# Patient Record
Sex: Female | Born: 1935 | Race: White | Hispanic: No | Smoking: Never smoker
Health system: Southern US, Community
[De-identification: ages and names within clinical notes are randomized; demographics above are authoritative.]

## PROBLEM LIST (undated history)

## (undated) DIAGNOSIS — J45909 Unspecified asthma, uncomplicated: Secondary | ICD-10-CM

## (undated) DIAGNOSIS — N8110 Cystocele, unspecified: Secondary | ICD-10-CM

## (undated) DIAGNOSIS — E119 Type 2 diabetes mellitus without complications: Secondary | ICD-10-CM

## (undated) DIAGNOSIS — H9190 Unspecified hearing loss, unspecified ear: Secondary | ICD-10-CM

## (undated) DIAGNOSIS — F32A Depression, unspecified: Secondary | ICD-10-CM

## (undated) DIAGNOSIS — E559 Vitamin D deficiency, unspecified: Secondary | ICD-10-CM

## (undated) DIAGNOSIS — F329 Major depressive disorder, single episode, unspecified: Secondary | ICD-10-CM

## (undated) DIAGNOSIS — I2510 Atherosclerotic heart disease of native coronary artery without angina pectoris: Secondary | ICD-10-CM

## (undated) DIAGNOSIS — K589 Irritable bowel syndrome without diarrhea: Secondary | ICD-10-CM

## (undated) DIAGNOSIS — T7840XA Allergy, unspecified, initial encounter: Secondary | ICD-10-CM

## (undated) DIAGNOSIS — I872 Venous insufficiency (chronic) (peripheral): Secondary | ICD-10-CM

## (undated) DIAGNOSIS — M1990 Unspecified osteoarthritis, unspecified site: Secondary | ICD-10-CM

## (undated) DIAGNOSIS — F411 Generalized anxiety disorder: Secondary | ICD-10-CM

## (undated) DIAGNOSIS — J309 Allergic rhinitis, unspecified: Secondary | ICD-10-CM

## (undated) DIAGNOSIS — I10 Essential (primary) hypertension: Secondary | ICD-10-CM

## (undated) DIAGNOSIS — E785 Hyperlipidemia, unspecified: Secondary | ICD-10-CM

## (undated) DIAGNOSIS — J42 Unspecified chronic bronchitis: Secondary | ICD-10-CM

## (undated) HISTORY — DX: Unspecified hearing loss, unspecified ear: H91.90

## (undated) HISTORY — DX: Unspecified chronic bronchitis: J42

## (undated) HISTORY — DX: Allergic rhinitis, unspecified: J30.9

## (undated) HISTORY — DX: Irritable bowel syndrome, unspecified: K58.9

## (undated) HISTORY — DX: Allergy, unspecified, initial encounter: T78.40XA

## (undated) HISTORY — DX: Type 2 diabetes mellitus without complications: E11.9

## (undated) HISTORY — DX: Generalized anxiety disorder: F41.1

## (undated) HISTORY — DX: Atherosclerotic heart disease of native coronary artery without angina pectoris: I25.10

## (undated) HISTORY — DX: Hyperlipidemia, unspecified: E78.5

## (undated) HISTORY — DX: Major depressive disorder, single episode, unspecified: F32.9

## (undated) HISTORY — DX: Essential (primary) hypertension: I10

## (undated) HISTORY — DX: Unspecified asthma, uncomplicated: J45.909

## (undated) HISTORY — DX: Vitamin D deficiency, unspecified: E55.9

## (undated) HISTORY — DX: Depression, unspecified: F32.A

## (undated) HISTORY — DX: Venous insufficiency (chronic) (peripheral): I87.2

## (undated) HISTORY — DX: Unspecified osteoarthritis, unspecified site: M19.90

---

## 1959-01-24 HISTORY — PX: DILATION AND CURETTAGE OF UTERUS: SHX78

## 1990-05-25 HISTORY — PX: OTHER SURGICAL HISTORY: SHX169

## 1995-05-26 HISTORY — PX: CHOLECYSTECTOMY: SHX55

## 2000-06-23 ENCOUNTER — Encounter: Payer: Self-pay | Admitting: Internal Medicine

## 2000-07-13 ENCOUNTER — Encounter: Payer: Self-pay | Admitting: Internal Medicine

## 2003-08-16 ENCOUNTER — Encounter: Admission: RE | Admit: 2003-08-16 | Discharge: 2003-11-14 | Payer: Self-pay | Admitting: Pulmonary Disease

## 2003-12-06 ENCOUNTER — Encounter: Admission: RE | Admit: 2003-12-06 | Discharge: 2004-03-05 | Payer: Self-pay | Admitting: Pulmonary Disease

## 2004-06-27 ENCOUNTER — Ambulatory Visit: Payer: Self-pay | Admitting: Pulmonary Disease

## 2004-08-11 ENCOUNTER — Ambulatory Visit: Payer: Self-pay | Admitting: Pulmonary Disease

## 2005-02-13 ENCOUNTER — Ambulatory Visit: Payer: Self-pay | Admitting: Pulmonary Disease

## 2005-08-21 ENCOUNTER — Ambulatory Visit: Payer: Self-pay | Admitting: Pulmonary Disease

## 2006-02-17 ENCOUNTER — Ambulatory Visit: Payer: Self-pay | Admitting: Pulmonary Disease

## 2006-08-18 ENCOUNTER — Ambulatory Visit: Payer: Self-pay | Admitting: Pulmonary Disease

## 2006-08-18 LAB — CONVERTED CEMR LAB
AST: 20 units/L (ref 0–37)
Alkaline Phosphatase: 62 units/L (ref 39–117)
Basophils Relative: 0.8 % (ref 0.0–1.0)
Eosinophils Absolute: 0.5 10*3/uL (ref 0.0–0.6)
Eosinophils Relative: 7.5 % — ABNORMAL HIGH (ref 0.0–5.0)
GFR calc Af Amer: 107 mL/min
Glucose, Bld: 131 mg/dL — ABNORMAL HIGH (ref 70–99)
Hemoglobin: 13.2 g/dL (ref 12.0–15.0)
Hgb A1c MFr Bld: 6.3 % — ABNORMAL HIGH (ref 4.6–6.0)
LDL Cholesterol: 88 mg/dL (ref 0–99)
Monocytes Absolute: 0.6 10*3/uL (ref 0.2–0.7)
Neutro Abs: 3.3 10*3/uL (ref 1.4–7.7)
Potassium: 4.2 meq/L (ref 3.5–5.1)
RDW: 12.8 % (ref 11.5–14.6)
Total Bilirubin: 0.6 mg/dL (ref 0.3–1.2)
Total CHOL/HDL Ratio: 3.7
Triglycerides: 178 mg/dL — ABNORMAL HIGH (ref 0–149)
VLDL: 36 mg/dL (ref 0–40)

## 2007-02-17 ENCOUNTER — Ambulatory Visit: Payer: Self-pay | Admitting: Pulmonary Disease

## 2007-02-17 LAB — CONVERTED CEMR LAB
Calcium: 9.3 mg/dL (ref 8.4–10.5)
Chloride: 104 meq/L (ref 96–112)
GFR calc Af Amer: 91 mL/min
GFR calc non Af Amer: 75 mL/min
HDL: 43.7 mg/dL (ref >39.0)
Hgb A1c MFr Bld: 6.3 % — ABNORMAL HIGH (ref 4.6–6.0)
LDL Cholesterol: 79 mg/dL (ref 0–99)
Sodium: 142 meq/L (ref 135–145)
VLDL: 20 mg/dL (ref 0–40)

## 2007-05-26 HISTORY — PX: OTHER SURGICAL HISTORY: SHX169

## 2007-06-03 ENCOUNTER — Telehealth: Payer: Self-pay | Admitting: Pulmonary Disease

## 2007-06-09 ENCOUNTER — Telehealth (INDEPENDENT_AMBULATORY_CARE_PROVIDER_SITE_OTHER): Payer: Self-pay | Admitting: *Deleted

## 2007-07-05 DIAGNOSIS — K589 Irritable bowel syndrome without diarrhea: Secondary | ICD-10-CM

## 2007-07-05 DIAGNOSIS — I872 Venous insufficiency (chronic) (peripheral): Secondary | ICD-10-CM | POA: Insufficient documentation

## 2007-07-05 DIAGNOSIS — E785 Hyperlipidemia, unspecified: Secondary | ICD-10-CM

## 2007-07-05 DIAGNOSIS — H9190 Unspecified hearing loss, unspecified ear: Secondary | ICD-10-CM | POA: Insufficient documentation

## 2007-07-05 DIAGNOSIS — I10 Essential (primary) hypertension: Secondary | ICD-10-CM | POA: Insufficient documentation

## 2007-07-05 DIAGNOSIS — I2510 Atherosclerotic heart disease of native coronary artery without angina pectoris: Secondary | ICD-10-CM

## 2007-07-05 DIAGNOSIS — F411 Generalized anxiety disorder: Secondary | ICD-10-CM | POA: Insufficient documentation

## 2007-08-30 ENCOUNTER — Ambulatory Visit: Payer: Self-pay | Admitting: Pulmonary Disease

## 2007-08-30 DIAGNOSIS — J42 Unspecified chronic bronchitis: Secondary | ICD-10-CM | POA: Insufficient documentation

## 2007-08-30 DIAGNOSIS — J309 Allergic rhinitis, unspecified: Secondary | ICD-10-CM | POA: Insufficient documentation

## 2007-09-02 DIAGNOSIS — E118 Type 2 diabetes mellitus with unspecified complications: Secondary | ICD-10-CM | POA: Insufficient documentation

## 2007-09-02 LAB — CONVERTED CEMR LAB
ALT: 18 units/L (ref 0–35)
Albumin: 3.9 g/dL (ref 3.5–5.2)
BUN: 10 mg/dL (ref 6–23)
Bilirubin, Direct: 0.1 mg/dL (ref 0.0–0.3)
Chloride: 104 meq/L (ref 96–112)
Creatinine, Ser: 0.9 mg/dL (ref 0.4–1.2)
Eosinophils Absolute: 0.4 10*3/uL (ref 0.0–0.7)
GFR calc non Af Amer: 66 mL/min
HDL: 42.3 mg/dL (ref >39.0)
Hemoglobin: 13.7 g/dL (ref 12.0–15.0)
Hgb A1c MFr Bld: 6.5 % — ABNORMAL HIGH (ref 4.6–6.0)
MCHC: 32.9 g/dL (ref 30.0–36.0)
Monocytes Relative: 10 % (ref 3.0–12.0)
Platelets: 249 10*3/uL (ref 150–400)
Potassium: 3.8 meq/L (ref 3.5–5.1)
RBC: 4.5 M/uL (ref 3.87–5.11)
RDW: 12.8 % (ref 11.5–14.6)
Sodium: 141 meq/L (ref 135–145)
Total Bilirubin: 0.5 mg/dL (ref 0.3–1.2)
Total Protein: 7.2 g/dL (ref 6.0–8.3)
Triglycerides: 95 mg/dL (ref 0–149)

## 2008-02-27 ENCOUNTER — Ambulatory Visit: Payer: Self-pay | Admitting: Pulmonary Disease

## 2008-02-27 DIAGNOSIS — M1990 Unspecified osteoarthritis, unspecified site: Secondary | ICD-10-CM | POA: Insufficient documentation

## 2008-02-27 LAB — CONVERTED CEMR LAB
AST: 19 units/L (ref 0–37)
Albumin: 4.1 g/dL (ref 3.5–5.2)
Bilirubin, Direct: 0.2 mg/dL (ref 0.0–0.3)
CO2: 31 meq/L (ref 19–32)
Cholesterol: 166 mg/dL (ref 0–200)
Glucose, Bld: 131 mg/dL — ABNORMAL HIGH (ref 70–99)
Hgb A1c MFr Bld: 6.6 % — ABNORMAL HIGH (ref 4.6–6.0)
LDL Cholesterol: 94 mg/dL (ref 0–99)
Potassium: 4.2 meq/L (ref 3.5–5.1)
Sodium: 142 meq/L (ref 135–145)
Total Bilirubin: 0.5 mg/dL (ref 0.3–1.2)
Total CHOL/HDL Ratio: 3.6
Triglycerides: 134 mg/dL (ref 0–149)

## 2008-08-27 ENCOUNTER — Ambulatory Visit: Payer: Self-pay | Admitting: Pulmonary Disease

## 2008-09-02 LAB — CONVERTED CEMR LAB
AST: 20 units/L (ref 0–37)
Albumin: 4.1 g/dL (ref 3.5–5.2)
Alkaline Phosphatase: 67 units/L (ref 39–117)
BUN: 17 mg/dL (ref 6–23)
Basophils Absolute: 0.1 10*3/uL (ref 0.0–0.1)
Basophils Relative: 0.8 % (ref 0.0–3.0)
Bilirubin, Direct: 0.1 mg/dL (ref 0.0–0.3)
CO2: 29 meq/L (ref 19–32)
Chloride: 103 meq/L (ref 96–112)
Cholesterol: 203 mg/dL — ABNORMAL HIGH (ref 0–200)
Creatinine, Ser: 0.9 mg/dL (ref 0.4–1.2)
Eosinophils Absolute: 0.3 10*3/uL (ref 0.0–0.7)
HCT: 39.1 % (ref 36.0–46.0)
Hemoglobin: 13.8 g/dL (ref 12.0–15.0)
MCV: 91.9 fL (ref 78.0–100.0)
Monocytes Relative: 8.1 % (ref 3.0–12.0)
Neutro Abs: 4.2 10*3/uL (ref 1.4–7.7)
Neutrophils Relative %: 61.8 % (ref 43.0–77.0)
Platelets: 241 10*3/uL (ref 150.0–400.0)
Potassium: 3.6 meq/L (ref 3.5–5.1)
RDW: 13.4 % (ref 11.5–14.6)
VLDL: 28.2 mg/dL (ref 0.0–40.0)
WBC: 6.9 10*3/uL (ref 4.5–10.5)

## 2009-02-25 ENCOUNTER — Ambulatory Visit: Payer: Self-pay | Admitting: Pulmonary Disease

## 2009-02-25 DIAGNOSIS — E559 Vitamin D deficiency, unspecified: Secondary | ICD-10-CM | POA: Insufficient documentation

## 2009-02-25 LAB — CONVERTED CEMR LAB
ALT: 11 units/L (ref 0–35)
AST: 16 units/L (ref 0–37)
Albumin: 3.8 g/dL (ref 3.5–5.2)
CO2: 30 meq/L (ref 19–32)
Calcium: 9.3 mg/dL (ref 8.4–10.5)
Chloride: 109 meq/L (ref 96–112)
Creatinine, Ser: 0.8 mg/dL (ref 0.4–1.2)
Glucose, Bld: 120 mg/dL — ABNORMAL HIGH (ref 70–99)
Hgb A1c MFr Bld: 6.5 % (ref 4.6–6.5)
LDL Cholesterol: 107 mg/dL — ABNORMAL HIGH (ref 0–99)
Potassium: 4.1 meq/L (ref 3.5–5.1)
Sodium: 142 meq/L (ref 135–145)
Total Protein: 7 g/dL (ref 6.0–8.3)
VLDL: 27.8 mg/dL (ref 0.0–40.0)

## 2009-08-26 ENCOUNTER — Ambulatory Visit: Payer: Self-pay | Admitting: Pulmonary Disease

## 2009-08-26 ENCOUNTER — Telehealth: Payer: Self-pay | Admitting: Internal Medicine

## 2009-08-26 IMAGING — CR DG CHEST 2V
4 series · 4 of 4 positions shown · non-contrast
Comparison: Report from lumbar chest radiograph dated 01/30/1997

CLINICAL DATA: Bronchitis.  Hypertension.

CHEST - 2 VIEW

[view not recorded (1 of 4)]
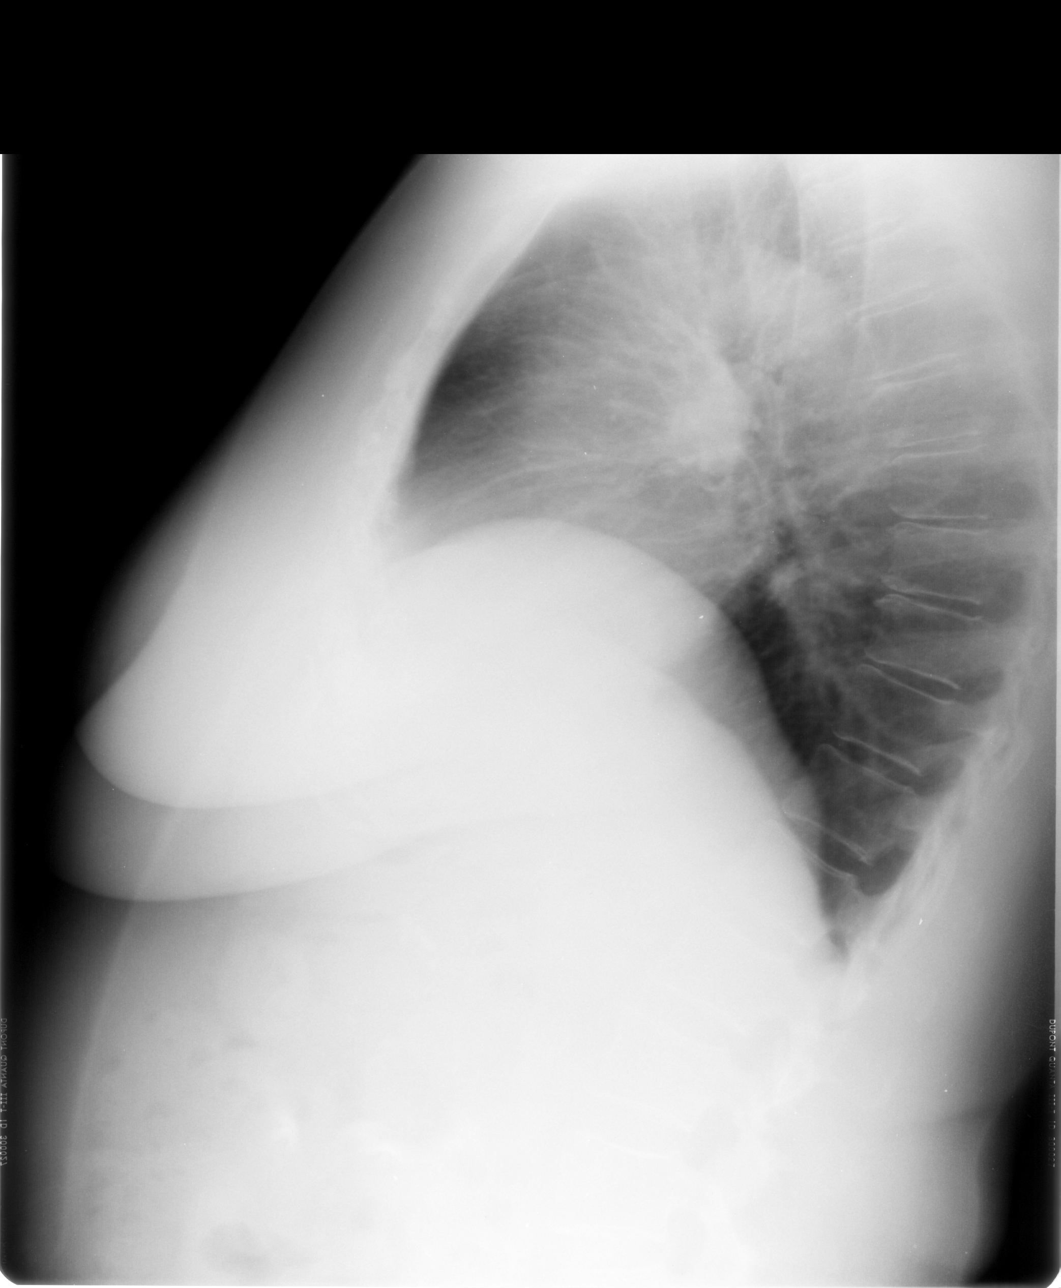

[view not recorded (2 of 4)]
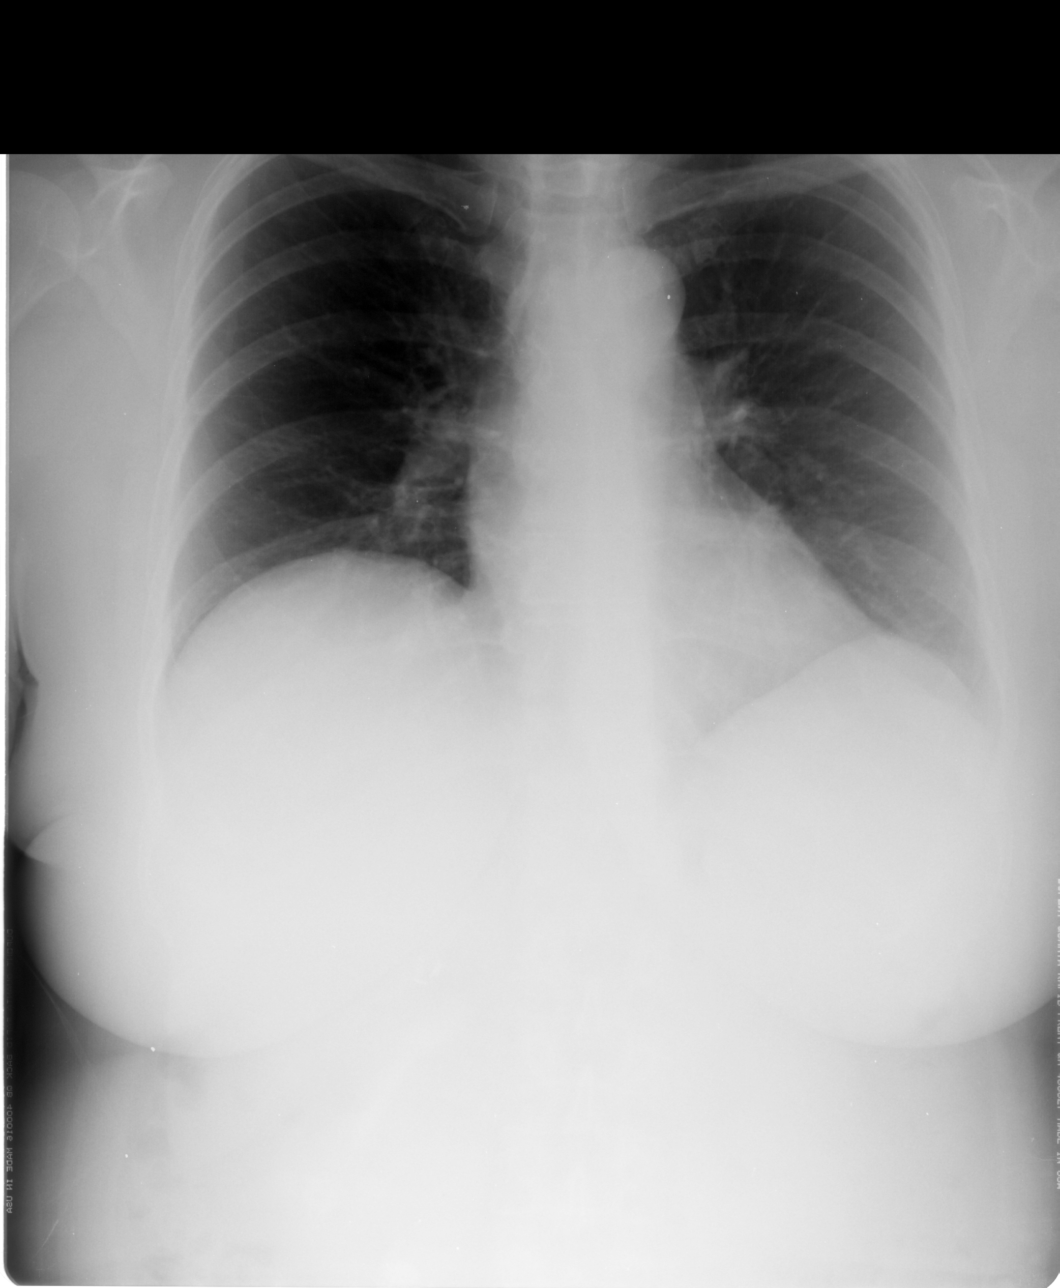

[view not recorded (3 of 4)]
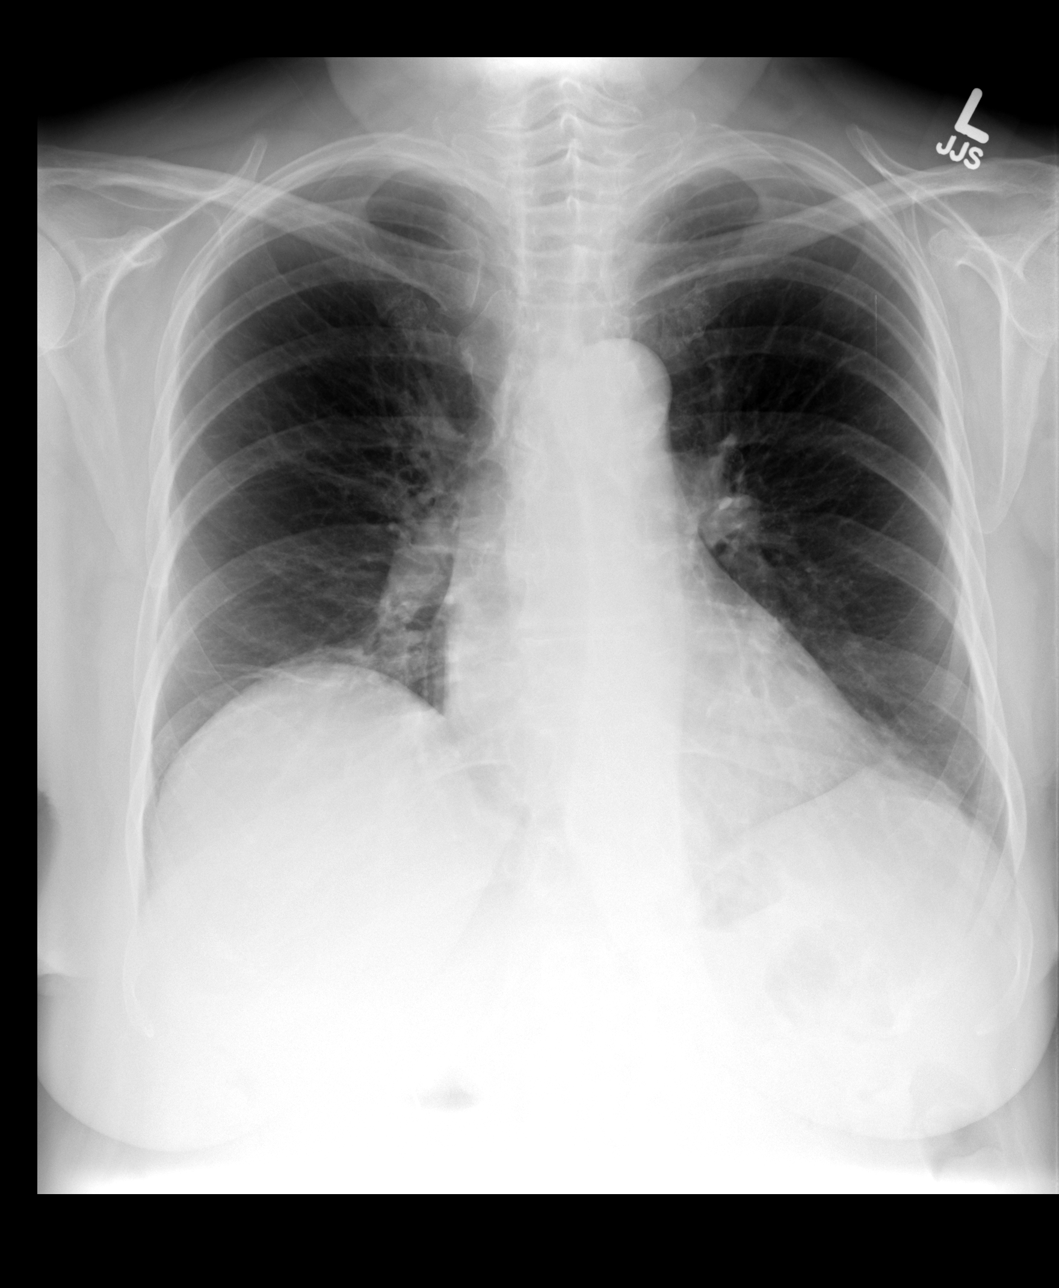

[view not recorded (4 of 4)]
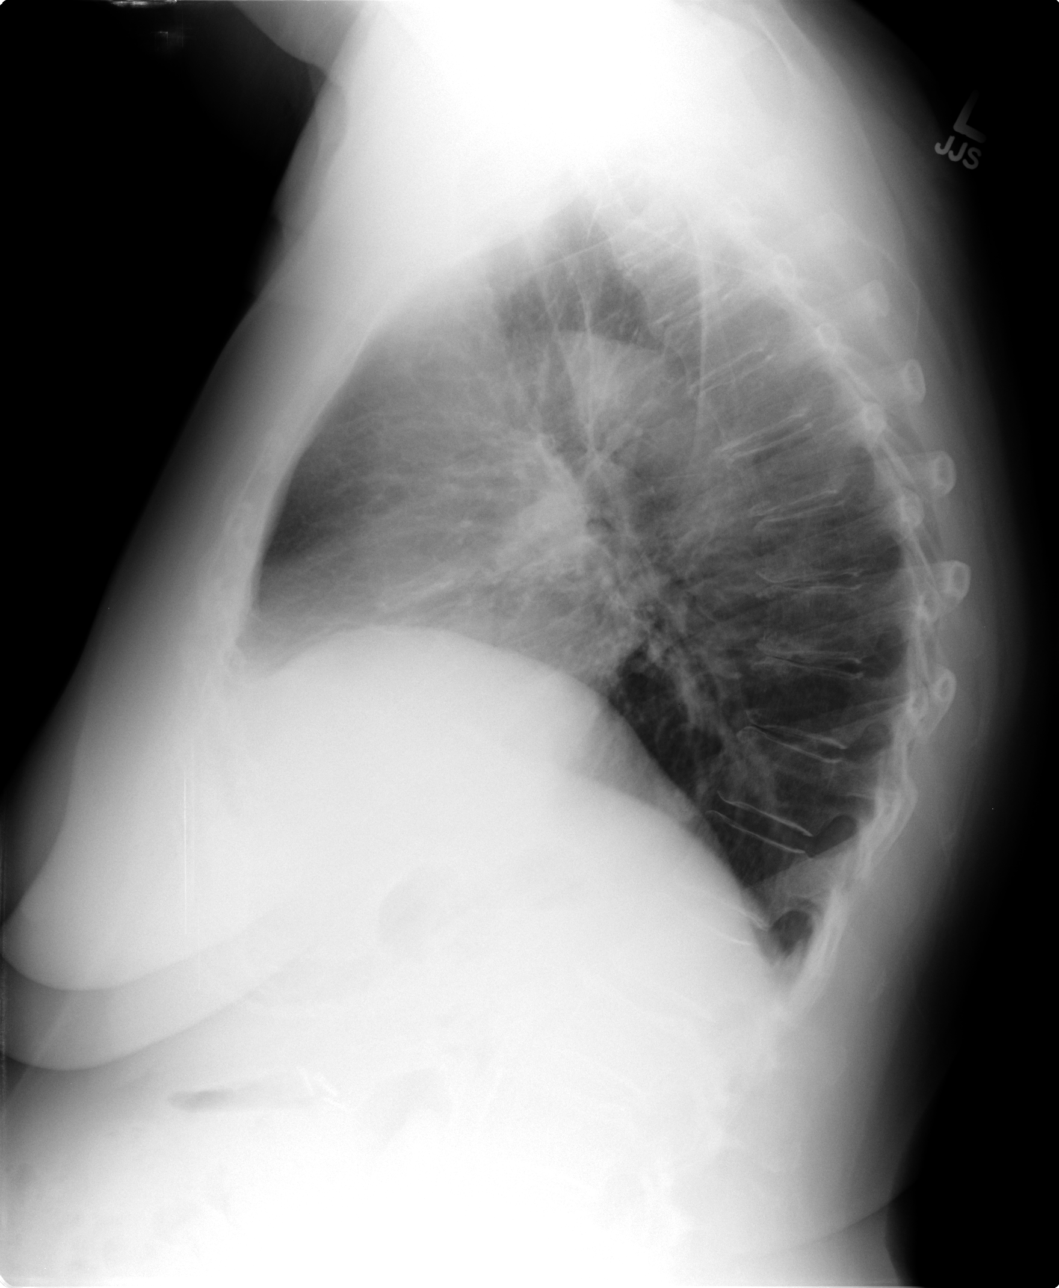

[4 of 4 positions shown; findings below may reference images not displayed]

FINDINGS: Linear opacity in the right middle lobe is compatible
with subsegmental atelectasis or scarring.  Similar left lower lobe
findings noted.

Low lung volumes are present, causing crowding of the pulmonary
vasculature.  Airway thickening may reflect bronchitis or reactive
airways disease.  No airspace opacity is identified to suggest
bacterial pneumonia pattern.
IMPRESSION: 1. Airway thickening may reflect bronchitis or reactive airways
disease.  No airspace opacity is identified to suggest bacterial
pneumonia pattern.
2.  Right middle lobe and left lower lobe subsegmental atelectasis
or scarring.

## 2009-09-01 LAB — CONVERTED CEMR LAB
Alkaline Phosphatase: 64 units/L (ref 39–117)
Basophils Absolute: 0.1 10*3/uL (ref 0.0–0.1)
Basophils Relative: 0.7 % (ref 0.0–3.0)
Bilirubin, Direct: 0 mg/dL (ref 0.0–0.3)
Calcium: 9.6 mg/dL (ref 8.4–10.5)
Creatinine, Ser: 0.8 mg/dL (ref 0.4–1.2)
Direct LDL: 129.5 mg/dL
Eosinophils Relative: 5.2 % — ABNORMAL HIGH (ref 0.0–5.0)
Hemoglobin: 13.7 g/dL (ref 12.0–15.0)
Lymphocytes Relative: 31.1 % (ref 12.0–46.0)
MCHC: 34.2 g/dL (ref 30.0–36.0)
MCV: 92.6 fL (ref 78.0–100.0)
Monocytes Relative: 8.9 % (ref 3.0–12.0)
Platelets: 286 10*3/uL (ref 150.0–400.0)
RDW: 14.1 % (ref 11.5–14.6)
Total Bilirubin: 0.4 mg/dL (ref 0.3–1.2)
Triglycerides: 225 mg/dL — ABNORMAL HIGH (ref 0.0–149.0)
VLDL: 45 mg/dL — ABNORMAL HIGH (ref 0.0–40.0)
Vit D, 25-Hydroxy: 38 ng/mL (ref 30–89)
WBC: 8.6 10*3/uL (ref 4.5–10.5)

## 2009-09-10 DIAGNOSIS — K5730 Diverticulosis of large intestine without perforation or abscess without bleeding: Secondary | ICD-10-CM | POA: Insufficient documentation

## 2009-09-16 ENCOUNTER — Ambulatory Visit: Payer: Self-pay | Admitting: Internal Medicine

## 2009-09-17 ENCOUNTER — Telehealth: Payer: Self-pay | Admitting: Internal Medicine

## 2010-02-24 ENCOUNTER — Ambulatory Visit: Payer: Self-pay | Admitting: Pulmonary Disease

## 2010-03-02 LAB — CONVERTED CEMR LAB
AST: 14 units/L (ref 0–37)
Albumin: 4.4 g/dL (ref 3.5–5.2)
Chloride: 99 meq/L (ref 96–112)
Cholesterol: 185 mg/dL (ref 0–200)
Glucose, Bld: 115 mg/dL — ABNORMAL HIGH (ref 70–99)
Total Bilirubin: 0.4 mg/dL (ref 0.3–1.2)
Triglycerides: 232 mg/dL — ABNORMAL HIGH (ref 0.0–149.0)
VLDL: 46.4 mg/dL — ABNORMAL HIGH (ref 0.0–40.0)

## 2010-04-26 ENCOUNTER — Emergency Department (HOSPITAL_COMMUNITY)
Admission: EM | Admit: 2010-04-26 | Discharge: 2010-04-26 | Payer: Self-pay | Source: Home / Self Care | Admitting: Emergency Medicine
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
## 2010-06-24 NOTE — Assessment & Plan Note (Signed)
Summary: 6 months/apc   CC:  6 month ROV & review of mult medical problems...  History of Present Illness: [name] here for a follow up visit...she has mult med problems as noted below...    ~  Apr10:  she has had a good 6 months and notes that she still works 4d/wk at [employer]... she would like refills of her meds for 2010...   ~  February 25, 2009:  she continues clinically stable- her 74 y/o [place] dog passed away recently; she is getting new hearing aides paid by Medicare she says... no new complaints or concerns, OK 2010 Flu shot today...   ~  August 26, 2009:  she is planning cataract surg 5/11 by [facility]... her new hearing aides are being adjusted... BP controlled on meds;  denies CP/ palpit/ SOB/ etc;  Chol fair on top dose of Simva; & BS OK on Metformin> she needs better diet & get weight down (see below)...    Current Problems:   DECREASED HEARING (ICD-389.9) - she has digital hearing aides bilat...  ALLERGIC RHINITIS (ICD-477.9) - on ZYRTEK daily & FLONASE Qhs...  Hx of BRONCHITIS, RECURRENT (ICD-491.9) - she had URI & went to [facility] recently w/ "virus" diagnosed...  HYPERTENSION (ICD-401.9) - on PROCARDIA XL 60mg /d & LASIX 40mg /d... BP=134/78 today, and feeling well... BP's at home are similar range... denies HA, fatigue, visual changes, CP, palipit, dizziness, syncope, dyspnea, edema, etc...  ~  CXR 4/11 showed mild basilar atelectasis, NAD...  CORONARY ARTERY DISEASE (ICD-414.00) - on ASA 81mg /d... no CP, palpit, or change in SOB.  ~  cath 1989 by DrStuckey showed non-obstructive dis w/ 40-50% LAD lesion, otherw norm...  ~  EKG shows NSR, NSSTTWA...  VENOUS INSUFFICIENCY (ICD-459.81) - she knows to elim sodium, elevate legs, wear support hose, & take the LASIX 40mg /d...  HYPERLIPIDEMIA (ICD-272.4) - on SIMVASTATIN 80mg /d now...  ~  FLP 9/08 on Vytorin 10-80 showed TChol 143, TG 100, HDL 44, LDL 79  ~  FLP 4/09 on Vytorin still showed TChol 153, TG 95, HDL 42, LDL 92...  changed to Simva80 for $$.  ~  FLP 10/09 still on the Vytorin showed TChol 166, TG 134, HDL 46, LDL 94  ~  FLP 4/10 on Simva80 showed TChol 203, TG 141, HDL 47, LDL 131... rec> same + diet!  ~  FLP 10/10 on Simva80 showed TChol 176, TG 139, HDL 41, LDL 107  ~  FLP 4/11 showed TChol 213, TG 225, HDL 50, LDL 130... consider change med, add zetia, for now= BETTER DIET!  DIABETES MELLITUS (ICD-250.00) - on METFORMIN 500mg /d and diet, but weight is 186# today...Marland KitchenMarland KitchenMarland Kitchen  ~  labs 9/08 showed BS=121, HgA1c=6.3  ~  labs 4/09 showed BS= 111, HgA1c= 6.5.Marland KitchenMarland Kitchen continue same Rx.  ~  labs 9/09 (wt=192#) showed BS= 131, HgA1c= 6.6.Marland Kitchen. same.  ~  Labs 4/10 (wt=190#) showed BS= 122, A1c= 6.5  ~  labs 10/10 (wt=194#) showed BS= 120, A1c= 6.5  ~  labs 4/11 (wt=186#) showed BS= 124, A1c= 6.6  IRRITABLE BOWEL SYNDROME (ICD-564.1) - she uses BENTYL 20mg  as needed... she tells me she wears depends for diarrhea prob & due for f/u w/ DrDBrodie...  ~  last colonoscopy 2/02 by DrBrodie showed divertics only... f/u planned 12yrs.  DEGENERATIVE JOINT DISEASE (ICD-715.90) - on MOBIC 7.5mg  Prn...  VITAMIN D DEFICIENCY (ICD-268.9) - on Vit D 50000 u daily...  ~  labs 4/10 showed Vit D level = 6... therefore started on Vit  D 50000 u weekly.  ~  labs 4/11 showed Vit D level = 38... OK to change to 2000 u daily.  ANXIETY (ICD-300.00) - on ALPRAZOLAM 0.5mg  Prn... she's under alot of stress w/ her husb and a 34 y/o nephew dx w/ Marfan's, s/p valve surg, needs heart transplant...  GYN = DrMcPhail w/ BMD 1/06 @ [facility] showing norm w/ TScores -0.3 to -0.8.Marland KitchenMarland Kitchen   Allergies: 1)  Penicillin G Potassium (Penicillin G Potassium)  Comments:  Nurse/Medical Assistant: The patient's medications and allergies were reviewed with the patient and were updated in the Medication and Allergy Lists.  Past History:  Past Medical History:  DECREASED HEARING (ICD-389.9) ALLERGIC RHINITIS (ICD-477.9) Hx of BRONCHITIS, RECURRENT  (ICD-491.9) HYPERTENSION (ICD-401.9) CORONARY ARTERY DISEASE (ICD-414.00) VENOUS INSUFFICIENCY (ICD-459.81) HYPERLIPIDEMIA (ICD-272.4) DIABETES MELLITUS (ICD-250.00) IRRITABLE BOWEL SYNDROME (ICD-564.1) DEGENERATIVE JOINT DISEASE (ICD-715.90) VITAMIN D DEFICIENCY (ICD-268.9) ANXIETY (ICD-300.00)  Past Surgical History: S/P cholecystectomy by DrPrice in 1996  Family History: Reviewed history from 02/27/2008 and no changes required. mother deceased age 66 from emphysema father deceased age 60 from heart attack 1 sibling alive age 78  hx of breast cancer grandmother hx of pancreatic cancer aunt has hx of lung cancer and skin cancer  Social History: Reviewed history from 02/27/2008 and no changes required. non smoker exercises every day caffeine use:  2 times per week no etoh married 3 children  Review of Systems       The patient complains of fatigue, decreased hearing, dyspnea on exertion, diarrhea, change in bowel habits, gas/bloating, joint pain, stiffness, and anxiety.  The patient denies fever, chills, sweats, anorexia, weakness, malaise, weight loss, sleep disorder, blurring, diplopia, eye irritation, eye discharge, vision loss, eye pain, photophobia, earache, ear discharge, tinnitus, nasal congestion, nosebleeds, sore throat, hoarseness, chest pain, palpitations, syncope, orthopnea, PND, peripheral edema, cough, dyspnea at rest, excessive sputum, hemoptysis, wheezing, pleurisy, nausea, vomiting, constipation, abdominal pain, melena, hematochezia, jaundice, indigestion/heartburn, dysphagia, odynophagia, dysuria, hematuria, urinary frequency, urinary hesitancy, nocturia, incontinence, back pain, joint swelling, muscle cramps, muscle weakness, arthritis, sciatica, restless legs, leg pain at night, leg pain with exertion, rash, itching, dryness, suspicious lesions, paralysis, paresthesias, seizures, tremors, vertigo, transient blindness, frequent falls, frequent headaches, difficulty  walking, depression, memory loss, confusion, cold intolerance, heat intolerance, polydipsia, polyphagia, polyuria, unusual weight change, abnormal bruising, bleeding, enlarged lymph nodes, urticaria, allergic rash, hay fever, and recurrent infections.    Vital Signs:  Patient profile:   76 year old female Height:      65 inches Weight:      185.38 pounds BMI:     30.96 O2 Sat:      97 % on Room air Temp:     98.4 degrees F oral Pulse rate:   67 / minute BP sitting:   134 / 78  (left arm) Cuff size:   regular  Vitals Entered By: Randell Loop CMA (August 26, 2009 10:27 AM)  O2 Sat at Rest %:  97 O2 Flow:  Room air CC: 6 month ROV & review of mult medical problems.. Is Patient Diabetic? No Pain Assessment Patient in pain? no      Comments NO CHANGES IN MEDS TODAY   Physical Exam  Additional Exam:  WD, Overweight, [name] in NAD... GENERAL:  Alert & oriented; pleasant & cooperative... HEENT:  [place]/AT, EOM-wnl, PERRLA, EACs-clear, TMs-wnl, NOSE-clear, THROAT-clear & wnl. NECK:  Supple w/ fairROM; no JVD; normal carotid impulses w/o bruits; no thyromegaly or nodules palpated; no lymphadenopathy. CHEST:  Clear to P &  A; without wheezes/ rales/ or rhonchi. HEART:  Regular Rhythm; without murmurs/ rubs/ or gallops. ABDOMEN:  Soft & nontender; normal bowel sounds; no organomegaly or masses detected. EXT: without deformities, mild arthritic changes; no varicose veins/ +venous insuffic/ 1+edema. NEURO:  CN's intact;  no focal neuro deficits... DERM:  chronic dry skin dermatitis on legs...     MISC. Report  Procedure date:  08/26/2009  Findings:      DATA REVIEWED:  CXR, EKG, LABS- CBC, CMet, TSH, FLP, A1c... SN   Impression & Recommendations:  Problem # 1:  Hx of BRONCHITIS, RECURRENT (ICD-491.9) Resp stable & we discussed stategies to avoid URIs in the future... Orders: T-2 View CXR (71020TC)  Problem # 2:  HYPERTENSION (ICD-401.9) Controlled-  same meds. Her updated  medication list for this problem includes:    Procardia Xl 60 Mg Tb24 (Nifedipine) .Marland Kitchen... Take 1 tablet by mouth once a day    Furosemide 40 Mg Tabs (Furosemide) .Marland Kitchen... Take 1 tablet by mouth once a day  Orders: 12 Lead EKG (12 Lead EKG) T-Vitamin D (25-Hydroxy) (16109-60454) T-2 View CXR (71020TC) TLB-Lipid Panel (80061-LIPID) TLB-BMP (Basic Metabolic Panel-BMET) (80048-METABOL) TLB-Hepatic/Liver Function Pnl (80076-HEPATIC) TLB-CBC Platelet - w/Differential (85025-CBCD) TLB-TSH (Thyroid Stimulating Hormone) (84443-TSH) TLB-A1C / Hgb A1C (Glycohemoglobin) (83036-A1C)  Problem # 3:  CORONARY ARTERY DISEASE (ICD-414.00) No angina-  she needs to incr activity... Her updated medication list for this problem includes:    Adult Aspirin Low Strength 81 Mg Tbdp (Aspirin) .Marland Kitchen... Take 1 tablet by mouth once a day    Procardia Xl 60 Mg Tb24 (Nifedipine) .Marland Kitchen... Take 1 tablet by mouth once a day    Furosemide 40 Mg Tabs (Furosemide) .Marland Kitchen... Take 1 tablet by mouth once a day  Orders: 12 Lead EKG (12 Lead EKG)  Problem # 4:  VENOUS INSUFFICIENCY (ICD-459.81) Continue Rx & Lasix...  Problem # 5:  HYPERLIPIDEMIA (ICD-272.4) FLP isn't as good- on Simva80 w/ options to change to another statin, add Zetia, but she doesn't want to change- therefore needs much better  diet exercise etc... Her updated medication list for this problem includes:    Simvastatin 80 Mg Tabs (Simvastatin) .Marland Kitchen... Take one tablet by mouth at bedtime  Problem # 6:  DIABETES MELLITUS (ICD-250.00) Same med, better diet, get weight down... Her updated medication list for this problem includes:    Adult Aspirin Low Strength 81 Mg Tbdp (Aspirin) .Marland Kitchen... Take 1 tablet by mouth once a day    Glucophage 500 Mg Tabs (Metformin hcl) .Marland Kitchen... Take 1 tablet by mouth once a day  Problem # 7:  IRRITABLE BOWEL SYNDROME (ICD-564.1) GI is stable but she wants f/u drDBrodie & due for colon soon... Orders: Gastroenterology Referral (GI)  Problem # 8:   DEGENERATIVE JOINT DISEASE (ICD-715.90) She has stayed active, still works at [employer], Catering manager... Her updated medication list for this problem includes:    Adult Aspirin Low Strength 81 Mg Tbdp (Aspirin) .Marland Kitchen... Take 1 tablet by mouth once a day    Mobic 7.5 Mg Tabs (Meloxicam) .Marland Kitchen... Take 1 tablet by mouth once a day as needed for arthritis pain...  Problem # 9:  OTHER MEDICAL PROBLEMS AS NOTED>>>  Complete Medication List: 1)  Zyrtec Allergy 10 Mg Tabs (Cetirizine hcl) .... Take 1 tab daily in am... 2)  Flonase 50 Mcg/act Susp (Fluticasone propionate) .... 2 sprays in each nostril at bedtime.Marland KitchenMarland Kitchen 3)  Adult Aspirin Low Strength 81 Mg Tbdp (Aspirin) .... Take 1 tablet by mouth once a day 4)  Procardia Xl 60 Mg Tb24 (Nifedipine) .... Take 1 tablet by mouth once a day 5)  Furosemide 40 Mg Tabs (Furosemide) .... Take 1 tablet by mouth once a day 6)  Simvastatin 80 Mg Tabs (Simvastatin) .... Take one tablet by mouth at bedtime 7)  Glucophage 500 Mg Tabs (Metformin hcl) .... Take 1 tablet by mouth once a day 8)  Bentyl 20 Mg Tabs (Dicyclomine hcl) .... Take one tablet by mouth every 6 hours as needed for abd cramping 9)  Mobic 7.5 Mg Tabs (Meloxicam) .... Take 1 tablet by mouth once a day as needed for arthritis pain... 10)  Alprazolam 0.5 Mg Tabs (Alprazolam) .... Take 1/2 to 1 tab by mouth three times a day as needed for nerves... 11)  Vitamin D 13244 Unit Caps (Ergocalciferol) .... Take one tablet by mouth once weekly  Other Orders: Prescription Created Electronically [phone])  Patient Instructions: 1)  Today we updated your med list- see below.... 2)  We refilled your meds for 2011... 3)  Today we did your follow up CXR, EKG, and Fasting blood work... 4)  please call the "phone tree" in a few days for your lab results.Marland KitchenMarland Kitchen 5)  We will arrange for a follow up appt w/ Dr Lina Sar ... 6)  Call for any questions.Marland KitchenMarland Kitchen 7)  Please schedule a follow-up appointment in 6 months. Prescriptions: VITAMIN D  50000 UNIT CAPS (ERGOCALCIFEROL) take one tablet by mouth once weekly  #4 x prn   Entered and Authorized by:   Michele Mcalpine MD   Signed by:   Michele Mcalpine MD on 08/26/2009   Method used:   Print then Give to Patient   RxID:   [id number] ALPRAZOLAM 0.5 MG TABS (ALPRAZOLAM) take 1/2 to 1 tab by mouth three times a day as needed for nerves...  #90 x 6   Entered and Authorized by:   Michele Mcalpine MD   Signed by:   Michele Mcalpine MD on 08/26/2009   Method used:   Print then Give to Patient   RxID:   [id number] MOBIC 7.5 MG  TABS (MELOXICAM) Take 1 tablet by mouth once a day as needed for arthritis pain...  #30 x prn   Entered and Authorized by:   Michele Mcalpine MD   Signed by:   Michele Mcalpine MD on 08/26/2009   Method used:   Print then Give to Patient   RxID:   [id number] GLUCOPHAGE 500 MG  TABS (METFORMIN HCL) Take 1 tablet by mouth once a day  #30 x prn   Entered and Authorized by:   Michele Mcalpine MD   Signed by:   Michele Mcalpine MD on 08/26/2009   Method used:   Print then Give to Patient   RxID:   [id number] SIMVASTATIN 80 MG TABS (SIMVASTATIN) take one tablet by mouth at bedtime  #30 x prn   Entered and Authorized by:   Michele Mcalpine MD   Signed by:   Michele Mcalpine MD on 08/26/2009   Method used:   Print then Give to Patient   RxID:   [id number] FUROSEMIDE 40 MG TABS (FUROSEMIDE) Take 1 tablet by mouth once a day  #30 x prn   Entered and Authorized by:   Michele Mcalpine MD   Signed by:   Michele Mcalpine MD on 08/26/2009   Method used:   Print then Give to Patient   RxID:   [id number] PROCARDIA XL 60  MG  TB24 (NIFEDIPINE) Take 1 tablet by mouth once a day  #30 x prn   Entered and Authorized by:   Michele Mcalpine MD   Signed by:   Michele Mcalpine MD on 08/26/2009   Method used:   Print then Give to Patient   RxID:   [id number] FLONASE 50 MCG/ACT  SUSP (FLUTICASONE PROPIONATE) 2 sprays in each nostril at bedtime...  #1 x prn   Entered and Authorized  by:   Michele Mcalpine MD   Signed by:   Michele Mcalpine MD on 08/26/2009   Method used:   Print then Give to Patient   RxID:   [id number]    Card[employer]Perfect ECG  ID: [id number] Patient: Nichole Silva DOB: 03/13/1935 Age: 76 Years Old Sex: Female Race: White Physician: scott nadel Technician: Randell Loop CMA Height: 65 Weight: 185.38 Status: Unconfirmed Past Medical History:  DECREASED HEARING (ICD-389.9) ALLERGIC RHINITIS (ICD-477.9) Hx of BRONCHITIS, RECURRENT (ICD-491.9) HYPERTENSION (ICD-401.9) CORONARY ARTERY DISEASE (ICD-414.00) VENOUS INSUFFICIENCY (ICD-459.81) HYPERLIPIDEMIA (ICD-272.4) DIABETES MELLITUS (ICD-250.00) IRRITABLE BOWEL SYNDROME (ICD-564.1) DEGENERATIVE JOINT DISEASE (ICD-715.90) VITAMIN D DEFICIENCY (ICD-268.9) ANXIETY (ICD-300.00)   Recorded: 08/26/2009 11:40 AM P/PR: 133 ms / 180 ms - Heart rate (maximum exercise) QRS: 100 QT/QTc/QTd: 412 ms / 417 ms / 88 ms - Heart rate (maximum exercise)  P/QRS/T axis: 52 deg / 1 deg / 106 deg - Heart rate (maximum exercise)  Heartrate: 63 bpm  Interpretation:   sinus rhythm  moderate right- and mid-precordial repolarization disturbance, consider ischemia or LV overload   negative T in V2 V3    with small negative T in V4 V5   Abnormal ECG

## 2010-06-24 NOTE — Assessment & Plan Note (Signed)
Summary: 6 months/apc   Primary Care Tupac Jeffus:  Lorin Picket Nadel,MD  CC:  6 month ROV & review of mult medical problems....  History of Present Illness: [name] here for a follow up visit...she has mult med problems as noted below...    ~  Apr10:  she has had a good 6 months and notes that she still works 4d/wk at [employer]... she would like refills of her meds for 2010...   ~  February 25, 2009:  she continues clinically stable- her 74 y/o [place] dog passed away recently; she is getting new hearing aides paid by Medicare she says... no new complaints or concerns, OK 2010 Flu shot today...   ~  August 26, 2009:  she is planning cataract surg 5/11 by [facility]... her new hearing aides are being adjusted... BP controlled on meds;  denies CP/ palpit/ SOB/ etc;  Chol fair on top dose of Simva; & BS OK on Metformin> she needs better diet & get weight down (see below)...   ~  February 24, 2010:  still working regularly at [employer]... she saw DrDBrodie 4/11 for IBS, divertics, fecal incontinence w/ decr rectal sphincter tone- she added Bentyl & [facility] & improved... BP remains under good control;  Lipids improved on current Rx;  & DM stable on 1 Metformin daily... OK Flu shot today...    Current Problems:   DECREASED HEARING (ICD-389.9) - she has digital hearing aides bilat...  ALLERGIC RHINITIS (ICD-477.9) - on ZYRTEK daily & FLONASE Qhs...  Hx of BRONCHITIS, RECURRENT (ICD-491.9) - she had URI & went to [facility] recently w/ "virus" diagnosed...  HYPERTENSION (ICD-401.9) - on PROCARDIA XL 60mg /d & LASIX 40mg /d... BP=128/74 today, and feeling well... BP's at home are similar range... denies HA, fatigue, visual changes, CP, palipit, dizziness, syncope, dyspnea, edema, etc...  ~  CXR 4/11 showed mild basilar atelectasis, NAD...  CORONARY ARTERY DISEASE (ICD-414.00) - on ASA 81mg /d... no CP, palpit, or change in SOB.  ~  cath 1989 by DrStuckey showed non-obstructive dis w/ 40-50% LAD lesion, otherw norm...  ~   EKG shows NSR, NSSTTWA...  VENOUS INSUFFICIENCY (ICD-459.81) - she knows to elim sodium, elevate legs, wear support hose, & take the LASIX 40mg /d...  HYPERLIPIDEMIA (ICD-272.4) - on SIMVASTATIN 80mg /d now...  ~  FLP 9/08 on Vytorin 10-80 showed TChol 143, TG 100, HDL 44, LDL 79  ~  FLP 4/09 on Vytorin still showed TChol 153, TG 95, HDL 42, LDL 92... changed to Simva80 for $$.  ~  FLP 10/09 still on the Vytorin showed TChol 166, TG 134, HDL 46, LDL 94  ~  FLP 4/10 on Simva80 showed TChol 203, TG 141, HDL 47, LDL 131... rec> same + diet!  ~  FLP 10/10 on Simva80 showed TChol 176, TG 139, HDL 41, LDL 107  ~  FLP 4/11 showed TChol 213, TG 225, HDL 50, LDL 130... consider change med, ?add zetia, for now= BETTER DIET!  ~  FLP 10/11 on Simva80+Welchol2 showed TChol 185, TG 232, HDL 53, LDL 103  DIABETES MELLITUS (ICD-250.00) - on METFORMIN 500mg /d and diet, but weight is 181# today (down5#)...Marland KitchenMarland KitchenMarland Kitchen  ~  labs 9/08 showed BS=121, HgA1c=6.3  ~  labs 4/09 showed BS= 111, HgA1c= 6.5.Marland KitchenMarland Kitchen continue same Rx.  ~  labs 9/09 (wt=192#) showed BS= 131, HgA1c= 6.6.Marland Kitchen. same.  ~  Labs 4/10 (wt=190#) showed BS= 122, A1c= 6.5  ~  labs 10/10 (wt=194#) showed BS= 120, A1c= 6.5  ~  labs 4/11 (wt=186#) showed  BS= 124, A1c= 6.6  ~  labs 10/11 (wt=181#) showed BS= 115, A1c= 6.4  IRRITABLE BOWEL SYNDROME (ICD-564.1) - she uses BENTYL 20mg  as needed... she tells me she wears depends for diarrhea prob.  ~  last colonoscopy 2/02 by DrBrodie showed divertics only... f/u planned 19yrs.  ~  4/11:  DrBrodie incr Bentyl to Tid & added WELCHOL 2tabs/d (diarrhea & incontinence improved)  DEGENERATIVE JOINT DISEASE (ICD-715.90) - on MOBIC 7.5mg  Prn...  VITAMIN D DEFICIENCY (ICD-268.9) - on Vit D 50000 u daily...  ~  labs 4/10 showed Vit D level = 6... therefore started on Vit D 50000 u weekly.  ~  labs 4/11 showed Vit D level = 38... OK to change to 2000 u daily. ANXIETY (ICD-300.00) - on ALPRAZOLAM 0.5mg  Prn... she's under alot of  stress w/ her husb and a 41 y/o nephew dx w/ Marfan's, s/p valve surg, needs heart transplant...  GYN = DrMcPhail w/ BMD 1/06 @ [facility] showing norm w/ TScores -0.3 to -0.8.Marland KitchenMarland Kitchen   Preventive Screening-Counseling & Management  Alcohol-Tobacco     Smoking Status: never  Allergies: 1)  Penicillin G Potassium (Penicillin G Potassium)  Comments:  Nurse/Medical Assistant: The patient's medications and allergies were reviewed with the patient and were updated in the Medication and Allergy Lists.  Past History:  Past Medical History: DECREASED HEARING (ICD-389.9) ALLERGIC RHINITIS (ICD-477.9) Hx of BRONCHITIS, RECURRENT (ICD-491.9) HYPERTENSION (ICD-401.9) CORONARY ARTERY DISEASE (ICD-414.00) VENOUS INSUFFICIENCY (ICD-459.81) HYPERLIPIDEMIA (ICD-272.4) DIABETES MELLITUS (ICD-250.00) IRRITABLE BOWEL SYNDROME (ICD-564.1) DEGENERATIVE JOINT DISEASE (ICD-715.90) VITAMIN D DEFICIENCY (ICD-268.9) ANXIETY (ICD-300.00)  Past Surgical History: S/P cholecystectomy by DrPrice in 1996 implant-left elbow D & C  Family History: Reviewed history from 09/16/2009 and no changes required. mother deceased age 58 from emphysema father deceased age 51 from heart attack 1 sibling alive age 67  hx of breast cancer grandmother hx of pancreatic cancer or liver cancer aunt has hx of lung cancer and skin cancer Family History of Heart Disease: Father, Mother Aunt ? colon cancer  had colostomy bag  Social History: Reviewed history from 09/10/2009 and no changes required. non smoker exercises every day caffeine use:  2 times per week married 3 children Alcohol Use - no Illicit Drug Use - no  Review of Systems      See HPI       The patient complains of decreased hearing and dyspnea on exertion.  The patient denies anorexia, fever, weight loss, weight gain, vision loss, hoarseness, chest pain, syncope, peripheral edema, prolonged cough, headaches, hemoptysis, abdominal pain, melena, hematochezia,  severe indigestion/heartburn, hematuria, incontinence, muscle weakness, suspicious skin lesions, transient blindness, difficulty walking, depression, unusual weight change, abnormal bleeding, enlarged lymph nodes, and angioedema.    Vital Signs:  Patient profile:   76 year old female Height:      65 inches Weight:      180.50 pounds O2 Sat:      96 % on Room air Temp:     9.4 degrees F oral Pulse rate:   65 / minute BP sitting:   128 / 74  (left arm) Cuff size:   regular  Vitals Entered By: Randell Loop CMA (February 24, 2010 11:28 AM)  O2 Sat at Rest %:  96 O2 Flow:  Room air CC: 6 month ROV & review of mult medical problems... Is Patient Diabetic? Yes Pain Assessment Patient in pain? no      Comments no changes in meds today   Physical Exam  Additional Exam:  WD, Overweight, [name] in NAD... GENERAL:  Alert & oriented; pleasant & cooperative... HEENT:  [place]/AT, EOM-wnl, PERRLA, EACs-clear, TMs-wnl, NOSE-clear, THROAT-clear & wnl. NECK:  Supple w/ fairROM; no JVD; normal carotid impulses w/o bruits; no thyromegaly or nodules palpated; no lymphadenopathy. CHEST:  Clear to P & A; without wheezes/ rales/ or rhonchi. HEART:  Regular Rhythm; without murmurs/ rubs/ or gallops. ABDOMEN:  Soft & nontender; normal bowel sounds; no organomegaly or masses detected. EXT: without deformities, mild arthritic changes; no varicose veins/ +venous insuffic/ 1+edema. NEURO:  CN's intact;  no focal neuro deficits... DERM:  chronic dry skin dermatitis on legs...    MISC. Report  Procedure date:  02/24/2010  Findings:      BMP (METABOL)   Sodium                    138 mEq/L                   135-145   Potassium                 4.6 mEq/L                   3.5-5.1   Chloride                  99 mEq/L                    96-112   Carbon Dioxide            29 mEq/L                    19-32   Glucose              [H]  115 mg/dL                   60-45   BUN                       11 mg/dL                     4-09   Creatinine                0.9 mg/dL                   8.1-1.9   Calcium                   9.6 mg/dL                   1.4-78.2   GFR                       65.19 mL/min                >60  Hemoglobin A1C (A1C)   Hemoglobin A1C            6.4 %                       4.6-6.5  Lipid Panel (LIPID)   Cholesterol               185 mg/dL                   9-562   Triglycerides        [H]  232.0 mg/dL  0.0-149.0   HDL                       60.45 mg/dL                 >40.98  Cholesterol LDL - Direct                             103.0 mg/dL  Hepatic/Liver Function Panel (HEPATIC)   Total Bilirubin           0.4 mg/dL                   1.1-9.1   Direct Bilirubin          0.1 mg/dL                   4.7-8.2   Alkaline Phosphatase      78 U/L                      39-117   AST                       14 U/L                      0-37   ALT                       9 U/L     Total Protein             7.8 g/dL                    9.5-6.2   Albumin                   4.4 g/dL                    1.3-0.8   Impression & Recommendations:  Problem # 1:  HYPERTENSION (ICD-401.9) Controlled>  continue same meds. Her updated medication list for this problem includes:    Procardia Xl 60 Mg Tb24 (Nifedipine) .Marland Kitchen... Take 1 tablet by mouth once a day    Furosemide 40 Mg Tabs (Furosemide) .Marland Kitchen... Take 1 tablet by mouth once a day  Orders: TLB-BMP (Basic Metabolic Panel-BMET) (80048-METABOL) TLB-A1C / Hgb A1C (Glycohemoglobin) (83036-A1C) TLB-Lipid Panel (80061-LIPID) TLB-Hepatic/Liver Function Pnl (80076-HEPATIC)  Problem # 2:  CORONARY ARTERY DISEASE (ICD-414.00) Stable>  no angina & working 6H per day at [employer]... Her updated medication list for this problem includes:    Adult Aspirin Low Strength 81 Mg Tbdp (Aspirin) .Marland Kitchen... Take 1 tablet by mouth once a day    Procardia Xl 60 Mg Tb24 (Nifedipine) .Marland Kitchen... Take 1 tablet by mouth once a day    Furosemide 40 Mg Tabs (Furosemide)  .Marland Kitchen... Take 1 tablet by mouth once a day  Problem # 3:  HYPERLIPIDEMIA (ICD-272.4) Diet + Welchol has helped... continue current meds... Her updated medication list for this problem includes:    Simvastatin 80 Mg Tabs (Simvastatin) .Marland Kitchen... Take one tablet by mouth at bedtime    Welchol 625 Mg Tabs (Colesevelam hcl) .Marland Kitchen... Take 2 tablets by mouth once daily  Problem # 4:  DIABETES MELLITUS (ICD-250.00) Stable>  labs look good... Her updated medication list for this problem includes:    Adult Aspirin Low Strength 81 Mg Tbdp (Aspirin) .Marland Kitchen... Take  1 tablet by mouth once a day    Glucophage 500 Mg Tabs (Metformin hcl) .Marland Kitchen... Take 1 tablet by mouth once a day  Problem # 5:  IRRITABLE BOWEL SYNDROME (ICD-564.1) Improved w/ the Bentyl & Welchol per DrDBrodie...  Problem # 6:  OTHER MEDICAL PROBLEMS AS NOTED>>> Continue same Rx...  Complete Medication List: 1)  Zyrtec Allergy 10 Mg Tabs (Cetirizine hcl) .... Take 1 tab daily in am... 2)  Flonase 50 Mcg/act Susp (Fluticasone propionate) .... 2 sprays in each nostril at bedtime.Marland KitchenMarland Kitchen 3)  Adult Aspirin Low Strength 81 Mg Tbdp (Aspirin) .... Take 1 tablet by mouth once a day 4)  Procardia Xl 60 Mg Tb24 (Nifedipine) .... Take 1 tablet by mouth once a day 5)  Furosemide 40 Mg Tabs (Furosemide) .... Take 1 tablet by mouth once a day 6)  Simvastatin 80 Mg Tabs (Simvastatin) .... Take one tablet by mouth at bedtime 7)  Glucophage 500 Mg Tabs (Metformin hcl) .... Take 1 tablet by mouth once a day 8)  Bentyl 10 Mg Caps (Dicyclomine hcl) .... Take 1 capsule by mouth before meals 9)  Mobic 7.5 Mg Tabs (Meloxicam) .... Take 1 tablet by mouth once a day as needed for arthritis pain... 10)  Alprazolam 0.5 Mg Tabs (Alprazolam) .... Take 1/2 to 1 tab by mouth three times a day as needed for nerves... 11)  Vitamin D3 50000 Unit Caps (Cholecalciferol) .... Take 1 cap by mouth each week... 12)  Welchol 625 Mg Tabs (Colesevelam hcl) .... Take 2 tablets by mouth once  daily  Other Orders: Flu Vaccine 53yrs + MEDICARE PATIENTS (E4540) Administration Flu vaccine - MCR (J8119)  Patient Instructions: 1)  Today we updated your med list- see below.... 2)  We refilled your perscriptions as requested... 3)  Today we did your follow up FASTING blood work... please call the "phone tree" in a few days for your lab results.Marland KitchenMarland Kitchen 4)  We also gave you the 2011 Fllu vaccine... 5)  Call for any problems.Marland KitchenMarland Kitchen 6)  Please schedule a follow-up appointment in 6 months. Prescriptions: WELCHOL 625 MG TABS (COLESEVELAM HCL) Take 2 tablets by mouth once daily  #60 x 12   Entered and Authorized by:   Michele Mcalpine MD   Signed by:   Michele Mcalpine MD on 02/24/2010   Method used:   Print then Give to Patient   RxID:   [id number] VITAMIN D3 50000 UNIT CAPS (CHOLECALCIFEROL) take 1 cap by mouth each week...  #4 x 12   Entered and Authorized by:   Michele Mcalpine MD   Signed by:   Michele Mcalpine MD on 02/24/2010   Method used:   Print then Give to Patient   RxID:   [id number] ALPRAZOLAM 0.5 MG TABS (ALPRAZOLAM) take 1/2 to 1 tab by mouth three times a day as needed for nerves...  #90 x 6   Entered and Authorized by:   Michele Mcalpine MD   Signed by:   Michele Mcalpine MD on 02/24/2010   Method used:   Print then Give to Patient   RxID:   [id number] MOBIC 7.5 MG  TABS (MELOXICAM) Take 1 tablet by mouth once a day as needed for arthritis pain...  #30 x 6   Entered and Authorized by:   Michele Mcalpine MD   Signed by:   Michele Mcalpine MD on 02/24/2010   Method used:   Print then Give to Patient   RxID:  [id number] BENTYL 10 MG CAPS (DICYCLOMINE HCL) Take 1 capsule by mouth before meals  #90 x 12   Entered and Authorized by:   Michele Mcalpine MD   Signed by:   Michele Mcalpine MD on 02/24/2010   Method used:   Print then Give to Patient   RxID:   [id number] GLUCOPHAGE 500 MG  TABS (METFORMIN HCL) Take 1 tablet by mouth once a day  #30 x 12   Entered and Authorized by:    Michele Mcalpine MD   Signed by:   Michele Mcalpine MD on 02/24/2010   Method used:   Print then Give to Patient   RxID:   [id number] SIMVASTATIN 80 MG TABS (SIMVASTATIN) take one tablet by mouth at bedtime  #30 x 12   Entered and Authorized by:   Michele Mcalpine MD   Signed by:   Michele Mcalpine MD on 02/24/2010   Method used:   Print then Give to Patient   RxID:   [id number] FUROSEMIDE 40 MG TABS (FUROSEMIDE) Take 1 tablet by mouth once a day  #30 x 12   Entered and Authorized by:   Michele Mcalpine MD   Signed by:   Michele Mcalpine MD on 02/24/2010   Method used:   Print then Give to Patient   RxID:   [id number] PROCARDIA XL 60 MG  TB24 (NIFEDIPINE) Take 1 tablet by mouth once a day  #30 x 12   Entered and Authorized by:   Michele Mcalpine MD   Signed by:   Michele Mcalpine MD on 02/24/2010   Method used:   Print then Give to Patient   RxID:   [id number] FLONASE 50 MCG/ACT  SUSP (FLUTICASONE PROPIONATE) 2 sprays in each nostril at bedtime...  #1 x 12   Entered and Authorized by:   Michele Mcalpine MD   Signed by:   Michele Mcalpine MD on 02/24/2010   Method used:   Print then Give to Patient   RxID:   [id number]     Flu Vaccine Consent Questions     Do you have a history of severe allergic reactions to this vaccine? no    Any prior history of allergic reactions to egg and/or gelatin? no    Do you have a sensitivity to the preservative Thimersol? no    Do you have a past history of Guillan-Barre Syndrome? no    Do you currently have an acute febrile illness? no    Have you ever had a severe reaction to latex? no    Vaccine information given and explained to patient? yes    Are you currently pregnant? no    Lot Number:[id number]   Exp Date:11/22/2010   Site Given  Left Deltoid IMflu   Randell Loop CMA  February 24, 2010 12:25 PM

## 2010-06-24 NOTE — Procedures (Signed)
Summary: COLON   Colonoscopy  Procedure date:  07/13/2000  Findings:      Location:  [facility].    Procedures Next Due Date:    Colonoscopy: 07/2010 Patient Name: Nichole Silva, Nichole Silva MRN:  Procedure Procedures: Colonoscopy CPT: [address].  Personnel: Endoscopist: Joshva Labreck L. Juanda Chance, MD.  Referred By: Lonzo Cloud. Kriste Basque, MD.  Exam Location: Exam performed in Outpatient Clinic. Outpatient  Patient Consent: Procedure, Alternatives, Risks and Benefits discussed, consent obtained, from patient.  Indications Symptoms: Diarrhea Patient is incontinent of stool. urgency.  History  Current Medications: Patient is taking an non-steroidal medication.  Pre-Exam Physical: Performed Jul 13, 2000. Cardio-pulmonary exam, Rectal exam, HEENT exam , Abdominal exam, Extremity exam, Neurological exam, Mental status exam WNL.  Exam Exam: Extent of exam reached: Cecum, extent intended: Cecum.  Images taken. ASA Classification: II. Tolerance: good.  Monitoring: Pulse and BP monitoring, Oximetry used. Supplemental O2 given.  Colon Prep Used Golytely for colon prep. Prep results: fair.  Sedation Meds: Fentanyl 100 mcg. Versed 10 mg.  Findings NORMAL EXAM: Cecum.  - DIVERTICULOSIS: Sigmoid Colon to Rectum. Not bleeding. ICD9: Diverticulosis: 562.10. Comments: moderately severe diverticulosis.   Assessment Abnormal examination, see findings above.  Diagnoses: 562.10: Diverticulosis.   Events  Unplanned Interventions: No intervention was required.  Unplanned Events: There were no complications. Plans Medication Plan: Antispasmodics: Librax TID, starting Jul 13, 2000  Fiber supplements: Methylcellulose 1 Tbsp QD, starting Jul 13, 2000   Patient Education: Patient given standard instructions for: Yearly hemoccult testing recommended. Patient instructed to get routine colonoscopy every 10 years.  Disposition: After procedure patient sent to recovery. After recovery patient  sent home.  Scheduling/Referral: Follow-Up prn.   This report was created from the original endoscopy report, which was reviewed and signed by the above listed endoscopist.

## 2010-06-24 NOTE — Progress Notes (Signed)
Summary: Triage  Phone Note Call from Patient   Caller: Daughter  Samara Deist  [phone] Call For: Dr. Juanda Chance Reason for Call: Talk to Nurse Summary of Call: Pt wants to know if she is suppose to replace her Zocor for the new meds. that was prescribed yesterday Initial call taken by: Karna Christmas,  September 17, 2009 1:47 PM  Follow-up for Phone Call         Pt. saw Dr.Trelyn Vanderlinde on 09-16-09, was given Welchol 625mg  2 daily. Pt. already takes Simvastatin 80mg  daily. Does she need to take both?  DR.Jaryan Chicoine PLEASE ADVISE  Follow-up by: Laureen Ochs LPN,  September 17, 2009 1:52 PM  Additional Follow-up for Phone Call Additional follow up Details #1::        Yes, she needs to take both because Welcol is for her diarrhea and Simvastatin is for her cholesterol. Additional Follow-up by: Hart Carwin MD,  September 18, 2009 12:49 PM    Additional Follow-up for Phone Call Additional follow up Details #2::    Above MD orders reviewed with patient's daughter. Pt. instructed to call back as needed.  Follow-up by: Laureen Ochs LPN,  September 18, 2009 1:27 PM

## 2010-06-24 NOTE — Progress Notes (Signed)
Summary: sch appt  Phone Note From Other Clinic Call back at [phone]   Caller: Referral Coordinator Rhonda  Call For: Juanda Chance Reason for Call: Schedule Patient Appt Summary of Call: Bjorn Loser from Dr Jodelle Green office wants to schedule this patient for f/u of  IBS, no appt. available. Initial call taken by: Tawni Levy,  August 26, 2009 2:22 PM  Follow-up for Phone Call        Message left for Rhonda--Pt. is scheduled for 09-16-09 at 4pm. Please advise pt. of med.list/co-pay/cx.policy. Please call back for any problems or questions.  Follow-up by: Laureen Ochs LPN,  August 26, 2009 3:04 PM
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
## 2010-07-10 ENCOUNTER — Encounter: Payer: Self-pay | Admitting: Internal Medicine

## 2010-07-16 NOTE — Letter (Signed)
Summary: Colonoscopy Letter  [facility]  [address] [place], [place] [address]   Phone: [phone]  Fax: [phone]      July 10, 2010 MRN: [id number]   [facility] [address] [place], [place]  [address]   Dear [name],   According to your medical record, it is time for you to schedule a Colonoscopy. The American Cancer Society recommends this procedure as a method to detect early colon cancer. Patients with a family history of colon cancer, or a personal history of colon polyps or inflammatory bowel disease are at increased risk.  This letter has been generated based on the recommendations made at the time of your procedure. If you feel that in your particular situation this may no longer apply, please contact our office.  Please call our office at [phone] to schedule this appointment or to update your records at your earliest convenience.  Thank you for cooperating with [place] to provide you with the very best care possible.   Sincerely,  Hedwig Morton. Juanda Chance, M.D.  [facility] [phone]

## 2010-08-21 ENCOUNTER — Encounter: Payer: Self-pay | Admitting: Pulmonary Disease

## 2010-08-25 ENCOUNTER — Ambulatory Visit: Payer: Self-pay | Admitting: Pulmonary Disease

## 2010-08-26 ENCOUNTER — Telehealth: Payer: Self-pay | Admitting: *Deleted

## 2010-08-26 NOTE — Telephone Encounter (Signed)
Awaiting faxed form from [employer].

## 2010-08-26 NOTE — Telephone Encounter (Signed)
Form received and given to Leigh. Please have SN sign and then fax to insurance.

## 2010-08-26 NOTE — Telephone Encounter (Signed)
PA for Dicyclomine APPROVED from 08/26/2010 to 08/26/2011. Patient and pharmacy notified.

## 2010-08-31 ENCOUNTER — Inpatient Hospital Stay (INDEPENDENT_AMBULATORY_CARE_PROVIDER_SITE_OTHER)
Admission: RE | Admit: 2010-08-31 | Discharge: 2010-08-31 | Disposition: A | Discharge status: Home / Self Care | Payer: Medicare Other | Source: Ambulatory Visit | Attending: Emergency Medicine | Admitting: Emergency Medicine

## 2010-08-31 DIAGNOSIS — R609 Edema, unspecified: Secondary | ICD-10-CM

## 2010-09-02 ENCOUNTER — Other Ambulatory Visit: Payer: Self-pay | Admitting: *Deleted

## 2010-09-02 MED ORDER — OMEPRAZOLE 40 MG PO CPDR: 40.0000 mg | DELAYED_RELEASE_CAPSULE | Freq: Every day | ORAL | Status: DC

## 2010-09-10 ENCOUNTER — Other Ambulatory Visit: Payer: Self-pay | Admitting: Pulmonary Disease

## 2010-09-25 ENCOUNTER — Telehealth: Payer: Self-pay | Admitting: Pulmonary Disease

## 2010-09-25 ENCOUNTER — Other Ambulatory Visit: Payer: Self-pay | Admitting: Pulmonary Disease

## 2010-09-25 DIAGNOSIS — I10 Essential (primary) hypertension: Secondary | ICD-10-CM

## 2010-09-25 DIAGNOSIS — E119 Type 2 diabetes mellitus without complications: Secondary | ICD-10-CM

## 2010-09-25 DIAGNOSIS — M810 Age-related osteoporosis without current pathological fracture: Secondary | ICD-10-CM

## 2010-09-25 DIAGNOSIS — E7800 Pure hypercholesterolemia, unspecified: Secondary | ICD-10-CM

## 2010-09-25 DIAGNOSIS — E039 Hypothyroidism, unspecified: Secondary | ICD-10-CM

## 2010-09-25 DIAGNOSIS — R748 Abnormal levels of other serum enzymes: Secondary | ICD-10-CM

## 2010-09-25 DIAGNOSIS — D649 Anemia, unspecified: Secondary | ICD-10-CM

## 2010-09-25 NOTE — Telephone Encounter (Signed)
Okay for:  tsh 244.9 bmet 401.9 cbcd 285.9 a1c 250.00 Lipid 272.0 Hepatic 790.5 Vit d 733.00  Thanks!

## 2010-09-25 NOTE — Telephone Encounter (Signed)
Pt coming in on Mon., 5/7 for 6 mo f/u. Pls advise on labs.

## 2010-09-25 NOTE — Telephone Encounter (Signed)
Order placed for fasting labs and pt is aware.

## 2010-09-26 ENCOUNTER — Other Ambulatory Visit (INDEPENDENT_AMBULATORY_CARE_PROVIDER_SITE_OTHER): Payer: Medicare Other

## 2010-09-26 ENCOUNTER — Other Ambulatory Visit: Payer: Self-pay | Admitting: Pulmonary Disease

## 2010-09-26 DIAGNOSIS — I10 Essential (primary) hypertension: Secondary | ICD-10-CM

## 2010-09-26 DIAGNOSIS — E119 Type 2 diabetes mellitus without complications: Secondary | ICD-10-CM

## 2010-09-26 DIAGNOSIS — M810 Age-related osteoporosis without current pathological fracture: Secondary | ICD-10-CM

## 2010-09-26 DIAGNOSIS — E7800 Pure hypercholesterolemia, unspecified: Secondary | ICD-10-CM

## 2010-09-26 DIAGNOSIS — R748 Abnormal levels of other serum enzymes: Secondary | ICD-10-CM

## 2010-09-26 DIAGNOSIS — D649 Anemia, unspecified: Secondary | ICD-10-CM

## 2010-09-26 DIAGNOSIS — E039 Hypothyroidism, unspecified: Secondary | ICD-10-CM

## 2010-09-26 LAB — BASIC METABOLIC PANEL
BUN: 12 mg/dL (ref 6–23)
Chloride: 105 mEq/L (ref 96–112)
Creatinine, Ser: 0.7 mg/dL (ref 0.4–1.2)
Glucose, Bld: 105 mg/dL — ABNORMAL HIGH (ref 70–99)
Sodium: 141 mEq/L (ref 135–145)

## 2010-09-26 LAB — HEPATIC FUNCTION PANEL
ALT: 10 U/L (ref 0–35)
Alkaline Phosphatase: 64 U/L (ref 39–117)

## 2010-09-26 LAB — CBC WITH DIFFERENTIAL/PLATELET
Basophils Absolute: 0 10*3/uL (ref 0.0–0.1)
Eosinophils Absolute: 0.6 10*3/uL (ref 0.0–0.7)
Lymphocytes Relative: 29.9 % (ref 12.0–46.0)
MCHC: 34.4 g/dL (ref 30.0–36.0)
MCV: 92.9 fl (ref 78.0–100.0)
Neutrophils Relative %: 53 % (ref 43.0–77.0)
Platelets: 241 10*3/uL (ref 150.0–400.0)

## 2010-09-26 LAB — LIPID PANEL
HDL: 42.6 mg/dL (ref >39.00)
VLDL: 22.2 mg/dL (ref 0.0–40.0)

## 2010-09-26 LAB — TSH: TSH: 3.31 u[IU]/mL (ref 0.35–5.50)

## 2010-09-29 ENCOUNTER — Ambulatory Visit (INDEPENDENT_AMBULATORY_CARE_PROVIDER_SITE_OTHER): Payer: Medicare Other | Admitting: Pulmonary Disease

## 2010-09-29 ENCOUNTER — Encounter: Payer: Self-pay | Admitting: Pulmonary Disease

## 2010-09-29 DIAGNOSIS — I872 Venous insufficiency (chronic) (peripheral): Secondary | ICD-10-CM

## 2010-09-29 DIAGNOSIS — L301 Dyshidrosis [pompholyx]: Secondary | ICD-10-CM | POA: Insufficient documentation

## 2010-09-29 DIAGNOSIS — F411 Generalized anxiety disorder: Secondary | ICD-10-CM

## 2010-09-29 DIAGNOSIS — I2510 Atherosclerotic heart disease of native coronary artery without angina pectoris: Secondary | ICD-10-CM

## 2010-09-29 DIAGNOSIS — I10 Essential (primary) hypertension: Secondary | ICD-10-CM

## 2010-09-29 DIAGNOSIS — E785 Hyperlipidemia, unspecified: Secondary | ICD-10-CM

## 2010-09-29 DIAGNOSIS — E559 Vitamin D deficiency, unspecified: Secondary | ICD-10-CM

## 2010-09-29 DIAGNOSIS — E119 Type 2 diabetes mellitus without complications: Secondary | ICD-10-CM

## 2010-09-29 DIAGNOSIS — M1990 Unspecified osteoarthritis, unspecified site: Secondary | ICD-10-CM

## 2010-09-29 DIAGNOSIS — R609 Edema, unspecified: Secondary | ICD-10-CM

## 2010-09-29 MED ORDER — HYDROCORTISONE 1 % EX OINT: 1.0000 "application " | TOPICAL_OINTMENT | Freq: Two times a day (BID) | CUTANEOUS | Status: DC

## 2010-09-29 NOTE — Patient Instructions (Signed)
Today we updated your med list in our EPIC system...    We decided to increase the LASIX fluid pill to twice daily for now...    We also wrote a prescription for a topical ointment to apply to you legs twice daily as needed for the rash...  You need to elim sodium/ salt from your diet> see hand-out... Keep your legs elevated as much as possible... Wear the support hose when up and about...  Let's plan a follow up visit in one month & we will check your labs at that time.Marland KitchenMarland Kitchen

## 2010-09-29 NOTE — Progress Notes (Signed)
Subjective:    Patient ID: Nichole Silva, female    DOB: 27-Jan-1935, 76 y.o.   MRN: [id number]  HPI [name] here for a follow up visit...she has mult med problems as noted below...   ~  August 26, 2009:  she is planning cataract surg 5/11 by [facility]... her new hearing aides are being adjusted... BP controlled on meds;  denies CP/ palpit/ SOB/ etc;  Chol fair on top dose of Simva; & BS OK on Metformin> she needs better diet & get weight down (see below)...  ~  February 24, 2010:  still working regularly at [employer]... she saw DrDBrodie 4/11 for IBS, divertics, fecal incontinence w/ decr rectal sphincter tone- she added Bentyl & [facility] & improved... BP remains under good control;  Lipids improved on current Rx;  & DM stable on 1 Metformin daily... OK Flu shot today...  ~  Sep 29, 2010:  5mo ROV & c/o rash & swelling in legs> she has VI, edema, dermatitis, etc; she went to [facility] & they stopped her Mobic; we discussed poss Derm eval but for now incr Lasix 40mg  Bid, NO SALT, ELEVATION, SUPPORT HOSE, & apply Hydrocort ointment bid as well... We will recheck pt in one month>        Problem List:  DECREASED HEARING (ICD-389.9) - she has digital hearing aides bilat...  ALLERGIC RHINITIS (ICD-477.9) - on ZYRTEK daily & FLONASE Qhs...  Hx of BRONCHITIS, RECURRENT (ICD-491.9) - she had URI & went to [facility] recently w/ "virus" diagnosed...  HYPERTENSION (ICD-401.9) - on PROCARDIA XL 60mg /d & LASIX 40mg /d... BP=136/82 today, and feeling well... BP's at home are similar range... denies HA, fatigue, visual changes, CP, palipit, dizziness, syncope, dyspnea, edema, etc... ~  CXR 4/11 showed mild basilar atelectasis, NAD.Marland Kitchen. ~  5/12:  We decided to incr LASIX to 40mg  Bid due to edema & recheck...  CORONARY ARTERY DISEASE (ICD-414.00) - on ASA 81mg /d... no CP, palpit, or change in SOB. ~  cath 1989 by DrStuckey showed non-obstructive dis w/ 40-50% LAD lesion, otherw norm... ~  EKG shows NSR,  NSSTTWA...  VENOUS INSUFFICIENCY (ICD-459.81) - she knows to elim sodium, elevate legs, wear support hose, & take the LASIX 40mg  ==> incr to Bid.  HYPERLIPIDEMIA (ICD-272.4) - on SIMVASTATIN 80mg /d now... ~  FLP 9/08 on Vytorin 10-80 showed TChol 143, TG 100, HDL 44, LDL 79 ~  FLP 4/09 on Vytorin still showed TChol 153, TG 95, HDL 42, LDL 92... changed to Simva80 for $$. ~  FLP 10/09 still on the Vytorin showed TChol 166, TG 134, HDL 46, LDL 94 ~  FLP 4/10 on Simva80 showed TChol 203, TG 141, HDL 47, LDL 131... rec> same + diet! ~  FLP 10/10 on Simva80 showed TChol 176, TG 139, HDL 41, LDL 107 ~  FLP 4/11 showed TChol 213, TG 225, HDL 50, LDL 130... consider change med, ?add zetia, for now= BETTER DIET! ~  FLP 10/11 on Simva80+Welchol2 showed TChol 185, TG 232, HDL 53, LDL 103 ~  FLP 5/12 on Simva80+Welchol2 showed TChol 155, TG 111, HDL 43, LDL 90... Great job!  DIABETES MELLITUS (ICD-250.00) - on METFORMIN 500mg /d and diet... ~  labs 9/08 showed BS=121, HgA1c=6.3 ~  labs 4/09 showed BS= 111, HgA1c= 6.5.Marland KitchenMarland Kitchen continue same Rx. ~  labs 9/09 (wt=192#) showed BS= 131, HgA1c= 6.6.Marland Kitchen. same. ~  Labs 4/10 (wt=190#) showed BS= 122, A1c= 6.5 ~  labs 10/10 (wt=194#) showed BS= 120, A1c= 6.5 ~  labs 4/11 (wt=186#)  showed BS= 124, A1c= 6.6 ~  labs 10/11 (wt=181#) showed BS= 115, A1c= 6.4 ~  Labs 5/12 (wt=182#) showed BS= 105, A1c= 6.8  IRRITABLE BOWEL SYNDROME (ICD-564.1) - she uses BENTYL 20mg  as needed... she tells me she wears depends for diarrhea prob. ~  last colonoscopy 2/02 by DrBrodie showed divertics only... f/u planned 5yrs. ~  4/11:  DrBrodie incr Bentyl to Tid & added WELCHOL 2tabs/d (diarrhea & incontinence improved)  DEGENERATIVE JOINT DISEASE (ICD-715.90) - on MOBIC 7.5mg  Prn...  VITAMIN D DEFICIENCY (ICD-268.9) - on Vit D 50000 u daily... ~  labs 4/10 showed Vit D level = 6... therefore started on Vit D 50000 u weekly. ~  labs 4/11 showed Vit D level = 38... OK to change to 2000 u  daily. ~  Labs 5/12 showed Vit d level = 25... Change back to VitD 50K weekly.  ANXIETY (ICD-300.00) - on ALPRAZOLAM 0.5mg  Prn... she's under alot of stress w/ her husb and a 77 y/o nephew dx w/ Marfan's, s/p valve surg, needs heart transplant...  GYN = DrMcPhail w/ BMD 1/06 @ SER showing norm w/ TScores -0.3 to -0.8.Marland KitchenMarland Kitchen   Past Surgical History  Procedure Date  . Dilation and curettage of uterus   . Cholecystectomy   . Implant left elbow     Outpatient Encounter Prescriptions as of 09/29/2010  Medication Sig Dispense Refill  . ALPRAZolam (XANAX) 0.5 MG tablet TAKE  1/2 - 1 TABLET BY MOUTH THREE TIMES DAILY AS NEEDED FOR NERVES  90 tablet  2  . aspirin 81 MG tablet Take 81 mg by mouth daily.        . cetirizine (ZYRTEC) 10 MG tablet Take 10 mg by mouth daily.        . Cholecalciferol (VITAMIN D-3) 5000 UNITS TABS Take 1 tablet by mouth once a week.        . colesevelam (WELCHOL) 625 MG tablet Take 2 tablets by mouth once daily       . dicyclomine (BENTYL) 10 MG capsule Take 10 mg by mouth 4 (four) times daily -  before meals and at bedtime.        . fluticasone (FLONASE) 50 MCG/ACT nasal spray 2 sprays by Nasal route daily.        . furosemide (LASIX) 40 MG tablet TAKE 1 TABLET BY MOUTH DAILY  30 tablet  PRN  . metFORMIN (GLUCOPHAGE) 500 MG tablet Take 500 mg by mouth daily with breakfast.        . NIFEDICAL XL 60 MG 24 hr tablet TAKE 1 TABLET BY MOUTH EVERY DAY  30 tablet  PRN  . omeprazole (PRILOSEC) 40 MG capsule Take 1 capsule (40 mg total) by mouth daily. 30 minutes before a meal  30 capsule  5  . simvastatin (ZOCOR) 80 MG tablet TAKE 1 TABLET BY MOUTH AT BEDTIME  30 tablet  PRN  . DISCONTD: meloxicam (MOBIC) 7.5 MG tablet Take 7.5 mg by mouth daily.          Allergies  Allergen Reactions  . Penicillins     REACTION: ITCHING AND SWELLING    Review of Systems        See HPI - all other systems neg except as noted... The patient complains of decreased hearing and dyspnea on  exertion.  The patient denies anorexia, fever, weight loss, weight gain, vision loss, hoarseness, chest pain, syncope, peripheral edema, prolonged cough, headaches, hemoptysis, abdominal pain, melena, hematochezia, severe indigestion/heartburn, hematuria, incontinence, muscle weakness, suspicious  skin lesions, transient blindness, difficulty walking, depression, unusual weight change, abnormal bleeding, enlarged lymph nodes, and angioedema.     Objective:   Physical Exam     WD, Overweight, [name] in NAD... Vital Signs:  Reviewed... GENERAL:  Alert & oriented; pleasant & cooperative... HEENT:  /AT, EOM-wnl, PERRLA, EACs-clear, TMs-wnl, NOSE-clear, THROAT-clear & wnl. NECK:  Supple w/ fairROM; no JVD; normal carotid impulses w/o bruits; no thyromegaly or nodules palpated; no lymphadenopathy. CHEST:  Clear to P & A; without wheezes/ rales/ or rhonchi. HEART:  Regular Rhythm; without murmurs/ rubs/ or gallops. ABDOMEN:  Soft & nontender; normal bowel sounds; no organomegaly or masses detected. EXT: without deformities, mild arthritic changes; no varicose veins/ +venous insuffic/ 1+edema. NEURO:  CN's intact;  no focal neuro deficits... DERM:  chronic dry skin dermatitis on legs...   Assessment & Plan:   HBP>  Controlled on CCB & Diuretic; continue same...  CAD>  Denies angina etc but she is too sedentary; rec incr exercise, continue ASA, continue BP meds, statin, DM rx etc...  VI/ Edema/ Dermatitis>  We discussed incr Lasix40Bid, no salt, elevation, support hose & Cortisone ointment trial...  LIPIDS>  Stable on Simva, continue same for now> needs better diet, get wt down...  DM>  Stable on the Metformin, needs better diet, get wt down...  Vit D defic>  rec to return to Vit D Rx 50K weekly.Marland KitchenMarland Kitchen

## 2010-10-09 ENCOUNTER — Other Ambulatory Visit: Payer: Self-pay | Admitting: Pulmonary Disease

## 2010-10-16 ENCOUNTER — Other Ambulatory Visit: Payer: Self-pay | Admitting: Pulmonary Disease

## 2010-10-20 ENCOUNTER — Encounter: Payer: Self-pay | Admitting: Pulmonary Disease

## 2010-11-04 ENCOUNTER — Ambulatory Visit (INDEPENDENT_AMBULATORY_CARE_PROVIDER_SITE_OTHER): Payer: Medicare Other | Admitting: Pulmonary Disease

## 2010-11-04 ENCOUNTER — Other Ambulatory Visit (INDEPENDENT_AMBULATORY_CARE_PROVIDER_SITE_OTHER): Payer: Medicare Other

## 2010-11-04 DIAGNOSIS — F411 Generalized anxiety disorder: Secondary | ICD-10-CM

## 2010-11-04 DIAGNOSIS — I872 Venous insufficiency (chronic) (peripheral): Secondary | ICD-10-CM

## 2010-11-04 DIAGNOSIS — I10 Essential (primary) hypertension: Secondary | ICD-10-CM

## 2010-11-04 DIAGNOSIS — E119 Type 2 diabetes mellitus without complications: Secondary | ICD-10-CM

## 2010-11-04 DIAGNOSIS — I2510 Atherosclerotic heart disease of native coronary artery without angina pectoris: Secondary | ICD-10-CM

## 2010-11-04 DIAGNOSIS — E785 Hyperlipidemia, unspecified: Secondary | ICD-10-CM

## 2010-11-04 DIAGNOSIS — M1990 Unspecified osteoarthritis, unspecified site: Secondary | ICD-10-CM

## 2010-11-04 DIAGNOSIS — R609 Edema, unspecified: Secondary | ICD-10-CM

## 2010-11-04 DIAGNOSIS — L301 Dyshidrosis [pompholyx]: Secondary | ICD-10-CM

## 2010-11-04 LAB — BASIC METABOLIC PANEL
BUN: 10 mg/dL (ref 6–23)
CO2: 33 mEq/L — ABNORMAL HIGH (ref 19–32)
Chloride: 100 mEq/L (ref 96–112)
Creatinine, Ser: 0.9 mg/dL (ref 0.4–1.2)
Glucose, Bld: 109 mg/dL — ABNORMAL HIGH (ref 70–99)
Potassium: 3.9 mEq/L (ref 3.5–5.1)

## 2010-11-04 NOTE — Patient Instructions (Signed)
Today we updated your med list in EPIC...    We decided to keep your LASIX fluid pill at 2 tabs daily>       You may take 2 tabs together in the AM, or space them out to one tab twice daily...  Today we did your follow up blood work to be sure you are tolerating the extra Lasix...    Please call the PHONE TREE in a few days for your results...    Dial [id number] & when prompted enter your patient number followed by the # symbol...    Your patient number is:  [id number]#  Remember:  No salt, elevate the legs, wear the support hose...  Call for any questions...  Let's plan a follow up eval in 4 months, sooner if needed for problems.Marland KitchenMarland Kitchen

## 2010-11-04 NOTE — Progress Notes (Signed)
Subjective:    Patient ID: Nichole Silva, female    DOB: 20-Nov-1934, 76 y.o.   MRN: [id number]  HPI [name] here for a follow up visit...she has mult med problems as noted below...   ~  August 26, 2009:  she is planning cataract surg 5/11 by [facility]... her new hearing aides are being adjusted... BP controlled on meds;  denies CP/ palpit/ SOB/ etc;  Chol fair on top dose of Simva; & BS OK on Metformin> she needs better diet & get weight down (see below)...  ~  February 24, 2010:  still working regularly at [employer]... she saw DrDBrodie 4/11 for IBS, divertics, fecal incontinence w/ decr rectal sphincter tone- she added Bentyl & [facility] & improved... BP remains under good control;  Lipids improved on current Rx;  & DM stable on 1 Metformin daily... OK Flu shot today...  ~  Sep 29, 2010:  37mo ROV & c/o rash & swelling in legs> she has VI, edema, dermatitis, etc; she went to [facility] & they stopped her Mobic; we discussed poss Derm eval but for now incr Lasix 40mg  Bid, NO SALT, ELEVATION, SUPPORT HOSE, & apply Hydrocort ointment bid as well... We will recheck pt in one month>  ~  November 04, 2010:  77mo ROV & she reports marked improvement in her LE swelling, rash, dermatitis using the Hydrocortisone ointment & extra Lasix; wt is down 3# and BP stable at 130/80... We decided to check BMet on the Lasix80 dose ( looks OK- continue same dose).   Problem List:  DECREASED HEARING (ICD-389.9) - she has digital hearing aides bilat...  ALLERGIC RHINITIS (ICD-477.9) - on ZYRTEK daily & FLONASE Qhs...  Hx of BRONCHITIS, RECURRENT (ICD-491.9) - she had URI & went to [facility] recently w/ "virus" diagnosed...  HYPERTENSION (ICD-401.9) - on PROCARDIA XL 60mg /d & LASIX 40mg /d... BP=130/80 today, and feeling well... BP's at home are similar range... denies HA, fatigue, visual changes, CP, palipit, dizziness, syncope, dyspnea, edema, etc... ~  CXR 4/11 showed mild basilar atelectasis, NAD.Marland Kitchen. ~  5/12:  We decided to incr  LASIX to 40mg  Bid due to edema & recheck...  CORONARY ARTERY DISEASE (ICD-414.00) - on ASA 81mg /d... no CP, palpit, or change in SOB. ~  cath 1989 by DrStuckey showed non-obstructive dis w/ 40-50% LAD lesion, otherw norm... ~  EKG shows NSR, NSSTTWA...  VENOUS INSUFFICIENCY (ICD-459.81) - she knows to elim sodium, elevate legs, wear support hose, & take the LASIX 40mg  ==> incr to Bid. ~  6/12:  Edema improved 7 wt down 3# on the Lasix 80mg /d; BUN=10, Creat=1.0, continue same dose...  HYPERLIPIDEMIA (ICD-272.4) - on SIMVASTATIN 80mg /d now... ~  FLP 9/08 on Vytorin 10-80 showed TChol 143, TG 100, HDL 44, LDL 79 ~  FLP 4/09 on Vytorin still showed TChol 153, TG 95, HDL 42, LDL 92... changed to Simva80 for $$. ~  FLP 10/09 still on the Vytorin showed TChol 166, TG 134, HDL 46, LDL 94 ~  FLP 4/10 on Simva80 showed TChol 203, TG 141, HDL 47, LDL 131... rec> same + diet! ~  FLP 10/10 on Simva80 showed TChol 176, TG 139, HDL 41, LDL 107 ~  FLP 4/11 showed TChol 213, TG 225, HDL 50, LDL 130... consider change med, ?add zetia, for now= BETTER DIET! ~  FLP 10/11 on Simva80+Welchol2 showed TChol 185, TG 232, HDL 53, LDL 103 ~  FLP 5/12 on Simva80+Welchol2 showed TChol 155, TG 111, HDL 43, LDL 90... Great job!  DIABETES MELLITUS (ICD-250.00) - on METFORMIN 500mg /d and diet... ~  labs 9/08 showed BS=121, HgA1c=6.3 ~  labs 4/09 showed BS= 111, HgA1c= 6.5.Marland KitchenMarland Kitchen continue same Rx. ~  labs 9/09 (wt=192#) showed BS= 131, HgA1c= 6.6.Marland Kitchen. same. ~  Labs 4/10 (wt=190#) showed BS= 122, A1c= 6.5 ~  labs 10/10 (wt=194#) showed BS= 120, A1c= 6.5 ~  labs 4/11 (wt=186#) showed BS= 124, A1c= 6.6 ~  labs 10/11 (wt=181#) showed BS= 115, A1c= 6.4 ~  Labs 5/12 (wt=182#) showed BS= 105, A1c= 6.8  IRRITABLE BOWEL SYNDROME (ICD-564.1) - she uses BENTYL 20mg  as needed... she tells me she wears depends for diarrhea prob. ~  last colonoscopy 2/02 by DrBrodie showed divertics only... f/u planned 45yrs. ~  4/11:  DrBrodie incr  Bentyl to Tid & added WELCHOL 2tabs/d (diarrhea & incontinence improved)  DEGENERATIVE JOINT DISEASE (ICD-715.90) - on MOBIC 7.5mg  Prn...  VITAMIN D DEFICIENCY (ICD-268.9) - on Vit D 50000 u daily... ~  labs 4/10 showed Vit D level = 6... therefore started on Vit D 50000 u weekly. ~  labs 4/11 showed Vit D level = 38... OK to change to 2000 u daily. ~  Labs 5/12 showed Vit d level = 25... Change back to VitD 50K weekly.  ANXIETY (ICD-300.00) - on ALPRAZOLAM 0.5mg  Prn... she's under alot of stress w/ her husb and a 63 y/o nephew dx w/ Marfan's, s/p valve surg, needs heart transplant...  GYN = DrMcPhail w/ BMD 1/06 @ SER showing norm w/ TScores -0.3 to -0.8.Marland KitchenMarland Kitchen   Past Surgical History  Procedure Date  . Dilation and curettage of uterus   . Cholecystectomy   . Implant left elbow     Outpatient Encounter Prescriptions as of 11/04/2010  Medication Sig Dispense Refill  . ALPRAZolam (XANAX) 0.5 MG tablet TAKE  1/2 - 1 TABLET BY MOUTH THREE TIMES DAILY AS NEEDED FOR NERVES  90 tablet  2  . aspirin 81 MG tablet Take 81 mg by mouth daily.        . cetirizine (ZYRTEC) 10 MG tablet Take 10 mg by mouth daily.        . colesevelam (WELCHOL) 625 MG tablet Take 2 tablets by mouth once daily       . dicyclomine (BENTYL) 10 MG capsule Take 10 mg by mouth 4 (four) times daily -  before meals and at bedtime.        . ergocalciferol (VITAMIN D2) 50000 UNITS capsule Take 50,000 Units by mouth once a week.        . fluticasone (FLONASE) 50 MCG/ACT nasal spray INHALE 2 SPRAYS IN EACH NOSTRIL AT BEDTIME  1 Act  6  . furosemide (LASIX) 40 MG tablet Take 2 tablets daily      . hydrocortisone 1 % ointment Apply 1 application topically 2 (two) times daily.  56 g  5  . metFORMIN (GLUCOPHAGE) 500 MG tablet TAKE 1 TABLET BY MOUTH EVERY DAY  30 tablet  PRN  . NIFEDICAL XL 60 MG 24 hr tablet TAKE 1 TABLET BY MOUTH EVERY DAY  30 tablet  PRN  . simvastatin (ZOCOR) 80 MG tablet TAKE 1 TABLET BY MOUTH AT BEDTIME  30  tablet  PRN  . omeprazole (PRILOSEC) 40 MG capsule Take 1 capsule (40 mg total) by mouth daily. 30 minutes before a meal  30 capsule  5    Allergies  Allergen Reactions  . Penicillins     REACTION: ITCHING AND SWELLING    Review of Systems  See HPI - all other systems neg except as noted... The patient complains of decreased hearing and dyspnea on exertion.  The patient denies anorexia, fever, weight loss, weight gain, vision loss, hoarseness, chest pain, syncope, peripheral edema, prolonged cough, headaches, hemoptysis, abdominal pain, melena, hematochezia, severe indigestion/heartburn, hematuria, incontinence, muscle weakness, suspicious skin lesions, transient blindness, difficulty walking, depression, unusual weight change, abnormal bleeding, enlarged lymph nodes, and angioedema.     Objective:   Physical Exam     WD, Overweight, [name] in NAD... Vital Signs:  Reviewed... GENERAL:  Alert & oriented; pleasant & cooperative... HEENT:  [place]/AT, EOM-wnl, PERRLA, EACs-clear, TMs-wnl, NOSE-clear, THROAT-clear & wnl. NECK:  Supple w/ fairROM; no JVD; normal carotid impulses w/o bruits; no thyromegaly or nodules palpated; no lymphadenopathy. CHEST:  Clear to P & A; without wheezes/ rales/ or rhonchi. HEART:  Regular Rhythm; without murmurs/ rubs/ or gallops. ABDOMEN:  Soft & nontender; normal bowel sounds; no organomegaly or masses detected. EXT: without deformities, mild arthritic changes; no varicose veins/ +venous insuffic/ 1+edema. NEURO:  CN's intact;  no focal neuro deficits... DERM:  chronic dry skin dermatitis on legs...   Assessment & Plan:   HBP>  Controlled on CCB & Diuretic; continue same...  CAD>  Denies angina etc but she is too sedentary; rec incr exercise, continue ASA, continue BP meds, statin, DM rx etc...  VI/ Edema/ Dermatitis>  Improved w/ Lasix80 & BMet looks OK- continue same; dermatitis much improved w/ the HC ointment...  LIPIDS>  Stable on Simva,  continue same for now> needs better diet, get wt down...  DM>  Stable on the Metformin, needs better diet, get wt down...  Vit D defic>  rec to return to Vit D Rx 50K weekly.Marland KitchenMarland Kitchen

## 2010-11-08 ENCOUNTER — Encounter: Payer: Self-pay | Admitting: Pulmonary Disease

## 2010-12-04 ENCOUNTER — Other Ambulatory Visit: Payer: Self-pay | Admitting: Pulmonary Disease

## 2011-01-02 ENCOUNTER — Other Ambulatory Visit: Payer: Self-pay | Admitting: Pulmonary Disease

## 2011-03-04 ENCOUNTER — Ambulatory Visit: Payer: Medicare Other | Admitting: Pulmonary Disease

## 2011-03-11 ENCOUNTER — Telehealth: Payer: Self-pay | Admitting: Pulmonary Disease

## 2011-03-11 NOTE — Telephone Encounter (Signed)
Attempted to call pt phone has been disconnected. No alt # to reach PT will wait for pt to call back,

## 2011-03-12 NOTE — Telephone Encounter (Signed)
Attempted to call pt but the number that is listed is not a working number.  Will sign off on this message and wait for pt to call back.

## 2011-03-16 ENCOUNTER — Telehealth: Payer: Self-pay | Admitting: Pulmonary Disease

## 2011-03-16 NOTE — Telephone Encounter (Signed)
I have attempted to call pt at # listed but states it was disconnected. No alternate # listed. Will await pt call back

## 2011-03-17 NOTE — Telephone Encounter (Signed)
ATC pt at # listed but states the # has been disconnected. Will sign off message and await for her to call back

## 2011-03-18 ENCOUNTER — Encounter: Payer: Self-pay | Admitting: Pulmonary Disease

## 2011-03-18 ENCOUNTER — Ambulatory Visit (INDEPENDENT_AMBULATORY_CARE_PROVIDER_SITE_OTHER): Payer: Medicare Other | Admitting: Pulmonary Disease

## 2011-03-18 DIAGNOSIS — I10 Essential (primary) hypertension: Secondary | ICD-10-CM

## 2011-03-18 DIAGNOSIS — K589 Irritable bowel syndrome without diarrhea: Secondary | ICD-10-CM

## 2011-03-18 DIAGNOSIS — E785 Hyperlipidemia, unspecified: Secondary | ICD-10-CM

## 2011-03-18 DIAGNOSIS — I872 Venous insufficiency (chronic) (peripheral): Secondary | ICD-10-CM

## 2011-03-18 DIAGNOSIS — I2510 Atherosclerotic heart disease of native coronary artery without angina pectoris: Secondary | ICD-10-CM

## 2011-03-18 DIAGNOSIS — F411 Generalized anxiety disorder: Secondary | ICD-10-CM

## 2011-03-18 DIAGNOSIS — Z23 Encounter for immunization: Secondary | ICD-10-CM

## 2011-03-18 DIAGNOSIS — J309 Allergic rhinitis, unspecified: Secondary | ICD-10-CM

## 2011-03-18 DIAGNOSIS — E119 Type 2 diabetes mellitus without complications: Secondary | ICD-10-CM

## 2011-03-18 DIAGNOSIS — M1990 Unspecified osteoarthritis, unspecified site: Secondary | ICD-10-CM

## 2011-03-18 MED ORDER — FUROSEMIDE 40 MG PO TABS: ORAL_TABLET | ORAL | Status: DC

## 2011-03-18 NOTE — Patient Instructions (Signed)
Today we updated your med list in our EPIC system...    Continue your current medications the same...  Today we gave you the 2012 Flu vaccine...  Let's get on track w/ our diet & exercise program...  Let's plan a follow up visit in 4-6 months w/ FASTING blood work at that time.Marland KitchenMarland Kitchen

## 2011-03-18 NOTE — Progress Notes (Signed)
Subjective:    Patient ID: Nichole Silva, female    DOB: 12/30/1934, 76 y.o.   MRN: [id number]  HPI [name] here for a follow up visit...she has mult med problems as noted below...   ~  February 24, 2010:  still working regularly at [employer]... she saw DrDBrodie 4/11 for IBS, divertics, fecal incontinence w/ decr rectal sphincter tone- she added Bentyl [facility] & improved... BP remains under good control;  Lipids improved on current Rx;  & DM stable on 1 Metformin daily... OK Flu shot today...  ~  Sep 29, 2010:  3mo ROV & c/o rash & swelling in legs> she has VI, edema, dermatitis, etc; she went to [facility] & they stopped her Mobic; we discussed poss Derm eval but for now incr Lasix 40mg  Bid, NO SALT, ELEVATION, SUPPORT HOSE, & apply Hydrocort ointment bid as well... We will recheck pt in one month>  ~  November 04, 2010:  25mo ROV & she reports marked improvement in her LE swelling, rash, dermatitis using the Hydrocortisone ointment & extra Lasix; wt is down 3# and BP stable at 130/80... We decided to check BMet on the Lasix40 dose ( looks OK- continue same dose).  ~  March 18, 2011:  76mo ROV & she is overall improved; swelling in legs about the same on Lasix40 1-2 daily & low sodium diet; BP controlled on meds, weight is up 4# to 184# today; she denies CP, palpit, incr SOB, or ch in edema; we reviewed labs & lipids stable on Simva & Welchol; GI stable as well & we decided to wait on f/u FASTING blood work til return in 4-52mo...   Problem List:  DECREASED HEARING (ICD-389.9) - she has digital hearing aides bilat...  ALLERGIC RHINITIS (ICD-477.9) - on ZYRTEK daily & FLONASE Qhs...  Hx of BRONCHITIS, RECURRENT (ICD-491.9) - she had URI & went to [facility] recently w/ "virus" diagnosed...  HYPERTENSION (ICD-401.9) - on PROCARDIA XL 60mg /d & LASIX 40mg  1-2 daily... BP=124/80 today, and feeling well... BP's at home are similar range... denies HA, fatigue, visual changes, CP, palipit, dizziness, syncope,  dyspnea, edema, etc... ~  CXR 4/11 showed mild basilar atelectasis, NAD.Marland Kitchen. ~  5/12:  We decided to incr LASIX to 40mg  Bid due to edema & recheck... ~  10/12:  BP stable 7 chr edema improved w/ Lasix40mg  taking 1-2 daily...  CORONARY ARTERY DISEASE (ICD-414.00) - on ASA 81mg /d... no CP, palpit, or change in SOB. ~  cath 1989 by DrStuckey showed non-obstructive dis w/ 40-50% LAD lesion, otherw norm... ~  EKG shows NSR, NSSTTWA...  VENOUS INSUFFICIENCY (ICD-459.81) - she knows to elim sodium, elevate legs, wear support hose, & take the LASIX 40mg  ==> incr to Bid. ~  6/12:  Edema improved & wt down 3# on the Lasix 80mg /d; BUN=10, Creat=1.0, continue same dose...  HYPERLIPIDEMIA (ICD-272.4) - on SIMVASTATIN 80mg /d now... ~  FLP 9/08 on Vytorin 10-80 showed TChol 143, TG 100, HDL 44, LDL 79 ~  FLP 4/09 on Vytorin still showed TChol 153, TG 95, HDL 42, LDL 92... changed to Simva80 for $$. ~  FLP 10/09 still on the Vytorin showed TChol 166, TG 134, HDL 46, LDL 94 ~  FLP 4/10 on Simva80 showed TChol 203, TG 141, HDL 47, LDL 131... rec> same + diet! ~  FLP 10/10 on Simva80 showed TChol 176, TG 139, HDL 41, LDL 107 ~  FLP 4/11 showed TChol 213, TG 225, HDL 50, LDL 130... consider change  med, ?add zetia, for now= BETTER DIET! ~  FLP 10/11 on Simva80+Welchol2 showed TChol 185, TG 232, HDL 53, LDL 103 ~  FLP 5/12 on Simva80+Welchol2 showed TChol 155, TG 111, HDL 43, LDL 90... Great job!  DIABETES MELLITUS (ICD-250.00) - on METFORMIN 500mg /d and diet... ~  labs 9/08 showed BS=121, HgA1c=6.3 ~  labs 4/09 showed BS= 111, HgA1c= 6.5.Marland KitchenMarland Kitchen continue same Rx. ~  labs 9/09 (wt=192#) showed BS= 131, HgA1c= 6.6.Marland Kitchen. same. ~  Labs 4/10 (wt=190#) showed BS= 122, A1c= 6.5 ~  labs 10/10 (wt=194#) showed BS= 120, A1c= 6.5 ~  labs 4/11 (wt=186#) showed BS= 124, A1c= 6.6 ~  labs 10/11 (wt=181#) showed BS= 115, A1c= 6.4 ~  Labs 5/12 (wt=182#) showed BS= 105, A1c= 6.8  IRRITABLE BOWEL SYNDROME (ICD-564.1) - she uses  BENTYL 20mg  as needed... she tells me she wears depends for diarrhea prob. ~  last colonoscopy 2/02 by DrBrodie showed divertics only... f/u planned 51yrs. ~  4/11:  DrBrodie incr Bentyl to Tid & added WELCHOL 2tabs/d (diarrhea & incontinence improved)  DEGENERATIVE JOINT DISEASE (ICD-715.90) - on MOBIC 7.5mg  Prn...  VITAMIN D DEFICIENCY (ICD-268.9) - on Vit D 50000 u daily... ~  labs 4/10 showed Vit D level = 6... therefore started on Vit D 50000 u weekly. ~  labs 4/11 showed Vit D level = 38... OK to change to 2000 u daily. ~  Labs 5/12 showed Vit d level = 25... Change back to VitD 50K weekly.  ANXIETY (ICD-300.00) - on ALPRAZOLAM 0.5mg  Prn... she's under alot of stress w/ her husb and a 35 y/o nephew dx w/ Marfan's, s/p valve surg, needs heart transplant...  GYN = DrMcPhail w/ BMD 1/06 @ [facility] showing norm w/ TScores -0.3 to -0.8.Marland KitchenMarland Kitchen   Past Surgical History  Procedure Date  . Dilation and curettage of uterus   . Cholecystectomy   . Left elbow surgery   . Cataract surg     Outpatient Encounter Prescriptions as of 03/18/2011  Medication Sig Dispense Refill  . ALPRAZolam (XANAX) 0.5 MG tablet TAKE 1 TABLET BY MOUTH THREE TIMES DAILY AS NEEDED FOR NERVES  90 tablet  2  . aspirin 81 MG tablet Take 81 mg by mouth daily.        . cetirizine (ZYRTEC) 10 MG tablet Take 10 mg by mouth daily.        . colesevelam (WELCHOL) 625 MG tablet Take 2 tablets by mouth once daily       . dicyclomine (BENTYL) 10 MG capsule Take 10 mg by mouth 4 (four) times daily -  before meals and at bedtime.        . ergocalciferol (VITAMIN D2) 50000 UNITS capsule Take 50,000 Units by mouth once a week.        . fluticasone (FLONASE) 50 MCG/ACT nasal spray INHALE 2 SPRAYS IN EACH NOSTRIL AT BEDTIME  1 Act  6  . furosemide (LASIX) 40 MG tablet Take 2 tablets daily  60 tablet  11  . hydrocortisone 1 % ointment Apply 1 application topically 2 (two) times daily.  56 g  5  . metFORMIN (GLUCOPHAGE) 500 MG tablet TAKE 1  TABLET BY MOUTH EVERY DAY  30 tablet  PRN  . NIFEDICAL XL 60 MG 24 hr tablet TAKE 1 TABLET BY MOUTH EVERY DAY  30 tablet  PRN  . simvastatin (ZOCOR) 80 MG tablet TAKE 1 TABLET BY MOUTH AT BEDTIME  30 tablet  PRN  . DISCONTD: omeprazole (PRILOSEC) 40 MG capsule  Take 1 capsule (40 mg total) by mouth daily. 30 minutes before a meal  30 capsule  5    Allergies  Allergen Reactions  . Penicillins     REACTION: ITCHING AND SWELLING    Current Medications, Allergies, Past Medical History, Past Surgical History, Family History, and Social History were reviewed in [employer] record.   Review of Systems        See HPI - all other systems neg except as noted... The patient complains of decreased hearing and dyspnea on exertion.  The patient denies anorexia, fever, weight loss, weight gain, vision loss, hoarseness, chest pain, syncope, peripheral edema, prolonged cough, headaches, hemoptysis, abdominal pain, melena, hematochezia, severe indigestion/heartburn, hematuria, incontinence, muscle weakness, suspicious skin lesions, transient blindness, difficulty walking, depression, unusual weight change, abnormal bleeding, enlarged lymph nodes, and angioedema.     Objective:   Physical Exam     WD, Overweight, [name] in NAD... Vital Signs:  Reviewed... GENERAL:  Alert & oriented; pleasant & cooperative... HEENT:  [place]/AT, EOM-wnl, PERRLA, EACs-clear, TMs-wnl, NOSE-clear, THROAT-clear & wnl. NECK:  Supple w/ fairROM; no JVD; normal carotid impulses w/o bruits; no thyromegaly or nodules palpated; no lymphadenopathy. CHEST:  Clear to P & A; without wheezes/ rales/ or rhonchi. HEART:  Regular Rhythm; without murmurs/ rubs/ or gallops. ABDOMEN:  Soft & nontender; normal bowel sounds; no organomegaly or masses detected. EXT: without deformities, mild arthritic changes; no varicose veins/ +venous insuffic/ 1+edema (chronic) NEURO:  CN's intact;  no focal neuro deficits... DERM:   chronic dry skin dermatitis on legs...   Assessment & Plan:   HBP>  Controlled on CCB & Diuretic; continue same...  CAD>  Denies angina etc but she is too sedentary; rec incr exercise, continue ASA, continue BP meds, statin, DM rx etc...  VI/ Edema/ Dermatitis>  Improved w/ Lasix40 taking 1-2 daily & BMet looks OK- continue same; dermatitis much improved w/ the HC ointment...  LIPIDS>  Stable on Simva, continue same for now> needs better diet, get wt down...  DM>  Stable on the Metformin, needs better diet, get wt down...  Vit D defic>  rec to return to Vit D Rx 50K weekly.Marland KitchenMarland Kitchen

## 2011-03-26 ENCOUNTER — Other Ambulatory Visit: Payer: Self-pay | Admitting: Pulmonary Disease

## 2011-03-26 NOTE — Telephone Encounter (Signed)
Please advise if okay to refill the alprazolam thanks

## 2011-05-17 ENCOUNTER — Emergency Department (INDEPENDENT_AMBULATORY_CARE_PROVIDER_SITE_OTHER)
Admission: EM | Admit: 2011-05-17 | Discharge: 2011-05-17 | Disposition: A | Discharge status: Home / Self Care | Payer: Medicare Other | Source: Home / Self Care

## 2011-05-17 ENCOUNTER — Encounter (HOSPITAL_COMMUNITY): Payer: Self-pay | Admitting: *Deleted

## 2011-05-17 DIAGNOSIS — J309 Allergic rhinitis, unspecified: Secondary | ICD-10-CM

## 2011-05-17 DIAGNOSIS — J040 Acute laryngitis: Secondary | ICD-10-CM

## 2011-05-17 MED ORDER — METHYLPREDNISOLONE ACETATE 80 MG/ML IJ SUSP
INTRAMUSCULAR | Status: AC
  Filled 2011-05-17: qty 1

## 2011-05-17 MED ORDER — METHYLPREDNISOLONE ACETATE 80 MG/ML IJ SUSP
80.0000 mg | Freq: Once | INTRAMUSCULAR | Status: AC
  Administered 2011-05-17: 80 mg via INTRAMUSCULAR

## 2011-05-17 NOTE — ED Provider Notes (Signed)
History     CSN: [id number]  Arrival date & time 05/17/11  1231   None     Chief Complaint  Patient presents with  . Hoarse  . Sore Throat  . Nasal Congestion    (Consider location/radiation/quality/duration/timing/severity/associated sxs/prior treatment) HPI Comments: Pt states she was raking leaves 4 days ago (Wed) in afternoon. That evening she began to notice increased nasal congestion and post nasal drainage. She states she has a hx of allergies so has chronic nasal congestion and post nasal drainage but this is more and thicker than usual. It is white in color. She uses Flonase, but has run out and has to pick up her refill at the pharmacy. She now also has developed a hoarse voice. She has no fever and states she only occasionally coughs. No chest congestion or dyspnea.   The history is provided by the patient.    Past Medical History  Diagnosis Date  . Unspecified hearing loss   . Allergic rhinitis, cause unspecified   . Unspecified chronic bronchitis   . Unspecified essential hypertension   . Coronary atherosclerosis of unspecified type of vessel, native or graft   . Unspecified venous (peripheral) insufficiency   . Type II or unspecified type diabetes mellitus without mention of complication, not stated as uncontrolled   . Osteoarthrosis, unspecified whether generalized or localized, unspecified site   . Unspecified vitamin D deficiency   . Anxiety state, unspecified   . Hyperlipidemia   . IBS (irritable bowel syndrome)     Past Surgical History  Procedure Date  . Dilation and curettage of uterus   . Cholecystectomy   . Left elbow surgery   . Cataract surg     Family History  Problem Relation Age of Onset  . Emphysema Mother   . Heart disease Father   . Colon cancer    . Pancreatic cancer      History  Substance Use Topics  . Smoking status: Never Smoker   . Smokeless tobacco: Never Used  . Alcohol Use: No    OB History    Grav Para Term  Preterm Abortions TAB SAB Ect Mult Living                  Review of Systems  Constitutional: Negative for fever, chills and fatigue.  HENT: Positive for congestion, rhinorrhea and postnasal drip. Negative for ear pain, sore throat and sinus pressure.   Respiratory: Negative for cough, shortness of breath and wheezing.   Cardiovascular: Negative for chest pain and palpitations.    Allergies  Penicillins  Home Medications   Current Outpatient Rx  Name Route Sig Dispense Refill  . ALPRAZOLAM 0.5 MG PO TABS  TAKE 1 TABLET BY MOUTH THREE TIMES DAILY AS NEEDED FOR NERVES 90 tablet 5  . ASPIRIN 81 MG PO TABS Oral Take 81 mg by mouth daily.      Marland Kitchen CETIRIZINE HCL 10 MG PO TABS Oral Take 10 mg by mouth daily.      Marland Kitchen DICYCLOMINE HCL 10 MG PO CAPS Oral Take 10 mg by mouth 4 (four) times daily -  before meals and at bedtime.      . ERGOCALCIFEROL 50000 UNITS PO CAPS Oral Take 50,000 Units by mouth once a week.      Marland Kitchen FLUTICASONE PROPIONATE 50 MCG/ACT NA SUSP  INHALE 2 SPRAYS IN EACH NOSTRIL AT BEDTIME 1 Act 6  . FUROSEMIDE 40 MG PO TABS  Take 2 tablets daily 60 tablet 11  .  GUAIFENESIN ER 600 MG PO TB12 Oral Take 1,200 mg by mouth 2 (two) times daily.      Marland Kitchen METFORMIN HCL 500 MG PO TABS  TAKE 1 TABLET BY MOUTH EVERY DAY 30 tablet PRN  . NIFEDICAL XL 60 MG PO TB24  TAKE 1 TABLET BY MOUTH EVERY DAY 30 tablet PRN  . SIMVASTATIN 80 MG PO TABS  TAKE 1 TABLET BY MOUTH AT BEDTIME 30 tablet PRN  . WELCHOL 625 MG PO TABS  TAKE 2 TABLETS BY MOUTH DAILY 60 tablet 11  . HYDROCORTISONE 1 % EX OINT Topical Apply 1 application topically 2 (two) times daily. 56 g 5    BP 113/80  Pulse 78  Temp(Src) 97.9 F (36.6 C) (Oral)  Resp 18  SpO2 97%  Physical Exam  Nursing note and vitals reviewed. Constitutional: She appears well-developed and well-nourished. No distress.  HENT:  Head: Normocephalic and atraumatic.  Right Ear: Tympanic membrane, external ear and ear canal normal.  Left Ear: Tympanic  membrane, external ear and ear canal normal.  Nose: Mucosal edema present. No rhinorrhea, nasal deformity or septal deviation. Right sinus exhibits no maxillary sinus tenderness and no frontal sinus tenderness. Left sinus exhibits no maxillary sinus tenderness and no frontal sinus tenderness.  Mouth/Throat: Uvula is midline, oropharynx is clear and moist and mucous membranes are normal. No oropharyngeal exudate, posterior oropharyngeal edema or posterior oropharyngeal erythema.  Neck: Neck supple.  Cardiovascular: Normal rate, regular rhythm and normal heart sounds.   Pulmonary/Chest: Effort normal and breath sounds normal. No respiratory distress.  Lymphadenopathy:    She has no cervical adenopathy.  Neurological: She is alert.  Skin: Skin is warm and dry.  Psychiatric: She has a normal mood and affect.    ED Course  Procedures (including critical care time)  Labs Reviewed - No data to display No results found.   1. Allergic rhinitis   2. Laryngitis acute       MDM   Chronic allergic rhinitis, with increased symptoms after raking leaves.        Melody Comas, [place] 05/17/11 2041

## 2011-05-17 NOTE — ED Notes (Signed)
Pt with onset of sinus congestion/ mild sore throat Nichole Silva - pt concerned due to losing voice has a full time job and needs her voice for work

## 2011-05-19 NOTE — ED Provider Notes (Signed)
Medical screening examination/treatment/procedure(s) were performed by non-physician practitioner and as supervising physician I was immediately available for consultation/collaboration.  LANEY,RONNIE   Ronnie Laney, MD 05/19/11 1212 

## 2011-06-06 ENCOUNTER — Other Ambulatory Visit: Payer: Self-pay | Admitting: Pulmonary Disease

## 2011-07-13 ENCOUNTER — Encounter (HOSPITAL_COMMUNITY): Payer: Self-pay | Admitting: *Deleted

## 2011-07-13 ENCOUNTER — Emergency Department (INDEPENDENT_AMBULATORY_CARE_PROVIDER_SITE_OTHER)
Admission: EM | Admit: 2011-07-13 | Discharge: 2011-07-13 | Disposition: A | Discharge status: Home / Self Care | Payer: Medicare Other | Source: Home / Self Care | Attending: Emergency Medicine | Admitting: Emergency Medicine

## 2011-07-13 ENCOUNTER — Emergency Department (INDEPENDENT_AMBULATORY_CARE_PROVIDER_SITE_OTHER): Payer: Medicare Other

## 2011-07-13 ENCOUNTER — Emergency Department (HOSPITAL_COMMUNITY): Payer: Medicare Other

## 2011-07-13 DIAGNOSIS — S20219A Contusion of unspecified front wall of thorax, initial encounter: Secondary | ICD-10-CM

## 2011-07-13 DIAGNOSIS — S8000XA Contusion of unspecified knee, initial encounter: Secondary | ICD-10-CM

## 2011-07-13 DIAGNOSIS — S8002XA Contusion of left knee, initial encounter: Secondary | ICD-10-CM

## 2011-07-13 IMAGING — CR DG RIBS W/ CHEST 3+V*R*
4 series · 4 of 4 positions shown · non-contrast
Comparison: 04/26/2010.

CLINICAL DATA: Fall with anterior chest pain.

RIGHT RIBS AND CHEST - 3+ VIEW

[view not recorded (1 of 4)]
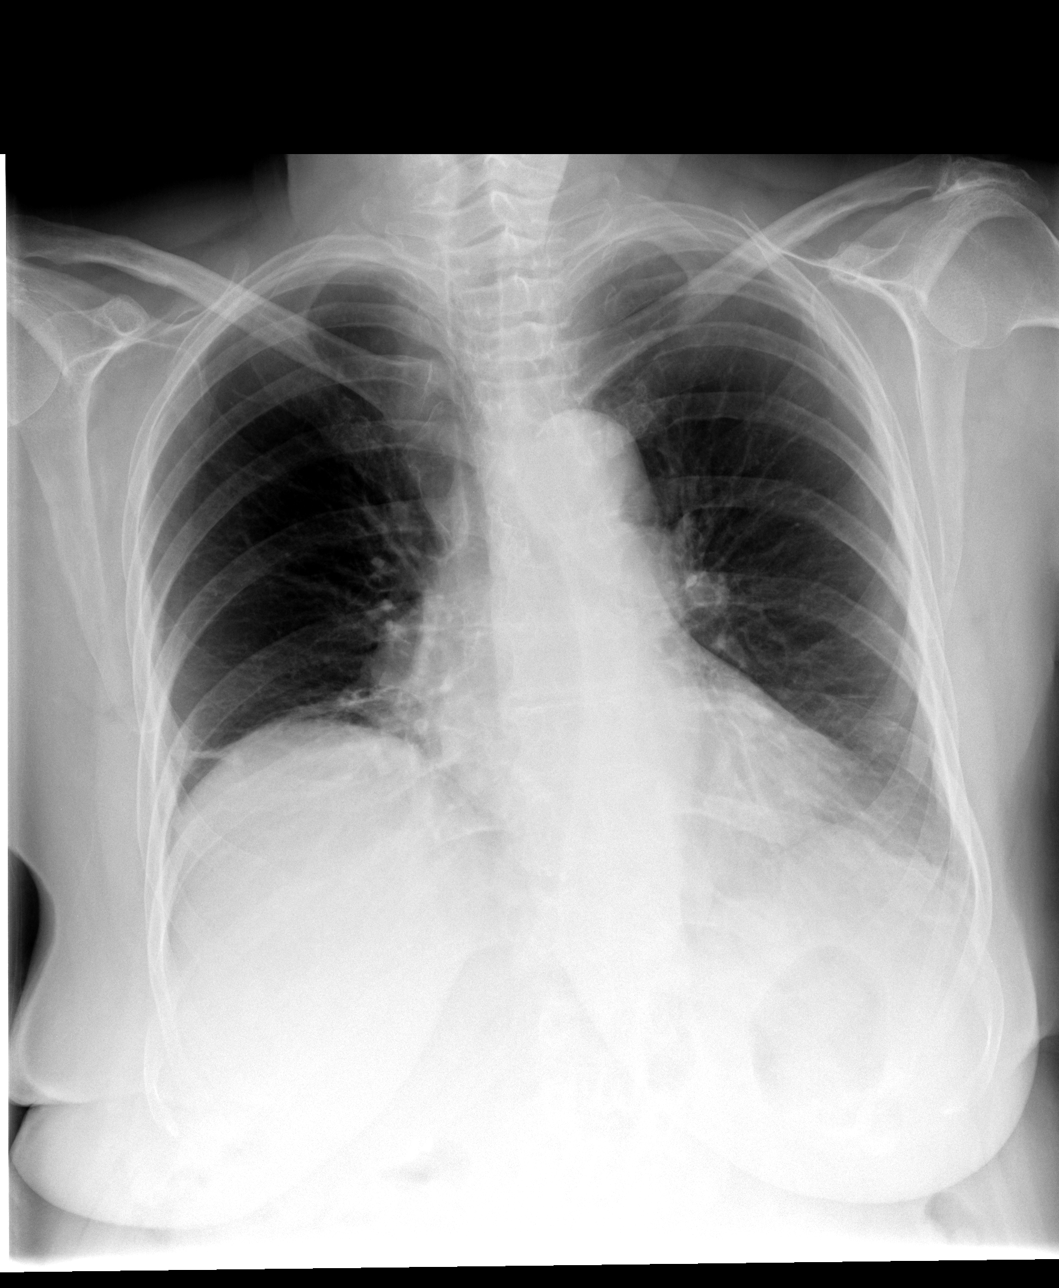

[view not recorded (2 of 4)]
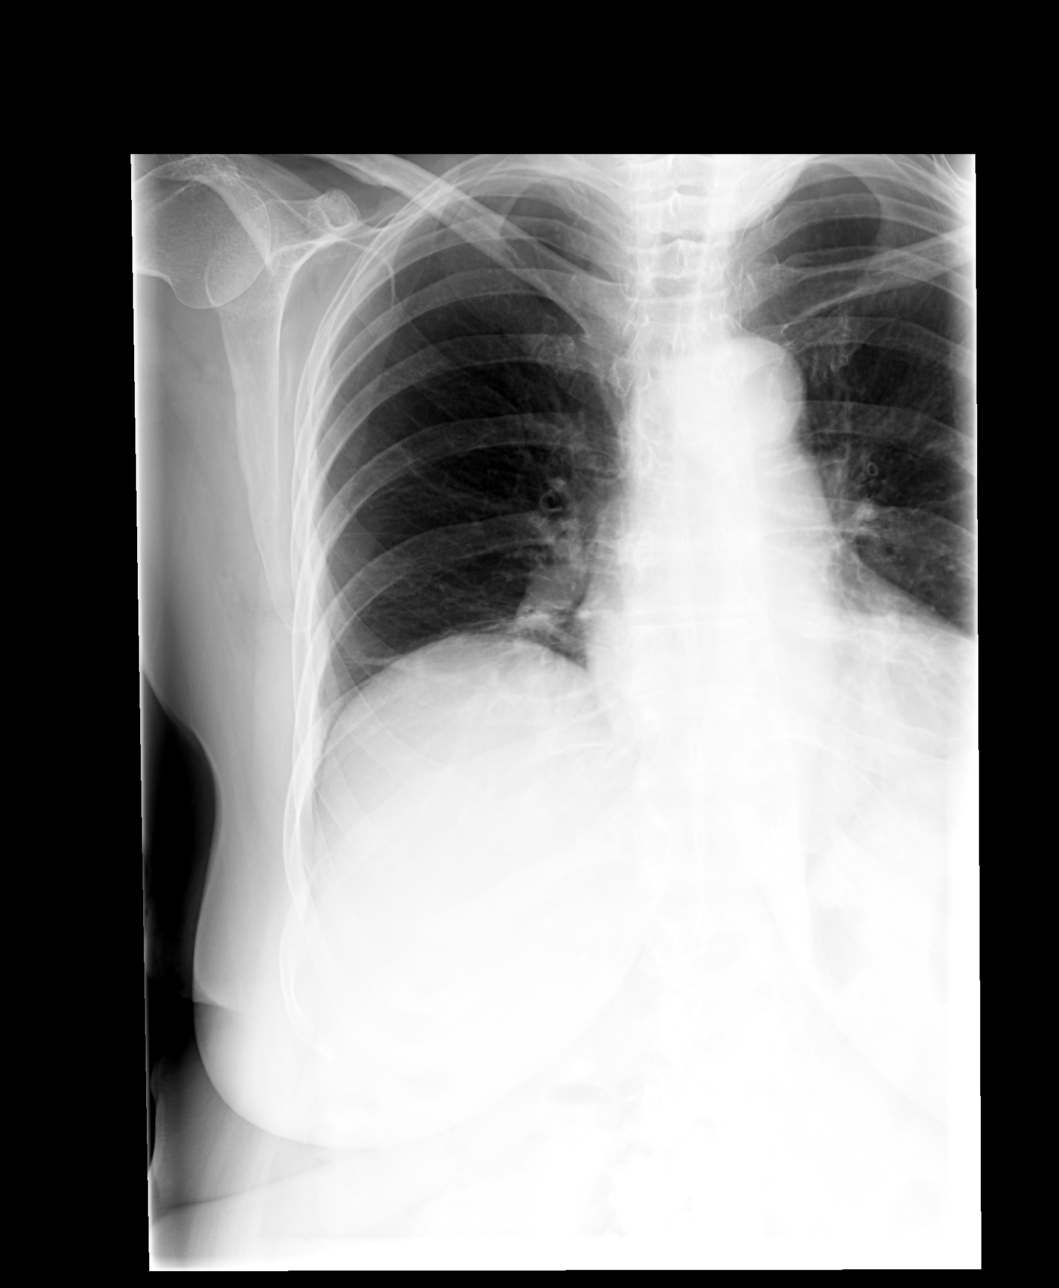

[view not recorded (3 of 4)]
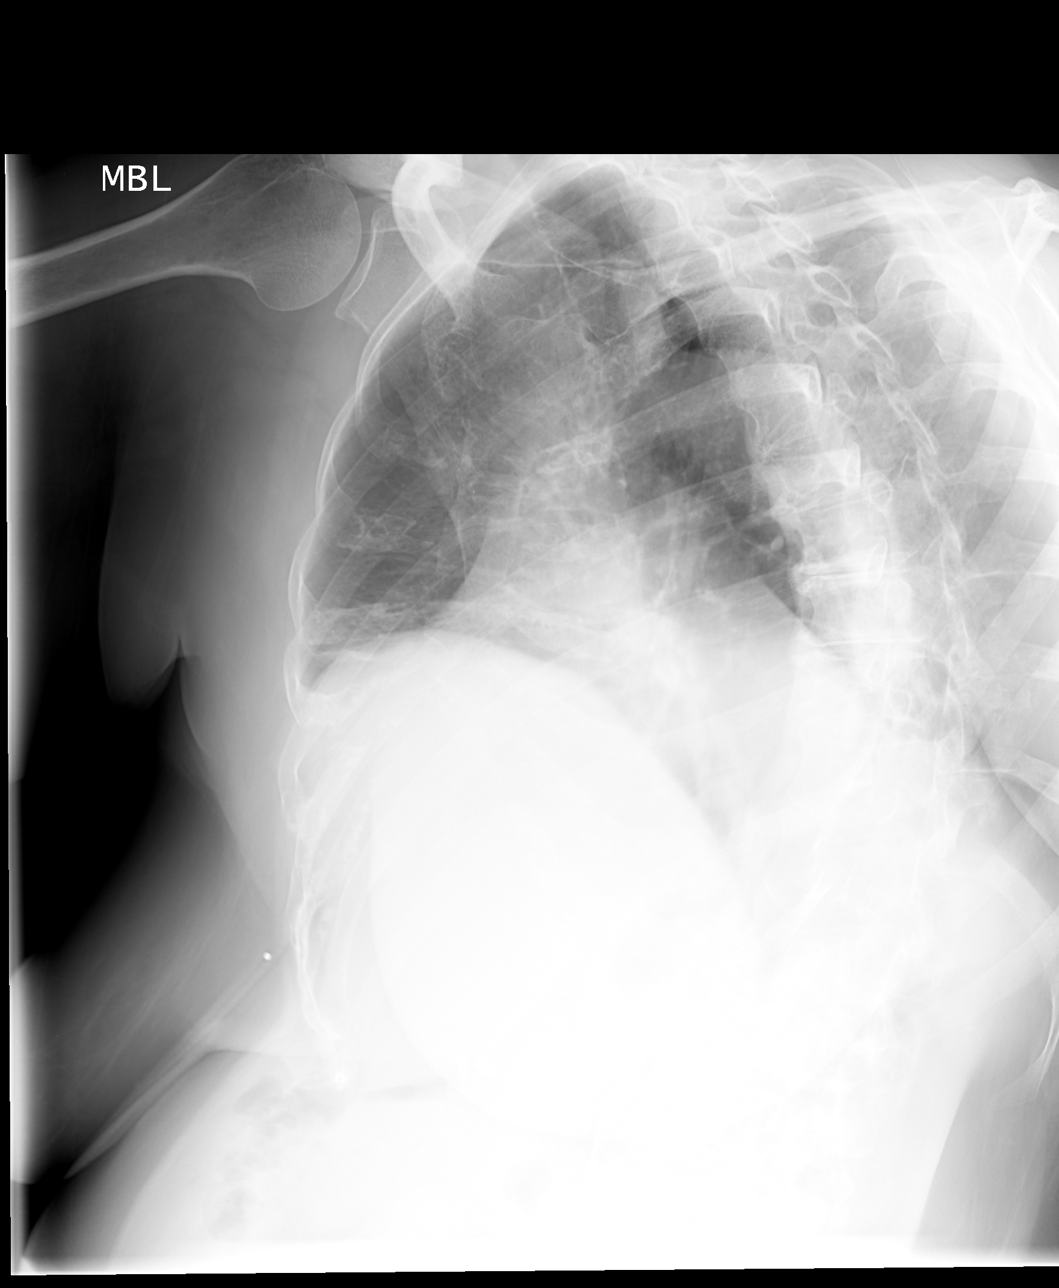

[view not recorded (4 of 4)]
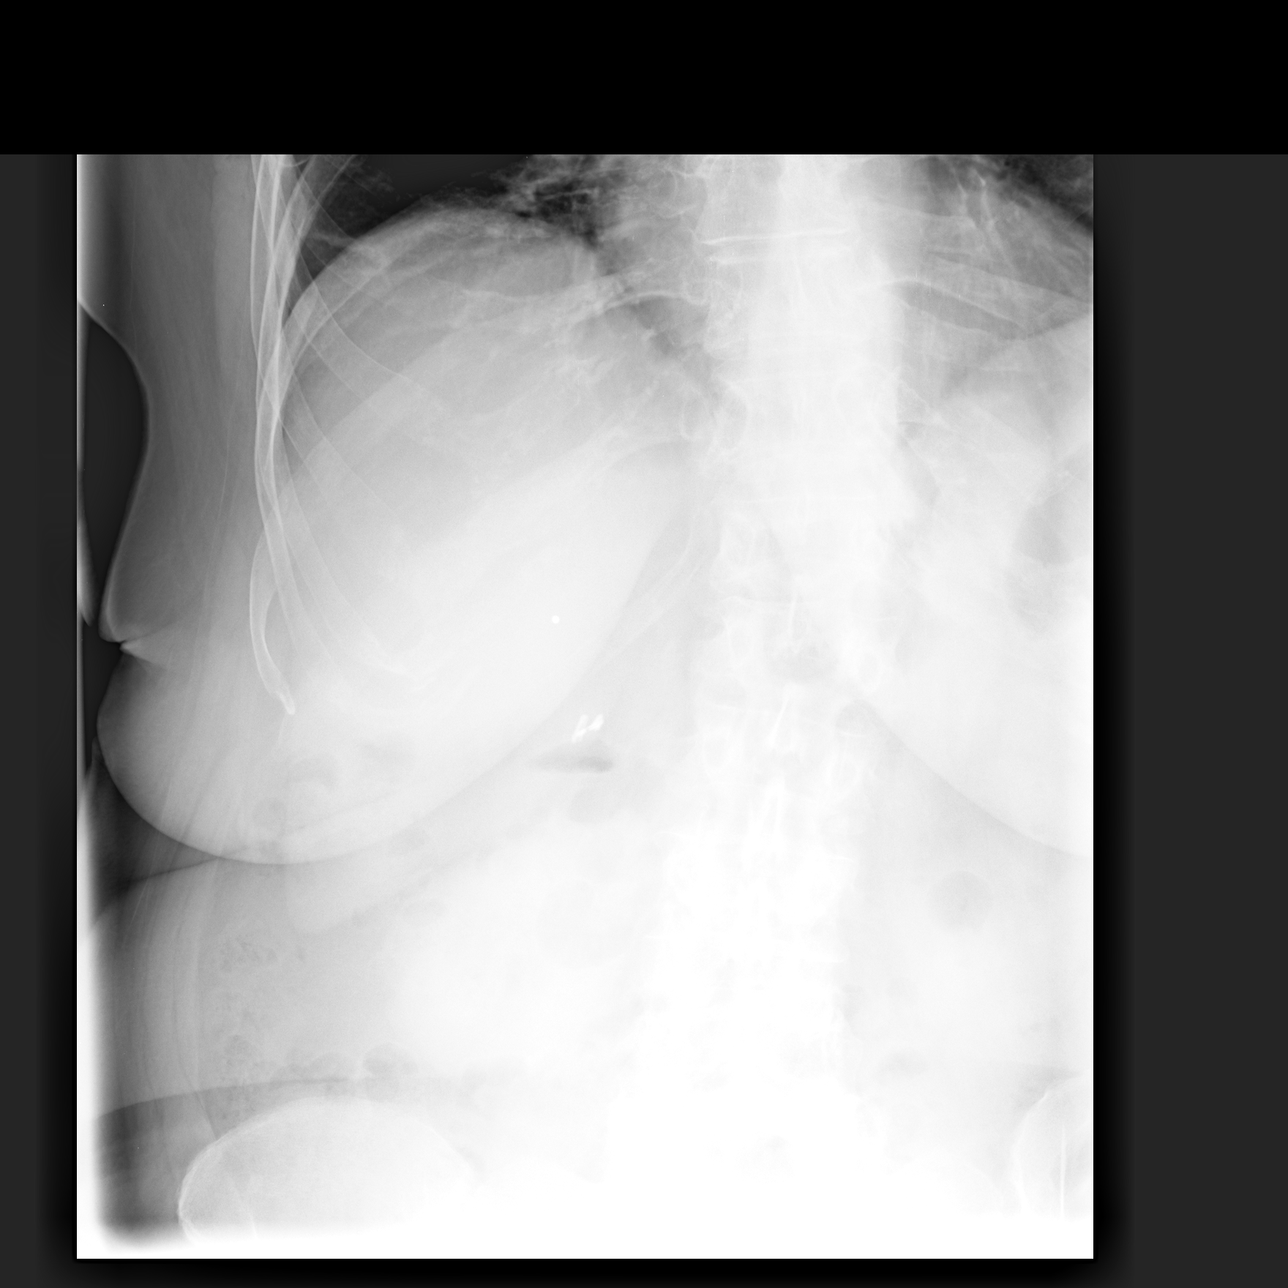

[4 of 4 positions shown; findings below may reference images not displayed]

FINDINGS: Trachea is midline.  Heart size stable.  Linear
atelectasis and/or scarring at both lung bases.  Lungs are
otherwise clear.  No pleural fluid.

Dedicated views of the right ribs show no definite fracture.
IMPRESSION: 1.  Bibasilar atelectasis and/or scarring.
2.  No definite acute rib fracture.

## 2011-07-13 IMAGING — CR DG KNEE COMPLETE 4+V*L*
4 series · 4 of 4 positions shown · non-contrast
Comparison: None

CLINICAL DATA: Fell.  Left knee pain.

LEFT KNEE - COMPLETE 4+ VIEW

[view not recorded (1 of 4)]
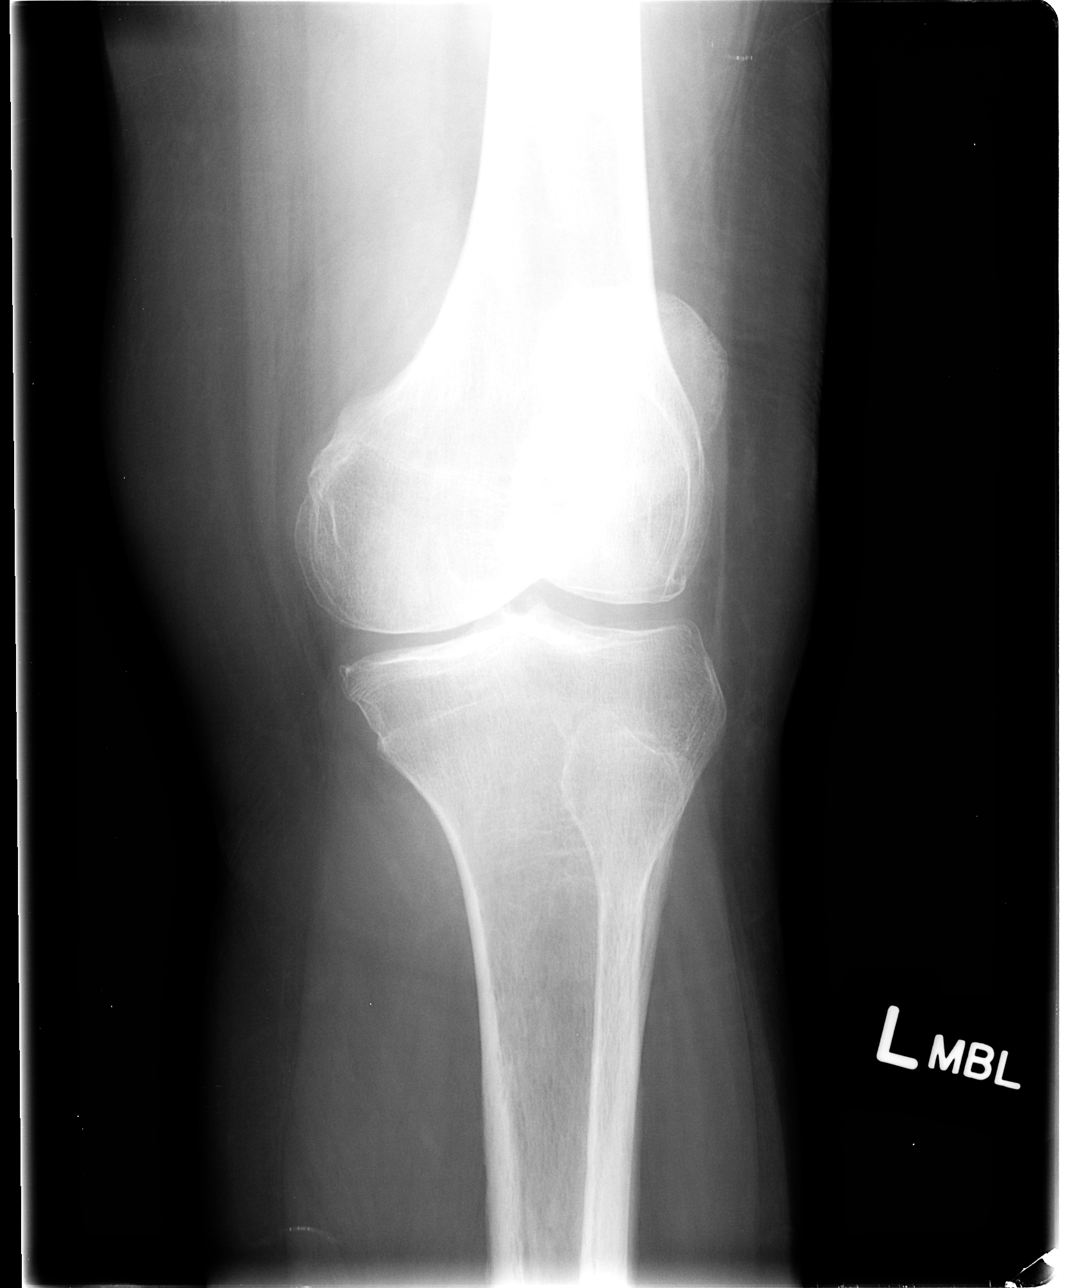

[view not recorded (2 of 4)]
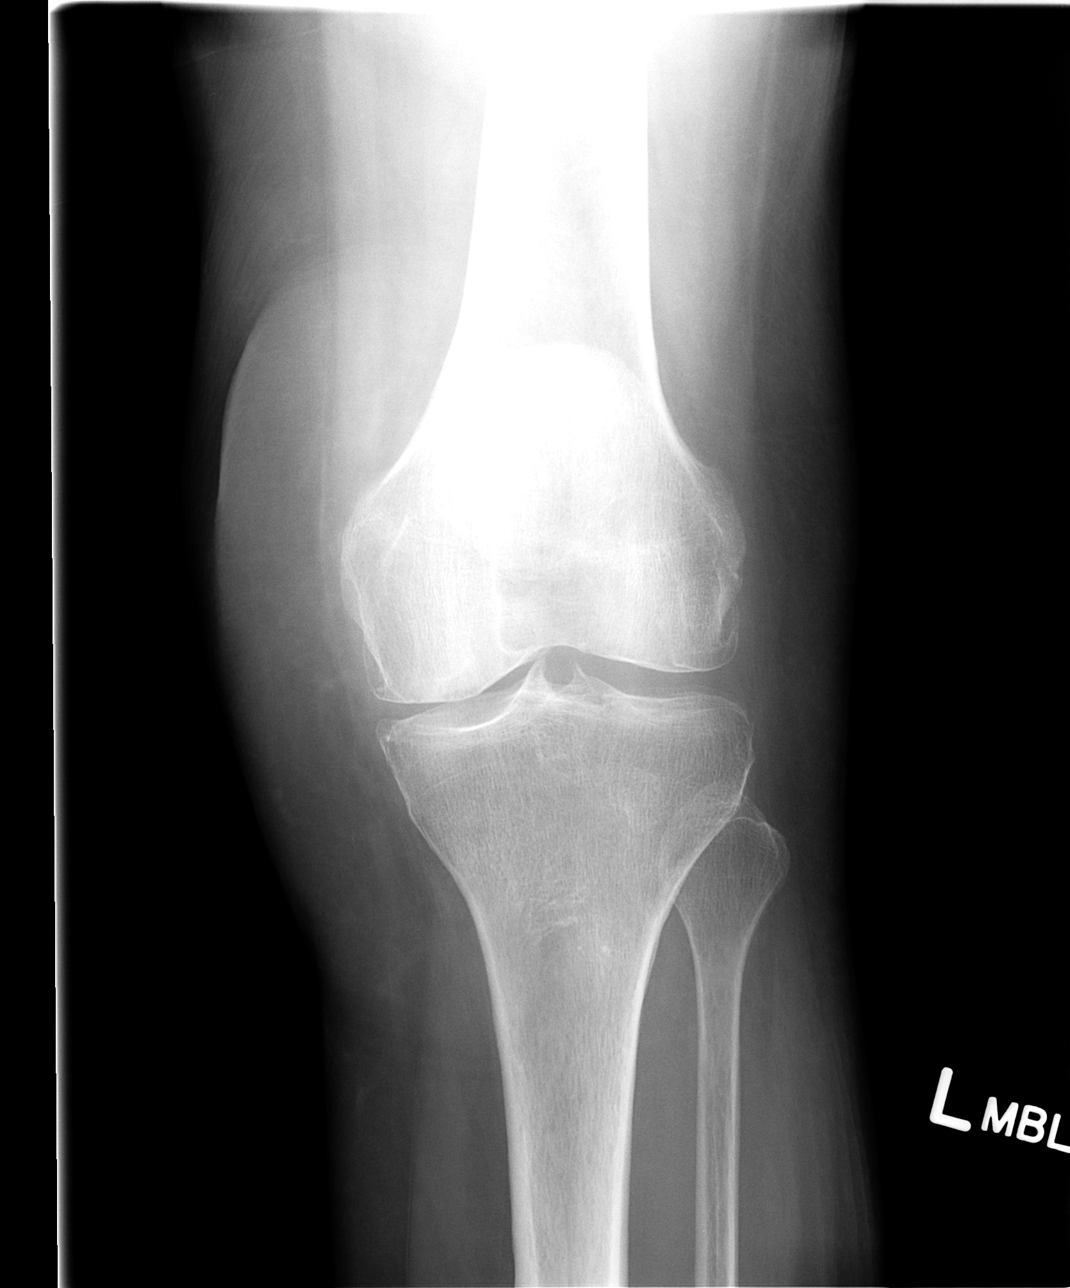

[view not recorded (3 of 4)]
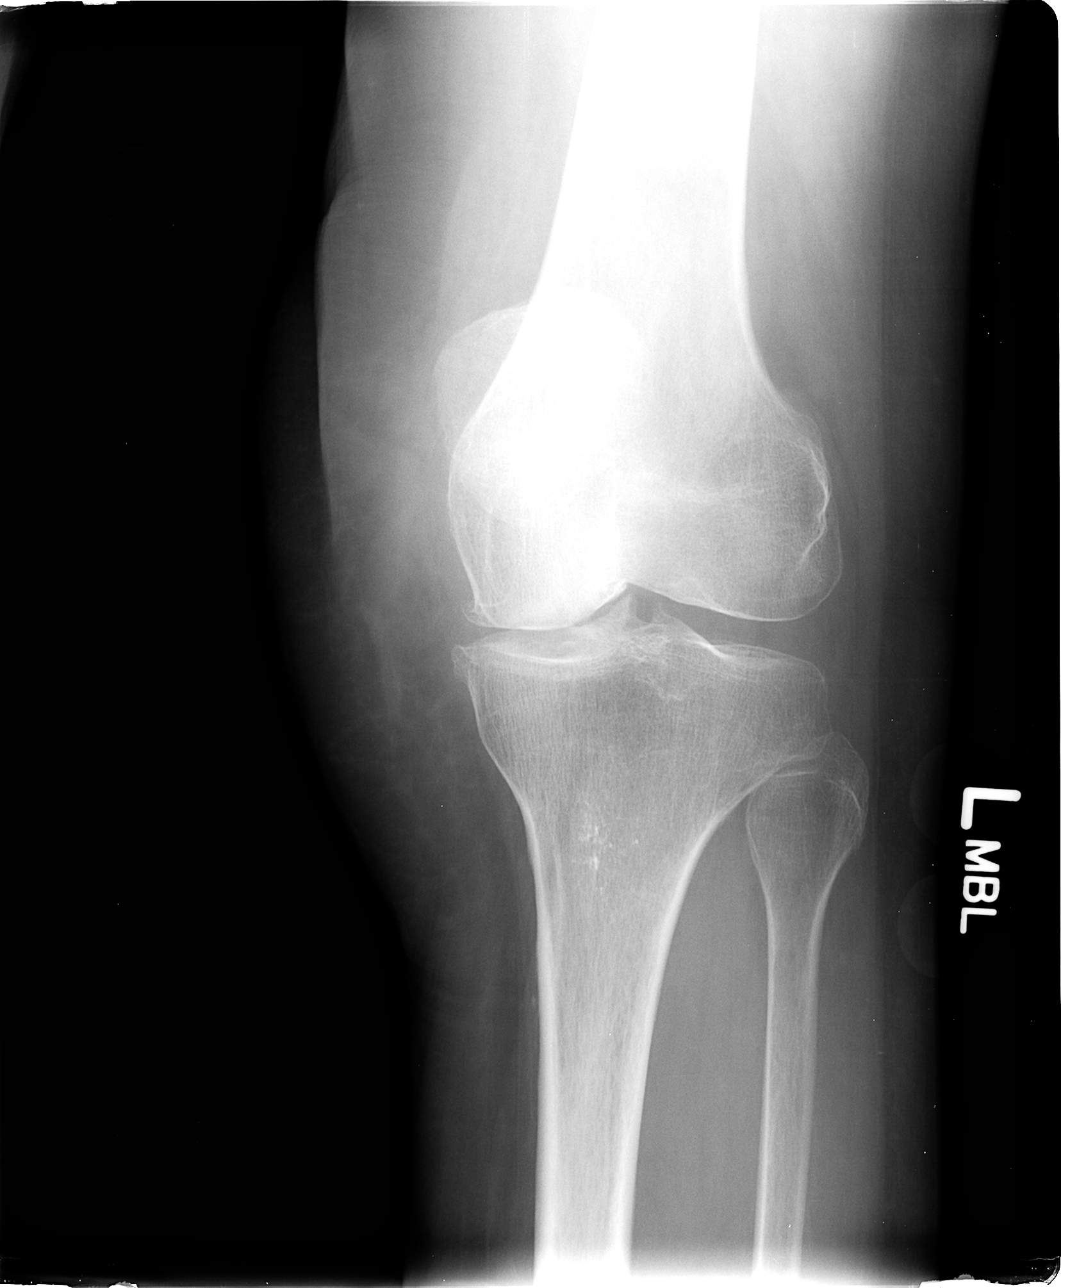

[view not recorded (4 of 4)]
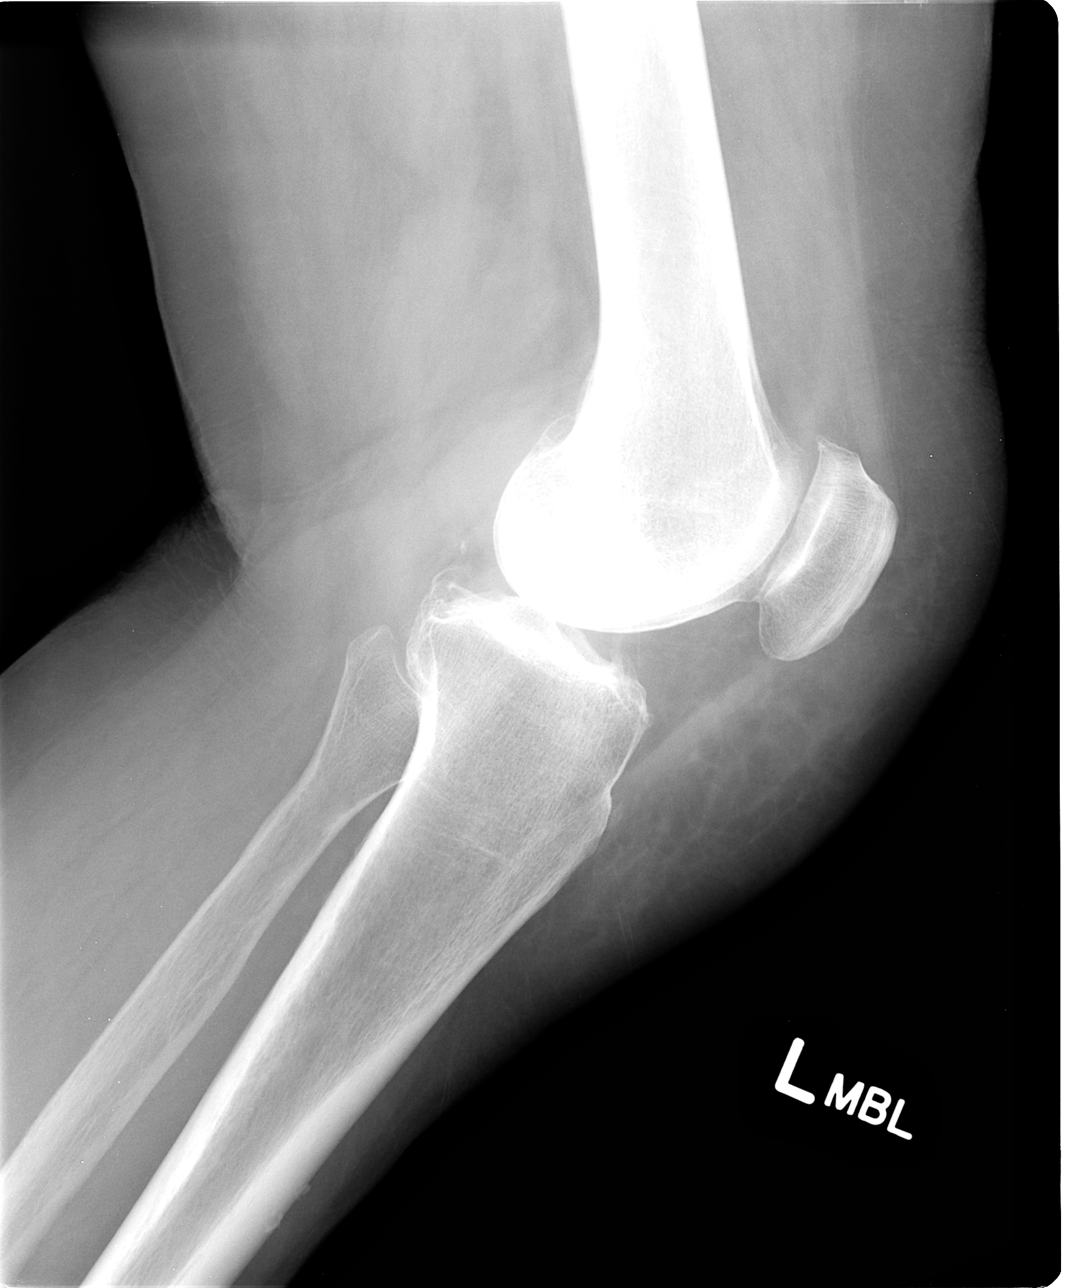

[4 of 4 positions shown; findings below may reference images not displayed]

FINDINGS: There are moderate tricompartmental degenerative changes
but no acute fracture or osteochondral lesion.  Osteoporosis is
noted.  A tiny joint effusion is noted.  There is anterior soft
tissue swelling.
IMPRESSION: 1.  Moderate tricompartmental degenerative changes.
2.  No acute fracture.
3.  Small joint effusion.

## 2011-07-13 NOTE — ED Provider Notes (Signed)
Chief Complaint  Patient presents with  . Fall    History of Present Illness:   Nichole Silva is a 77 year old female who tripped and fell today at 8:30 AM at her home. She was wearing bedroom slippers at the time and was stepping up to go up a step when she tripped, landing on her right chest and left knee. She did not hit her head and there was no loss of consciousness. She denies any dizziness, syncope, presyncope, palpitations, or irregular heartbeat preceding the episode. Ever since then she's had soreness in her right anterolateral rib cage area. There is no swelling, bruising or deformity. She also has some swelling and bruising over her left knee. This is not free tender and she can ambulate without difficulty. She also thinks she may have struck her nose but denies any swelling, tenderness, or pain in that area.  Review of Systems:  Other than noted above, the patient denies any of the following symptoms: Systemic:  No fevers or chills. Eye:  No diplopia or blurred vision. ENT:  No headache, facial pain, or bleeding from the nose or ears.  No loose or broken teeth. Neck:  No neck pain or stiffnes. Resp:  No shortness of breath. Cardiac:  No chest pain. No palpitations, dizziness, syncope or fainting. GI:  No abdominal pain. No nausea, vomiting, or diarrhea. GU:  No blood in urine. M-S:  No extremity pain, swelling, bruising, limited ROM, neck or back pain. Neuro:  No headache, loss of consciousness, seizure activity, dizziness, vertigo, paresthesias, numbness, or weakness.  No difficulty with speech or ambulation.   PMFSH:  Past medical history, family history, social history, meds, and allergies were reviewed.  Physical Exam:   Vital signs:  BP 141/82  Pulse 88  Temp(Src) 97.4 F (36.3 C) (Oral)  Resp 16  SpO2 95% General:  Alert, oriented and in no distress. Eye:  PERRL, full EOMs. ENT:  No cranial or facial tenderness to palpation. There is no tenderness to palpation over the  nose, no swelling or bruising. Neck:  No tenderness to palpation.  Full ROM without pain. Heart:  Regular rhythm.  No extrasystoles, gallops, or murmers. Lungs:  No chest wall tenderness to palpation. Breath sounds clear and equal bilaterally.  No wheezes, rales or rhonchi. Abdomen:  Non tender. Back:  Non tender to palpation.  Full ROM without pain. Extremities:  Exam of the left knee reveals a bruise overlying the patella. This was mildly tender to palpation. The knee had a full range of motion with no pain. Cruciate and collateral ligaments were intact. Otherwise there was no tenderness, swelling, bruising or deformity.  Full ROM of all joints without pain.  Pulses full.  Brisk capillary refill. Neuro:  Alert and oriented times 3.  Cranial nerves intact.  No muscle weakness.  Sensation intact to light touch.  Gait normal. Skin:  No bruising, abrasions, or lacerations.  Radiology:  Dg Ribs Unilateral W/chest Right  07/13/2011  *RADIOLOGY REPORT*  Clinical Data: Fall with anterior chest pain.  RIGHT RIBS AND CHEST - 3+ VIEW  Comparison: 04/26/2010.  Findings: Trachea is midline.  Heart size stable.  Linear atelectasis and/or scarring at both lung bases.  Lungs are otherwise clear.  No pleural fluid.  Dedicated views of the right ribs show no definite fracture.  IMPRESSION:  1.  Bibasilar atelectasis and/or scarring. 2.  No definite acute rib fracture.  Original Report Authenticated By: Reyes Ivan, M.D.   Dg Knee Complete 4  Views Left  07/13/2011  *RADIOLOGY REPORT*  Clinical Data: Larey Seat.  Left knee pain.  LEFT KNEE - COMPLETE 4+ VIEW  Comparison: None  Findings: There are moderate tricompartmental degenerative changes but no acute fracture or osteochondral lesion.  Osteoporosis is noted.  A tiny joint effusion is noted.  There is anterior soft tissue swelling.  IMPRESSION:  1.  Moderate tricompartmental degenerative changes. 2.  No acute fracture. 3.  Small joint effusion.  Original Report  Authenticated By: P. Loralie Champagne, M.D.    Assessment:   Diagnoses that have been ruled out:  None  Diagnoses that are still under consideration:  None  Final diagnoses:  Contusion of left knee  Contusion of ribs    Plan:   1.  The following meds were prescribed:   New Prescriptions   No medications on file   The patient will use Tylenol for the pain and apply ice to the knee. 2.  The patient was instructed in symptomatic care and handouts were given. 3.  The patient was told to return if becoming worse in any way, if no better in 3 or 4 days, and given some red flag symptoms that would indicate earlier return.    Roque Lias, MD 07/13/11 [phone]

## 2011-07-13 NOTE — Discharge Instructions (Signed)
Blunt Chest Trauma Chest injury caused by blunt trauma can be very painful. The pain is worse with movement or breathing. Most often these injuries result in bruised or fractured ribs. Minor rib fractures may not be seen on the initial X-rays. Most broken and bruised ribs from blunt chest injuries are not serious and get better within 1 to 3 weeks with rest and mild pain medicine. Internal organs (heart, lungs) can be injured with blunt trauma. Further examination may be needed. Limit activities until you can move around without much pain. Do not do any strenuous work until your injury is healed. Ice packs may be put on the injured area for 20 to 30 minutes several times daily to reduce pain. Rib belts may also be used to reduce pain and should be worn as directed by your caregiver. Take several deep breaths hourly to keep your lungs clear.  SEEK IMMEDIATE MEDICAL CARE IF:  You devContusion A bruise (contusion) or hematoma is a collection of blood under skin causing an area of discoloration. It is caused by an injury to blood vessels beneath the injured area with a release of blood into that area. As blood accumulates it is known as a hematoma. This collection of blood causes a blue to dark blue color. As the injury improves over days to weeks it turns to a yellowish color and then usually disappears completely over the same period of time. These generally resolve completely without problems. The hematoma rarely requires drainage. HOME CARE INSTRUCTIONS  Apply ice to the injured area for 15 to 20 minutes 3 to 4 times per day for the first 1 or 2 days.  Put the ice in a plastic bag and place a towel between the bag of ice and your skin. Discontinue the ice if it causes pain.  If bleeding is more than just a little, apply pressure to the area for at least thirty minutes to decrease the amount of bruising. Apply pressure and ice as your caregiver suggests.  If the injury is on an extremity, elevation of that  part may help to decrease pain and swelling. Wrapping with an ace or supportive wrap may also be helpful. If the bruise is on a lower extremity and is painful, crutches may be helpful for a couple days.  If you have been given a tetanus shot because the skin was broken, your arm may get swollen, red and warm to touch at the shot site. This is a normal response to the medicine in the shot. If you did not receive a tetanus shot today because you did not recall when your last one was given, make sure to check with your caregiver's office and determine if one is needed. Generally for a "dirty" wound, you should receive a tetanus booster if you have not had one in the last five years. If you have a "clean" wound, you should receive a tetanus booster if you have not had one within the last ten years.  SEEK MEDICAL CARE IF:  You have pain not controlled with over the counter medications. Only take over-the-counter or prescription medicines for pain, discomfort, or fever as directed by your caregiver. Do not use aspirin as it may cause bleeding.  You develop increasing pain or swelling in the area of injury.  You develop any problems which seem worse than the problems which brought you in.  SEEK IMMEDIATE MEDICAL CARE IF:  You have a fever.  You develop severe pain in the area of the  bruise out of proportion to the initial injury.  The bruised area becomes red, tender, and swollen.  MAKE SURE YOU:  Understand these instructions.  Will watch your condition.  Will get help right away if you are not doing well or get worse.  Document Released: 02/18/2005 Document Revised: 01/21/2011 Document Reviewed: 12/28/2007  [facility] Patient Information 2012 [place], [place].elop increased pain, shortness of breath, or cough up blood.   You develop nausea, vomiting, or abdominal pain.   You develop fever, dizziness, weakness, or fainting.  Document Released: 06/18/2004 Document Revised: 01/21/2011 Document Reviewed:  05/11/2005 [facility] Patient Information 2012 [place], [place].

## 2011-07-13 NOTE — ED Notes (Signed)
Pt reports she tripped this am in her living room.  Reports pain and swelling to left knee, right wrist and right ankle.  Also reports pain under right breast.

## 2011-07-19 ENCOUNTER — Other Ambulatory Visit: Payer: Self-pay | Admitting: Pulmonary Disease

## 2011-07-23 ENCOUNTER — Telehealth: Payer: Self-pay | Admitting: Pulmonary Disease

## 2011-07-23 MED ORDER — DICYCLOMINE HCL 10 MG PO CAPS: 10.0000 mg | ORAL_CAPSULE | Freq: Three times a day (TID) | ORAL | Status: DC

## 2011-07-23 NOTE — Telephone Encounter (Signed)
LMOM that refill for Dicyclomine was sent to [facility] on [address].

## 2011-07-28 ENCOUNTER — Other Ambulatory Visit: Payer: Self-pay | Admitting: Pulmonary Disease

## 2011-07-28 MED ORDER — DICYCLOMINE HCL 10 MG PO CAPS: 10.0000 mg | ORAL_CAPSULE | Freq: Three times a day (TID) | ORAL | Status: DC

## 2011-08-24 ENCOUNTER — Ambulatory Visit (INDEPENDENT_AMBULATORY_CARE_PROVIDER_SITE_OTHER): Payer: Medicare Other | Admitting: Pulmonary Disease

## 2011-08-24 ENCOUNTER — Other Ambulatory Visit (INDEPENDENT_AMBULATORY_CARE_PROVIDER_SITE_OTHER): Payer: Medicare Other

## 2011-08-24 ENCOUNTER — Encounter: Payer: Self-pay | Admitting: Pulmonary Disease

## 2011-08-24 VITALS — BP 132/76 | HR 67 | Temp 97.3°F | Ht 65.0 in | Wt 185.8 lb

## 2011-08-24 DIAGNOSIS — K5730 Diverticulosis of large intestine without perforation or abscess without bleeding: Secondary | ICD-10-CM

## 2011-08-24 DIAGNOSIS — E785 Hyperlipidemia, unspecified: Secondary | ICD-10-CM

## 2011-08-24 DIAGNOSIS — E119 Type 2 diabetes mellitus without complications: Secondary | ICD-10-CM

## 2011-08-24 DIAGNOSIS — K589 Irritable bowel syndrome without diarrhea: Secondary | ICD-10-CM

## 2011-08-24 DIAGNOSIS — I2510 Atherosclerotic heart disease of native coronary artery without angina pectoris: Secondary | ICD-10-CM

## 2011-08-24 DIAGNOSIS — F411 Generalized anxiety disorder: Secondary | ICD-10-CM

## 2011-08-24 DIAGNOSIS — E559 Vitamin D deficiency, unspecified: Secondary | ICD-10-CM

## 2011-08-24 DIAGNOSIS — I10 Essential (primary) hypertension: Secondary | ICD-10-CM

## 2011-08-24 DIAGNOSIS — I872 Venous insufficiency (chronic) (peripheral): Secondary | ICD-10-CM

## 2011-08-24 DIAGNOSIS — M1990 Unspecified osteoarthritis, unspecified site: Secondary | ICD-10-CM

## 2011-08-24 LAB — HEPATIC FUNCTION PANEL
ALT: 12 U/L (ref 0–35)
AST: 15 U/L (ref 0–37)
Albumin: 4.1 g/dL (ref 3.5–5.2)
Alkaline Phosphatase: 68 U/L (ref 39–117)
Bilirubin, Direct: 0.1 mg/dL (ref 0.0–0.3)
Total Bilirubin: 0.1 mg/dL — ABNORMAL LOW (ref 0.3–1.2)
Total Protein: 7.5 g/dL (ref 6.0–8.3)

## 2011-08-24 LAB — CBC WITH DIFFERENTIAL/PLATELET
Basophils Absolute: 0 10*3/uL (ref 0.0–0.1)
Basophils Relative: 0.7 % (ref 0.0–3.0)
Eosinophils Absolute: 0.4 10*3/uL (ref 0.0–0.7)
Eosinophils Relative: 5 % (ref 0.0–5.0)
HCT: 41.4 % (ref 36.0–46.0)
Hemoglobin: 13.8 g/dL (ref 12.0–15.0)
Lymphocytes Relative: 29.1 % (ref 12.0–46.0)
Lymphs Abs: 2.1 10*3/uL (ref 0.7–4.0)
MCHC: 33.2 g/dL (ref 30.0–36.0)
MCV: 93.3 fl (ref 78.0–100.0)
Monocytes Absolute: 0.7 10*3/uL (ref 0.1–1.0)
Monocytes Relative: 9.4 % (ref 3.0–12.0)
Neutro Abs: 4.1 10*3/uL (ref 1.4–7.7)
Neutrophils Relative %: 55.8 % (ref 43.0–77.0)
Platelets: 250 10*3/uL (ref 150.0–400.0)
RBC: 4.44 Mil/uL (ref 3.87–5.11)
RDW: 14.1 % (ref 11.5–14.6)
WBC: 7.4 10*3/uL (ref 4.5–10.5)

## 2011-08-24 LAB — LIPID PANEL
LDL Cholesterol: 110 mg/dL — ABNORMAL HIGH (ref 0–99)
Total CHOL/HDL Ratio: 3
VLDL: 22.6 mg/dL (ref 0.0–40.0)

## 2011-08-24 LAB — TSH: TSH: 2.64 u[IU]/mL (ref 0.35–5.50)

## 2011-08-24 LAB — BASIC METABOLIC PANEL
Chloride: 102 mEq/L (ref 96–112)
GFR: 66.63 mL/min (ref >60.00)
Potassium: 4.2 mEq/L (ref 3.5–5.1)
Sodium: 141 mEq/L (ref 135–145)

## 2011-08-24 LAB — HEMOGLOBIN A1C: Hgb A1c MFr Bld: 6.5 % (ref 4.6–6.5)

## 2011-08-24 NOTE — Progress Notes (Addendum)
Subjective:    Patient ID: Nichole Silva, female    DOB: [birth year], 77 y.o.   MRN: [id number]  HPI 77 y/o WF here for a follow up visit...she has mult med problems as noted below...   ~  February 24, 2010:  still working regularly at [employer]... she saw DrDBrodie 4/11 for IBS, divertics, fecal incontinence w/ decr rectal sphincter tone- she added Bentyl & [facility] & improved... BP remains under good control;  Lipids improved on current Rx;  & DM stable on 1 Metformin daily... OK Flu shot today...  ~  Sep 29, 2010:  37mo ROV & c/o rash & swelling in legs> she has VI, edema, dermatitis, etc; she went to [facility] & they stopped her Mobic; we discussed poss Derm eval but for now incr Lasix 40mg  Bid, NO SALT, ELEVATION, SUPPORT HOSE, & apply Hydrocort ointment bid as well... We will recheck pt in one month>  ~  November 04, 2010:  3554mo ROV & she reports marked improvement in her LE swelling, rash, dermatitis using the Hydrocortisone ointment & extra Lasix; wt is down 3# and BP stable at 130/80... We decided to check BMet on the Lasix40 dose ( looks OK- continue same dose).  ~  March 18, 2011:  554mo ROV & she is overall improved; swelling in legs about the same on Lasix40 1-2 daily & low sodium diet; BP controlled on meds, weight is up 4# to 184# today; she denies CP, palpit, incr SOB, or ch in edema; we reviewed labs & lipids stable on Simva & Welchol; GI stable as well & we decided to wait on f/u FASTING blood work til return in 4-52mo...  ~  August 24, 2011:  54mo ROV & Nichole Silva tripped at home last month w/ injury to left knee & ribs; seen in ER w/ knee XRay showing degen changes but no fx; similarly the CXR was neg x some basilar atx; rest/ heat/ Tylenol & she is feeling better... Still works part time at [employer]...    HBP> on Nifedipine60 & Lasix40-2/d; BP= 132/76 & she denies CP, palpit, SOB, etc...    CAD> on ASA81; she is too sedentary but occas has to be on her feet for hrs at [employer] job; denies angina, etc...    VI> on low sodium, elevation, support hose & Lasix40 taking 1-2 daily; she has some chronic edema & manages satis...    Chol> on Simva80 & Welchol2/d; FLP showed TChol     DM> on Metform500/d & diet; Labs showed BS=     GII> IBS> on Bentyl10 prn; she has occas diarrhea & finds that 1 immodium will usually do the trick...    DJD> left knee pain since her fall & XRay w/ degen changes; she prefers just Tylenol prn & manages well...    VitD defic> on VitD 50K weekly; I cannot find prev mammograms/ BMD/ etc;     Anxiety> on Xanax prn; incr stress w/ daugh etoh/drugs age 55 now doing better... LABS 4/13:  FLP- ok on Simva80 x LDL 110;  Chems- ok x BS=118 A1c=6.5 on Metform;  CBC- wnl;  TSH=2.64;  VitD=20 on 50K weekly...   Problem List:  DECREASED HEARING (ICD-389.9) - she has digital hearing aides bilat...  ALLERGIC RHINITIS (ICD-477.9) - on ZYRTEK daily & FLONASE Qhs...  Hx of BRONCHITIS, RECURRENT (ICD-491.9) - she had URI & went to [facility] recently w/ "virus" diagnosed...  HYPERTENSION (ICD-401.9) - on PROCARDIA XL 60mg /d & LASIX 40mg  1-2 daily... BP=132/76  today, and feeling well... BP's at home are similar range... denies HA, fatigue, visual changes, CP, palipit, dizziness, syncope, dyspnea, edema, etc... ~  CXR 4/11 showed mild basilar atelectasis, NAD.Marland Kitchen. ~  5/12:  We decided to incr LASIX to 40mg  Bid due to edema & recheck... ~  10/12:  BP stable 7 chr edema improved w/ Lasix40mg  taking 1-2 daily...  CORONARY ARTERY DISEASE (ICD-414.00) - on ASA 81mg /d... no CP, palpit, or change in SOB. ~  cath 1989 by DrStuckey showed non-obstructive dis w/ 40-50% LAD lesion, otherw norm... ~  EKG shows NSR, NSSTTWA...  VENOUS INSUFFICIENCY (ICD-459.81) - she knows to elim sodium, elevate legs, wear support hose, & take the LASIX 40mg  ==> incr to Bid. ~  6/12:  Edema improved & wt down 3# on the Lasix 80mg /d; BUN=10, Creat=1.0, continue same dose...  HYPERLIPIDEMIA (ICD-272.4) - on SIMVASTATIN 80mg /d  now... ~  FLP 9/08 on Vytorin 10-80 showed TChol 143, TG 100, HDL 44, LDL 79 ~  FLP 4/09 on Vytorin still showed TChol 153, TG 95, HDL 42, LDL 92... changed to Simva80 for $$. ~  FLP 10/09 still on the Vytorin showed TChol 166, TG 134, HDL 46, LDL 94 ~  FLP 4/10 on Simva80 showed TChol 203, TG 141, HDL 47, LDL 131... rec> same + diet! ~  FLP 10/10 on Simva80 showed TChol 176, TG 139, HDL 41, LDL 107 ~  FLP 4/11 showed TChol 213, TG 225, HDL 50, LDL 130... consider change med, ?add zetia, for now= BETTER DIET! ~  FLP 10/11 on Simva80+Welchol2 showed TChol 185, TG 232, HDL 53, LDL 103 ~  FLP 5/12 on Simva80+Welchol2 showed TChol 155, TG 111, HDL 43, LDL 90... Great job! ~  FLP 4/13 on Simva80+Welchol2 showed TChol 187, TG 113, HDL 55, LDL 110  DIABETES MELLITUS (ICD-250.00) - on METFORMIN 500mg /d and diet... ~  labs 9/08 showed BS=121, HgA1c=6.3 ~  labs 4/09 showed BS= 111, HgA1c= 6.5.Marland KitchenMarland Kitchen continue same Rx. ~  labs 9/09 (wt=192#) showed BS= 131, HgA1c= 6.6.Marland Kitchen. same. ~  Labs 4/10 (wt=190#) showed BS= 122, A1c= 6.5 ~  labs 10/10 (wt=194#) showed BS= 120, A1c= 6.5 ~  labs 4/11 (wt=186#) showed BS= 124, A1c= 6.6 ~  labs 10/11 (wt=181#) showed BS= 115, A1c= 6.4 ~  Labs 5/12 (wt=182#) showed BS= 105, A1c= 6.8 ~  Labs 4/13 (wt=186#) showed BS= 110, A1c= 6.5  IRRITABLE BOWEL SYNDROME (ICD-564.1) - she uses BENTYL 20mg  as needed... she tells me she wears depends for diarrhea prob. ~  last colonoscopy 2/02 by DrBrodie showed divertics only... f/u planned 50yrs. ~  4/11:  DrBrodie incr Bentyl to Tid & added WELCHOL 2tabs/d (diarrhea & incontinence improved) ~  4/13:  She is still taking the Welchol 2/d "it helps my IBS" she says...  DEGENERATIVE JOINT DISEASE (ICD-715.90) - on MOBIC 7.5mg  Prn...  VITAMIN D DEFICIENCY (ICD-268.9) - on Vit D 50000 u daily... ~  labs 4/10 showed Vit D level = 6... therefore started on Vit D 50000 u weekly. ~  labs 4/11 showed Vit D level = 38... OK to change to 2000 u  daily. ~  Labs 5/12 showed Vit D level = 25... Change back to VitD 50K weekly. ~  Labs 4/13 showed Vit D level = 20... Asked to take VitD 50K every week!  ANXIETY (ICD-300.00) - on ALPRAZOLAM 0.5mg  Prn... she's under alot of stress w/ her husb and a 73 y/o nephew dx w/ Marfan's, s/p valve surg, needs heart transplant...  GYN =  DrMcPhail w/ BMD 1/06 @ [facility] showing norm w/ TScores -0.3 to -0.8.Marland KitchenMarland Kitchen   Past Surgical History  Procedure Date  . Dilation and curettage of uterus   . Cholecystectomy   . Left elbow surgery   . Cataract surg     Outpatient Encounter Prescriptions as of 08/24/2011  Medication Sig Dispense Refill  . ALPRAZolam (XANAX) 0.5 MG tablet TAKE 1 TABLET BY MOUTH THREE TIMES DAILY AS NEEDED FOR NERVES  90 tablet  5  . aspirin 81 MG tablet Take 81 mg by mouth daily.        . cetirizine (ZYRTEC) 10 MG tablet Take 10 mg by mouth daily.        Marland Kitchen dicyclomine (BENTYL) 10 MG capsule Take 1 capsule (10 mg total) by mouth 3 (three) times daily before meals.  90 capsule  3  . ergocalciferol (VITAMIN D2) 50000 UNITS capsule Take 50,000 Units by mouth once a week.        . fluticasone (FLONASE) 50 MCG/ACT nasal spray INHALE 2 SPRAYS IN EACH NOSTRIL AT BEDTIME  1 Act  6  . furosemide (LASIX) 40 MG tablet Take 2 tablets daily  60 tablet  11  . guaiFENesin (MUCINEX) 600 MG 12 hr tablet Take 1,200 mg by mouth 2 (two) times daily.        . hydrocortisone 1 % ointment Apply 1 application topically 2 (two) times daily.  56 g  5  . metFORMIN (GLUCOPHAGE) 500 MG tablet TAKE 1 TABLET BY MOUTH EVERY DAY  30 tablet  PRN  . NIFEDICAL XL 60 MG 24 hr tablet TAKE 1 TABLET BY MOUTH EVERY DAY  30 tablet  PRN  . simvastatin (ZOCOR) 80 MG tablet TAKE 1 TABLET BY MOUTH AT BEDTIME  30 tablet  PRN  . WELCHOL 625 MG tablet TAKE 2 TABLETS BY MOUTH DAILY  60 tablet  11    Allergies  Allergen Reactions  . Penicillins     REACTION: ITCHING AND SWELLING    Current Medications, Allergies, Past Medical History,  Past Surgical History, Family History, and Social History were reviewed in [employer] record.   Review of Systems        See HPI - all other systems neg except as noted... The patient complains of decreased hearing and dyspnea on exertion.  The patient denies anorexia, fever, weight loss, weight gain, vision loss, hoarseness, chest pain, syncope, peripheral edema, prolonged cough, headaches, hemoptysis, abdominal pain, melena, hematochezia, severe indigestion/heartburn, hematuria, incontinence, muscle weakness, suspicious skin lesions, transient blindness, difficulty walking, depression, unusual weight change, abnormal bleeding, enlarged lymph nodes, and angioedema.     Objective:   Physical Exam     WD, Overweight, 77 y/o WF in NAD... Vital Signs:  Reviewed... GENERAL:  Alert & oriented; pleasant & cooperative... HEENT:  [place]/AT, EOM-wnl, PERRLA, EACs-clear, TMs-wnl, NOSE-clear, THROAT-clear & wnl. NECK:  Supple w/ fairROM; no JVD; normal carotid impulses w/o bruits; no thyromegaly or nodules palpated; no lymphadenopathy. CHEST:  Clear to P & A; without wheezes/ rales/ or rhonchi. HEART:  Regular Rhythm; without murmurs/ rubs/ or gallops. ABDOMEN:  Soft & nontender; normal bowel sounds; no organomegaly or masses detected. EXT: without deformities, mild arthritic changes; no varicose veins/ +venous insuffic/ 1+edema (chronic) NEURO:  CN's intact;  no focal neuro deficits... DERM:  chronic dry skin dermatitis on legs...  RADIOLOGY DATA:  Reviewed in the EPIC EMR & discussed w/ the patient...  LABORATORY DATA:  Reviewed in the EPIC EMR & discussed  w/ the patient...   Assessment & Plan:   HBP>  Controlled on CCB & Diuretic; continue same...  CAD>  Denies angina etc but she is too sedentary; rec incr exercise, continue ASA, continue BP meds, statin, DM rx etc...  VI/ Edema/ Dermatitis>  Improved w/ Lasix40 taking 1-2 daily & BMet looks OK- continue same;  dermatitis much improved w/ the HC ointment...  LIPIDS>  Stable on Simva, continue same for now> needs better diet, get wt down...  DM>  Stable on the Metformin, needs better diet, get wt down...  Vit D defic>  rec to return to Vit D Rx 50K weekly.Marland KitchenMarland Kitchen

## 2011-08-24 NOTE — Patient Instructions (Signed)
Today we updated your med list in our EPIC system...    Continue your current medications the same...  Today we did your FASTING blood work...    We will call you w/ the results later this week...  Let's get on track w/ our diet & exercise program...    The goal is to lose 10-15 lbs...  Let's plan a follow up visit in 6 months, sooner if needed for problems.Marland KitchenMarland Kitchen

## 2011-08-25 LAB — VITAMIN D 25 HYDROXY (VIT D DEFICIENCY, FRACTURES): Vit D, 25-Hydroxy: 20 ng/mL — ABNORMAL LOW (ref 30–89)

## 2011-08-26 ENCOUNTER — Telehealth: Payer: Self-pay | Admitting: Pulmonary Disease

## 2011-08-26 NOTE — Telephone Encounter (Signed)
I spoke with Nichole Silva and made her aware of results. She voiced her understanding and had no questions

## 2011-08-26 NOTE — Telephone Encounter (Signed)
Notes Recorded by Michele Mcalpine, MD on 08/25/2011 at 8:58 AM Pt notified by Leigh... SM[facility] FLP stable on Simva80 (top dose) & LDL=110> needs beeter diet, get wt down! Chems are ok w/ BS=118, A1c=6.5 on Metform500/s> keep same, better diet, etc... CBC/ TSH> wnl; Vit D still low on 50K weekly> be sure to take every wk, keep same...   lmomtcb x1

## 2011-08-26 NOTE — Telephone Encounter (Signed)
Pt's [name] returned call from triage. Nichole Silva

## 2011-08-31 ENCOUNTER — Other Ambulatory Visit: Payer: Self-pay | Admitting: Pulmonary Disease

## 2011-09-08 ENCOUNTER — Other Ambulatory Visit: Payer: Self-pay | Admitting: Pulmonary Disease

## 2011-10-10 ENCOUNTER — Other Ambulatory Visit: Payer: Self-pay | Admitting: Pulmonary Disease

## 2011-10-15 ENCOUNTER — Other Ambulatory Visit: Payer: Self-pay | Admitting: Pulmonary Disease

## 2011-10-24 ENCOUNTER — Other Ambulatory Visit: Payer: Self-pay | Admitting: Pulmonary Disease

## 2012-01-14 ENCOUNTER — Other Ambulatory Visit: Payer: Self-pay | Admitting: Pulmonary Disease

## 2012-02-09 ENCOUNTER — Encounter: Payer: Self-pay | Admitting: Internal Medicine

## 2012-02-23 ENCOUNTER — Ambulatory Visit (INDEPENDENT_AMBULATORY_CARE_PROVIDER_SITE_OTHER): Payer: Medicare Other | Admitting: Pulmonary Disease

## 2012-02-23 ENCOUNTER — Encounter: Payer: Self-pay | Admitting: Pulmonary Disease

## 2012-02-23 VITALS — BP 130/78 | HR 67 | Temp 97.2°F | Ht 65.0 in | Wt 184.4 lb

## 2012-02-23 DIAGNOSIS — E559 Vitamin D deficiency, unspecified: Secondary | ICD-10-CM

## 2012-02-23 DIAGNOSIS — I10 Essential (primary) hypertension: Secondary | ICD-10-CM

## 2012-02-23 DIAGNOSIS — K5730 Diverticulosis of large intestine without perforation or abscess without bleeding: Secondary | ICD-10-CM

## 2012-02-23 DIAGNOSIS — E119 Type 2 diabetes mellitus without complications: Secondary | ICD-10-CM

## 2012-02-23 DIAGNOSIS — F411 Generalized anxiety disorder: Secondary | ICD-10-CM

## 2012-02-23 DIAGNOSIS — Z23 Encounter for immunization: Secondary | ICD-10-CM

## 2012-02-23 DIAGNOSIS — K589 Irritable bowel syndrome without diarrhea: Secondary | ICD-10-CM

## 2012-02-23 DIAGNOSIS — M1990 Unspecified osteoarthritis, unspecified site: Secondary | ICD-10-CM

## 2012-02-23 DIAGNOSIS — I872 Venous insufficiency (chronic) (peripheral): Secondary | ICD-10-CM

## 2012-02-23 DIAGNOSIS — E785 Hyperlipidemia, unspecified: Secondary | ICD-10-CM

## 2012-02-23 DIAGNOSIS — R609 Edema, unspecified: Secondary | ICD-10-CM

## 2012-02-23 DIAGNOSIS — I2510 Atherosclerotic heart disease of native coronary artery without angina pectoris: Secondary | ICD-10-CM

## 2012-02-23 NOTE — Progress Notes (Signed)
Subjective:    Patient ID: Nichole Silva, female    DOB: 1934-08-17, 77 y.o.   MRN: [id number]  HPI 77 y/o WF here for a follow up visit...she has mult med problems as noted below...   ~  Sep 29, 2010:  631mo ROV & c/o rash & swelling in legs> she has VI, edema, dermatitis, etc; she went to [facility] & they stopped her Mobic; we discussed poss Derm eval but for now incr Lasix 40mg  Bid, NO SALT, ELEVATION, SUPPORT HOSE, & apply Hydrocort ointment bid as well... We will recheck pt in one month>  ~  November 04, 2010:  89mo ROV & she reports marked improvement in her LE swelling, rash, dermatitis using the Hydrocortisone ointment & extra Lasix; wt is down 3# and BP stable at 130/80... We decided to check BMet on the Lasix40 dose ( looks OK- continue same dose).  ~  March 18, 2011:  31mo ROV & she is overall improved; swelling in legs about the same on Lasix40 1-2 daily & low sodium diet; BP controlled on meds, weight is up 4# to 184# today; she denies CP, palpit, incr SOB, or ch in edema; we reviewed labs & lipids stable on Simva & Welchol; GI stable as well & we decided to wait on f/u FASTING blood work til return in 4-20mo...  ~  August 24, 2011:  40mo ROV & Nichole Silva tripped at home last month w/ injury to left knee & ribs; seen in ER w/ knee XRay showing degen changes but no fx; similarly the CXR was neg x some basilar atx; rest/ heat/ Tylenol & she is feeling better... Still works part time at [employer]...    HBP> on Nifedipine60 & Lasix40-2/d; BP= 132/76 & she denies CP, palpit, SOB, etc...    CAD> on ASA81; she is too sedentary but occas has to be on her feet for hrs at [employer] job; denies angina, etc...    VI> on low sodium, elevation, support hose & Lasix40 taking 1-2 daily; she has some chronic edema & manages satis...    Chol> on Simva80 & Welchol2/d; FLP showed TChol     DM> on Metform500/d & diet; Labs showed BS=     GII> IBS> on Bentyl10 prn; she has occas diarrhea & finds that 1 immodium will usually do  the trick...    DJD> left knee pain since her fall & XRay w/ degen changes; she prefers just Tylenol prn & manages well...    VitD defic> on VitD 50K weekly; Nichole Silva/ BMD/ etc;     Anxiety> on Xanax prn; incr stress w/ daugh etoh/drugs age 23 now doing better... LABS 4/13:  FLP- ok on Simva80 x LDL 110;  Chems- ok x BS=118 A1c=6.5 on Metform;  CBC- wnl;  TSH=2.64;  VitD=20 on 50K weekly...  ~  February 23, 2012:  631mo ROV & Nichole Silva has had a good interval- no new complaints or concerns;  She has had some difficulty w/ her hearing aides & is working w/ [employer];  BP is controlled on her Procardia & Lasix, w/ persist 2-3+ edema/ VI changes- she knows to elim salt & wears support hose, weight is unchanged at 184-5#;  She denies CP, palpit, SOB, etc;  Lipids & DM management under good control w/ Simva80, Welchol2/d, & Metform500;  She credits the [facility] w/ helping her IBS as well- she is due for Colonoscopy & will call DrDBrodie to set this up...    We  reviewed prob list, meds, xrays and labs> see below for updates >> OK 2013 Flu vaccine today...    Problem List:  DECREASED HEARING (ICD-389.9) - she has digital hearing aides bilat & is working thru [employer]...  ALLERGIC RHINITIS (ICD-477.9) - on ZYRTEK daily & FLONASE Qhs...  Hx of BRONCHITIS, RECURRENT (ICD-491.9) - she denies recent URIs or resp exac, takes [facility] as needed...  HYPERTENSION (ICD-401.9) - on PROCARDIA XL 60mg /d & LASIX 40mg  1-2 daily...  ~  CXR 4/11 showed mild basilar atelectasis, NAD.Marland Kitchen. ~  5/12:  We decided to incr LASIX to 40mg  Bid due to edema & recheck... ~  10/12:  BP stable 7 chr edema improved w/ Lasix40mg  taking 1-2 daily... ~  4/13:  BP=132/76 today, and feeling well... BP's at home are similar range... denies HA, fatigue, visual changes, CP, palipit, dizziness, syncope, dyspnea, edema, etc... ~  10/13:  BP= 130/78 & feeling well; denies CP, palpit, SOB, ch in edema, etc...  CORONARY  ARTERY DISEASE (ICD-414.00) - on ASA 81mg /d... no CP, palpit, or change in SOB. ~  cath 1989 by DrStuckey showed non-obstructive dis w/ 40-50% LAD lesion, otherw norm... ~  EKG shows NSR, NSSTTWA...  VENOUS INSUFFICIENCY (ICD-459.81) - she knows to elim sodium, elevate legs, wear support hose, & take the LASIX 40mg  ==> incr to Bid. ~  6/12:  Edema improved & wt down 3# on the Lasix 80mg /d; BUN=10, Creat=1.0, continue same dose... ~  4/13:  Edema stable & wt 185# on Lasix40- taking 1-2 tabs daily; BUN= 9, Creat= 0.9  HYPERLIPIDEMIA (ICD-272.4) - on SIMVASTATIN 80mg /d now... ~  FLP 9/08 on Vytorin 10-80 showed TChol 143, TG 100, HDL 44, LDL 79 ~  FLP 4/09 on Vytorin still showed TChol 153, TG 95, HDL 42, LDL 92... changed to Simva80 for $$. ~  FLP 10/09 still on the Vytorin showed TChol 166, TG 134, HDL 46, LDL 94 ~  FLP 4/10 on Simva80 showed TChol 203, TG 141, HDL 47, LDL 131... rec> same + diet! ~  FLP 10/10 on Simva80 showed TChol 176, TG 139, HDL 41, LDL 107 ~  FLP 4/11 showed TChol 213, TG 225, HDL 50, LDL 130... consider change med, ?add zetia, for now= BETTER DIET! ~  FLP 10/11 on Simva80+Welchol2 showed TChol 185, TG 232, HDL 53, LDL 103 ~  FLP 5/12 on Simva80+Welchol2 showed TChol 155, TG 111, HDL 43, LDL 90... Great job! ~  FLP 4/13 on Simva80+Welchol2 showed TChol 187, TG 113, HDL 55, LDL 110  DIABETES MELLITUS (ICD-250.00) - on METFORMIN 500mg /d and diet... ~  labs 9/08 showed BS=121, HgA1c=6.3 ~  labs 4/09 showed BS= 111, HgA1c= 6.5.Marland KitchenMarland Kitchen continue same Rx. ~  labs 9/09 (wt=192#) showed BS= 131, HgA1c= 6.6.Marland Kitchen. same. ~  Labs 4/10 (wt=190#) showed BS= 122, A1c= 6.5 ~  labs 10/10 (wt=194#) showed BS= 120, A1c= 6.5 ~  labs 4/11 (wt=186#) showed BS= 124, A1c= 6.6 ~  labs 10/11 (wt=181#) showed BS= 115, A1c= 6.4 ~  Labs 5/12 (wt=182#) showed BS= 105, A1c= 6.8 ~  Labs 4/13 (wt=186#) showed BS= 110, A1c= 6.5  IRRITABLE BOWEL SYNDROME (ICD-564.1) - she uses BENTYL 20mg  as needed... she  tells me she wears depends for diarrhea prob. ~  last colonoscopy 2/02 by DrBrodie showed divertics only... f/u planned 32yrs. ~  4/11:  DrBrodie incr Bentyl to Tid & added WELCHOL 2tabs/d (diarrhea & incontinence improved) ~  4/13:  She is still taking the Welchol 2/d "it helps my IBS" she  says.Marland KitchenMarland Kitchen  DEGENERATIVE JOINT DISEASE (ICD-715.90) - on MOBIC 7.5mg  Prn...  VITAMIN D DEFICIENCY (ICD-268.9) - on Vit D 50000 u daily... ~  labs 4/10 showed Vit D level = 6... therefore started on Vit D 50000 u weekly. ~  labs 4/11 showed Vit D level = 38... OK to change to 2000 u daily. ~  Labs 5/12 showed Vit D level = 25... Change back to VitD 50K weekly. ~  Labs 4/13 showed Vit D level = 20... Asked to take VitD 50K every week!  ANXIETY (ICD-300.00) - on ALPRAZOLAM 0.5mg  Prn... she's under alot of stress w/ her husb and a 87 y/o nephew dx w/ Marfan's, s/p valve surg, needs heart transplant...  GYN = DrMcPhail w/ BMD 1/06 @ SER showing norm w/ TScores -0.3 to -0.8.Marland KitchenMarland Kitchen   Past Surgical History  Procedure Date  . Dilation and curettage of uterus   . Cholecystectomy   . Left elbow surgery   . Cataract surg     Outpatient Encounter Prescriptions as of 02/23/2012  Medication Sig Dispense Refill  . ALPRAZolam (XANAX) 0.5 MG tablet TAKE 1 TABLET BY MOUTH THREE TIMES DAILY AS NEEDED  90 tablet  2  . aspirin 81 MG tablet Take 81 mg by mouth daily.        . cetirizine (ZYRTEC) 10 MG tablet Take 10 mg by mouth daily.        Marland Kitchen dicyclomine (BENTYL) 10 MG capsule Take 1 capsule (10 mg total) by mouth 3 (three) times daily before meals.  90 capsule  3  . ergocalciferol (VITAMIN D2) 50000 UNITS capsule Take 50,000 Units by mouth once a week.        . fluticasone (FLONASE) 50 MCG/ACT nasal spray USE 2 SPRAYS IN EACH NOSTRIL EVERY NIGHT AT BEDTIME  16 g  6  . furosemide (LASIX) 40 MG tablet Take 2 tablets daily  60 tablet  11  . guaiFENesin (MUCINEX) 600 MG 12 hr tablet Take 1,200 mg by mouth 2 (two) times daily.         . hydrocortisone 1 % ointment APPLY TO THE AFFECTED AREA TWICE DAILY  56.7 g  0  . metFORMIN (GLUCOPHAGE) 500 MG tablet TAKE 1 TABLET BY MOUTH EVERY DAY  30 tablet  PRN  . NIFEDICAL XL 60 MG 24 hr tablet TAKE 1 TABLET BY MOUTH EVERY DAY  30 tablet  PRN  . simvastatin (ZOCOR) 80 MG tablet TAKE 1 TABLET BY MOUTH AT BEDTIME  30 tablet  PRN  . WELCHOL 625 MG tablet TAKE 2 TABLETS BY MOUTH DAILY  60 tablet  11    Allergies  Allergen Reactions  . Penicillins     REACTION: ITCHING AND SWELLING    Current Medications, Allergies, Past Medical History, Past Surgical History, Family History, and Social History were reviewed in [employer] record.   Review of Systems        See HPI - all other systems neg except as noted... The patient complains of decreased hearing and dyspnea on exertion.  The patient denies anorexia, fever, weight loss, weight gain, vision loss, hoarseness, chest pain, syncope, peripheral edema, prolonged cough, headaches, hemoptysis, abdominal pain, melena, hematochezia, severe indigestion/heartburn, hematuria, incontinence, muscle weakness, suspicious skin lesions, transient blindness, difficulty walking, depression, unusual weight change, abnormal bleeding, enlarged lymph nodes, and angioedema.     Objective:   Physical Exam     WD, Overweight, 77 y/o WF in NAD... Vital Signs:  Reviewed... GENERAL:  Alert &  oriented; pleasant & cooperative... HEENT:  [place]/AT, EOM-wnl, PERRLA, EACs-clear, TMs-wnl, NOSE-clear, THROAT-clear & wnl. NECK:  Supple w/ fairROM; no JVD; normal carotid impulses w/o bruits; no thyromegaly or nodules palpated; no lymphadenopathy. CHEST:  Clear to P & A; without wheezes/ rales/ or rhonchi. HEART:  Regular Rhythm; without murmurs/ rubs/ or gallops. ABDOMEN:  Soft & nontender; normal bowel sounds; no organomegaly or masses detected. EXT: without deformities, mild arthritic changes; no varicose veins/ +venous insuffic/ 1+edema  (chronic) NEURO:  CN's intact;  no focal neuro deficits... DERM:  chronic dry skin dermatitis on legs...  RADIOLOGY DATA:  Reviewed in the EPIC EMR & discussed w/ the patient...  LABORATORY DATA:  Reviewed in the EPIC EMR & discussed w/ the patient...   Assessment & Plan:    HBP>  Controlled on CCB & Diuretic; continue same...  CAD>  Denies angina etc but she is too sedentary; rec incr exercise, continue ASA, continue BP meds, statin, DM rx etc...  VI/ Edema/ Dermatitis>  Improved w/ Lasix40 taking 1-2 daily & BMet looks OK- continue same; dermatitis much improved w/ the HC ointment...  LIPIDS>  Stable on Simva, continue same for now> needs better diet, get wt down...  DM>  Stable on the Metformin, needs better diet, get wt down...  Vit D defic>  rec to return to Vit D Rx 50K weekly...   Patient's Medications  New Prescriptions   No medications on file  Previous Medications   ALPRAZOLAM (XANAX) 0.5 MG TABLET    TAKE 1 TABLET BY MOUTH THREE TIMES DAILY AS NEEDED   ASPIRIN 81 MG TABLET    Take 81 mg by mouth daily.     CETIRIZINE (ZYRTEC) 10 MG TABLET    Take 10 mg by mouth daily.     DICYCLOMINE (BENTYL) 10 MG CAPSULE    Take 1 capsule (10 mg total) by mouth 3 (three) times daily before meals.   ERGOCALCIFEROL (VITAMIN D2) 50000 UNITS CAPSULE    Take 50,000 Units by mouth once a week.     FLUTICASONE (FLONASE) 50 MCG/ACT NASAL SPRAY    USE 2 SPRAYS IN EACH NOSTRIL EVERY NIGHT AT BEDTIME   FUROSEMIDE (LASIX) 40 MG TABLET    Take 2 tablets daily   GUAIFENESIN (MUCINEX) 600 MG 12 HR TABLET    Take 1,200 mg by mouth 2 (two) times daily.     HYDROCORTISONE 1 % OINTMENT    APPLY TO THE AFFECTED AREA TWICE DAILY   METFORMIN (GLUCOPHAGE) 500 MG TABLET    TAKE 1 TABLET BY MOUTH EVERY DAY   NIFEDICAL XL 60 MG 24 HR TABLET    TAKE 1 TABLET BY MOUTH EVERY DAY   SIMVASTATIN (ZOCOR) 80 MG TABLET    TAKE 1 TABLET BY MOUTH AT BEDTIME   WELCHOL 625 MG TABLET    TAKE 2 TABLETS BY MOUTH DAILY    Modified Medications   No medications on file  Discontinued Medications   No medications on file

## 2012-02-23 NOTE — Patient Instructions (Addendum)
Today we updated your med list in our EPIC system...    Continue your current medications the same...  Today we gave you the 2013 Flu vaccine...  Everything looks good, keep up the good work, let's plan a recheck w/ FASTING blood work in 6 months...  Call for any problems.Marland KitchenMarland Kitchen

## 2012-03-07 ENCOUNTER — Encounter: Payer: Self-pay | Admitting: Internal Medicine

## 2012-03-14 ENCOUNTER — Telehealth: Payer: Self-pay | Admitting: Pulmonary Disease

## 2012-03-14 NOTE — Telephone Encounter (Signed)
Spoke with pt's daughter. She states that the pt felt like her "bladder was going to fall out" last night. She apparently pulled a muscle in her groin and then after this started to "feel like bladder is completely full". She states no urinary frequency, urgency, dysuria, pelvic pain or fever. She states that since she left the msg earlier, the pt has reported doing better and not having any problems. Will call back if needed.

## 2012-03-18 ENCOUNTER — Encounter (HOSPITAL_COMMUNITY): Payer: Self-pay | Admitting: *Deleted

## 2012-03-18 ENCOUNTER — Emergency Department (HOSPITAL_COMMUNITY)
Admission: EM | Admit: 2012-03-18 | Discharge: 2012-03-18 | Disposition: A | Discharge status: Home / Self Care | Payer: Medicare Other | Attending: Emergency Medicine | Admitting: Emergency Medicine

## 2012-03-18 ENCOUNTER — Telehealth: Payer: Self-pay | Admitting: Pulmonary Disease

## 2012-03-18 DIAGNOSIS — H9190 Unspecified hearing loss, unspecified ear: Secondary | ICD-10-CM | POA: Insufficient documentation

## 2012-03-18 DIAGNOSIS — E785 Hyperlipidemia, unspecified: Secondary | ICD-10-CM | POA: Insufficient documentation

## 2012-03-18 DIAGNOSIS — IMO0002 Reserved for concepts with insufficient information to code with codable children: Secondary | ICD-10-CM | POA: Insufficient documentation

## 2012-03-18 DIAGNOSIS — N390 Urinary tract infection, site not specified: Secondary | ICD-10-CM | POA: Insufficient documentation

## 2012-03-18 DIAGNOSIS — K589 Irritable bowel syndrome without diarrhea: Secondary | ICD-10-CM | POA: Insufficient documentation

## 2012-03-18 DIAGNOSIS — J42 Unspecified chronic bronchitis: Secondary | ICD-10-CM | POA: Insufficient documentation

## 2012-03-18 DIAGNOSIS — Z7982 Long term (current) use of aspirin: Secondary | ICD-10-CM | POA: Insufficient documentation

## 2012-03-18 DIAGNOSIS — Z79899 Other long term (current) drug therapy: Secondary | ICD-10-CM | POA: Insufficient documentation

## 2012-03-18 DIAGNOSIS — N814 Uterovaginal prolapse, unspecified: Secondary | ICD-10-CM | POA: Insufficient documentation

## 2012-03-18 LAB — URINE MICROSCOPIC-ADD ON

## 2012-03-18 LAB — URINALYSIS, ROUTINE W REFLEX MICROSCOPIC
Bilirubin Urine: NEGATIVE
Glucose, UA: NEGATIVE mg/dL
Hgb urine dipstick: NEGATIVE
Specific Gravity, Urine: 1.013 (ref 1.005–1.030)
Urobilinogen, UA: 0.2 mg/dL (ref 0.0–1.0)

## 2012-03-18 MED ORDER — SULFAMETHOXAZOLE-TMP DS 800-160 MG PO TABS: 1.0000 | ORAL_TABLET | Freq: Two times a day (BID) | ORAL | Status: DC

## 2012-03-18 NOTE — ED Notes (Signed)
Pt states she has some pressure in her lower abdomen. She has increased urination and she states she looked and felt in her vagina and felt something that was not normal.

## 2012-03-18 NOTE — ED Provider Notes (Signed)
History     CSN: [id number]  Arrival date & time 03/18/12  1410   First MD Initiated Contact with Patient 03/18/12 1746      Chief Complaint  Patient presents with  . Vaginal Prolapse    pt suspects a "uterine or bladder prolapse" as something is coming out of her vagina    HPI PT feels like her uterus or bladder is dropping.  She has also been urinating more than usual.  This started about a week ago.  Pt has been more active this week than usual.  She does not have a gynecologist right not so she came to the ED.  patient states she did look earlier today and thought she saw something protruding from her vagina. She does notice that the symptoms get worse when she is ambulate.  She also has noted some urinary discomfort Past Medical History  Diagnosis Date  . Unspecified hearing loss   . Allergic rhinitis, cause unspecified   . Unspecified chronic bronchitis   . Unspecified essential hypertension   . Coronary atherosclerosis of unspecified type of vessel, native or graft   . Unspecified venous (peripheral) insufficiency   . Type II or unspecified type diabetes mellitus without mention of complication, not stated as uncontrolled   . Osteoarthrosis, unspecified whether generalized or localized, unspecified site   . Unspecified vitamin D deficiency   . Anxiety state, unspecified   . Hyperlipidemia   . IBS (irritable bowel syndrome)     Past Surgical History  Procedure Date  . Dilation and curettage of uterus   . Cholecystectomy   . Left elbow surgery   . Cataract surg     Family History  Problem Relation Age of Onset  . Emphysema Mother   . Heart disease Father   . Colon cancer    . Pancreatic cancer      History  Substance Use Topics  . Smoking status: Never Smoker   . Smokeless tobacco: Never Used  . Alcohol Use: No    OB History    Grav Para Term Preterm Abortions TAB SAB Ect Mult Living                  Review of Systems  Constitutional: Negative for  fever.  Gastrointestinal: Negative for vomiting and abdominal pain.  All other systems reviewed and are negative.    Allergies  Penicillins  Home Medications   Current Outpatient Rx  Name Route Sig Dispense Refill  . ACETAMINOPHEN 325 MG PO TABS Oral Take 325 mg by mouth every 6 (six) hours as needed. Pain/headache    . ALPRAZOLAM 0.5 MG PO TABS Oral Take 0.5 mg by mouth 3 (three) times daily as needed. anxiety    . ASPIRIN 81 MG PO TABS Oral Take 81 mg by mouth daily.      Marland Kitchen CETIRIZINE HCL 10 MG PO TABS Oral Take 10 mg by mouth daily.      . COLESEVELAM HCL 625 MG PO TABS Oral Take 625 mg by mouth 2 (two) times daily with breakfast and lunch.    Marland Kitchen DICYCLOMINE HCL 10 MG PO CAPS Oral Take 1 capsule (10 mg total) by mouth 3 (three) times daily before meals. 90 capsule 3  . ERGOCALCIFEROL 50000 UNITS PO CAPS Oral Take 50,000 Units by mouth every Monday.     Marland Kitchen FLUTICASONE PROPIONATE 50 MCG/ACT NA SUSP Nasal Place 2 sprays into the nose daily as needed. allergies    . FUROSEMIDE 40 MG  PO TABS Oral Take 40-80 mg by mouth daily.    Marland Kitchen METFORMIN HCL 500 MG PO TABS Oral Take 500 mg by mouth daily.    Marland Kitchen NIFEDIPINE ER OSMOTIC 60 MG PO TB24 Oral Take 60 mg by mouth daily.    Marland Kitchen SIMVASTATIN 80 MG PO TABS Oral Take 80 mg by mouth at bedtime.      BP 150/75  Pulse 76  Temp 98.2 F (36.8 C) (Oral)  Resp 18  SpO2 98%  Physical Exam  Nursing note and vitals reviewed. Constitutional: She appears well-developed and well-nourished. No distress.  HENT:  Head: Normocephalic and atraumatic.  Right Ear: External ear normal.  Left Ear: External ear normal.  Eyes: Conjunctivae normal are normal. Right eye exhibits no discharge. Left eye exhibits no discharge. No scleral icterus.  Neck: Neck supple. No tracheal deviation present.  Cardiovascular: Normal rate, regular rhythm and intact distal pulses.   Pulmonary/Chest: Effort normal and breath sounds normal. No stridor. No respiratory distress. She has  no wheezes. She has no rales.  Abdominal: Soft. Bowel sounds are normal. She exhibits no distension. There is no tenderness. There is no rebound and no guarding.  Genitourinary: Vagina normal.       External pelvic exam performed, no obvious mass, there is question of a early cystocele but it does not prolapse beyond the introitus  Musculoskeletal: She exhibits no edema and no tenderness.  Neurological: She is alert. She has normal strength. No sensory deficit. Cranial nerve deficit:  no gross defecits noted. She exhibits normal muscle tone. She displays no seizure activity. Coordination normal.  Skin: Skin is warm and dry. No rash noted.  Psychiatric: She has a normal mood and affect.    ED Course  Procedures (including critical care time)  Labs Reviewed  URINALYSIS, ROUTINE W REFLEX MICROSCOPIC - Abnormal; Notable for the following:    APPearance CLOUDY (*)     Nitrite POSITIVE (*)     Leukocytes, UA SMALL (*)     All other components within normal limits  URINE MICROSCOPIC-ADD ON - Abnormal; Notable for the following:    Squamous Epithelial / LPF FEW (*)     Bacteria, UA MANY (*)     All other components within normal limits  URINE CULTURE   No results found.   1. UTI (lower urinary tract infection)   2. Cystocele       MDM  The patient be treated for a possible urinary tract infection.  I prescribed her Septra and urine culture has been sent off. We'll give her the name of a gynecologist to followup with regarding the probable cystocele.        Celene Kras, MD 03/18/12 1946

## 2012-03-18 NOTE — Telephone Encounter (Signed)
Called spoke with patient's sister Hope who stated that pt can tell "something is down there" and "can see it with a mirror."  Denies any bleeding or pain, verified that she has not had a hysterectomy.  Pt is no longer with her GYN Dr Elana Alm, and SN is not in the office today.  Advised pt's sister that pt needs to go to the ER > [facility] the better [facility].  Pt's sister okay with these recs and verbalized her understanding.  Will sign and forward to SN as FYI for when he returns to the office.

## 2012-03-18 NOTE — ED Notes (Signed)
Pt is here because she feels that her "bladder or uterus" is coming out of her vagina.  Pt states that she first noticed this a week ago.  Pt denies any bleeding and is able to void (reports frequent urination)

## 2012-03-20 LAB — URINE CULTURE

## 2012-03-21 ENCOUNTER — Telehealth: Payer: Self-pay | Admitting: Pulmonary Disease

## 2012-03-21 DIAGNOSIS — Z9189 Other specified personal risk factors, not elsewhere classified: Secondary | ICD-10-CM

## 2012-03-21 NOTE — Telephone Encounter (Signed)
Pt is needing a referral to an OBGYN for possible hysterectomy. Pt daughter wants to know who does SN recommend. Please advise.Nichole Silva, CMA

## 2012-03-21 NOTE — Telephone Encounter (Signed)
pts daughter called and scheduled the pt an appt with GYN.  Nothing further is needed

## 2012-03-21 NOTE — ED Notes (Signed)
+   Urine Patient treated with Bactrim-sensitive to same-chart appended per protocol MD. 

## 2012-03-21 NOTE — Telephone Encounter (Signed)
Pt's daughter called back.  She stated she got her mom an appt with an OBGYN and we do not need to refer pt anymore. Nichole Silva

## 2012-03-21 NOTE — Telephone Encounter (Signed)
Please let the pt know that we have put in order for GYN appt.  They will call with this appt.

## 2012-03-22 ENCOUNTER — Ambulatory Visit (INDEPENDENT_AMBULATORY_CARE_PROVIDER_SITE_OTHER): Payer: Medicare Other | Admitting: Gynecology

## 2012-03-22 ENCOUNTER — Encounter: Payer: Self-pay | Admitting: Gynecology

## 2012-03-22 VITALS — BP 130/86 | Ht 64.0 in | Wt 180.0 lb

## 2012-03-22 DIAGNOSIS — N814 Uterovaginal prolapse, unspecified: Secondary | ICD-10-CM

## 2012-03-22 DIAGNOSIS — N8111 Cystocele, midline: Secondary | ICD-10-CM

## 2012-03-22 DIAGNOSIS — N812 Incomplete uterovaginal prolapse: Secondary | ICD-10-CM | POA: Insufficient documentation

## 2012-03-22 DIAGNOSIS — N8110 Cystocele, unspecified: Secondary | ICD-10-CM | POA: Insufficient documentation

## 2012-03-22 MED ORDER — NONFORMULARY OR COMPOUNDED ITEM: Status: DC

## 2012-03-22 NOTE — Progress Notes (Signed)
Patient is a 77 year old who presented to the office today with her daughter as a result of this bulging sensation and she's felt in her vagina after her recent urinary tract infection when she visited the emergency room. She states that she is continent but if her bladder is full she has to rush to the bathroom because of frequency she may leak a little bit. Patient's had 4 previous normal spontaneous vaginal deliveries. Patient with history of coronary artery disease, hyperlipidemia, and hypertension for which she's been followed by Dr. Bobbe Medico. Patient stated her last gynecological examination was several years ago and she has always had normal Pap smears.  Exam: Patient was examined in the supine and erect position. Bartholin urethra Skene: Atrophic changes Vagina: First to second-degree cystocele with first-degree uterine prolapse, no rectocele Bimanual: Axial small normal size shape and consistency Adnexa: No palpable masses or tenderness Rectal exam deferred  Q-tip angle test during Valsalva less than 30 urethrovesical angle change. On correcting the cystocele there was no incontinence. In the right position minimal uterine descensus mostly first to second-degree cystocele. Patient mobile solid and coughing did not leak urine. Slight vaginal atrophy was noted.  We discussed treatment options to include surgical transvaginal hysterectomy with bilateral salpingo-oophorectomy with anterior colporrhaphy versus trial of vaginal pessary less invasive procedure due to her fragile health. We tried different pessaries and she was fitted for a doughnut 2-1/2 inch which worked well for her. She will be prescribed Estrace vaginal cream to apply twice a week. We will have her return to the office in one month for followup and and perhaps every 3 months to clean and reinsert the pessary and see how she does.

## 2012-03-22 NOTE — Patient Instructions (Signed)
Prolapse  Prolapse means the falling down, bulging, dropping, or drooping of a body part. Organs that commonly prolapse include the rectum, small intestine, bladder, urethra, vagina (birth canal), uterus (womb), and cervix. Prolapse occurs when the ligaments and muscle tissue around the rectum, bladder, and uterus are damaged or weakened.  CAUSES  This happens especially with:  Childbirth. Some women feel pelvic pressure or have trouble holding their urine right after childbirth, because of stretching and tearing of pelvic tissues. This generally gets better with time and the feeling usually goes away, but it may return with aging.  Chronic heavy lifting.  Aging.  Menopause, with loss of estrogen production weakening the pelvic ligaments and muscles.  Past pelvic surgery.  Obesity.  Chronic constipation.  Chronic cough. Prolapse may affect a single organ, or several organs may prolapse at the same time. The front wall of the vagina holds up the bladder. The back wall holds up part of the lower intestine, or rectum. The uterus fills a spot in the middle. All these organs can be involved when the ligaments and muscles around the vagina relax too much. This often gets worse when women stop producing estrogen (menopause). SYMPTOMS  Uncontrolled loss of urine (incontinence) with cough, sneeze, straining, and exercise.  More force may be required to have a bowel movement, due to trapping of the stool.  When part of an organ bulges through the opening of the vagina, there is sometimes a feeling of heaviness or pressure. It may feel as though something is falling out. This sensation increases with coughing or bearing down.  If the organs protrude through the opening of the vagina and rub against the clothing, there may be soreness, ulcers, infection, pain, and bleeding.  Lower back pain.  Pushing in the upper or lower part of the vagina, to pass urine or have a bowel movement.  Problems  having sexual intercourse.  Being unable to insert a tampon or applicator. DIAGNOSIS  Usually, a physical exam is all that is needed to identify the problem. During the examination, you may be asked to cough and strain while lying down, sitting up, and standing up. Your caregiver will determine if more testing is required, such as bladder function tests. Some diagnoses are:  Cystocele: Bulging and falling of the bladder into the top of the vagina.  Rectocele: Part of the rectum bulging into the vagina.  Prolapse of the uterus: The uterus falls or drops into the vagina.  Enterocele: Bulging of the top of the vagina, after a hysterectomy (uterus removal), with the small intestine bulging into the vagina. A hernia in the top of the vagina.  Urethrocele: The urethra (urine carrying tube) bulging into the vagina. TREATMENT  In most cases, prolapse needs to be treated only if it produces symptoms. If the symptoms are interfering with your usual daily or sexual activities, treatment may be necessary. The following are some measures that may be used to treat prolapse.  Estrogen may help elderly women with mild prolapse.  Kegel exercises may help mild cases of prolapse, by strengthening and tightening the muscles of the pelvic floor.  Pessaries are used in women who choose not to, or are unable to, have surgery. A pessary is a doughnut-shaped piece of plastic or rubber that is put into the vagina to keep the organs in place. This device must be fitted by your caregiver. Your caregiver will also explain how to care for yourself with the pessary. If it works well for you,   to keep the organs in place. This device must be fitted by your caregiver. Your caregiver will also explain how to care for yourself with the pessary. If it works well for you, this may be the only treatment required.   Surgery is often the only form of treatment for more severe prolapses. There are different types of surgery available. You should discuss what the best procedure is for you. If the uterus is prolapsed, it may be removed (hysterectomy) as part of the surgical treatment. Your caregiver will  discuss the risks and benefits with you.   Uterine-vaginal suspension (surgery to hold up the organs) may be used, especially if you want to maintain your fertility.  No form of treatment is guaranteed to correct the prolapse or relieve the symptoms.  HOME CARE INSTRUCTIONS    Wear a sanitary pad or absorbent product if you have incontinence of urine.   Avoid heavy lifting and straining with exercise and work.   Take over-the-counter pain medicine for minor discomfort.   Try taking estrogen or using estrogen vaginal cream.   Try Kegel exercises or use a pessary, before deciding to have surgery.   Do Kegel exercises after having a baby.  SEEK MEDICAL CARE IF:    Your symptoms interfere with your daily activities.   You need medicine to help with the discomfort.   You need to be fitted with a pessary.   You notice bleeding from the vagina.   You think you have ulcers or you notice ulcers on the cervix.   You have an oral temperature above 102 F (38.9 C).   You develop pain or blood with urination.   You have bleeding with a bowel movement.   The symptoms are interfering with your sex life.   You have urinary incontinence that interferes with your daily activities.   You lose urine with sexual intercourse.   You have a chronic cough.   You have chronic constipation.  Document Released: 11/15/2002 Document Revised: 08/03/2011 Document Reviewed: 05/26/2009  ExitCare Patient Information 2013 [employer].

## 2012-03-24 ENCOUNTER — Other Ambulatory Visit: Payer: Self-pay | Admitting: Pulmonary Disease

## 2012-04-12 ENCOUNTER — Other Ambulatory Visit: Payer: Self-pay | Admitting: Pulmonary Disease

## 2012-04-19 ENCOUNTER — Encounter: Payer: Medicare Other | Admitting: Internal Medicine

## 2012-04-26 ENCOUNTER — Encounter: Payer: Self-pay | Admitting: Gynecology

## 2012-04-26 ENCOUNTER — Ambulatory Visit (INDEPENDENT_AMBULATORY_CARE_PROVIDER_SITE_OTHER): Payer: Medicare Other | Admitting: Gynecology

## 2012-04-26 VITALS — BP 134/88

## 2012-04-26 DIAGNOSIS — Z4689 Encounter for fitting and adjustment of other specified devices: Secondary | ICD-10-CM

## 2012-04-26 NOTE — Patient Instructions (Addendum)
Hysterectomy Information   A hysterectomy is a procedure where your uterus is surgically removed. It will no longer be possible to have menstrual periods or to become pregnant. The tubes and ovaries can be removed (bilateral salpingo-oopherectomy) during this surgery as well.    REASONS FOR A HYSTERECTOMY  · Persistent, abnormal bleeding.  · Lasting (chronic) pelvic pain or infection.  · The lining of the uterus (endometrium) starts growing outside the uterus (endometriosis).  · The endometrium starts growing in the muscle of the uterus (adenomyosis).  · The uterus falls down into the vagina (pelvic organ prolapse).  · Symptomatic uterine fibroids.  · Precancerous cells.  · Cervical cancer or uterine cancer.  TYPES OF HYSTERECTOMIES  · Supracervical hysterectomy. This type removes the top part of the uterus, but not the cervix.  · Total hysterectomy. This type removes the uterus and cervix.  · Radical hysterectomy. This type removes the uterus, cervix, and the fibrous tissue that holds the uterus in place in the pelvis (parametrium).  WAYS A HYSTERECTOMY CAN BE PERFORMED  · Abdominal hysterectomy. A large surgical cut (incision) is made in the abdomen. The uterus is removed through this incision.  · Vaginal hysterectomy. An incision is made in the vagina. The uterus is removed through this incision. There are no abdominal incisions.  · Conventional laparoscopic hysterectomy. A thin, lighted tube with a camera (laparoscope) is inserted into 3 or 4 small incisions in the abdomen. The uterus is cut into small pieces. The small pieces are removed through the incisions, or they are removed through the vagina.  · Laparoscopic assisted vaginal hysterectomy (LAVH). Three or four small incisions are made in the abdomen. Part of the surgery is performed laparoscopically and part vaginally. The uterus is removed through the vagina.  · Robot-assisted laparoscopic hysterectomy. A laparoscope is inserted into 3 or 4 small  incisions in the abdomen. A computer-controlled device is used to give the surgeon a 3D image. This allows for more precise movements of surgical instruments. The uterus is cut into small pieces and removed through the incisions or removed through the vagina.  RISKS OF HYSTERECTOMY    · Bleeding and risk of blood transfusion. Tell your caregiver if you do not want to receive any blood products.  · Blood clots in the legs or lung.  · Infection.  · Injury to surrounding organs.  · Anesthesia problems or side effects.  · Conversion to an abdominal hysterectomy.  WHAT TO EXPECT AFTER A HYSTERECTOMY  · You will be given pain medicine.  · You will need to have someone with you for the first 3 to 5 days after you go home.  · You will need to follow up with your surgeon in 2 to 4 weeks after surgery to evaluate your progress.  · You may have early menopause symptoms like hot flashes, night sweats, and insomnia.  · If you had a hysterectomy for a problem that was not a cancer or a condition that could lead to cancer, then you no longer need Pap tests. However, even if you no longer need a Pap test, a regular exam is a good idea to make sure no other problems are starting.  Document Released: 11/04/2000 Document Revised: 08/03/2011 Document Reviewed: 12/20/2010  ExitCare® Patient Information ©2013 [employer].

## 2012-04-26 NOTE — Progress Notes (Signed)
Patient is 77 year old who was seen the office October 29 as a result of a bulging sensation that she felt in her vagina. Patient denied any stress urinary incontinence. She had 4 normal spontaneous vaginal deliveries in the past. Patient has a history of coronary artery disease, hyperlipidemia, and hypertension whereby she has been followed by Dr. Bobbe Medico. Her examination during that office visit had demonstrated a second-degree cystocele with first degree uterine prolapse no rectocele. Patient had a normal Q-tip angle tests of less than 30 on Valsalva (normal urethrovesical angle).On correcting the cystocele there was no incontinence. In the erect position there was minimal uterine descensus and second-degree cystocele was noted. On coughing the patient did not leak any urine. And some vaginal atrophy had been noted.   We discussed treatment options to include surgical transvaginal hysterectomy with bilateral salpingo-oophorectomy with anterior colporrhaphy versus trial of vaginal pessary less invasive procedure due to her fragile health. We tried different pessaries and she was fitted for a doughnut 2-1/2 inch which worked well for her. She will be prescribed Estrace vaginal cream to apply twice a week.  She returns today for followup and for cleaning of her pessary. She stated the first 2 weeks she felt like there was something vagina but the past 2 weeks she has felt fine with no bulging sensation and no stress urinary incontinence reported and her bowel movements are reported to be normal as well.  She was concerned whether this may have long-term affects or whether she may need surgery in the future and I reassured her that she can come to the office every 2 months we can clean her pessary and she would do just fine. We also discussed importance of her applying Estrace vaginal cream twice a week for vaginal atrophy and this is help tremendously as well.  On exam after the pessary was removed the  vaginal epithelium was intact and no evidence of erosion in her cervix with no lesions. The pessary was then replaced after it had been cleaned.  I have asked her to see her primary physician sometime next month for possible medical clearance in the event that she wants to proceed the surgical route. We need to rule out the benefits versus the risk of surgery and we will wait to hear from Dr. Bobbe Medico. She will return back to the office first week of February and we'll see how she's coming along and determine if we continue the present route and she's doing well versus scheduling surgery pending medical clearance. Literature information was provided.

## 2012-04-28 ENCOUNTER — Telehealth: Payer: Self-pay | Admitting: Pulmonary Disease

## 2012-04-28 NOTE — Telephone Encounter (Signed)
Nichole Silva returned call.  [name]

## 2012-04-28 NOTE — Telephone Encounter (Signed)
[email or web address]  

## 2012-04-28 NOTE — Telephone Encounter (Signed)
I spoke with daughter and she stated pt is due for mammogram. She is scheduled to see SN 06/10/11 for surgery clearance for hysterectomy. They just need to know if they need referral for this and who they need to contact. Please advise if okay to set this up thanks

## 2012-04-29 ENCOUNTER — Telehealth: Payer: Self-pay | Admitting: Pulmonary Disease

## 2012-04-29 NOTE — Telephone Encounter (Signed)
Called and spoke with pts daughter and she stated that neither place had a record of the pt being there before.  She will try to review old insurance forms to see when the last mammogram was.  Nothing further is needed.

## 2012-04-29 NOTE — Telephone Encounter (Signed)
Called and spoke with Nichole Silva and she is aware the per SN---they can call and schedule the mammogram without a referral from SN.  i explained that if they do need anything they will send over paper work.  Nothing further is needed.

## 2012-05-02 ENCOUNTER — Telehealth: Payer: Self-pay | Admitting: Pulmonary Disease

## 2012-05-02 DIAGNOSIS — R928 Other abnormal and inconclusive findings on diagnostic imaging of breast: Secondary | ICD-10-CM

## 2012-05-02 NOTE — Telephone Encounter (Signed)
Spoke with patients daughter, daughter states that she has tried two different places to get patients mammogram done but both places have told her that they can not do a mammogram unless they have something to compare the new one to; daughter states she has called everywhere she can think of for these records and they can not be found.  Daughter would like advice on how/where to proceed from here.  Dr. Kriste Basque please advise thank you.

## 2012-05-04 ENCOUNTER — Telehealth: Payer: Self-pay | Admitting: Pulmonary Disease

## 2012-05-04 NOTE — Telephone Encounter (Signed)
I spoke with Nichole Silva and she just wanted to verify order regarding the mammogram for pt. It stated abnormal mammogram in the past. wanted to know if we had the films. I advised her we did not. She voiced her understanding and needed nothing further.

## 2012-05-04 NOTE — Telephone Encounter (Signed)
Per Libby---ok to place the order and she will work on getting pt scheduled for mammogram.

## 2012-05-05 ENCOUNTER — Telehealth: Payer: Self-pay | Admitting: Pulmonary Disease

## 2012-05-05 ENCOUNTER — Other Ambulatory Visit: Payer: Self-pay | Admitting: Pulmonary Disease

## 2012-05-05 DIAGNOSIS — Z1231 Encounter for screening mammogram for malignant neoplasm of breast: Secondary | ICD-10-CM

## 2012-05-05 NOTE — Telephone Encounter (Signed)
I spoke with Nichole Silva and she states she was told to always order a diagnostic mammogram no matter what. She states this is the pt yearly mammogram. [name] advised and will make note. Carron Curie, CMA

## 2012-05-23 ENCOUNTER — Ambulatory Visit (AMBULATORY_SURGERY_CENTER): Payer: Medicare Other | Admitting: *Deleted

## 2012-05-23 VITALS — Ht 65.5 in | Wt 180.0 lb

## 2012-05-23 DIAGNOSIS — Z1211 Encounter for screening for malignant neoplasm of colon: Secondary | ICD-10-CM

## 2012-05-23 MED ORDER — MOVIPREP 100 G PO SOLR: ORAL | Status: DC

## 2012-05-26 ENCOUNTER — Other Ambulatory Visit: Payer: Self-pay | Admitting: Pulmonary Disease

## 2012-06-01 ENCOUNTER — Encounter: Payer: Self-pay | Admitting: Internal Medicine

## 2012-06-01 ENCOUNTER — Ambulatory Visit (AMBULATORY_SURGERY_CENTER): Payer: Medicare Other | Admitting: Internal Medicine

## 2012-06-01 VITALS — BP 137/71 | HR 67 | Temp 92.0°F | Resp 17 | Ht 65.0 in | Wt 180.0 lb

## 2012-06-01 DIAGNOSIS — K5730 Diverticulosis of large intestine without perforation or abscess without bleeding: Secondary | ICD-10-CM

## 2012-06-01 DIAGNOSIS — Z1211 Encounter for screening for malignant neoplasm of colon: Secondary | ICD-10-CM

## 2012-06-01 LAB — GLUCOSE, CAPILLARY
Glucose-Capillary: 92 mg/dL (ref 70–99)
Glucose-Capillary: 94 mg/dL (ref 70–99)

## 2012-06-01 MED ORDER — SODIUM CHLORIDE 0.9 % IV SOLN: 500.0000 mL | INTRAVENOUS | Status: DC

## 2012-06-01 NOTE — Patient Instructions (Addendum)
YOU HAD AN ENDOSCOPIC PROCEDURE TODAY AT THE [facility]: Refer to the procedure report that was given to you for any specific questions about what was found during the examination.  If the procedure report does not answer your questions, please call your gastroenterologist to clarify.  If you requested that your care partner not be given the details of your procedure findings, then the procedure report has been included in a sealed envelope for you to review at your convenience later.  YOU SHOULD EXPECT: Some feelings of bloating in the abdomen. Passage of more gas than usual.  Walking can help get rid of the air that was put into your GI tract during the procedure and reduce the bloating. If you had a lower endoscopy (such as a colonoscopy or flexible sigmoidoscopy) you may notice spotting of blood in your stool or on the toilet paper. If you underwent a bowel prep for your procedure, then you may not have a normal bowel movement for a few days.  DIET: Your first meal following the procedure should be a light meal and then it is ok to progress to your normal diet.  A half-sandwich or bowl of soup is an example of a good first meal.  Heavy or fried foods are harder to digest and may make you feel nauseous or bloated.  Likewise meals heavy in dairy and vegetables can cause extra gas to form and this can also increase the bloating.  Drink plenty of fluids but you should avoid alcoholic beverages for 24 hours.  ACTIVITY: Your care partner should take you home directly after the procedure.  You should plan to take it easy, moving slowly for the rest of the day.  You can resume normal activity the day after the procedure however you should NOT DRIVE or use heavy machinery for 24 hours (because of the sedation medicines used during the test).    SYMPTOMS TO REPORT IMMEDIATELY: A gastroenterologist can be reached at any hour.  During normal business hours, 8:30 AM to 5:00 PM Monday through Friday,  call [phone].  After hours and on weekends, please call the GI answering service at [phone] who will take a message and have the physician on call contact you.   Following lower endoscopy (colonoscopy or flexible sigmoidoscopy):  Excessive amounts of blood in the stool  Significant tenderness or worsening of abdominal pains  Swelling of the abdomen that is new, acute  Fever of 100F or higher  FOLLOW UP:  Our staff will call the home number listed on your records the next business day following your procedure to check on you and address any questions or concerns that you may have at that time regarding the information given to you following your procedure. This is a courtesy call and so if there is no answer at the home number and we have not heard from you through the emergency physician on call, we will assume that you have returned to your regular daily activities without incident.  SIGNATURES/CONFIDENTIALITY: You and/or your care partner have signed paperwork which will be entered into your electronic medical record.  These signatures attest to the fact that that the information above on your After Visit Summary has been reviewed and is understood.  Full responsibility of the confidentiality of this discharge information lies with you and/or your care-partner.   Continue your normal medications  Please read information about high fiber diets and diverticulosis  Follow up colonoscopy in 10 years

## 2012-06-01 NOTE — Progress Notes (Signed)
Patient did not have preoperative order for IV antibiotic SSI prophylaxis. (G8918)  Patient did not experience any of the following events: a burn prior to discharge; a fall within the facility; wrong site/side/patient/procedure/implant event; or a hospital transfer or hospital admission upon discharge from the facility. (G8907)  

## 2012-06-01 NOTE — Op Note (Signed)
[facility] [address] [place], [address]   COLONOSCOPY PROCEDURE REPORT  PATIENT: Nichole, Silva  MR#: [id number] BIRTHDATE: 08-29-1934 , 77  yrs. old GENDER: Female ENDOSCOPIST: Hart Carwin, MD REFERRED BY:  Alroy Dust, M.D. PROCEDURE DATE:  06/01/2012 PROCEDURE:   Colonoscopy, screening ASA CLASS:   Class II INDICATIONS:last colon 2002- diverticulosis. MEDICATIONS: MAC sedation, administered by CRNA and propofol (Diprivan) 250mg  IV  DESCRIPTION OF PROCEDURE:   After the risks and benefits and of the procedure were explained, informed consent was obtained.  A digital rectal exam revealed no abnormalities of the rectum.    The LB [id number]  endoscope was introduced through the anus and advanced to the cecum, which was identified by both the appendix and ileocecal valve .  The quality of the prep was good, using MoviPrep .  The instrument was then slowly withdrawn as the colon was fully examined.     COLON FINDINGS: There was moderate diverticulosis noted in the sigmoid colon with associated tortuosity and muscular hypertrophy. Retroflexed views revealed no abnormalities.     The scope was then withdrawn from the patient and the procedure completed.  COMPLICATIONS: There were no complications. ENDOSCOPIC IMPRESSION: There was moderate diverticulosis noted in the sigmoid colon  RECOMMENDATIONS: High fiber diet   REPEAT EXAM: In 10 year(s)  for Colonoscopy.  cc:  _______________________________ eSignedHart Carwin, MD 06/01/2012 11:42 AM

## 2012-06-02 ENCOUNTER — Telehealth: Payer: Self-pay | Admitting: *Deleted

## 2012-06-02 NOTE — Telephone Encounter (Signed)
No answer left message to call if questions or concerns. 

## 2012-06-07 ENCOUNTER — Ambulatory Visit
Admission: RE | Admit: 2012-06-07 | Discharge: 2012-06-07 | Disposition: A | Discharge status: Home / Self Care | Payer: Medicare Other | Source: Ambulatory Visit | Attending: Pulmonary Disease | Admitting: Pulmonary Disease

## 2012-06-07 DIAGNOSIS — Z1231 Encounter for screening mammogram for malignant neoplasm of breast: Secondary | ICD-10-CM

## 2012-06-08 IMAGING — MG MM DIGITAL SCREENING BILAT
4 series · 4 of 4 positions shown · non-contrast
Comparison: None.

CLINICAL DATA: Screening.

DIGITAL BILATERAL SCREENING MAMMOGRAM WITH CAD

[R CC]
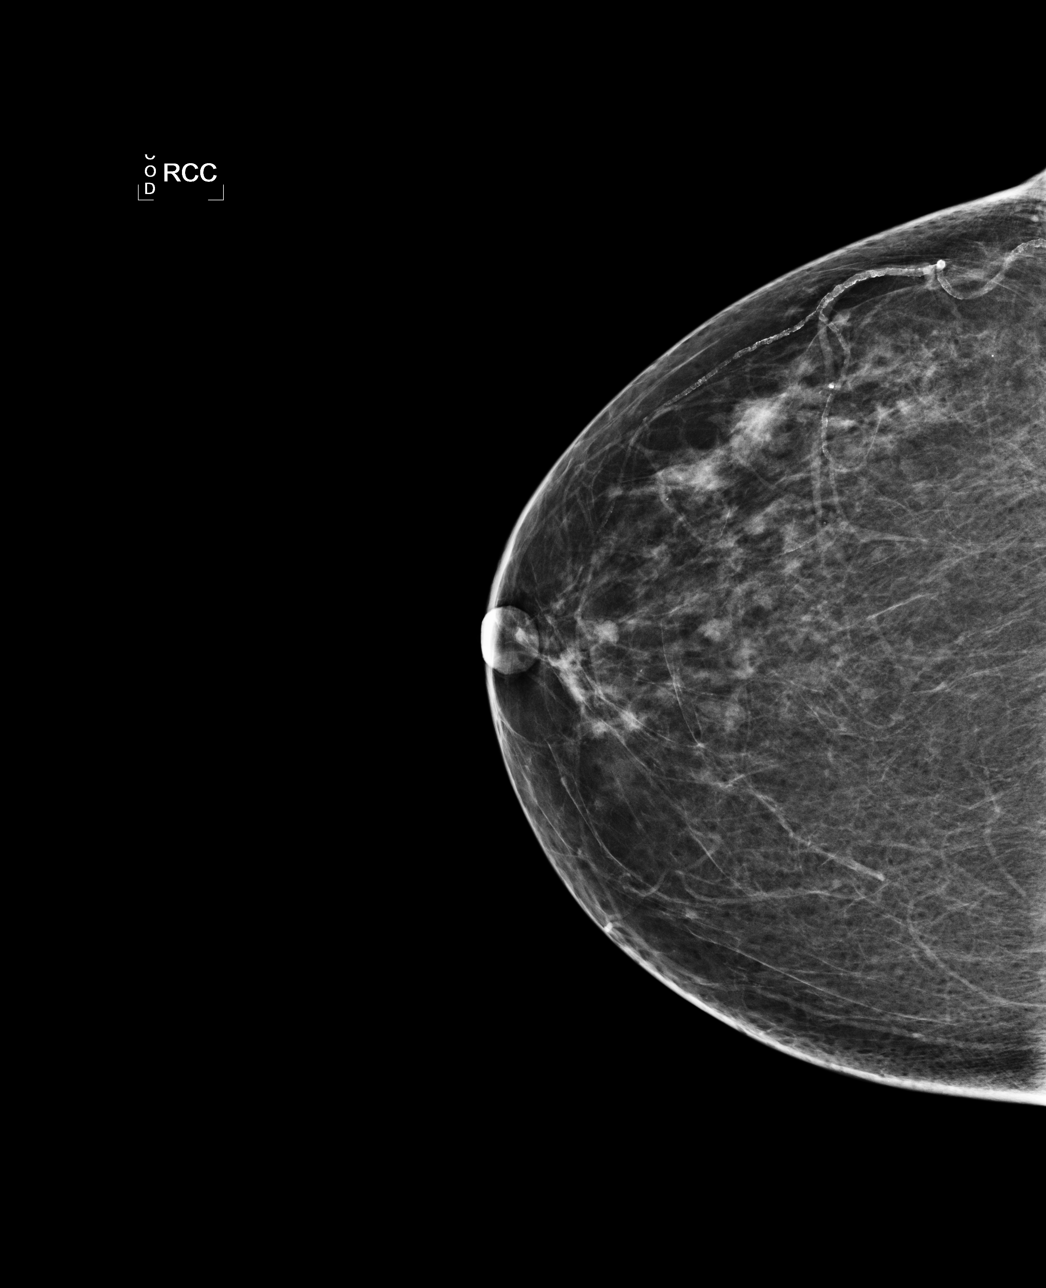

[L CC]
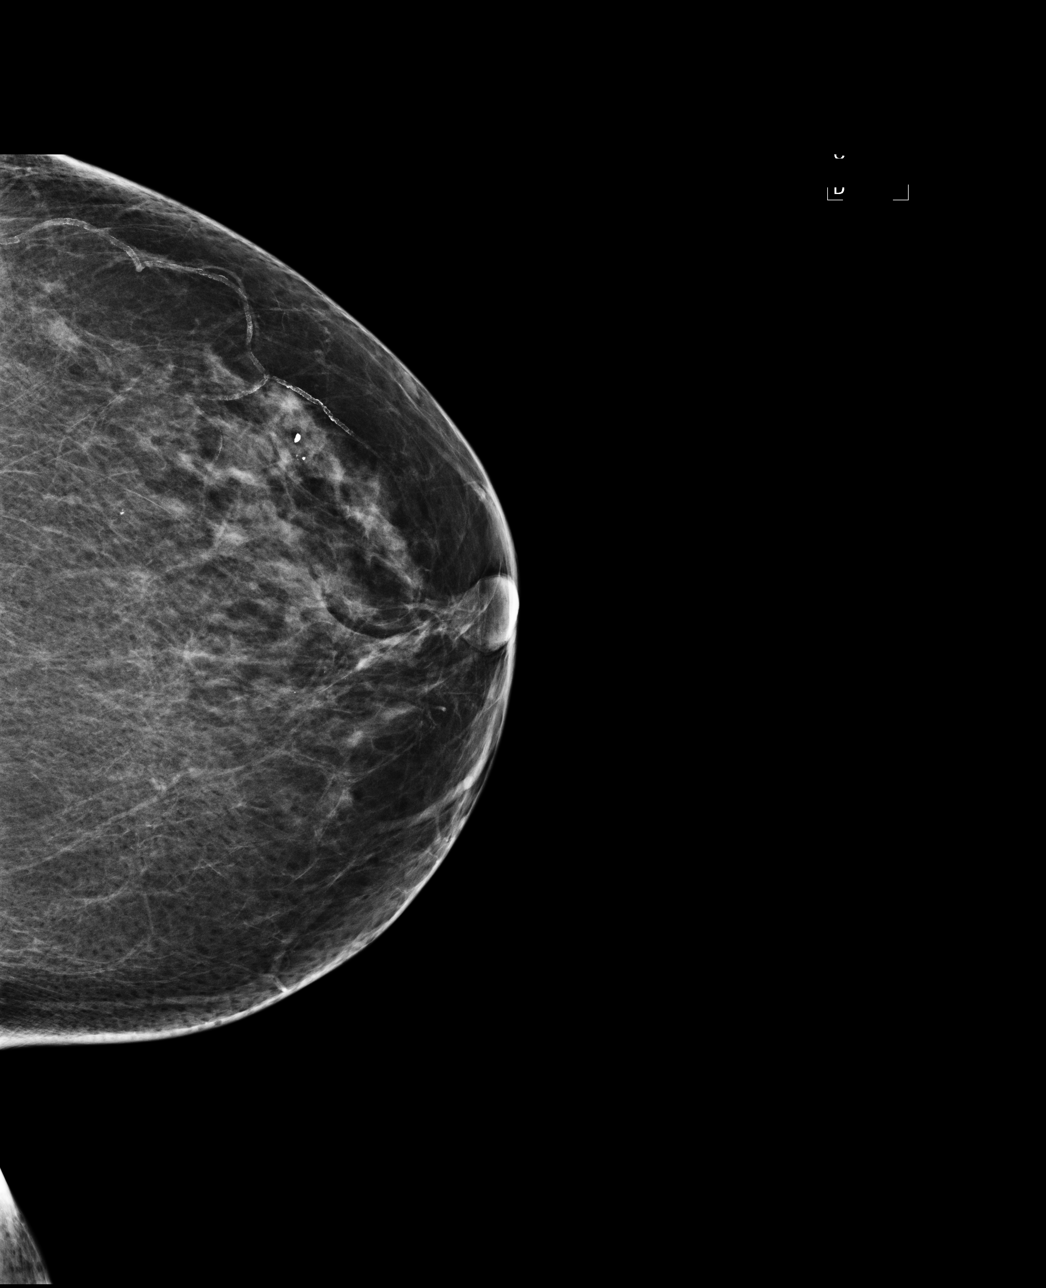

[L MLO]
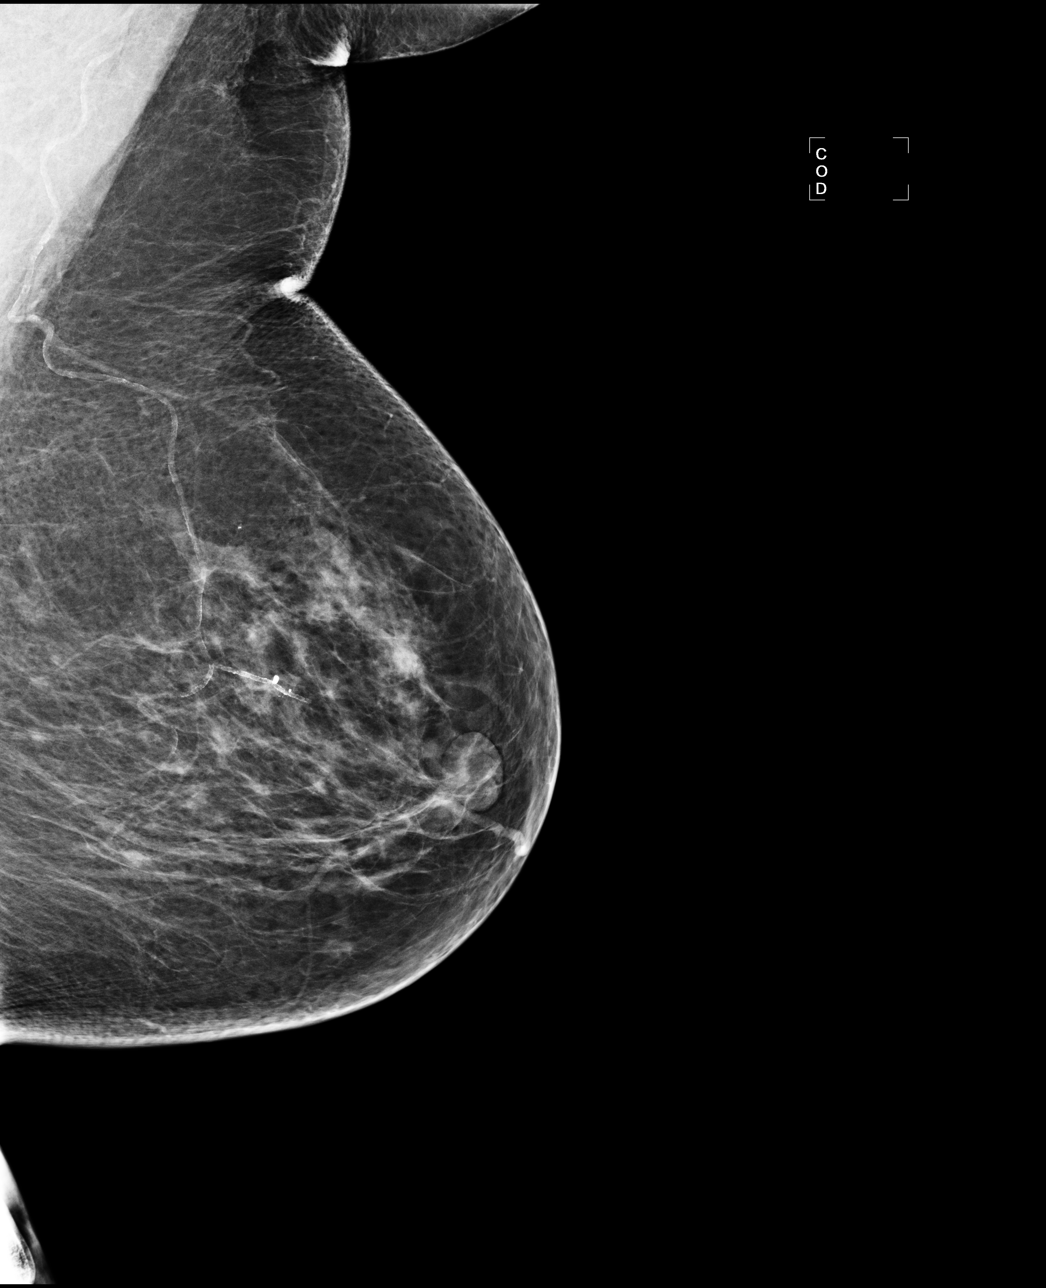

[R MLO]
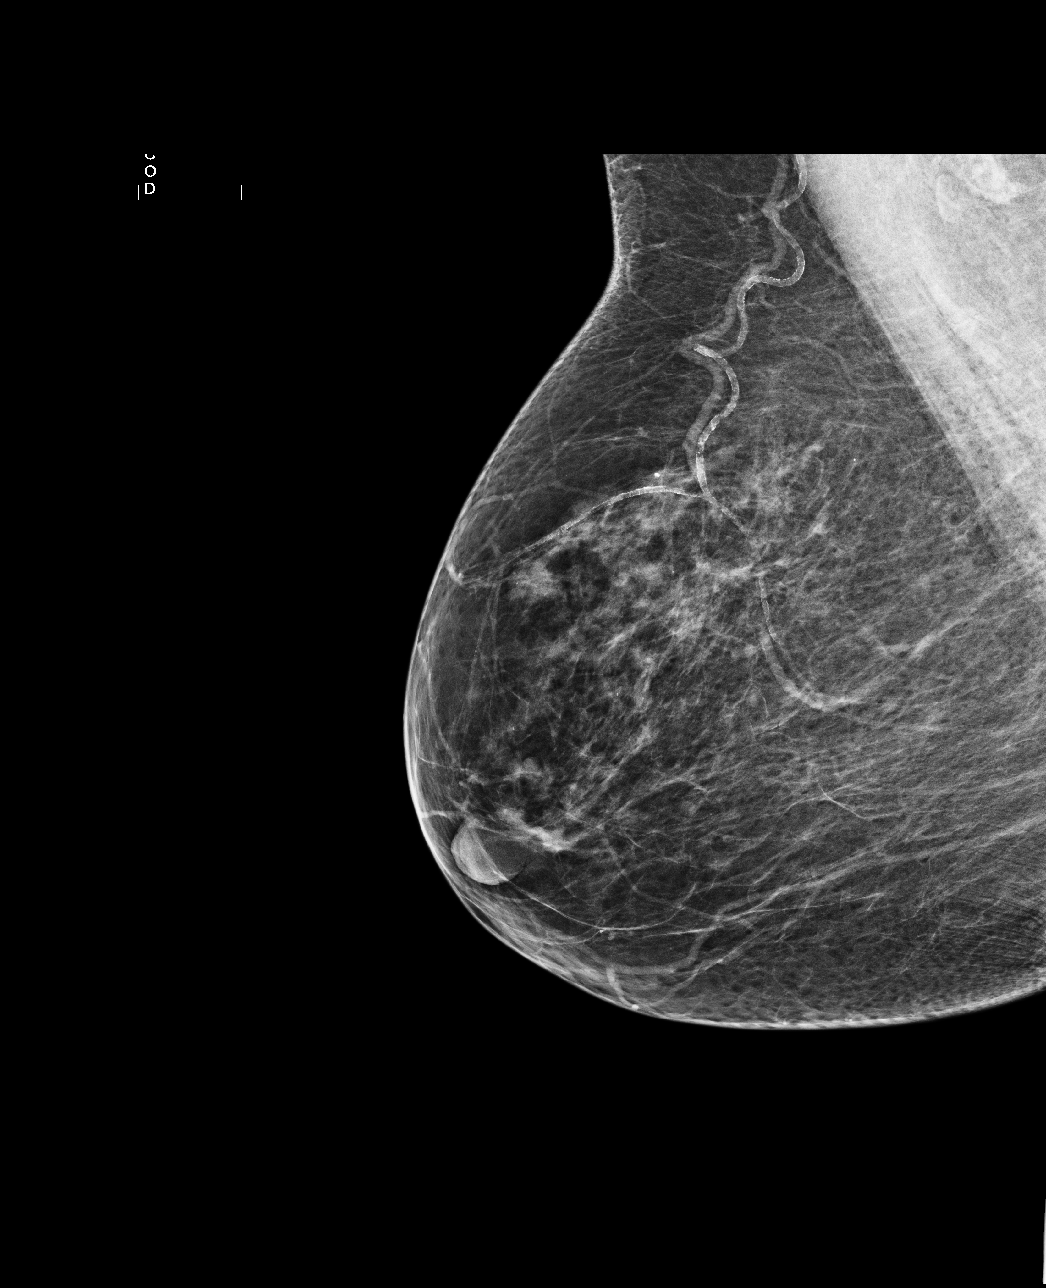

[4 of 4 positions shown; findings below may reference images not displayed]

FINDINGS: ACR Breast Density Category 2: There is a scattered fibroglandular
pattern.

In the right breast, a possible mass warrants further evaluation
with spot compression views and possibly ultrasound.  In the left
breast, no masses or malignant type calcifications are identified.

Images were processed with CAD.
IMPRESSION: Further evaluation is suggested for possible mass in the right
breast.

RECOMMENDATION:
Diagnostic mammogram and possibly ultrasound of the right breast.
(Code:[id number])

The patient will be contacted regarding the findings, and
additional imaging will be scheduled.

BI-RADS CATEGORY 0:  Incomplete.  Need additional imaging
evaluation and/or prior mammograms for comparison.

## 2012-06-09 ENCOUNTER — Ambulatory Visit (INDEPENDENT_AMBULATORY_CARE_PROVIDER_SITE_OTHER)
Admission: RE | Admit: 2012-06-09 | Discharge: 2012-06-09 | Disposition: A | Discharge status: Home / Self Care | Payer: Medicare Other | Source: Ambulatory Visit | Attending: Pulmonary Disease | Admitting: Pulmonary Disease

## 2012-06-09 ENCOUNTER — Ambulatory Visit (INDEPENDENT_AMBULATORY_CARE_PROVIDER_SITE_OTHER): Payer: Medicare Other | Admitting: Pulmonary Disease

## 2012-06-09 ENCOUNTER — Encounter: Payer: Self-pay | Admitting: Pulmonary Disease

## 2012-06-09 VITALS — BP 142/64 | HR 77 | Temp 97.7°F | Ht 65.0 in | Wt 183.0 lb

## 2012-06-09 DIAGNOSIS — I872 Venous insufficiency (chronic) (peripheral): Secondary | ICD-10-CM

## 2012-06-09 DIAGNOSIS — K589 Irritable bowel syndrome without diarrhea: Secondary | ICD-10-CM

## 2012-06-09 DIAGNOSIS — I10 Essential (primary) hypertension: Secondary | ICD-10-CM

## 2012-06-09 DIAGNOSIS — I2510 Atherosclerotic heart disease of native coronary artery without angina pectoris: Secondary | ICD-10-CM

## 2012-06-09 DIAGNOSIS — N812 Incomplete uterovaginal prolapse: Secondary | ICD-10-CM

## 2012-06-09 DIAGNOSIS — M1990 Unspecified osteoarthritis, unspecified site: Secondary | ICD-10-CM

## 2012-06-09 DIAGNOSIS — F411 Generalized anxiety disorder: Secondary | ICD-10-CM

## 2012-06-09 DIAGNOSIS — E785 Hyperlipidemia, unspecified: Secondary | ICD-10-CM

## 2012-06-09 DIAGNOSIS — J42 Unspecified chronic bronchitis: Secondary | ICD-10-CM

## 2012-06-09 DIAGNOSIS — E559 Vitamin D deficiency, unspecified: Secondary | ICD-10-CM

## 2012-06-09 DIAGNOSIS — J309 Allergic rhinitis, unspecified: Secondary | ICD-10-CM

## 2012-06-09 DIAGNOSIS — N8110 Cystocele, unspecified: Secondary | ICD-10-CM

## 2012-06-09 DIAGNOSIS — E119 Type 2 diabetes mellitus without complications: Secondary | ICD-10-CM

## 2012-06-09 IMAGING — CR DG CHEST 2V
2 series · 2 of 2 positions shown · non-contrast
Comparison: 07/13/2011

CLINICAL DATA: Preop

CHEST - 2 VIEW

[view not recorded (1 of 2)]
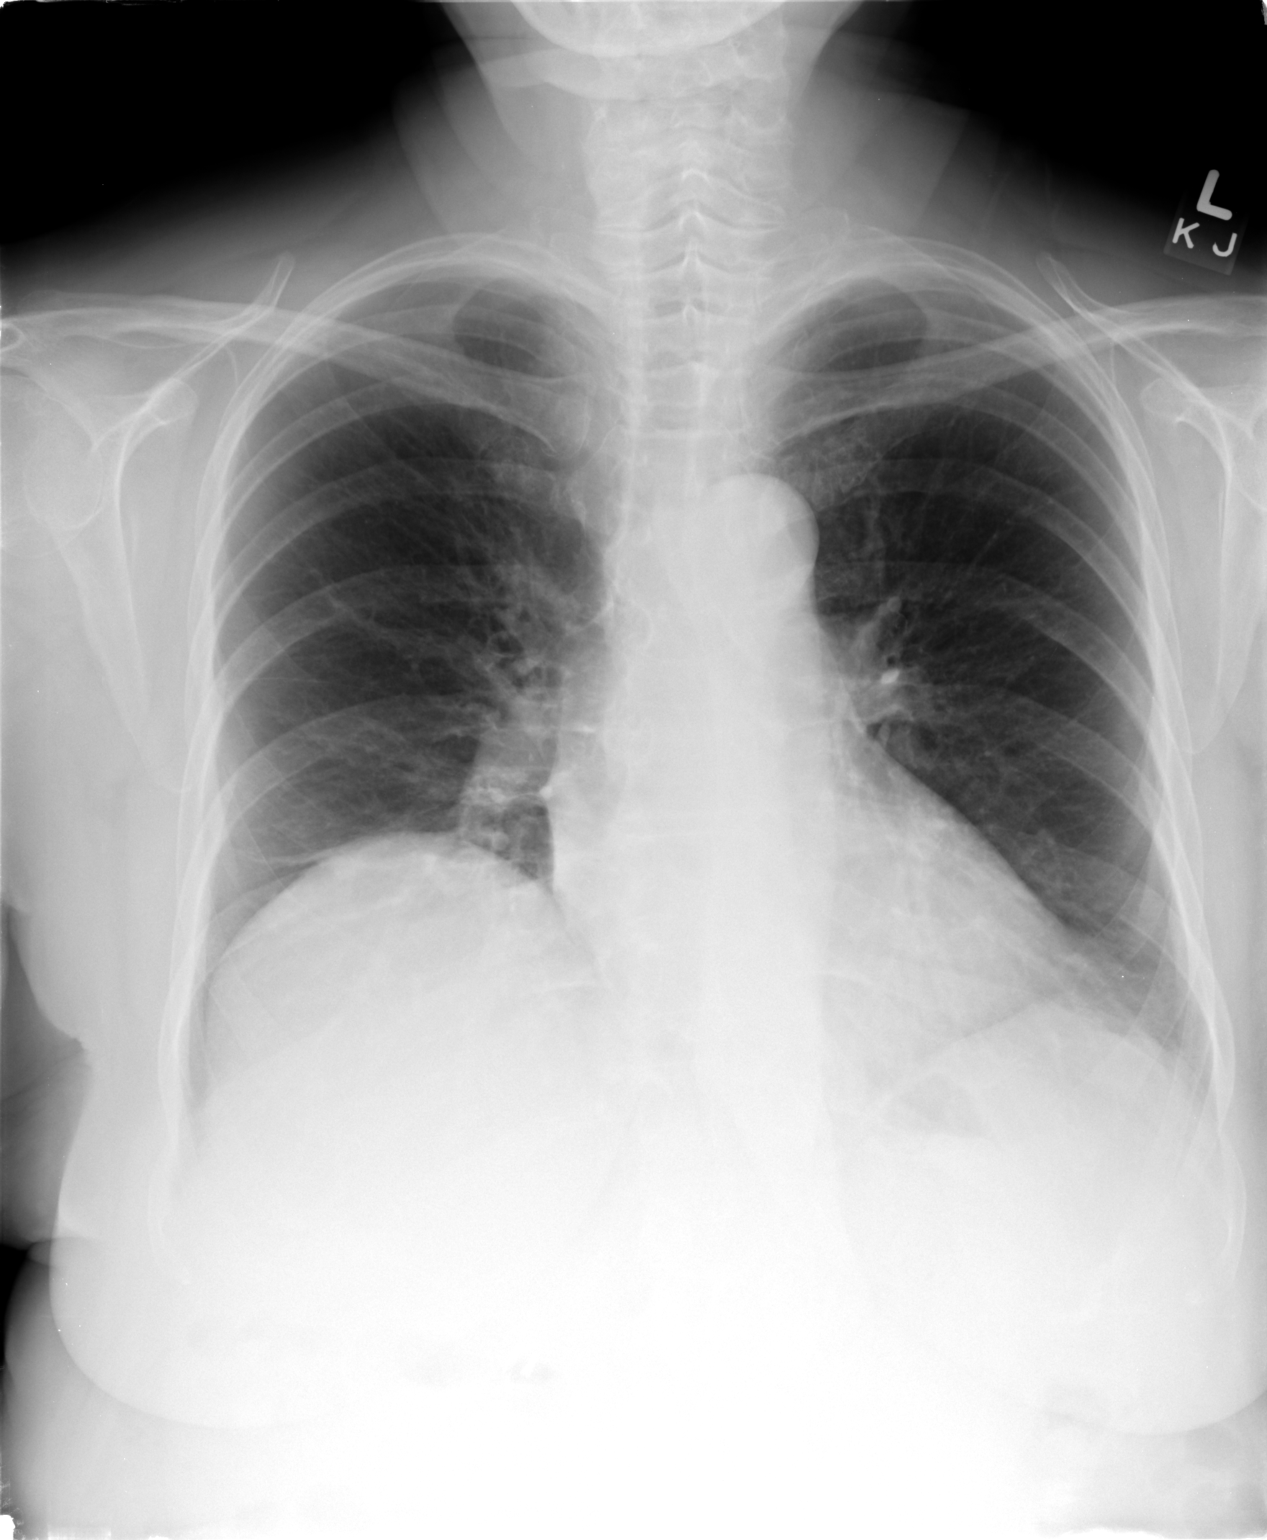

[view not recorded (2 of 2)]
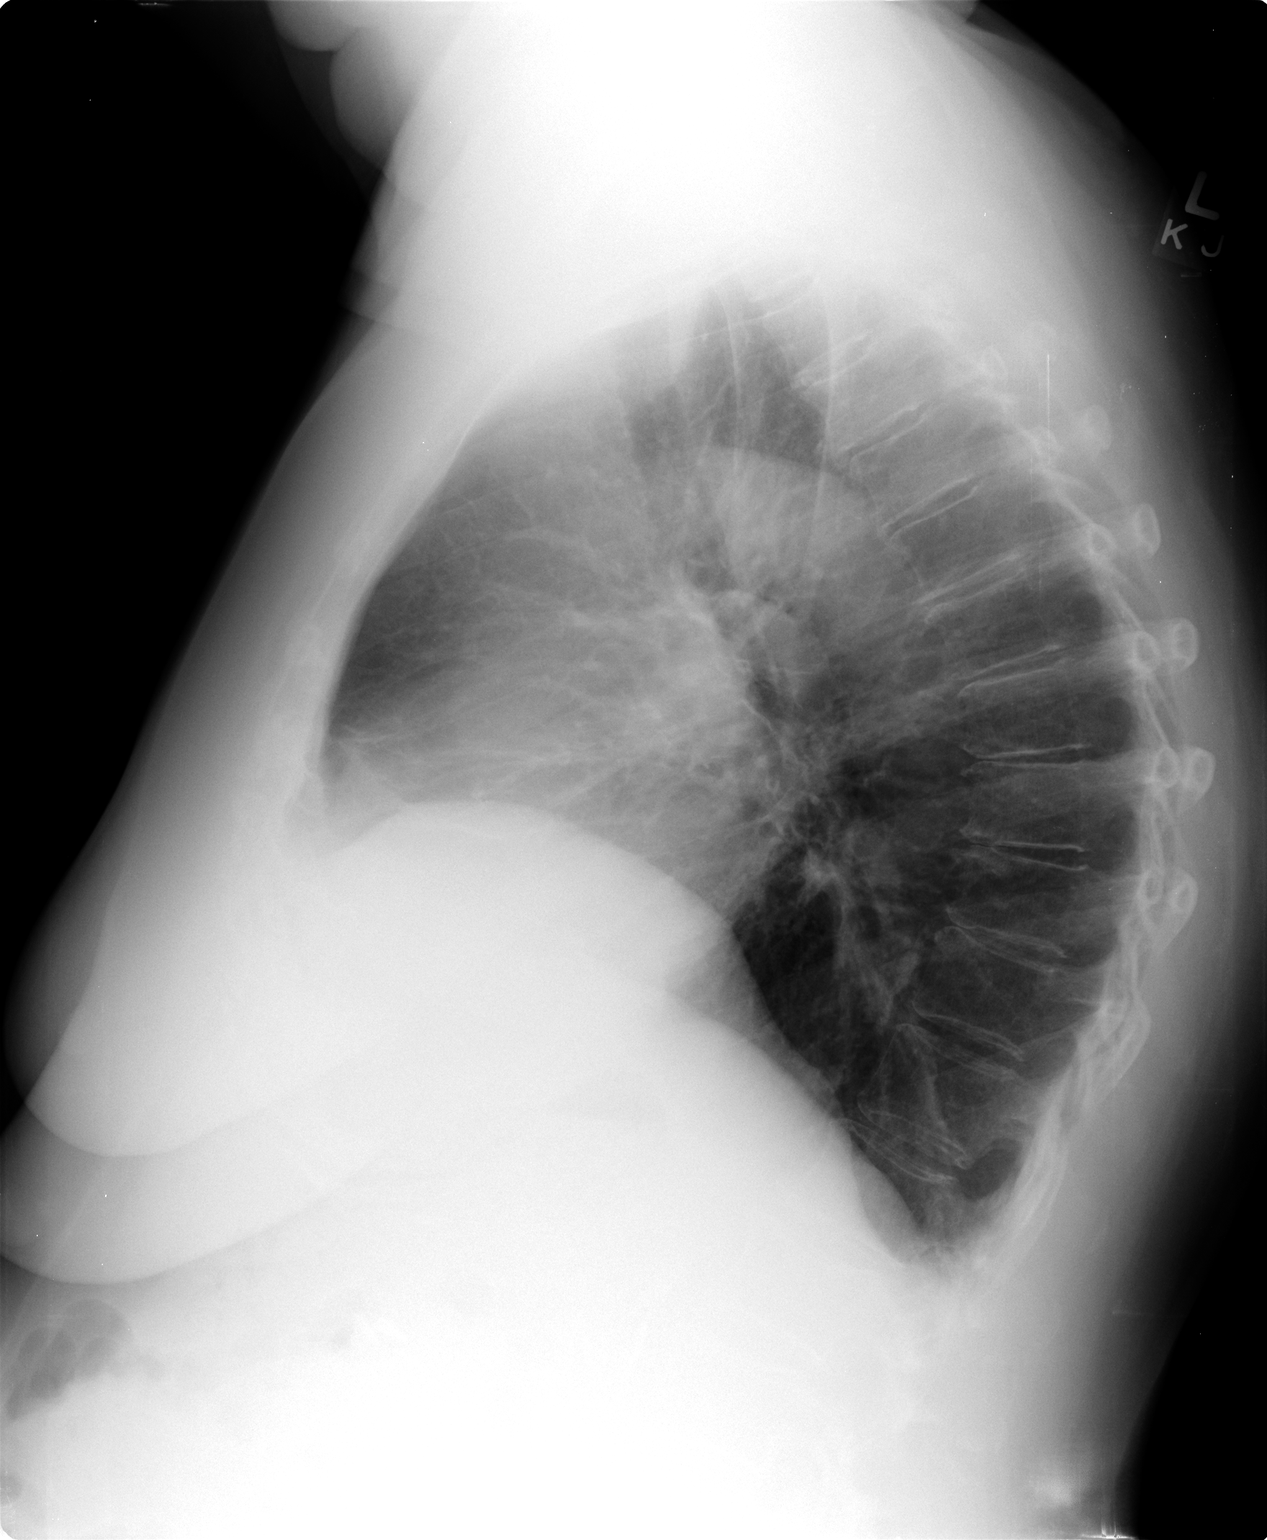

[2 of 2 positions shown; findings below may reference images not displayed]

FINDINGS: Cardiomediastinal silhouette is stable.  No acute
infiltrate or pleural effusion.  No pulmonary edema.  Stable
chronic elevation of the right hemidiaphragm.  Stable right basilar
linear scarring.
IMPRESSION: No active disease.  No significant change.

## 2012-06-09 NOTE — Patient Instructions (Addendum)
Today we updated your med list in our EPIC system...    Continue your current medications the same...  Today we did your follow up CXR & EKG as part of a pre-op assessment... Please return to our lab one morning soon for your FASTING blood work...    We will contact you w/ the results when available, and let DrFernandez know if everything is ok...  Call for any questions...  Let's plan a follow up visit in 6 month's time.Nichole KitchenMarland Silva

## 2012-06-10 ENCOUNTER — Other Ambulatory Visit: Payer: Self-pay | Admitting: Pulmonary Disease

## 2012-06-10 DIAGNOSIS — R928 Other abnormal and inconclusive findings on diagnostic imaging of breast: Secondary | ICD-10-CM

## 2012-06-11 ENCOUNTER — Encounter: Payer: Self-pay | Admitting: Pulmonary Disease

## 2012-06-11 NOTE — Progress Notes (Addendum)
Subjective:    Patient ID: Nichole Silva, female    DOB: 1935-03-07, 78 y.o.   MRN: [id number]  HPI 78 y/o WF here for a follow up visit...she has mult med problems as noted below... SEE PREV EPIC NOTES FOR EARLIER DATA >>  ~  February 23, 2012:  19mo ROV & [name] has had a good interval- no new complaints or concerns;  She has had some difficulty w/ her hearing aides & is working w/ [employer];  BP is controlled on her Procardia & Lasix, w/ persist 2-3+ edema/ VI changes- she knows to elim salt & wears support hose, weight is unchanged at 184-5#;  She denies CP, palpit, SOB, etc;  Lipids & DM management under good control w/ Simva80, Welchol2/d, & Metform500;  She credits the [facility] w/ helping her IBS as well- she is due for Colonoscopy & will call DrDBrodie to set this up...    We reviewed prob list, meds, xrays and labs> see below for updates >> OK 2013 Flu vaccine today...  ~  June 09, 2012:  61mo ROV & pre-op clearance requested by DrFernandez for possible vaginal prolapse surg in Feb;  She has a pessary now & GYN changes it every 55mo but she notes that she may need surg- she is incont & uses pads, she is very anxious & worries about infection, she denies pain, she notes bowel movements are OK w/ fiber supplements... We discussed checking CXR, EKG, & Fasting blood work... We reviewed the following medical problems during today's office visit>>      HOH> has hearing aides & doing satis...    AR & recurrent bronchitis> on Zyrtek, Flonase; she denies recent URI, bronchitic exac, cough/ phlegm, ch in SOB, edema, etc...    HBP> on Nifedipine60 & Lasix40- 1to2/d; BP= 142/64 & she denies CP, palpit, SOB, etc...    CAD> on ASA81; she is too sedentary but occas has to be on her feet for hrs at [employer] job; denies angina, etc...    VI> on low sodium, elevation, support hose & Lasix40 taking 1-2 daily; she has some chronic edema & dermatitis- Rx w/ topical steroid...    Chol> on Simva80 &  Welchol2/d; FLP 4/13 showed TChol 187, TG 113, HDL 55, LDL 110; FLP 1/14 shows TChol 161, TG 172, HDL 42, LDL 85    DM> on Metform500/d & diet; Labs 4/13 showed BS= 118, A1c=6.5; Labs 1/14 shows BS=137, A1c=6.4; Rec- continue same, better diet.    GI- IBS> on Bentyl10 prn; notes occas diarrhea & 1 immodium will usually do the trick; Colonoscopy 1/14 showed mod sigmoid divertics & rec for hi fiber diet...    GYN- Vag prolapse & followed by DrFernandez on Estradiol cream & pessary Rx but she may need surgery she says; she had an EColi UTI treated in Oct2013...    DJD> left knee pain since a fall & XRay w/ degen changes; she prefers just Tylenol prn & manages well...    VitD defic> on VitD 50K weekly; prev mammograms/ BMD/ etc- per DrFernandez; VitD level = 20 & rec to take 50K supplement every week.    Anxiety> on Xanax prn; incr stress w/ daugh etoh/drugs age 36 now doing better...  We reviewed prob list, meds, xrays and labs> see below for updates >>  CXR 1/14 showed stable heart size, elev right hemidiaph, scarring right base w/o change, NAD.Marland KitchenMarland Kitchen EKG 1/14 showed NSR, rate70, NSSTTWA... LABS 1/14:  FLP- ok on Simva80+Welchol;  Chems- ok  x BS=137, A1c=6.4;  CBC- wnl;  TSH=3.05;  VitD=20 & rec to continue 50K wkly...    Problem List:  DECREASED HEARING (ICD-389.9) - she has digital hearing aides bilat & is working thru [employer]...  ALLERGIC RHINITIS (ICD-477.9) - on ZYRTEK daily & FLONASE Qhs...  Hx of BRONCHITIS, RECURRENT (ICD-491.9) - she denies recent URIs or resp exac, takes [facility] as needed... ~  CXR 12/11 showed heart at upper lim of normal, lungs clear x mild peribronch thickening, NAD.Marland Kitchen. ~  CXR 1/14 showed stable heart size, elev right hemidiaph, scarring right base w/o change, NAD...  HYPERTENSION (ICD-401.9) - on PROCARDIA XL 60mg /d & LASIX 40mg  1-2 daily...  ~  CXR 4/11 showed mild basilar atelectasis, NAD.Marland Kitchen. ~  5/12:  We decided to incr LASIX to 40mg  Bid due to edema &  recheck... ~  10/12:  BP stable 7 chr edema improved w/ Lasix40mg  taking 1-2 daily... ~  4/13:  BP=132/76 today, and feeling well... BP's at home are similar range... denies HA, fatigue, visual changes, CP, palipit, dizziness, syncope, dyspnea, edema, etc... ~  10/13:  BP= 130/78 & feeling well; denies CP, palpit, SOB, ch in edema, etc... ~  1/14: on Nifedipine60 & Lasix40- 1to2/d; BP= 142/64 & she denies CP, palpit, SOB, etc...  CORONARY ARTERY DISEASE (ICD-414.00) - on ASA 81mg /d... no CP, palpit, or change in SOB. ~  cath 1989 by DrStuckey showed non-obstructive dis w/ 40-50% LAD lesion, otherw norm... ~  EKG shows NSR, NSSTTWA...  VENOUS INSUFFICIENCY (ICD-459.81) - she knows to elim sodium, elevate legs, wear support hose, & take the LASIX 40mg  ==> incr to Bid. ~  6/12:  Edema improved & wt down 3# on the Lasix 80mg /d; BUN=10, Creat=1.0, continue same dose... ~  4/13:  Edema stable & wt 185# on Lasix40- taking 1-2 tabs daily; BUN= 9, Creat= 0.9  HYPERLIPIDEMIA (ICD-272.4) - on SIMVASTATIN 80mg /d + WELCHOL 2/d... ~  FLP 9/08 on Vytorin 10-80 showed TChol 143, TG 100, HDL 44, LDL 79 ~  FLP 4/09 on Vytorin still showed TChol 153, TG 95, HDL 42, LDL 92... changed to Simva80 for $$. ~  FLP 10/09 still on the Vytorin showed TChol 166, TG 134, HDL 46, LDL 94 ~  FLP 4/10 on Simva80 showed TChol 203, TG 141, HDL 47, LDL 131... rec> same + diet! ~  FLP 10/10 on Simva80 showed TChol 176, TG 139, HDL 41, LDL 107 ~  FLP 4/11 showed TChol 213, TG 225, HDL 50, LDL 130... consider change med, ?add zetia, for now= BETTER DIET! ~  FLP 10/11 on Simva80+Welchol2 showed TChol 185, TG 232, HDL 53, LDL 103 ~  FLP 5/12 on Simva80+Welchol2 showed TChol 155, TG 111, HDL 43, LDL 90... Great job! ~  FLP 4/13 on Simva80+Welchol2 showed TChol 187, TG 113, HDL 55, LDL 110 ~  FLP 1/14 on Simva80+Welchol2 showed  TChol 161, TG 172, HDL 42, LDL 85   DIABETES MELLITUS (ICD-250.00) - on METFORMIN 500mg /d and diet... ~   labs 9/08 showed BS=121, HgA1c=6.3 ~  labs 4/09 showed BS= 111, HgA1c= 6.5.Marland KitchenMarland Kitchen continue same Rx. ~  labs 9/09 (wt=192#) showed BS= 131, HgA1c= 6.6.Marland Kitchen. same. ~  Labs 4/10 (wt=190#) showed BS= 122, A1c= 6.5 ~  labs 10/10 (wt=194#) showed BS= 120, A1c= 6.5 ~  labs 4/11 (wt=186#) showed BS= 124, A1c= 6.6 ~  labs 10/11 (wt=181#) showed BS= 115, A1c= 6.4 ~  Labs 5/12 (wt=182#) showed BS= 105, A1c= 6.8 ~  Labs 4/13 (wt=186#) showed BS=  110, A1c= 6.5 ~  Labs 1/14 (wt=183#) showed BS= 137, A1c= 6.4  IRRITABLE BOWEL SYNDROME (ICD-564.1) - she uses BENTYL 20mg  as needed... she tells me she wears depends for diarrhea prob. ~  last colonoscopy 2/02 by DrBrodie showed divertics only... f/u planned 84yrs. ~  4/11:  DrBrodie incr Bentyl to Tid & added WELCHOL 2tabs/d (diarrhea & incontinence improved) ~  4/13:  She is still taking the Welchol 2/d "it helps my IBS" she says... ~  1/14:  Colonoscopy 1/14 DrDBrodie showed mod sigmoid divertics & rec for hi fiber diet...  GYN >> Followed by DrFernandez w/ Vag Prolapse & his notes in EPIC are reviewed...  DEGENERATIVE JOINT DISEASE (ICD-715.90) - on MOBIC 7.5mg  Prn...  VITAMIN D DEFICIENCY (ICD-268.9) - on Vit D 50000 u daily... ~  labs 4/10 showed Vit D level = 6... therefore started on Vit D 50000 u weekly. ~  labs 4/11 showed Vit D level = 38... OK to change to 2000 u daily. ~  Labs 5/12 showed Vit D level = 25... Change back to VitD 50K weekly. ~  Labs 4/13 showed Vit D level = 20... Asked to take VitD 50K every week! ~  Labs 1/14 showed Vit D level = 20... Reminded to take the 50K supplement every week...  ANXIETY (ICD-300.00) - on ALPRAZOLAM 0.5mg  Prn... she's under alot of stress w/ her husb and a 4 y/o nephew dx w/ Marfan's, s/p valve surg, needs heart transplant...  GYN = DrMcPhail w/ BMD 1/06 @ [facility] showing norm w/ TScores -0.3 to -0.8.Marland KitchenMarland Kitchen   Past Surgical History  Procedure Date  . Dilation and curettage of uterus 1960's  . Cholecystectomy 1997   . Left elbow surgery 1992  . Cataract surg 2009    Outpatient Encounter Prescriptions as of 06/09/2012  Medication Sig Dispense Refill  . acetaminophen (TYLENOL) 325 MG tablet Take 325 mg by mouth every 6 (six) hours as needed. Pain/headache      . ALPRAZolam (XANAX) 0.5 MG tablet TAKE 1 TABLET BY MOUTH THREE TIMES DAILY AS NEEDED  90 tablet  5  . aspirin 81 MG tablet Take 81 mg by mouth daily.        . cetirizine (ZYRTEC) 10 MG tablet Take 10 mg by mouth daily.        Marland Kitchen dicyclomine (BENTYL) 10 MG capsule Take 1 capsule (10 mg total) by mouth 3 (three) times daily before meals.  90 capsule  3  . ergocalciferol (VITAMIN D2) 50000 UNITS capsule Take 50,000 Units by mouth every Monday.       . fluticasone (FLONASE) 50 MCG/ACT nasal spray Place 2 sprays into the nose daily as needed. allergies      . furosemide (LASIX) 40 MG tablet TAKE 2 TABLETS BY MOUTH EVERY DAY  60 tablet  0  . metFORMIN (GLUCOPHAGE) 500 MG tablet Take 500 mg by mouth daily.      Marland Kitchen NIFEdipine (PROCARDIA XL/ADALAT-CC) 60 MG 24 hr tablet Take 60 mg by mouth daily.      . NONFORMULARY OR COMPOUNDED ITEM Estradiol .02% 1 ML Prefilled Applicator Sig: apply vaginally twice a week #90 Day Supply with 4 refills  1 each  4  . simvastatin (ZOCOR) 80 MG tablet Take 80 mg by mouth at bedtime.      Lilian Kapur 625 MG tablet TAKE 2 TABLETS BY MOUTH DAILY  60 tablet  0  . [DISCONTINUED] colesevelam (WELCHOL) 625 MG tablet Take 625 mg by mouth 2 (two) times  daily with breakfast and lunch.      . [DISCONTINUED] furosemide (LASIX) 40 MG tablet Take 40-80 mg by mouth daily.        Allergies  Allergen Reactions  . Penicillins     REACTION: ITCHING AND SWELLING    Current Medications, Allergies, Past Medical History, Past Surgical History, Family History, and Social History were reviewed in [employer] record.   Review of Systems        See HPI - all other systems neg except as noted... The patient complains of  decreased hearing and dyspnea on exertion.  The patient denies anorexia, fever, weight loss, weight gain, vision loss, hoarseness, chest pain, syncope, peripheral edema, prolonged cough, headaches, hemoptysis, abdominal pain, melena, hematochezia, severe indigestion/heartburn, hematuria, incontinence, muscle weakness, suspicious skin lesions, transient blindness, difficulty walking, depression, unusual weight change, abnormal bleeding, enlarged lymph nodes, and angioedema.     Objective:   Physical Exam     WD, Overweight, 78 y/o WF in NAD... Vital Signs:  Reviewed... GENERAL:  Alert & oriented; pleasant & cooperative... HEENT:  [place]/AT, EOM-wnl, PERRLA, EACs-clear, TMs-wnl, NOSE-clear, THROAT-clear & wnl. NECK:  Supple w/ fairROM; no JVD; normal carotid impulses w/o bruits; no thyromegaly or nodules palpated; no lymphadenopathy. CHEST:  Clear to P & A; without wheezes/ rales/ or rhonchi. HEART:  Regular Rhythm; without murmurs/ rubs/ or gallops. ABDOMEN:  Soft & nontender; normal bowel sounds; no organomegaly or masses detected. EXT: without deformities, mild arthritic changes; no varicose veins/ +venous insuffic/ 1+edema (chronic) NEURO:  CN's intact;  no focal neuro deficits... DERM:  chronic dry skin dermatitis on legs...  RADIOLOGY DATA:  Reviewed in the EPIC EMR & discussed w/ the patient...  LABORATORY DATA:  Reviewed in the EPIC EMR & discussed w/ the patient...   Assessment & Plan:   Pre-op medical clearance>> OK for GYN surg...  HBP>  Controlled on CCB & Diuretic; continue same...  CAD>  Denies angina etc but she is too sedentary; rec incr exercise, continue ASA, continue BP meds, statin, DM rx etc...  VI/ Edema/ Dermatitis>  Improved w/ Lasix40 taking 1-2 daily & BMet looks OK- continue same; dermatitis much improved w/ the HC ointment...  LIPIDS>  Stable on Simva, continue same for now> needs better diet, get wt down...  DM>  Stable on the Metformin, needs better diet,  get wt down...  Vit D defic>  rec to return to Vit D Rx 50K weekly...   Patient's Medications  New Prescriptions   No medications on file  Previous Medications   ACETAMINOPHEN (TYLENOL) 325 MG TABLET    Take 325 mg by mouth every 6 (six) hours as needed. Pain/headache   ALPRAZOLAM (XANAX) 0.5 MG TABLET    TAKE 1 TABLET BY MOUTH THREE TIMES DAILY AS NEEDED   ASPIRIN 81 MG TABLET    Take 81 mg by mouth daily.     CETIRIZINE (ZYRTEC) 10 MG TABLET    Take 10 mg by mouth daily.     DICYCLOMINE (BENTYL) 10 MG CAPSULE    Take 1 capsule (10 mg total) by mouth 3 (three) times daily before meals.   ERGOCALCIFEROL (VITAMIN D2) 50000 UNITS CAPSULE    Take 50,000 Units by mouth every Monday.    FLUTICASONE (FLONASE) 50 MCG/ACT NASAL SPRAY    Place 2 sprays into the nose daily as needed. allergies   FUROSEMIDE (LASIX) 40 MG TABLET    TAKE 2 TABLETS BY MOUTH EVERY DAY   METFORMIN (GLUCOPHAGE)  500 MG TABLET    Take 500 mg by mouth daily.   NIFEDIPINE (PROCARDIA XL/ADALAT-CC) 60 MG 24 HR TABLET    Take 60 mg by mouth daily.   NONFORMULARY OR COMPOUNDED ITEM    Estradiol .02% 1 ML Prefilled Applicator Sig: apply vaginally twice a week #90 Day Supply with 4 refills   SIMVASTATIN (ZOCOR) 80 MG TABLET    Take 80 mg by mouth at bedtime.   WELCHOL 625 MG TABLET    TAKE 2 TABLETS BY MOUTH DAILY  Modified Medications   No medications on file  Discontinued Medications   COLESEVELAM (WELCHOL) 625 MG TABLET    Take 625 mg by mouth 2 (two) times daily with breakfast and lunch.   FUROSEMIDE (LASIX) 40 MG TABLET    Take 40-80 mg by mouth daily.

## 2012-06-13 ENCOUNTER — Other Ambulatory Visit (INDEPENDENT_AMBULATORY_CARE_PROVIDER_SITE_OTHER): Payer: Medicare Other

## 2012-06-13 DIAGNOSIS — F411 Generalized anxiety disorder: Secondary | ICD-10-CM

## 2012-06-13 DIAGNOSIS — I10 Essential (primary) hypertension: Secondary | ICD-10-CM

## 2012-06-13 DIAGNOSIS — E559 Vitamin D deficiency, unspecified: Secondary | ICD-10-CM

## 2012-06-13 DIAGNOSIS — K589 Irritable bowel syndrome without diarrhea: Secondary | ICD-10-CM

## 2012-06-13 DIAGNOSIS — E119 Type 2 diabetes mellitus without complications: Secondary | ICD-10-CM

## 2012-06-13 DIAGNOSIS — E785 Hyperlipidemia, unspecified: Secondary | ICD-10-CM

## 2012-06-13 LAB — CBC WITH DIFFERENTIAL/PLATELET
Basophils Relative: 0.7 % (ref 0.0–3.0)
Eosinophils Relative: 3.3 % (ref 0.0–5.0)
HCT: 39.3 % (ref 36.0–46.0)
Hemoglobin: 13.2 g/dL (ref 12.0–15.0)
Lymphs Abs: 2.9 10*3/uL (ref 0.7–4.0)
MCV: 91.8 fl (ref 78.0–100.0)
Monocytes Absolute: 0.7 10*3/uL (ref 0.1–1.0)
Monocytes Relative: 8.9 % (ref 3.0–12.0)
RBC: 4.28 Mil/uL (ref 3.87–5.11)
WBC: 7.7 10*3/uL (ref 4.5–10.5)

## 2012-06-13 LAB — URINALYSIS
Bilirubin Urine: NEGATIVE
Hgb urine dipstick: NEGATIVE
Nitrite: NEGATIVE
Total Protein, Urine: NEGATIVE
Urobilinogen, UA: 0.2 (ref 0.0–1.0)

## 2012-06-13 LAB — BASIC METABOLIC PANEL
CO2: 28 mEq/L (ref 19–32)
Glucose, Bld: 137 mg/dL — ABNORMAL HIGH (ref 70–99)
Potassium: 4 mEq/L (ref 3.5–5.1)
Sodium: 140 mEq/L (ref 135–145)

## 2012-06-13 LAB — HEPATIC FUNCTION PANEL: Total Bilirubin: 0.5 mg/dL (ref 0.3–1.2)

## 2012-06-13 LAB — LIPID PANEL
Cholesterol: 161 mg/dL (ref 0–200)
HDL: 42 mg/dL (ref >39.00)

## 2012-06-14 LAB — VITAMIN D 25 HYDROXY (VIT D DEFICIENCY, FRACTURES): Vit D, 25-Hydroxy: 20 ng/mL — ABNORMAL LOW (ref 30–89)

## 2012-06-14 NOTE — Progress Notes (Signed)
Quick Note:  ATC patient, no answer LMOMTCB ______ 

## 2012-06-14 NOTE — Progress Notes (Signed)
Quick Note:  ATC pt no answer. LMOMTCB ______

## 2012-06-14 NOTE — Progress Notes (Signed)
Quick Note:  Spoke with patients daughter made her aware of results as listed below as listed below per Dr. Nadel.Verbalized understanding and Nothing further needed. ______ 

## 2012-06-14 NOTE — Progress Notes (Signed)
Quick Note:  Spoke with patients daughter made her aware of results as listed below as listed below per Dr. Kriste Basque.Verbalized understanding and Nothing further needed. ______

## 2012-06-15 ENCOUNTER — Telehealth: Payer: Self-pay | Admitting: Pulmonary Disease

## 2012-06-15 NOTE — Telephone Encounter (Signed)
I spoke with daughter. She stated she already had her questions answered. She is going to have the 3D done. Nothing further was needed

## 2012-06-23 ENCOUNTER — Ambulatory Visit
Admission: RE | Admit: 2012-06-23 | Discharge: 2012-06-23 | Disposition: A | Discharge status: Home / Self Care | Payer: Medicare Other | Source: Ambulatory Visit | Attending: Pulmonary Disease | Admitting: Pulmonary Disease

## 2012-06-23 ENCOUNTER — Telehealth: Payer: Self-pay | Admitting: Pulmonary Disease

## 2012-06-23 DIAGNOSIS — R928 Other abnormal and inconclusive findings on diagnostic imaging of breast: Secondary | ICD-10-CM

## 2012-06-23 IMAGING — US US BREAST*R*
1 series · 7 of 7 positions shown · non-contrast
Comparison: June 07, 2012

CLINICAL DATA: Called back from screening mammogram for possible
masses right breast.

DIGITAL DIAGNOSTIC RIGHT MAMMOGRAM June 23, 2012 AND RIGHT
BREAST ULTRASOUND:

[Series 1: us breast*right* · 7 of 7 slices shown]
[im 1/7]
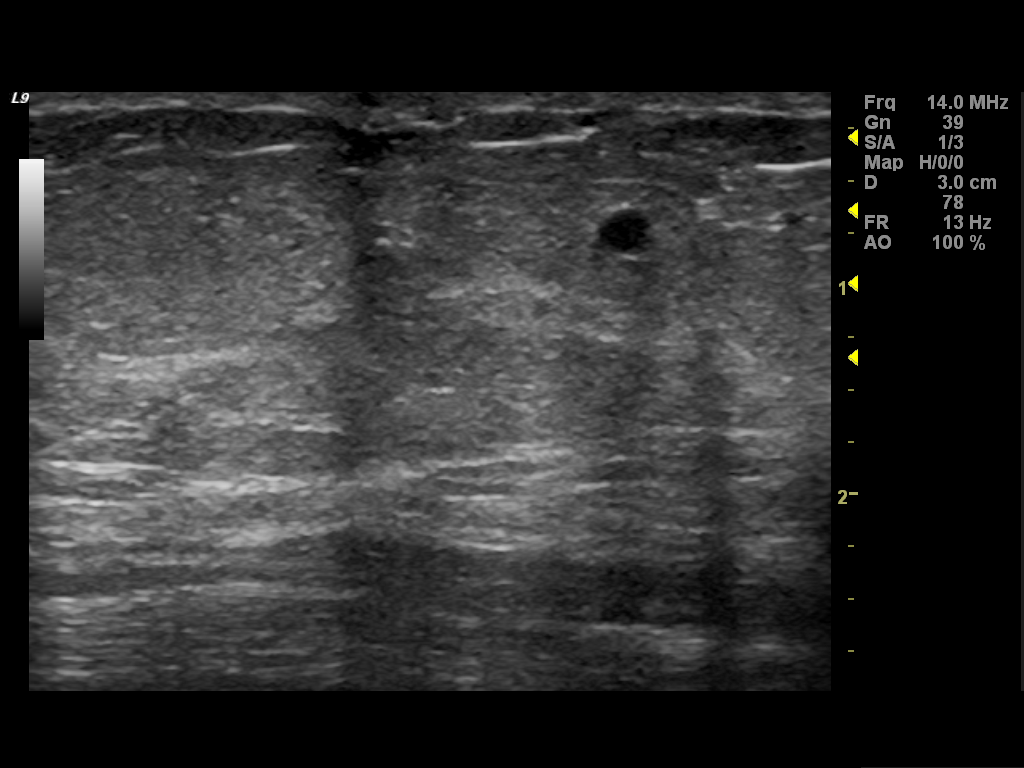
[im 2/7]
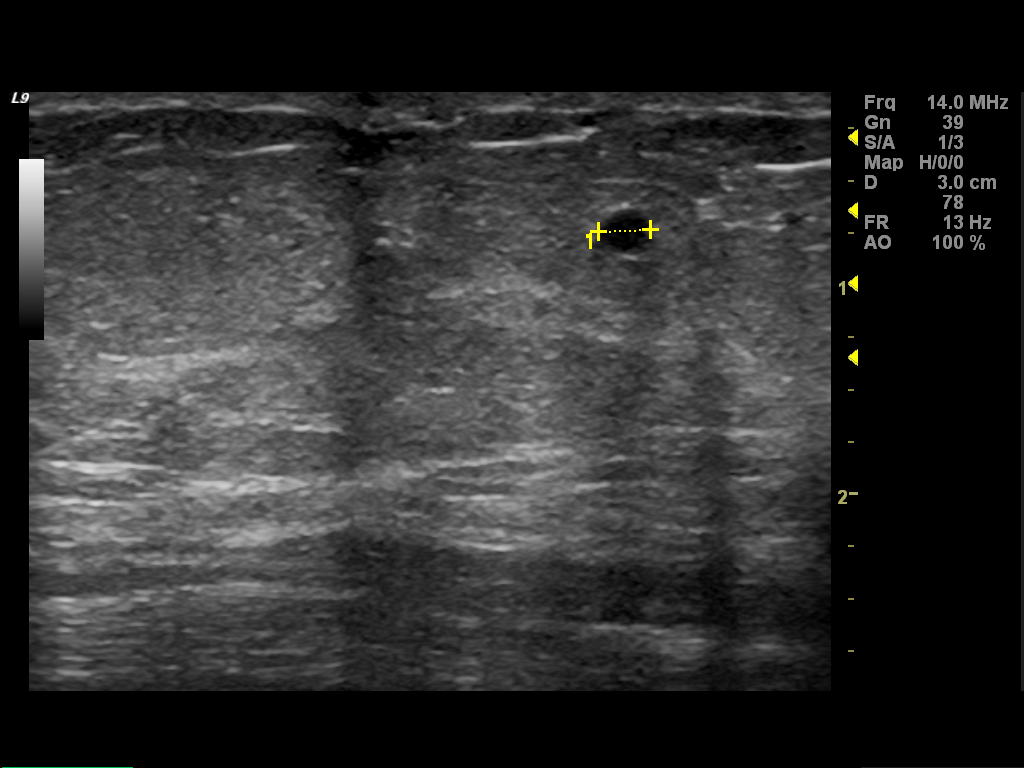
[im 3/7]
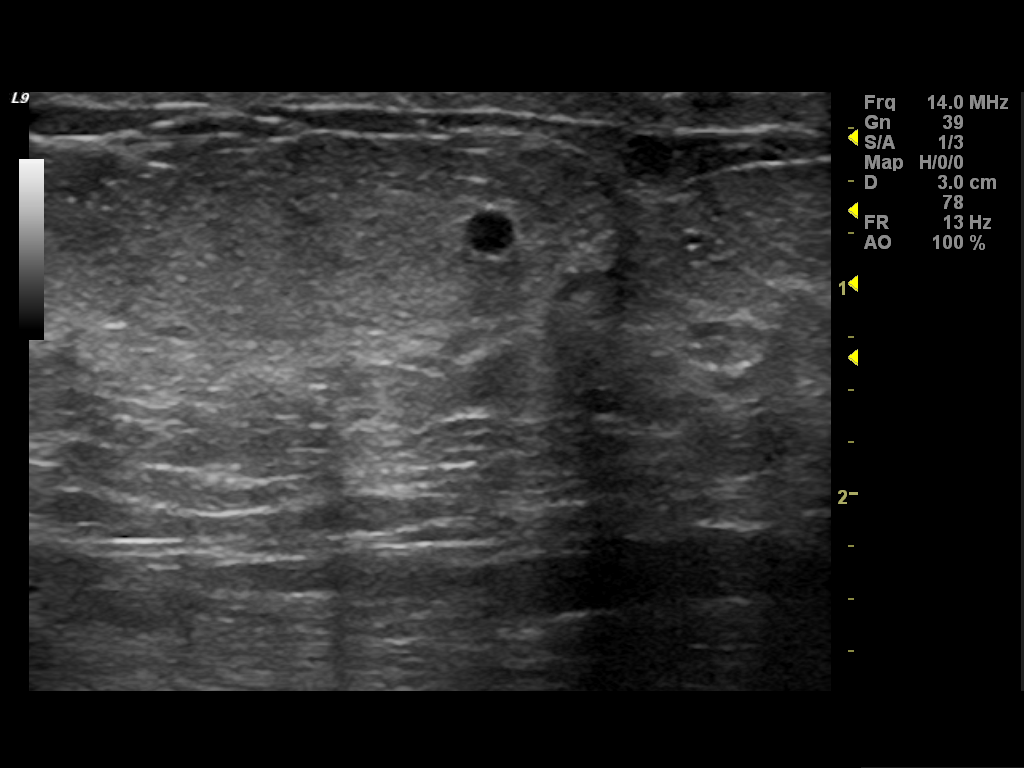
[im 4/7]
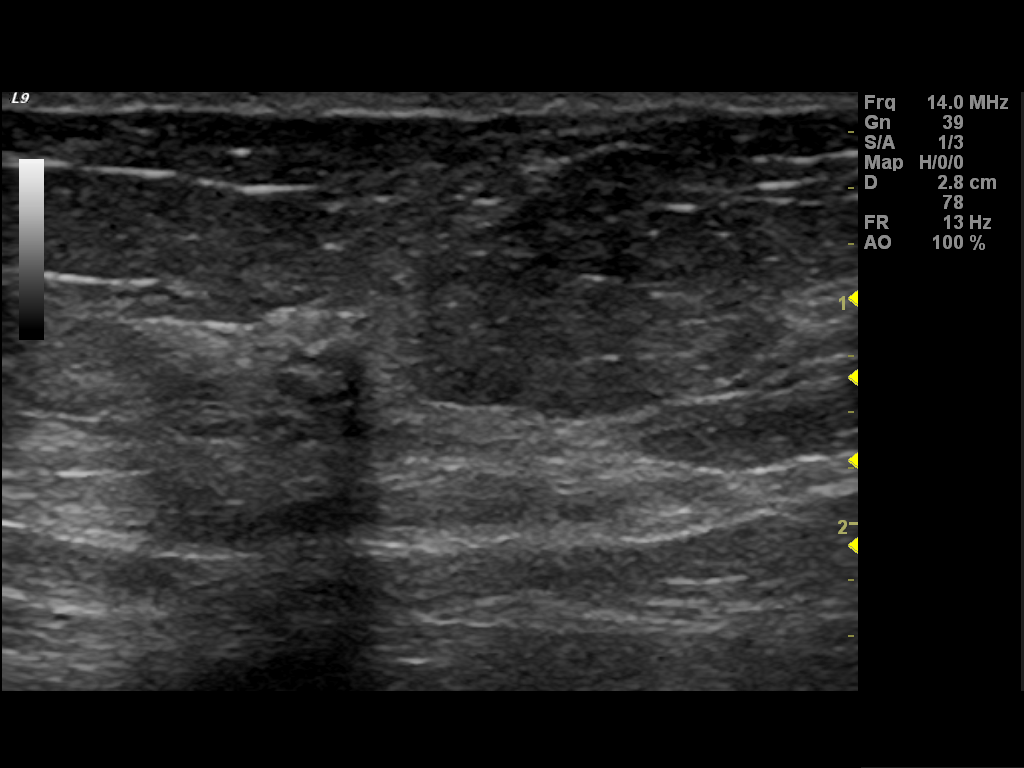
[im 5/7]
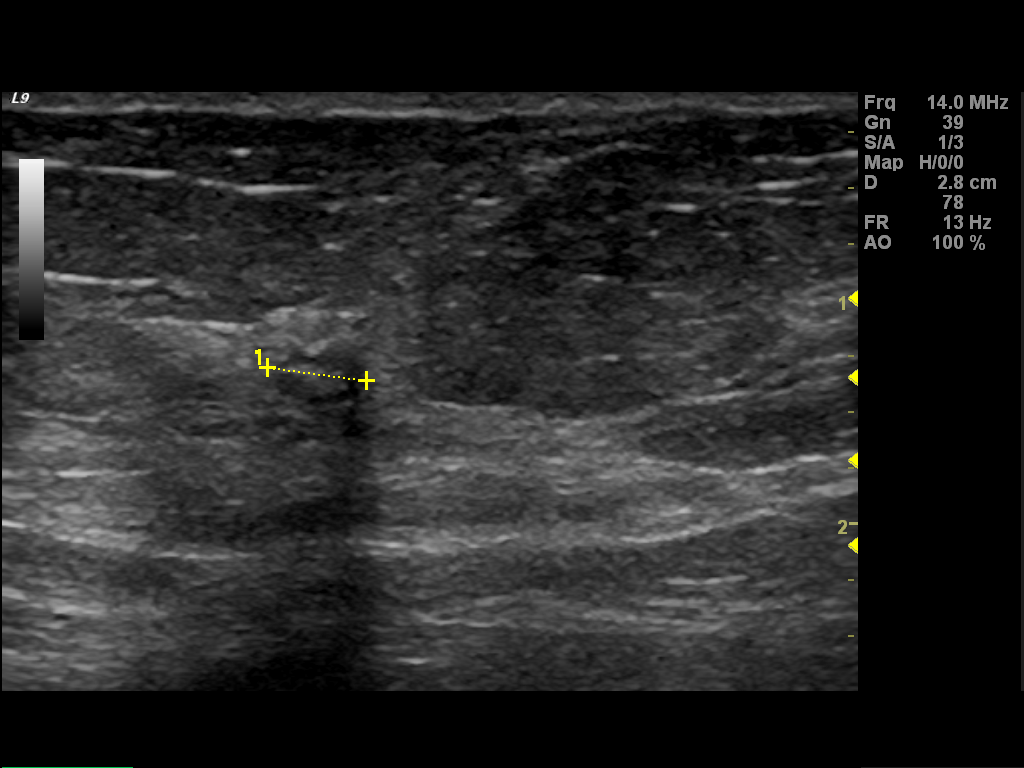
[im 6/7]
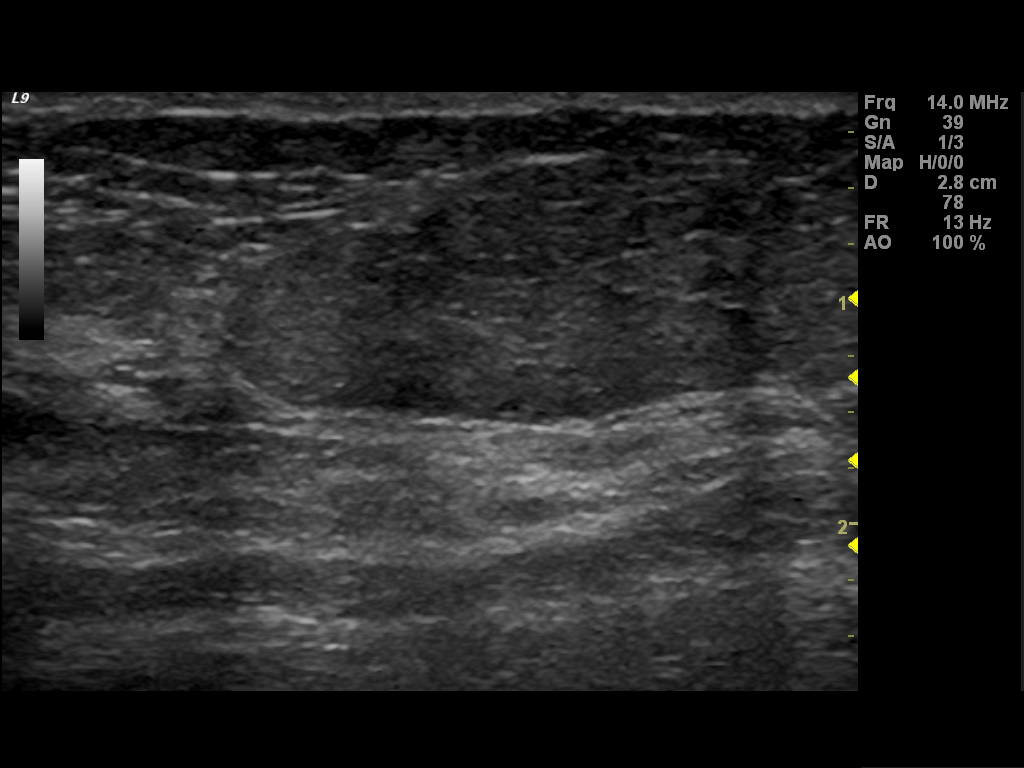
[im 7/7]
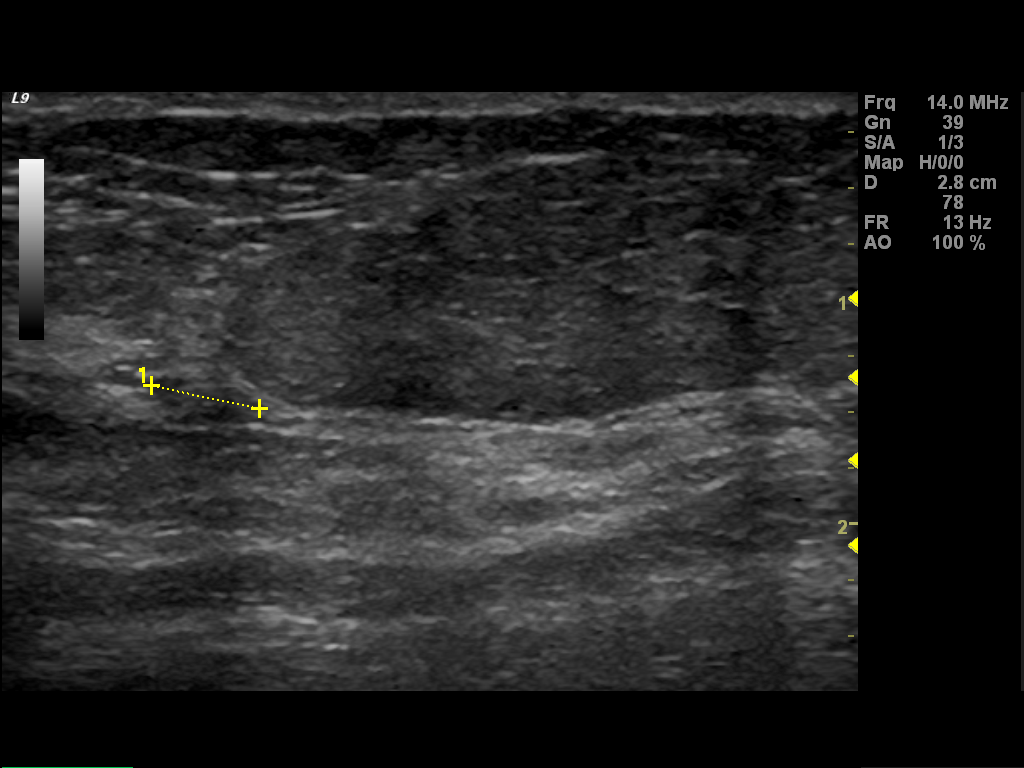

[7 of 7 positions shown; findings below may reference images not displayed]

FINDINGS: ACR Breast Density Category scattered fibroglandular tissue

Spot compression CC and MLO views of the right breast are
submitted.  Previously noted asymmetries are less prominent and do
not persist on spot compression views.

Ultrasound is performed, showing a 0.25 cm simple cyst at the right
breast 10 o'clock subareolar region correlating to the asymmetry
seen anteriorly in the retroareolar right breast.  There is a
cm oval hypoechoic lesion with central increased echo texture at
the right breast 10 o'clock position 6 cm from the nipple
correlating to the mammographic finding.  This probably an
intramammary lymph node.

At the patient's request we call her doctor's office for the site
of her old films.  We called her referring doctor's office who has
no record of her having prior mammograms.
IMPRESSION: Probable benign findings.

RECOMMENDATION:
6-month follow-up mammogram and ultrasound right breast for the
right breast 10 o'clock 6 cm from nipple lesion.

I have discussed the findings and recommendations with the patient.
Results were also provided in writing at the conclusion of the
visit.

BI-RADS CATEGORY 3:  Probably benign finding(s) - short interval
follow-up suggested.

## 2012-06-23 IMAGING — MG MM DIGITAL DIAGNOSTIC LIMITED*R*
2 series · 2 of 2 positions shown · non-contrast
Comparison: June 07, 2012

CLINICAL DATA: Called back from screening mammogram for possible
masses right breast.

DIGITAL DIAGNOSTIC RIGHT MAMMOGRAM June 23, 2012 AND RIGHT
BREAST ULTRASOUND:

[R CC]
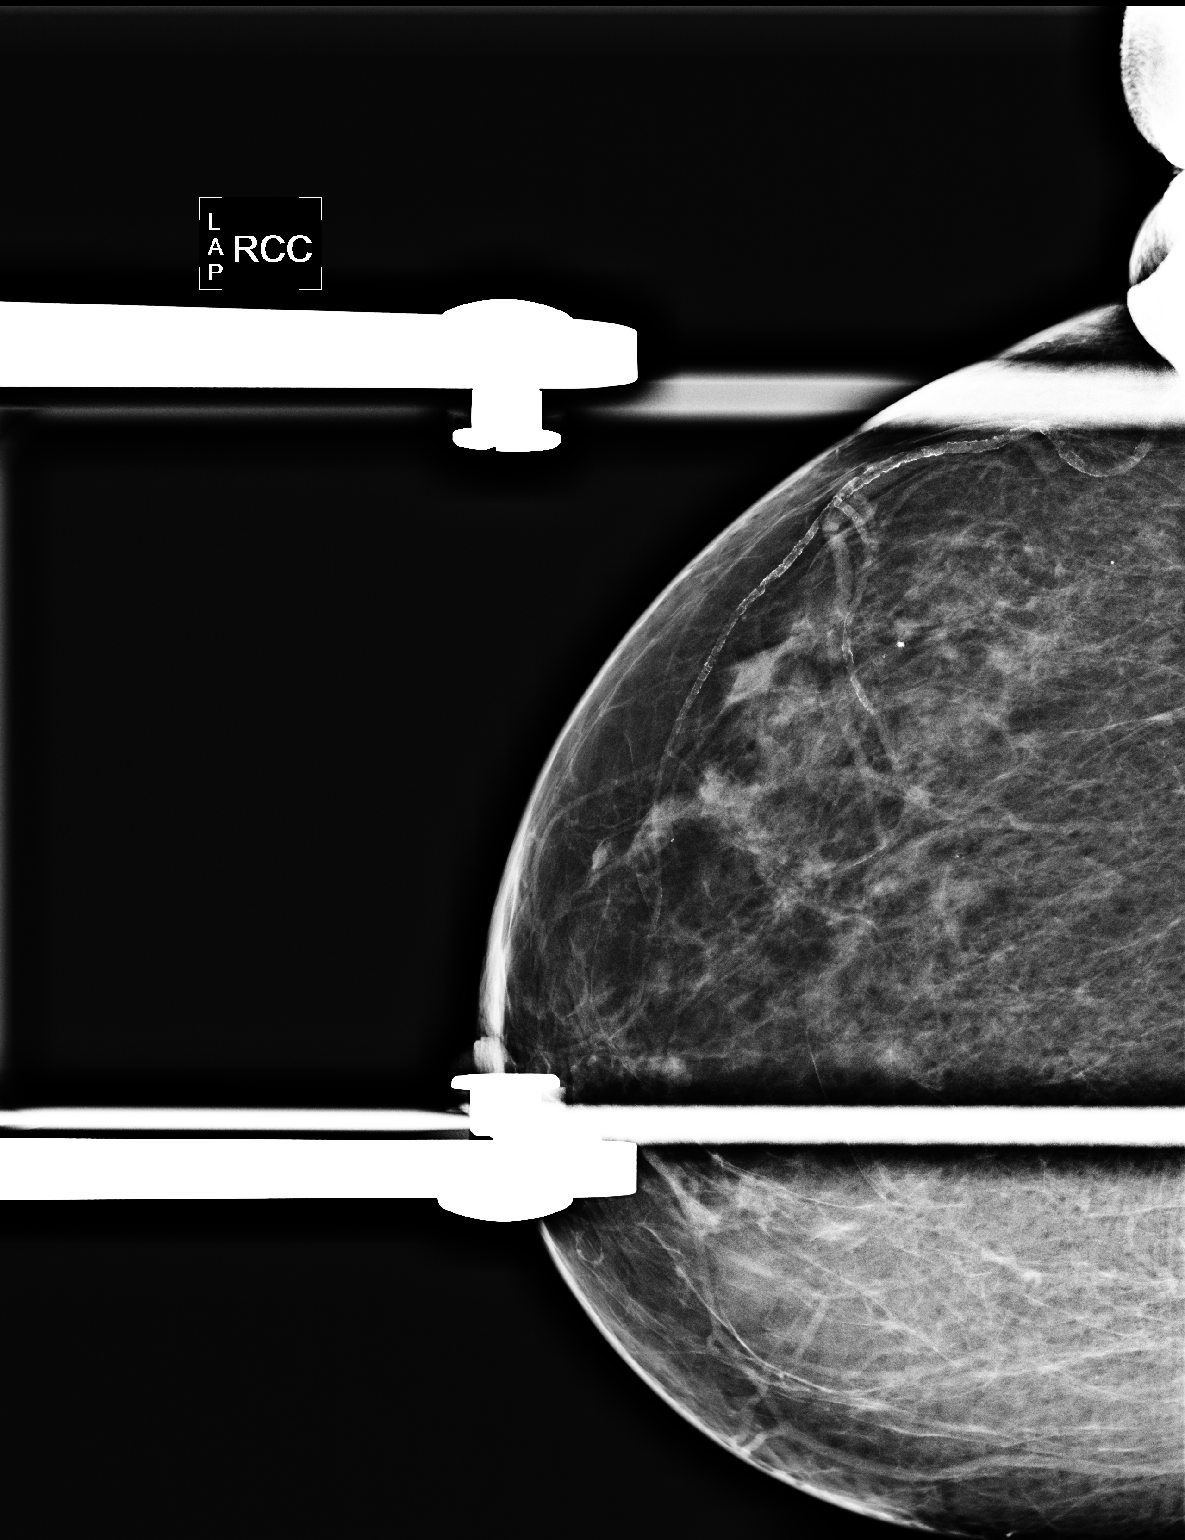

[R MLO]
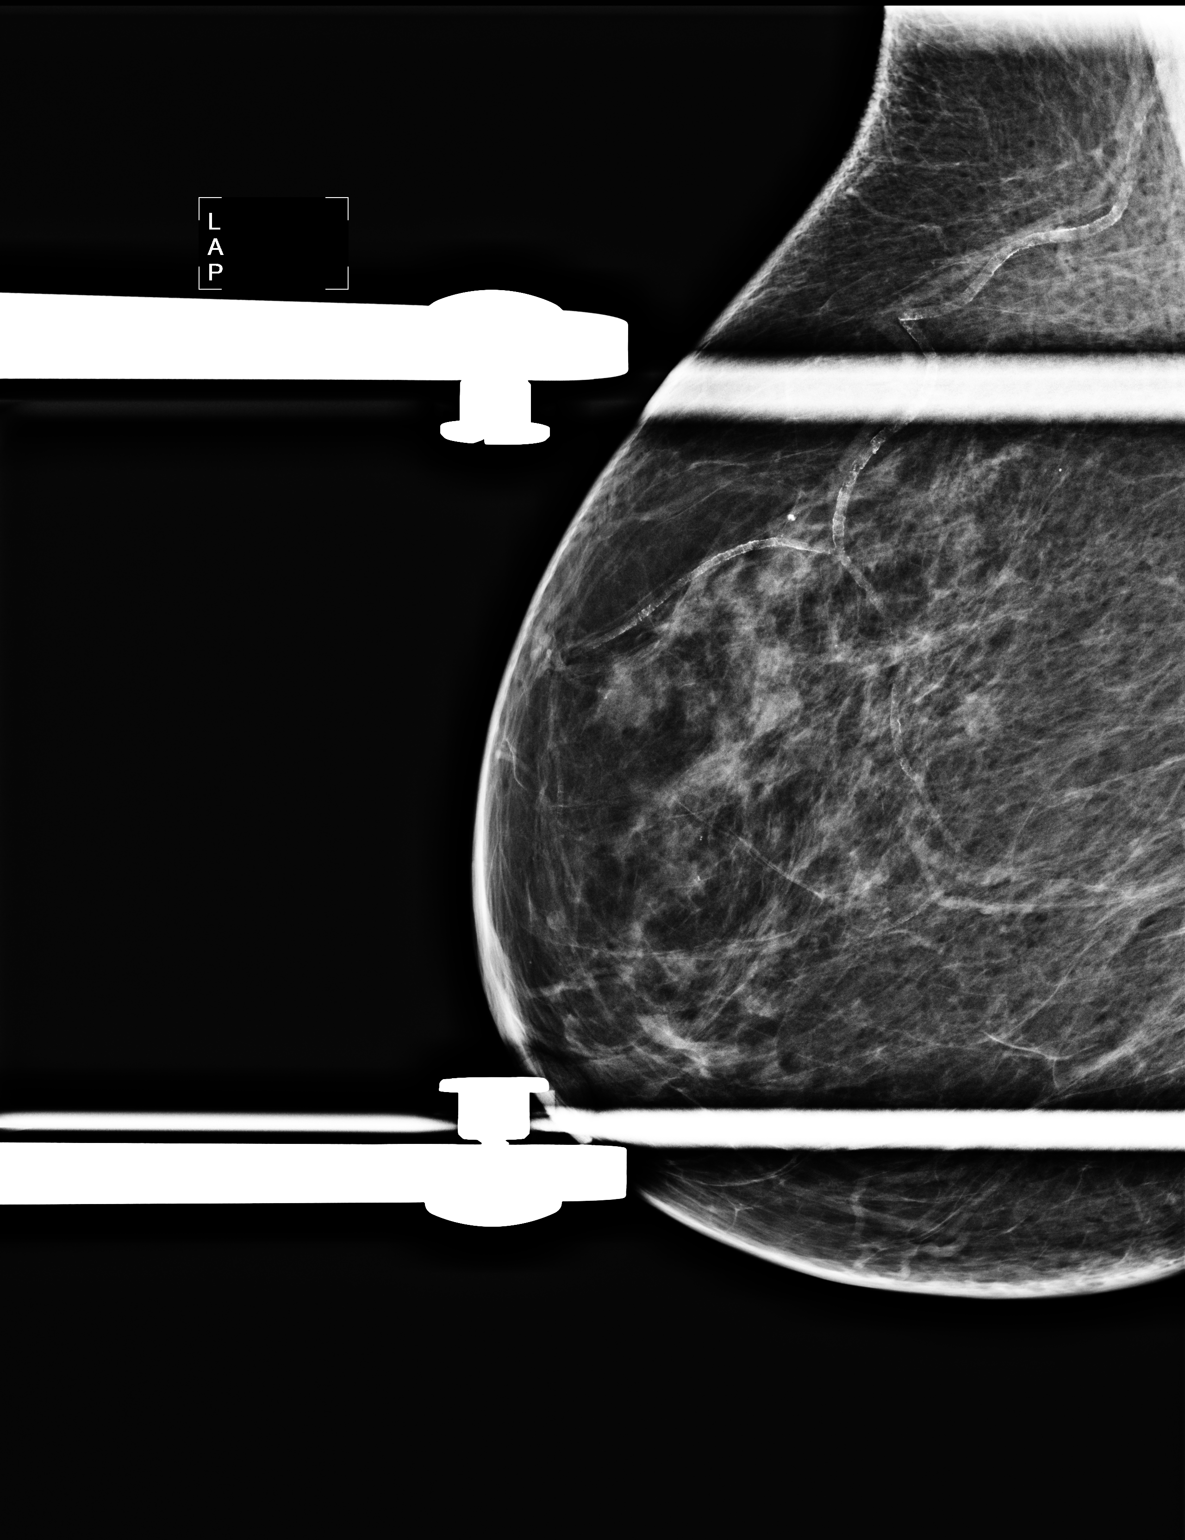

[2 of 2 positions shown; findings below may reference images not displayed]

FINDINGS: ACR Breast Density Category scattered fibroglandular tissue

Spot compression CC and MLO views of the right breast are
submitted.  Previously noted asymmetries are less prominent and do
not persist on spot compression views.

Ultrasound is performed, showing a 0.25 cm simple cyst at the right
breast 10 o'clock subareolar region correlating to the asymmetry
seen anteriorly in the retroareolar right breast.  There is a
cm oval hypoechoic lesion with central increased echo texture at
the right breast 10 o'clock position 6 cm from the nipple
correlating to the mammographic finding.  This probably an
intramammary lymph node.

At the patient's request we call her doctor's office for the site
of her old films.  We called her referring doctor's office who has
no record of her having prior mammograms.
IMPRESSION: Probable benign findings.

RECOMMENDATION:
6-month follow-up mammogram and ultrasound right breast for the
right breast 10 o'clock 6 cm from nipple lesion.

I have discussed the findings and recommendations with the patient.
Results were also provided in writing at the conclusion of the
visit.

BI-RADS CATEGORY 3:  Probably benign finding(s) - short interval
follow-up suggested.

## 2012-06-23 NOTE — Telephone Encounter (Signed)
Per previous phone notes we do not have any record of her last mammogram. I advised Leanne of this. Carron Curie, CMA

## 2012-06-27 ENCOUNTER — Ambulatory Visit (INDEPENDENT_AMBULATORY_CARE_PROVIDER_SITE_OTHER): Payer: Medicare Other | Admitting: Gynecology

## 2012-06-27 ENCOUNTER — Encounter: Payer: Self-pay | Admitting: Gynecology

## 2012-06-27 ENCOUNTER — Other Ambulatory Visit: Payer: Self-pay | Admitting: Pulmonary Disease

## 2012-06-27 VITALS — BP 132/86

## 2012-06-27 DIAGNOSIS — N814 Uterovaginal prolapse, unspecified: Secondary | ICD-10-CM

## 2012-06-27 DIAGNOSIS — Z4689 Encounter for fitting and adjustment of other specified devices: Secondary | ICD-10-CM

## 2012-06-27 DIAGNOSIS — N8110 Cystocele, unspecified: Secondary | ICD-10-CM

## 2012-06-27 DIAGNOSIS — N8111 Cystocele, midline: Secondary | ICD-10-CM

## 2012-06-27 DIAGNOSIS — N812 Incomplete uterovaginal prolapse: Secondary | ICD-10-CM

## 2012-06-27 NOTE — Patient Instructions (Signed)
Hysterectomy Information A hysterectomy is a surgery to remove your uterus. After surgery, you will not have periods (menses) or be able to get pregnant.  REASONS FOR THIS SURGERY  You have bleeding that is not normal and keeps coming back.  You have lasting (chronic) lower belly (pelvic) pain.  You have a lasting infection.  The lining of your uterus grows outside your uterus.  Your uterus falls down into your vagina.  You have a growth in your uterus that causes problems.  You have cells that could turn into cancer (precancerous cells).  You have cancer of the uterus or cervix. TYPES  There are 3 types of hysterectomies. Depending on the type, the surgery will:  Remove the top part of the uterus only.  Remove the uterus and the cervix.  Remove the uterus, cervix, and tissue that holds the uterus in place in the lower belly. WAYS A HYSTERECTOMY CAN BE PERFORMED There are 5 ways this surgery can be performed.   A cut (incision) is made in the belly (abdomen). The uterus is taken out through the cut.  A cut is made in the vagina. The uterus is taken out through the cut.  Three to four cuts are made in the belly. A surgical device is put through the cuts. The uterus is cut into small pieces by the surgical device. The uterus is taken out through the cuts or the vagina.  Three or four cuts are made in the belly. A surgical device is put through the cuts. The uterus is taken out through the vagina.  Three or four cuts are made in the belly. A surgical device that is controlled by a computer makes a visual image. The image helps the surgeon control the surgical device. The uterus is cut into small pieces. The pieces are taken out though the cuts or through the vagina. WHAT TO EXPECT AFTER THE SURGERY  You will be given pain medicine.  You will need help at home for 3 to 5 days after surgery.  You will need to see your doctor in 2 to 4 weeks after surgery.  You may get hot  flashes, night sweats, and have trouble sleeping.  You may need to have Pap tests in the future if your surgery was related to cancer. Talk to your doctor. It is still good to have regular exams. Document Released: 08/03/2011 Document Reviewed: 08/03/2011 [facility] Patient Information 2013 [place], [place].

## 2012-06-27 NOTE — Progress Notes (Signed)
Patient is a 78 year old who presented to the office today for followup and complaining of her ring vaginal pessary. Please see previous detail note from 04/26/2012. Patient stated that she has done well she does not feel the bulging sensation and she experienced before although she has been drinking all of water states that time she leaks urine. On that last office visit her examination had demonstrated a second-degree cystocele with first degree uterine prolapse no rectocele. Patient had a normal Q-tip angle tests of less than 30 on Valsalva (normal urethrovesical angle).On correcting the cystocele there was no incontinence. In the erect position there was minimal uterine descensus and second-degree cystocele was noted. On coughing the patient did not leak any urine. And some vaginal atrophy had been noted.  She has been applying estrogen vaginal cream twice a week. The pessary was removed today was cleaned of vaginal inspection did not demonstrate any evidence of erosion and moist epithelium since she has been applying the estrogen cream twice a week.  Patient will be instructed to cut down on her fluid intake. Instruction sheet on this  Kaegel exercises was provi. She will return back in 2 months for followup and for cleaning of the pessary. She will continue to apply the estrogen cream twice a week. Her internist Dr. Bobbe Medico had seen her recently and cleared her for surgery. If the pessary does not work well for her she stated that she would be interested in doing a hysterectomy. Her hysterectomy would be transvaginal with attempted bilateral salpingo-oophorectomy along with anterior colporrhaphy. Literature information was provided. She would need a urodynamic evaluation before the hysterectomy if we go that route. We will reassess in April since it has only been one month since she has been with the pessary.

## 2012-07-23 ENCOUNTER — Other Ambulatory Visit: Payer: Self-pay | Admitting: Pulmonary Disease

## 2012-08-23 ENCOUNTER — Ambulatory Visit: Payer: Medicare Other | Admitting: Pulmonary Disease

## 2012-08-26 ENCOUNTER — Ambulatory Visit (INDEPENDENT_AMBULATORY_CARE_PROVIDER_SITE_OTHER): Payer: Self-pay | Admitting: Gynecology

## 2012-08-26 ENCOUNTER — Encounter: Payer: Self-pay | Admitting: Gynecology

## 2012-08-26 VITALS — BP 130/86

## 2012-08-26 DIAGNOSIS — N8110 Cystocele, unspecified: Secondary | ICD-10-CM

## 2012-08-26 DIAGNOSIS — N812 Incomplete uterovaginal prolapse: Secondary | ICD-10-CM

## 2012-08-26 DIAGNOSIS — N814 Uterovaginal prolapse, unspecified: Secondary | ICD-10-CM

## 2012-08-26 DIAGNOSIS — N8111 Cystocele, midline: Secondary | ICD-10-CM

## 2012-08-26 NOTE — Progress Notes (Signed)
Patient's a 78 year old who has a second-degree cystocele first-degree uterine prolapse with no stress incontinence was placed on a 2-1/2 inch donut pessary and patient has done well with no complaints of bloating sensation. She had been applying the Estrace vaginal cream twice a week. She also been doing Kegel exercises. We were ready pivotal point today to decide whether to proceed with surgery depending on her symptoms but she stated she's doing well with the pessary and she would rather avoid surgery.  The pessary was removed cleaned and placed back into the vagina with estrogen cream. Prior to reinsertion of the pessary the vaginal epithelium was inspected there was no evidence of infection or erosion. Patient will continue to return to the office now in 3 months for followup.

## 2012-09-04 ENCOUNTER — Other Ambulatory Visit: Payer: Self-pay | Admitting: Pulmonary Disease

## 2012-09-21 ENCOUNTER — Other Ambulatory Visit: Payer: Self-pay | Admitting: Pulmonary Disease

## 2012-09-23 ENCOUNTER — Other Ambulatory Visit: Payer: Self-pay | Admitting: Pulmonary Disease

## 2012-10-26 ENCOUNTER — Other Ambulatory Visit: Payer: Self-pay | Admitting: Pulmonary Disease

## 2012-11-11 ENCOUNTER — Other Ambulatory Visit: Payer: Self-pay | Admitting: Pulmonary Disease

## 2012-11-11 DIAGNOSIS — N649 Disorder of breast, unspecified: Secondary | ICD-10-CM

## 2012-11-16 ENCOUNTER — Other Ambulatory Visit: Payer: Self-pay | Admitting: Pulmonary Disease

## 2012-11-25 ENCOUNTER — Other Ambulatory Visit: Payer: Self-pay | Admitting: Pulmonary Disease

## 2012-12-07 ENCOUNTER — Encounter: Payer: Self-pay | Admitting: Pulmonary Disease

## 2012-12-07 ENCOUNTER — Ambulatory Visit (INDEPENDENT_AMBULATORY_CARE_PROVIDER_SITE_OTHER): Payer: Medicare Other | Admitting: Pulmonary Disease

## 2012-12-07 VITALS — BP 130/74 | HR 70 | Temp 98.2°F | Ht 64.0 in | Wt 184.2 lb

## 2012-12-07 DIAGNOSIS — E559 Vitamin D deficiency, unspecified: Secondary | ICD-10-CM

## 2012-12-07 DIAGNOSIS — I2510 Atherosclerotic heart disease of native coronary artery without angina pectoris: Secondary | ICD-10-CM

## 2012-12-07 DIAGNOSIS — I10 Essential (primary) hypertension: Secondary | ICD-10-CM

## 2012-12-07 DIAGNOSIS — I872 Venous insufficiency (chronic) (peripheral): Secondary | ICD-10-CM

## 2012-12-07 DIAGNOSIS — M1990 Unspecified osteoarthritis, unspecified site: Secondary | ICD-10-CM

## 2012-12-07 DIAGNOSIS — F411 Generalized anxiety disorder: Secondary | ICD-10-CM

## 2012-12-07 DIAGNOSIS — K589 Irritable bowel syndrome without diarrhea: Secondary | ICD-10-CM

## 2012-12-07 DIAGNOSIS — E119 Type 2 diabetes mellitus without complications: Secondary | ICD-10-CM

## 2012-12-07 DIAGNOSIS — E785 Hyperlipidemia, unspecified: Secondary | ICD-10-CM

## 2012-12-07 NOTE — Progress Notes (Signed)
Subjective:    Patient ID: Nichole Silva, female    DOB: [birth year], 78 y.o.   MRN: [id number]  HPI 78 y/o WF here for a follow up visit...she has mult med problems as noted below... SEE PREV EPIC NOTES FOR EARLIER DATA >>  ~  February 23, 2012:  99mo ROV & Nichole Silva has had a good interval- no new complaints or concerns;  She has had some difficulty w/ her hearing aides & is working w/ [employer];  BP is controlled on her Procardia & Lasix, w/ persist 2-3+ edema/ VI changes- she knows to elim salt & wears support hose, weight is unchanged at 184-5#;  She denies CP, palpit, SOB, etc;  Lipids & DM management under good control w/ Simva80, Welchol2/d, & Metform500;  She credits the [facility] w/ helping her IBS as well- she is due for Colonoscopy & will call DrDBrodie to set this up...    We reviewed prob list, meds, xrays and labs> see below for updates >> OK 2013 Flu vaccine today...  ~  June 09, 2012:  47mo ROV & pre-op clearance requested by DrFernandez for possible vaginal prolapse surg in Feb;  She has a pessary now & GYN changes it every 82mo but she notes that she may need surg- she is incont & uses pads, she is very anxious & worries about infection, she denies pain, she notes bowel movements are OK w/ fiber supplements... We discussed checking CXR, EKG, & Fasting blood work... We reviewed the following medical problems during today's office visit>>     HOH> has hearing aides & doing satis...    AR & recurrent bronchitis> on Zyrtek, Flonase; she denies recent URI, bronchitic exac, cough/ phlegm, ch in SOB, edema, etc...    HBP> on Nifedipine60 & Lasix40- 1to2/d; BP= 142/64 & she denies CP, palpit, SOB, etc...    CAD> on ASA81; she is too sedentary but occas has to be on her feet for hrs at [employer] job; denies angina, etc...    VI> on low sodium, elevation, support hose & Lasix40 taking 1-2 daily; she has some chronic edema & dermatitis- Rx w/ topical steroid...    Chol> on Simva80 & Welchol2/d;  FLP 4/13 showed TChol 187, TG 113, HDL 55, LDL 110; FLP 1/14 shows TChol 161, TG 172, HDL 42, LDL 85    DM> on Metform500/d & diet; Labs 4/13 showed BS= 118, A1c=6.5; Labs 1/14 shows BS=137, A1c=6.4; Rec- continue same, better diet.    GI- IBS> on Bentyl10 prn; notes occas diarrhea & 1 immodium will usually do the trick; Colonoscopy 1/14 showed mod sigmoid divertics & rec for hi fiber diet...    GYN- Vag prolapse & followed by DrFernandez on Estradiol cream & pessary Rx but she may need surgery she says; she had an EColi UTI treated in Oct2013...    DJD> left knee pain since a fall & XRay w/ degen changes; she prefers just Tylenol prn & manages well...    VitD defic> on VitD 50K weekly; prev mammograms/ BMD/ etc- per DrFernandez; VitD level = 20 & rec to take 50K supplement every week.    Anxiety> on Xanax prn; incr stress w/ daugh etoh/drugs age 67 now doing better... We reviewed prob list, meds, xrays and labs> see below for updates >>  CXR 1/14 showed stable heart size, elev right hemidiaph, scarring right base w/o change, NAD.Marland KitchenMarland Kitchen EKG 1/14 showed NSR, rate70, NSSTTWA... LABS 1/14:  FLP- ok on Simva80+Welchol;  Chems- ok x BS=137,  A1c=6.4;  CBC- wnl;  TSH=3.05;  VitD=20 & rec to continue 50K wkly...   ~  December 07, 2012:  71mo ROV & Nichole Silva indicates a good 71mo interval- still works at [employer] part time, had colonoscopy & mammogram- updated below...     BP controlled on Procardia & Lasix, measures 130/74 today, denies CP/ palpit/ SOB, edema, etc...     Chol regulated on diet & Simva80 + Welchol 2/d; last FLP 1/14 showed TChol 161, TG 172, HDL 42, LDL 85; she is not fasting today...     BS controlled w/ diet & Metformin500/d; Labs 1/14 showed BS=137, A1c=6.4    She takes Bentyl for IBS, and uses Estradiol cream per Gyn DrFernandez, has pessary changed every 88mo for her cystocele & prolapse.     She is on VitD50K weekly and uses Xanaqx 0.5mg  prn for her stress/anxiety We reviewed prob list, meds, xrays and  labs> see below for updates >> Meds refilled per request...           Problem List:  DECREASED HEARING (ICD-389.9) - she has digital hearing aides bilat & is working thru [employer]...  ALLERGIC RHINITIS (ICD-477.9) - on ZYRTEK daily & FLONASE Qhs...  Hx of BRONCHITIS, RECURRENT (ICD-491.9) - she denies recent URIs or resp exac, takes [facility] as needed... ~  CXR 12/11 showed heart at upper lim of normal, lungs clear x mild peribronch thickening, NAD.Marland Kitchen. ~  CXR 1/14 showed stable heart size, elev right hemidiaph, scarring right base w/o change, NAD...  HYPERTENSION (ICD-401.9) - on PROCARDIA XL 60mg /d & LASIX 40mg  1-2 daily...  ~  CXR 4/11 showed mild basilar atelectasis, NAD.Marland Kitchen. ~  5/12:  We decided to incr LASIX to 40mg  Bid due to edema & recheck... ~  10/12:  BP stable 7 chr edema improved w/ Lasix40mg  taking 1-2 daily... ~  4/13:  BP=132/76 today, and feeling well... BP's at home are similar range... denies HA, fatigue, visual changes, CP, palipit, dizziness, syncope, dyspnea, edema, etc... ~  10/13:  BP= 130/78 & feeling well; denies CP, palpit, SOB, ch in edema, etc... ~  1/14: on Nifedipine60 & Lasix40- 1to2/d; BP= 142/64 & she denies CP, palpit, SOB, etc...  CORONARY ARTERY DISEASE (ICD-414.00) - on ASA 81mg /d... no CP, palpit, or change in SOB. ~  cath 1989 by DrStuckey showed non-obstructive dis w/ 40-50% LAD lesion, otherw norm... ~  EKG shows NSR, NSSTTWA (last 1/14 w/o change).  VENOUS INSUFFICIENCY (ICD-459.81) - she knows to elim sodium, elevate legs, wear support hose, & take the LASIX 40mg  ==> incr to Bid. ~  6/12:  Edema improved & wt down 3# on the Lasix 80mg /d; BUN=10, Creat=1.0, continue same dose... ~  4/13:  Edema stable & wt 185# on Lasix40- taking 1-2 tabs daily; BUN= 9, Creat= 0.9  HYPERLIPIDEMIA (ICD-272.4) - on SIMVASTATIN 80mg /d + WELCHOL 2/d... ~  FLP 9/08 on Vytorin 10-80 showed TChol 143, TG 100, HDL 44, LDL 79 ~  FLP 4/09 on Vytorin still showed TChol  153, TG 95, HDL 42, LDL 92... changed to Simva80 for $$. ~  FLP 10/09 still on the Vytorin showed TChol 166, TG 134, HDL 46, LDL 94 ~  FLP 4/10 on Simva80 showed TChol 203, TG 141, HDL 47, LDL 131... rec> same + diet! ~  FLP 10/10 on Simva80 showed TChol 176, TG 139, HDL 41, LDL 107 ~  FLP 4/11 showed TChol 213, TG 225, HDL 50, LDL 130... consider change med, ?add zetia, for now= BETTER DIET! ~  FLP 10/11 on Simva80+Welchol2 showed TChol 185, TG 232, HDL 53, LDL 103 ~  FLP 5/12 on Simva80+Welchol2 showed TChol 155, TG 111, HDL 43, LDL 90... Great job! ~  FLP 4/13 on Simva80+Welchol2 showed TChol 187, TG 113, HDL 55, LDL 110 ~  FLP 1/14 on Simva80+Welchol2 showed  TChol 161, TG 172, HDL 42, LDL 85   DIABETES MELLITUS (ICD-250.00) - on METFORMIN 500mg /d and diet... ~  labs 9/08 showed BS=121, HgA1c=6.3 ~  labs 4/09 showed BS= 111, HgA1c= 6.5.Marland KitchenMarland Kitchen continue same Rx. ~  labs 9/09 (wt=192#) showed BS= 131, HgA1c= 6.6.Marland Kitchen. same. ~  Labs 4/10 (wt=190#) showed BS= 122, A1c= 6.5 ~  labs 10/10 (wt=194#) showed BS= 120, A1c= 6.5 ~  labs 4/11 (wt=186#) showed BS= 124, A1c= 6.6 ~  labs 10/11 (wt=181#) showed BS= 115, A1c= 6.4 ~  Labs 5/12 (wt=182#) showed BS= 105, A1c= 6.8 ~  Labs 4/13 (wt=186#) showed BS= 110, A1c= 6.5 ~  Labs 1/14 (wt=183#) showed BS= 137, A1c= 6.4  IRRITABLE BOWEL SYNDROME (ICD-564.1) - she uses BENTYL 20mg  as needed... she tells me she wears depends for diarrhea prob. ~  last colonoscopy 2/02 by DrBrodie showed divertics only... f/u planned 23yrs. ~  4/11:  DrBrodie incr Bentyl to Tid & added WELCHOL 2tabs/d (diarrhea & incontinence improved) ~  4/13:  She is still taking the Welchol 2/d "it helps my IBS" she says... ~  1/14:  Colonoscopy 1/14 DrDBrodie showed mod sigmoid divertics & rec for hi fiber diet...  GYN >> Followed by DrFernandez w/ Vag Prolapse & his notes in EPIC are reviewed... ~  Mammogram 1/14 showed prob benign features (right breast is larger) and sonar + f/u in 55mo  is recommended...  ~  4/14: she had f/u DrFernandez & his EPIC note is reviewed> second degree cystocele & uterine prolapse w/o incont, Rx pessary.  DEGENERATIVE JOINT DISEASE (ICD-715.90) - on MOBIC 7.5mg  Prn...  VITAMIN D DEFICIENCY (ICD-268.9) - on Vit D 50000 u daily... ~  labs 4/10 showed Vit D level = 6... therefore started on Vit D 50000 u weekly. ~  labs 4/11 showed Vit D level = 38... OK to change to 2000 u daily. ~  Labs 5/12 showed Vit D level = 25... Change back to VitD 50K weekly. ~  Labs 4/13 showed Vit D level = 20... Asked to take VitD 50K every week! ~  Labs 1/14 showed Vit D level = 20... Reminded to take the 50K supplement every week...  ANXIETY (ICD-300.00) - on ALPRAZOLAM 0.5mg  Prn... she's under alot of stress w/ her husb and a 48 y/o nephew dx w/ Marfan's, s/p valve surg, needs heart transplant...  GYN = DrMcPhail w/ BMD 1/06 @ SER showing norm w/ TScores -0.3 to -0.8.Marland KitchenMarland Kitchen   Past Surgical History  Procedure Laterality Date  . Dilation and curettage of uterus  1960's  . Cholecystectomy  1997  . Left elbow surgery  1992  . Cataract surg  2009    Outpatient Encounter Prescriptions as of 12/07/2012  Medication Sig Dispense Refill  . acetaminophen (TYLENOL) 325 MG tablet Take 325 mg by mouth every 6 (six) hours as needed. Pain/headache      . ALPRAZolam (XANAX) 0.5 MG tablet TAKE 1 TABLET BY MOUTH THREE TIMES DAILY AS NEEDED  90 tablet  2  . aspirin 81 MG tablet Take 81 mg by mouth daily.        . cetirizine (ZYRTEC) 10 MG tablet Take 10 mg by mouth daily.        Marland Kitchen  dicyclomine (BENTYL) 10 MG capsule TAKE ONE CAPSULE BY MOUTH THREE TIMES DAILY BEFORE MEALS  90 capsule  5  . ergocalciferol (VITAMIN D2) 50000 UNITS capsule Take 50,000 Units by mouth every Monday.       . fluticasone (FLONASE) 50 MCG/ACT nasal spray SPRAY TWICE IN EACH NOSTRIL EVERY NIGHT AT BEDTIME  16 g  6  . furosemide (LASIX) 40 MG tablet TAKE 2 TABLET BY MOUTH EVERY DAY  60 tablet  5  . metFORMIN  (GLUCOPHAGE) 500 MG tablet TAKE 1 TABLET BY MOUTH EVERY DAY  30 tablet  PRN  . NIFEDICAL XL 60 MG 24 hr tablet TAKE 1 TABLET BY MOUTH EVERY DAY  30 tablet  0  . NONFORMULARY OR COMPOUNDED ITEM Estradiol .02% 1 ML Prefilled Applicator Sig: apply vaginally twice a week #90 Day Supply with 4 refills  1 each  4  . simvastatin (ZOCOR) 80 MG tablet TAKE 1 TABLET BY MOUTH AT BEDTIME  30 tablet  PRN  . WELCHOL 625 MG tablet TAKE 2 TABLETS BY MOUTH EVERY DAY  60 tablet  5  . [DISCONTINUED] fluticasone (FLONASE) 50 MCG/ACT nasal spray Place 2 sprays into the nose daily as needed. allergies      . [DISCONTINUED] metFORMIN (GLUCOPHAGE) 500 MG tablet Take 500 mg by mouth daily.      . [DISCONTINUED] NIFEdipine (PROCARDIA XL/ADALAT-CC) 60 MG 24 hr tablet Take 60 mg by mouth daily.      . [DISCONTINUED] simvastatin (ZOCOR) 80 MG tablet Take 80 mg by mouth at bedtime.       No facility-administered encounter medications on file as of 12/07/2012.    Allergies  Allergen Reactions  . Penicillins     REACTION: ITCHING AND SWELLING    Current Medications, Allergies, Past Medical History, Past Surgical History, Family History, and Social History were reviewed in [employer] record.   Review of Systems        See HPI - all other systems neg except as noted... The patient complains of decreased hearing and dyspnea on exertion.  The patient denies anorexia, fever, weight loss, weight gain, vision loss, hoarseness, chest pain, syncope, peripheral edema, prolonged cough, headaches, hemoptysis, abdominal pain, melena, hematochezia, severe indigestion/heartburn, hematuria, incontinence, muscle weakness, suspicious skin lesions, transient blindness, difficulty walking, depression, unusual weight change, abnormal bleeding, enlarged lymph nodes, and angioedema.     Objective:   Physical Exam     WD, Overweight, 77 y/o WF in NAD... Vital Signs:  Reviewed... GENERAL:  Alert & oriented;  pleasant & cooperative... HEENT:  [place]/AT, EOM-wnl, PERRLA, EACs-clear, TMs-wnl, NOSE-clear, THROAT-clear & wnl. NECK:  Supple w/ fairROM; no JVD; normal carotid impulses w/o bruits; no thyromegaly or nodules palpated; no lymphadenopathy. CHEST:  Clear to P & A; without wheezes/ rales/ or rhonchi. HEART:  Regular Rhythm; without murmurs/ rubs/ or gallops. ABDOMEN:  Soft & nontender; normal bowel sounds; no organomegaly or masses detected. EXT: without deformities, mild arthritic changes; no varicose veins/ +venous insuffic/ 1+edema (chronic) NEURO:  CN's intact;  no focal neuro deficits... DERM:  chronic dry skin dermatitis on legs...  RADIOLOGY DATA:  Reviewed in the EPIC EMR & discussed w/ the patient...  LABORATORY DATA:  Reviewed in the EPIC EMR & discussed w/ the patient...   Assessment & Plan:    HBP>  Controlled on CCB & Diuretic; continue same...  CAD>  Denies angina etc but she is too sedentary; rec incr exercise, continue ASA, continue BP meds, statin, DM rx etc..Marland Kitchen  VI/ Edema/ Dermatitis>  Improved w/ Lasix40 taking 1-2 daily & BMet looks OK- continue same; dermatitis much improved w/ the HC ointment...  LIPIDS>  Stable on Simva, continue same for now> needs better diet, get wt down...  DM>  Stable on the Metformin, needs better diet, get wt down...  Vit D defic>  rec to return to Vit D Rx 50K weekly...   Patient's Medications  New Prescriptions   No medications on file  Previous Medications   ACETAMINOPHEN (TYLENOL) 325 MG TABLET    Take 325 mg by mouth every 6 (six) hours as needed. Pain/headache   ASPIRIN 81 MG TABLET    Take 81 mg by mouth daily.     CETIRIZINE (ZYRTEC) 10 MG TABLET    Take 10 mg by mouth daily.     DICYCLOMINE (BENTYL) 10 MG CAPSULE    TAKE ONE CAPSULE BY MOUTH THREE TIMES DAILY BEFORE MEALS   ERGOCALCIFEROL (VITAMIN D2) 50000 UNITS CAPSULE    Take 50,000 Units by mouth every Monday.    FLUTICASONE (FLONASE) 50 MCG/ACT NASAL SPRAY    SPRAY TWICE  IN EACH NOSTRIL EVERY NIGHT AT BEDTIME   FUROSEMIDE (LASIX) 40 MG TABLET    TAKE 2 TABLET BY MOUTH EVERY DAY   METFORMIN (GLUCOPHAGE) 500 MG TABLET    TAKE 1 TABLET BY MOUTH EVERY DAY   NONFORMULARY OR COMPOUNDED ITEM    Estradiol .02% 1 ML Prefilled Applicator Sig: apply vaginally twice a week #90 Day Supply with 4 refills   SIMVASTATIN (ZOCOR) 80 MG TABLET    TAKE 1 TABLET BY MOUTH AT BEDTIME   WELCHOL 625 MG TABLET    TAKE 2 TABLETS BY MOUTH EVERY DAY  Modified Medications   Modified Medication Previous Medication   ALPRAZOLAM (XANAX) 0.5 MG TABLET ALPRAZolam (XANAX) 0.5 MG tablet      TAKE 1/2 to 1  TABLET BY MOUTH THREE TIMES DAILY AS NEEDED    TAKE 1 TABLET BY MOUTH THREE TIMES DAILY AS NEEDED   NIFEDIPINE (NIFEDICAL XL) 60 MG 24 HR TABLET NIFEDICAL XL 60 MG 24 hr tablet      TAKE 1 TABLET BY MOUTH EVERY DAY    TAKE 1 TABLET BY MOUTH EVERY DAY  Discontinued Medications   FLUTICASONE (FLONASE) 50 MCG/ACT NASAL SPRAY    Place 2 sprays into the nose daily as needed. allergies   METFORMIN (GLUCOPHAGE) 500 MG TABLET    Take 500 mg by mouth daily.   NIFEDIPINE (PROCARDIA XL/ADALAT-CC) 60 MG 24 HR TABLET    Take 60 mg by mouth daily.   SIMVASTATIN (ZOCOR) 80 MG TABLET    Take 80 mg by mouth at bedtime.

## 2012-12-07 NOTE — Patient Instructions (Addendum)
Today we updated your med list in our EPIC system...    Continue your current medications the same...  Call for any questions...  Let's plan a follow up visit in 6mo, sooner if needed for problems...   

## 2012-12-12 ENCOUNTER — Telehealth: Payer: Self-pay | Admitting: Pulmonary Disease

## 2012-12-12 ENCOUNTER — Other Ambulatory Visit: Payer: Self-pay | Admitting: Pulmonary Disease

## 2012-12-12 MED ORDER — NIFEDIPINE ER OSMOTIC RELEASE 60 MG PO TB24: ORAL_TABLET | ORAL | Status: DC

## 2012-12-12 MED ORDER — ALPRAZOLAM 0.5 MG PO TABS: ORAL_TABLET | ORAL | Status: DC

## 2012-12-12 NOTE — Telephone Encounter (Signed)
rx have been called to the pharmacy per pts daughter request. i have called and lmom  To make them aware that these meds have been called to the pharmacy

## 2012-12-20 ENCOUNTER — Ambulatory Visit (INDEPENDENT_AMBULATORY_CARE_PROVIDER_SITE_OTHER): Payer: Medicare Other | Admitting: Gynecology

## 2012-12-20 ENCOUNTER — Encounter: Payer: Self-pay | Admitting: Gynecology

## 2012-12-20 DIAGNOSIS — Z4689 Encounter for fitting and adjustment of other specified devices: Secondary | ICD-10-CM

## 2012-12-20 DIAGNOSIS — N819 Female genital prolapse, unspecified: Secondary | ICD-10-CM

## 2012-12-20 NOTE — Progress Notes (Signed)
78 year old patient with history of second-degree cystocele first-degree uterine prolapse with no stress incontinence was placed on a 2-1/2 inch donut pessary and patient has done well with no complaints of bloating sensation. She had been applying the Estrace vaginal cream twice a week. She also been doing Kegel exercises. Patient is adamant on surgical correction and is happy with the ring pessary.  The pessary was removed cleaned and placed back into the vagina with estrogen cream. Prior to reinsertion of the pessary the vaginal epithelium was inspected there was no evidence of infection or erosion. Patient returned back to the office before dinner the year for her annual gynecological exam.

## 2012-12-23 ENCOUNTER — Ambulatory Visit
Admission: RE | Admit: 2012-12-23 | Discharge: 2012-12-23 | Disposition: A | Discharge status: Home / Self Care | Payer: Medicare Other | Source: Ambulatory Visit | Attending: Pulmonary Disease | Admitting: Pulmonary Disease

## 2012-12-23 DIAGNOSIS — N649 Disorder of breast, unspecified: Secondary | ICD-10-CM

## 2012-12-23 IMAGING — MG MM DIANOSTIC UNILATERAL R
2 series · 2 of 2 positions shown · non-contrast
Comparison: Prior mammogram and ultrasound from 06/08/2012.

CLINICAL DATA: 78-year-old female returns for a 6-month follow-up
diagnostic right mammogram and ultrasound for a probably benign
mass, favored to represent an intramammary lymph node.

DIGITAL DIAGNOSTIC RIGHT MAMMOGRAM  AND RIGHT BREAST ULTRASOUND:

[R MLO]
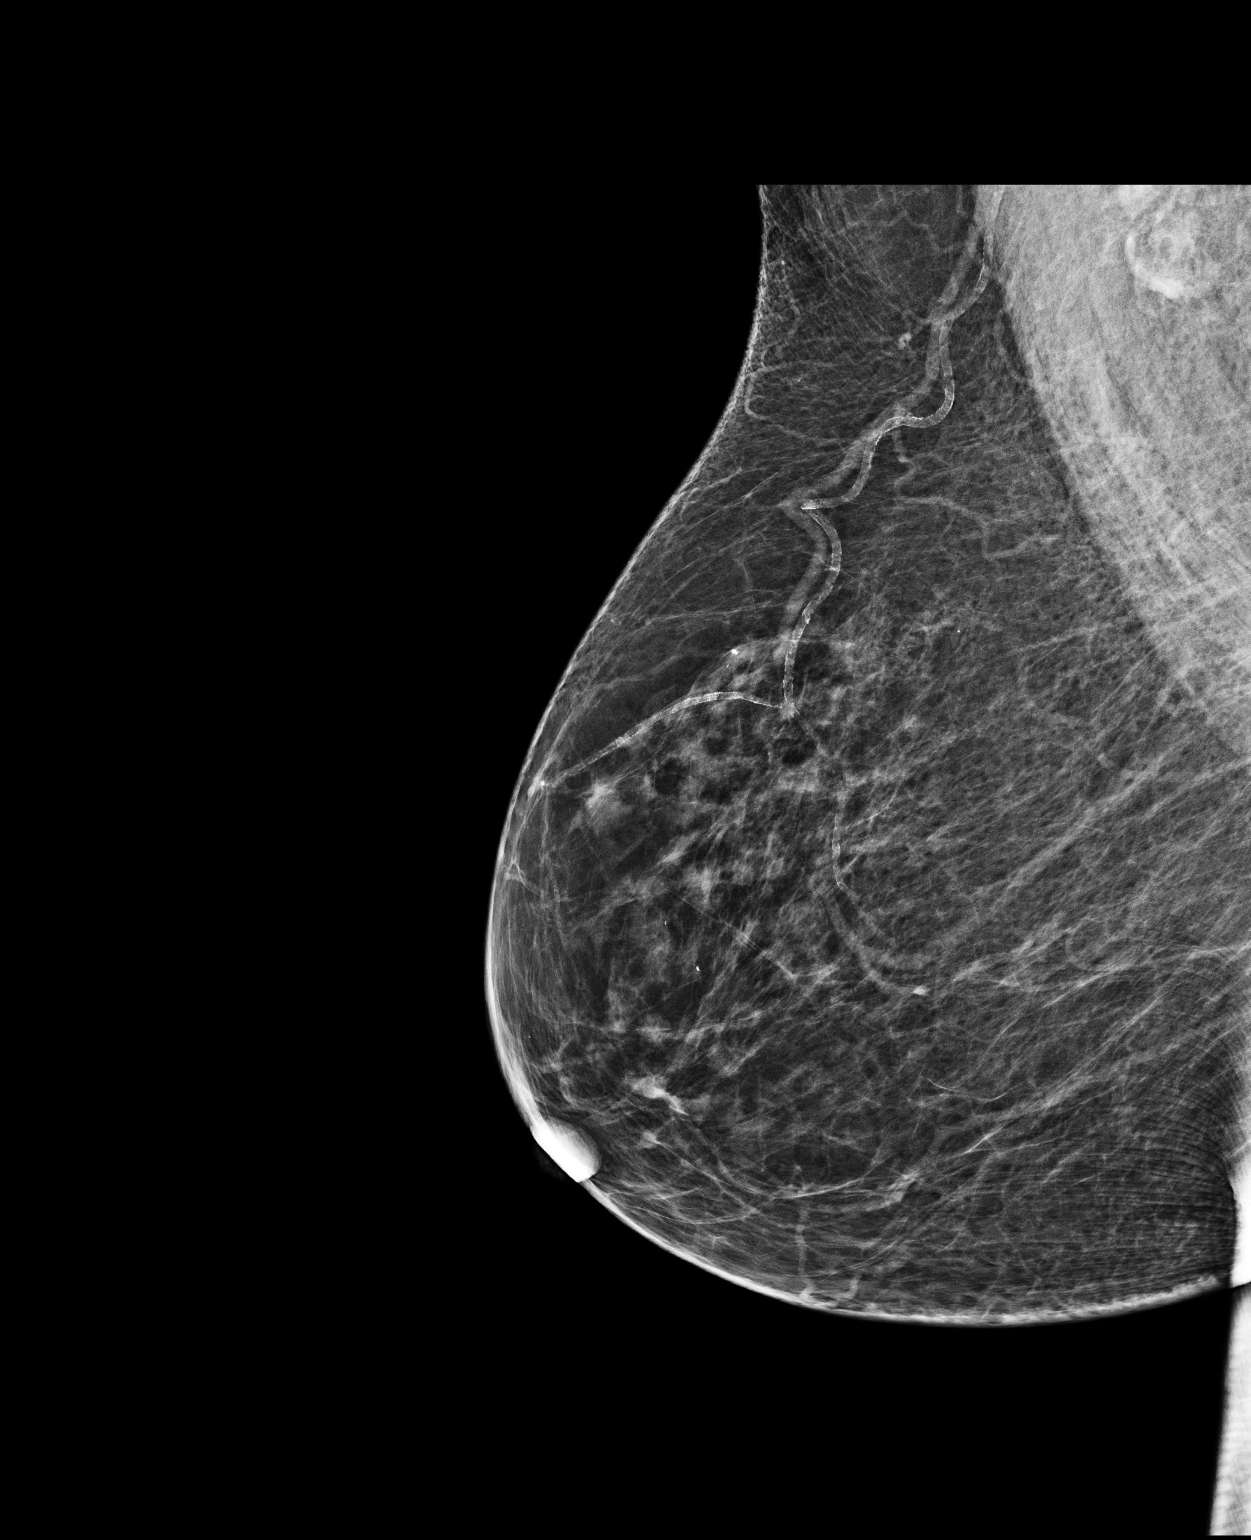

[R CC]
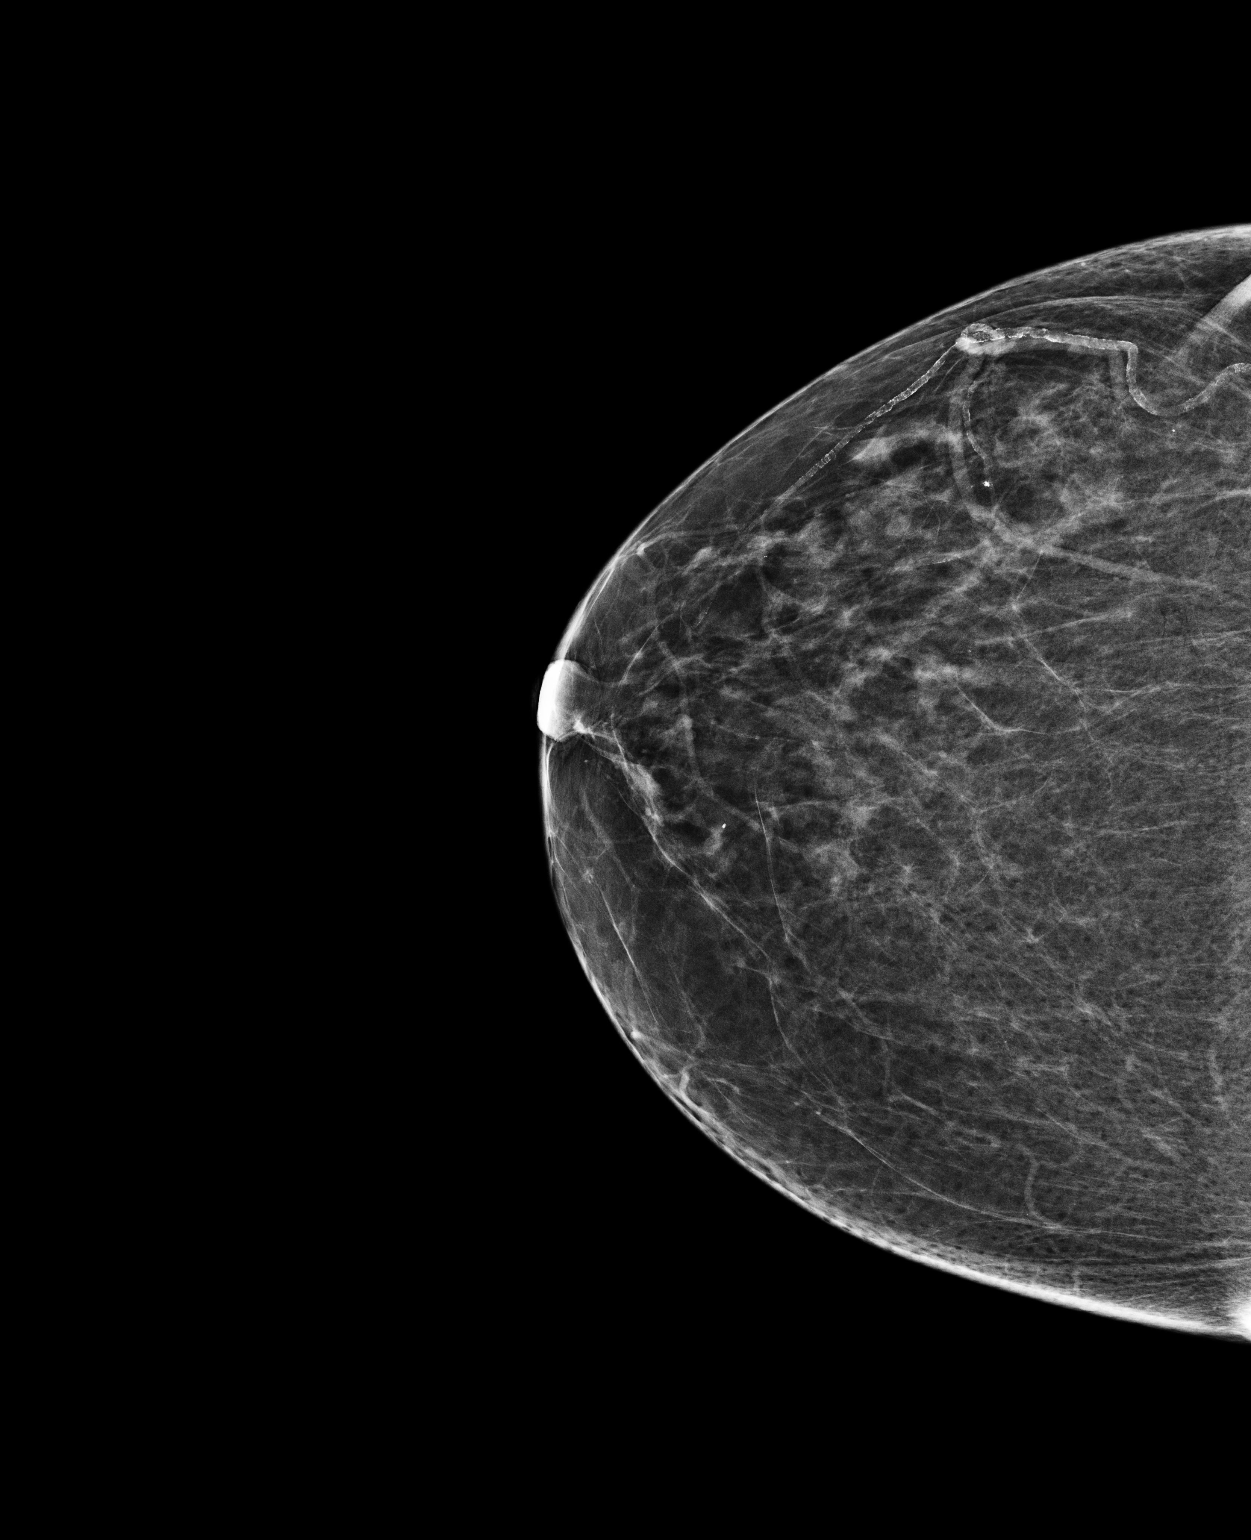

[2 of 2 positions shown; findings below may reference images not displayed]

FINDINGS: ACR Breast Density Category b:  There are scattered areas of
fibroglandular density.

No suspicious masses or calcifications are seen in the right
breast.  There is no mammographic evidence of malignancy in the
right breast.

Mammographic images were processed with CAD.

Physical examination of the upper outer right breast does not
reveal any discrete abnormalities.

Targeted ultrasound of the upper outer right breast was performed.
No discrete masses or abnormalities seen, only normal-appearing
fibroglandular tissue.  This area was scanned from the approximate
8-11 o'clock position.
IMPRESSION: No mammographic evidence of malignancy in the right breast with
previously seen mass favored to represent a benign intramammary
lymph node at 10 o'clock in the right breast not seen on today's
ultrasound.

RECOMMENDATION:
Recommend return to annual screening given no mammographic or
sonographic abnormalities were seen in the right breast on today's
exam.

I have discussed the findings and recommendations with the patient.
Results were also provided in writing at the conclusion of the
visit.  If applicable, a reminder letter will be sent to the
patient regarding the next appointment.

BI-RADS CATEGORY 1:  Negative.

## 2013-02-08 LAB — HM DIABETES EYE EXAM

## 2013-03-27 ENCOUNTER — Encounter: Payer: Medicare Other | Admitting: Gynecology

## 2013-03-29 ENCOUNTER — Encounter: Payer: Self-pay | Admitting: Gynecology

## 2013-03-29 ENCOUNTER — Ambulatory Visit (INDEPENDENT_AMBULATORY_CARE_PROVIDER_SITE_OTHER): Payer: Medicare Other | Admitting: Gynecology

## 2013-03-29 VITALS — BP 146/88 | Ht 63.5 in | Wt 186.0 lb

## 2013-03-29 DIAGNOSIS — N8111 Cystocele, midline: Secondary | ICD-10-CM

## 2013-03-29 DIAGNOSIS — Z4689 Encounter for fitting and adjustment of other specified devices: Secondary | ICD-10-CM

## 2013-03-29 DIAGNOSIS — N819 Female genital prolapse, unspecified: Secondary | ICD-10-CM

## 2013-03-29 DIAGNOSIS — N952 Postmenopausal atrophic vaginitis: Secondary | ICD-10-CM

## 2013-03-29 DIAGNOSIS — Z780 Asymptomatic menopausal state: Secondary | ICD-10-CM

## 2013-03-29 DIAGNOSIS — Z23 Encounter for immunization: Secondary | ICD-10-CM

## 2013-03-29 DIAGNOSIS — Z8639 Personal history of other endocrine, nutritional and metabolic disease: Secondary | ICD-10-CM

## 2013-03-29 MED ORDER — NONFORMULARY OR COMPOUNDED ITEM: Status: DC

## 2013-03-29 NOTE — Patient Instructions (Addendum)
Shingles Vaccine What You Need to Know WHAT IS SHINGLES?  Shingles is a painful skin rash, often with blisters. It is also called Herpes Zoster or just Zoster.  A shingles rash usually appears on one side of the face or body and lasts from 2 to 4 weeks. Its main symptom is pain, which can be quite severe. Other symptoms of shingles can include fever, headache, chills, and upset stomach. Very rarely, a shingles infection can lead to pneumonia, hearing problems, blindness, brain inflammation (encephalitis), or death.  For about 1 person in 5, severe pain can continue even after the rash clears up. This is called post-herpetic neuralgia.  Shingles is caused by the Varicella Zoster virus. This is the same virus that causes chickenpox. Only someone who has had a case of chickenpox or rarely, has gotten chickenpox vaccine, can get shingles. The virus stays in your body. It can reappear many years later to cause a case of shingles.  You cannot catch shingles from another person with shingles. However, a person who has never had chickenpox (or chickenpox vaccine) could get chickenpox from someone with shingles. This is not very common.  Shingles is far more common in people 50 and older than in younger people. It is also more common in people whose immune systems are weakened because of a disease such as cancer or drugs such as steroids or chemotherapy.  At least 1 million people get shingles per year in the [place]. SHINGLES VACCINE  A vaccine for shingles was licensed in 2006. In clinical trials, the vaccine reduced the risk of shingles by 50%. It can also reduce the pain in people who still get shingles after being vaccinated.  A single dose of shingles vaccine is recommended for adults 60 years of age and older. SOME PEOPLE SHOULD NOT GET SHINGLES VACCINE OR SHOULD WAIT A person should not get shingles vaccine if he or she:  Has ever had a life-threatening allergic reaction to gelatin, the  antibiotic neomycin, or any other component of shingles vaccine. Tell your caregiver if you have any severe allergies.  Has a weakened immune system because of current:  AIDS or another disease that affects the immune system.  Treatment with drugs that affect the immune system, such as prolonged use of high-dose steroids.  Cancer treatment, such as radiation or chemotherapy.  Cancer affecting the bone marrow or lymphatic system, such as leukemia or lymphoma.  Is pregnant, or might be pregnant. Women should not become pregnant until at least 4 weeks after getting shingles vaccine. Someone with a minor illness, such as a cold, may be vaccinated. Anyone with a moderate or severe acute illness should usually wait until he or she recovers before getting the vaccine. This includes anyone with a temperature of 101.3 F (38 C) or higher. WHAT ARE THE RISKS FROM SHINGLES VACCINE?  A vaccine, like any medicine, could possibly cause serious problems, such as severe allergic reactions. However, the risk of a vaccine causing serious harm, or death, is extremely small.  No serious problems have been identified with shingles vaccine. Mild Problems  Redness, soreness, swelling, or itching at the site of the injection (about 1 person in 3).  Headache (about 1 person in 70). Like all vaccines, shingles vaccine is being closely monitored for unusual or severe problems. WHAT IF THERE IS A MODERATE OR SEVERE REACTION? What should I look for? Any unusual condition, such as a severe allergic reaction or a high fever. If a severe allergic reaction   occurred, it would be within a few minutes to an hour after the shot. Signs of a serious allergic reaction can include difficulty breathing, weakness, hoarseness or wheezing, a fast heartbeat, hives, dizziness, paleness, or swelling of the throat. What should I do?  Call your caregiver, or get the person to a caregiver right away.  Tell the caregiver what  happened, the date and time it happened, and when the vaccination was given.  Ask the caregiver to report the reaction by filing a Vaccine Adverse Event Reporting System (VAERS) form. Or, you can file this report through the VAERS web site at www.vaers.LAgents.no or by calling 1-878-057-5468. VAERS does not provide medical advice. HOW CAN I LEARN MORE?  Ask your caregiver. He or she can give you the vaccine package insert or suggest other sources of information.  Contact the Centers for Disease Control and Prevention (CDC):  Call 857 613 3372 (1-800-CDC-INFO).  Visit the CDC website at PicCapture.uy CDC Shingles Vaccine VIS (02/28/08) Document Released: 03/08/2006 Document Revised: 08/03/2011 Document Reviewed: 08/31/2012 [facility] Patient Information 2014 [place], [place]. Influenza Vaccine (Flu Vaccine, Inactivated) 2013 2014 What You Need to Know WHY GET VACCINATED?  Influenza ("flu") is a contagious disease that spreads around the [place] every winter, usually between October and May.  Flu is caused by the influenza virus, and can be spread by coughing, sneezing, and close contact.  Anyone can get flu, but the risk of getting flu is highest among children. Symptoms come on suddenly and may last several days. They can include:  Fever or chills.  Sore throat.  Muscle aches.  Fatigue.  Cough.  Headache.  Runny or stuffy nose. Flu can make some people much sicker than others. These people include young children, people 75 and older, pregnant women, and people with certain health conditions such as heart, lung or kidney disease, or a weakened immune system. Flu vaccine is especially important for these people, and anyone in close contact with them. Flu can also lead to pneumonia, and make existing medical conditions worse. It can cause diarrhea and seizures in children. Each year thousands of people in the [place] die from flu, and many more are  hospitalized. Flu vaccine is the best protection we have from flu and its complications. Flu vaccine also helps prevent spreading flu from person to person. INACTIVATED FLU VACCINE There are 2 types of influenza vaccine:  You are getting an inactivated flu vaccine, which does not contain any live influenza virus. It is given by injection with a needle, and often called the "flu shot."  A different live, attenuated (weakened) influenza vaccine is sprayed into the nostrils. This vaccine is described in a separate Vaccine Information Statement. Flu vaccine is recommended every year. Children 6 months through 64 years of age should get 2 doses the first year they get vaccinated. Flu viruses are always changing. Each year's flu vaccine is made to protect from viruses that are most likely to cause disease that year. While flu vaccine cannot prevent all cases of flu, it is our best defense against the disease. Inactivated flu vaccine protects against 3 or 4 different influenza viruses. It takes about 2 weeks for protection to develop after the vaccination, and protection lasts several months to a year. Some illnesses that are not caused by influenza virus are often mistaken for flu. Flu vaccine will not prevent these illnesses. It can only prevent influenza. A "high-dose" flu vaccine is available for people 22 years of age and older. The person giving  you the vaccine can tell you more about it. Some inactivated flu vaccine contains a very small amount of a mercury-based preservative called thimerosal. Studies have shown that thimerosal in vaccines is not harmful, but flu vaccines that do not contain a preservative are available. SOME PEOPLE SHOULD NOT GET THIS VACCINE Tell the person who gives you the vaccine:  If you have any severe (life-threatening) allergies. If you ever had a life-threatening allergic reaction after a dose of flu vaccine, or have a severe allergy to any part of this vaccine, you may be  advised not to get a dose. Most, but not all, types of flu vaccine contain a small amount of egg.  If you ever had Guillain Barr Syndrome (a severe paralyzing illness, also called GBS). Some people with a history of GBS should not get this vaccine. This should be discussed with your doctor.  If you are not feeling well. They might suggest waiting until you feel better. But you should come back. RISKS OF A VACCINE REACTION With a vaccine, like any medicine, there is a chance of side effects. These are usually mild and go away on their own. Serious side effects are also possible, but are very rare. Inactivated flu vaccine does not contain live flu virus, sogetting flu from this vaccine is not possible. Brief fainting spells and related symptoms (such as jerking movements) can happen after any medical procedure, including vaccination. Sitting or lying down for about 15 minutes after a vaccination can help prevent fainting and injuries caused by falls. Tell your doctor if you feel dizzy or lightheaded, or have vision changes or ringing in the ears. Mild problems following inactivated flu vaccine:  Soreness, redness, or swelling where the shot was given.  Hoarseness; sore, red or itchy eyes; or cough.  Fever.  Aches.  Headache.  Itching.  Fatigue. If these problems occur, they usually begin soon after the shot and last 1 or 2 days. Moderate problems following inactivated flu vaccine:  Young children who get inactivated flu vaccine and pneumococcal vaccine (PCV13) at the same time may be at increased risk for seizures caused by fever. Ask your doctor for more information. Tell your doctor if a child who is getting flu vaccine has ever had a seizure. Severe problems following inactivated flu vaccine:  A severe allergic reaction could occur after any vaccine (estimated less than 1 in a million doses).  There is a small possibility that inactivated flu vaccine could be associated with Guillan  Barr Syndrome (GBS), no more than 1 or 2 cases per million people vaccinated. This is much lower than the risk of severe complications from flu, which can be prevented by flu vaccine. The safety of vaccines is always being monitored. For more information, visit: http://floyd.org/ WHAT IF THERE IS A SERIOUS REACTION? What should I look for?  Look for anything that concerns you, such as signs of a severe allergic reaction, very high fever, or behavior changes. Signs of a severe allergic reaction can include hives, swelling of the face and throat, difficulty breathing, a fast heartbeat, dizziness, and weakness. These would start a few minutes to a few hours after the vaccination. What should I do?  If you think it is a severe allergic reaction or other emergency that cannot wait, call [phone] 1 1  or get the person to the nearest hospital. Otherwise, call your doctor.  Afterward, the reaction should be reported to the Vaccine Adverse Event Reporting System (VAERS). Your doctor might file this report,  or you can do it yourself through the VAERS website at www.vaers.LAgents.no, or by calling 1-870-502-2615. VAERS is only for reporting reactions. They do not give medical advice. THE NATIONAL VACCINE INJURY COMPENSATION PROGRAM The National Vaccine Injury Compensation Program (VICP) is a federal program that was created to compensate people who may have been injured by certain vaccines. Persons who believe they may have been injured by a vaccine can learn about the program and about filing a claim by calling 1-(562)417-9164 or visiting the VICP website at SpiritualWord.at HOW CAN I LEARN MORE?  Ask your doctor.  Call your local or state health department.  Contact the Centers for Disease Control and Prevention (CDC):  Call (878)756-5694 (1-800-CDC-INFO) or  Visit CDC's website at BiotechRoom.com.cy CDC Inactivated Influenza Vaccine Interim VIS (12/18/11) Document Released: 03/05/2006  Document Revised: 02/03/2012 Document Reviewed: 01/12/2012 [facility] Patient Information 2014 [place], [place]. Bone Densitometry Bone densitometry is a special X-ray that measures your bone density and can be used to help predict your risk of bone fractures. This test is used to determine bone mineral content and density to diagnose osteoporosis. Osteoporosis is the loss of bone that may cause the bone to become weak. Osteoporosis commonly occurs in women entering menopause. However, it may be found in men and in people with other diseases. PREPARATION FOR TEST No preparation necessary. WHO SHOULD BE TESTED?  All women older than 23.  Postmenopausal women (50 to 2) with risk factors for osteoporosis.  People with a previous fracture caused by normal activities.  People with a small body frame (less than 127 poundsor a body mass index [BMI] of less than 21).  People who have a parent with a hip fracture or history of osteoporosis.  People who smoke.  People who have rheumatoid arthritis.  Anyone who engages in excessive alcohol use (more than 3 drinks most days).  Women who experience early menopause. WHEN SHOULD YOU BE RETESTED? Current guidelines suggest that you should wait at least 2 years before doing a bone density test again if your first test was normal.Recent studies indicated that women with normal bone density may be able to wait a few years before needing to repeat a bone density test. You should discuss this with your caregiver.  NORMAL FINDINGS   Normal: less than standard deviation below normal (greater than -1).  Osteopenia: 1 to 2.5 standard deviations below normal (-1 to -2.5).  Osteoporosis: greater than 2.5 standard deviations below normal (less than -2.5). Test results are reported as a "T score" and a "Z score."The T score is a number that compares your bone density with the bone density of healthy, young women.The Z score is a number that compares your  bone density with the scores of women who are the same age, gender, and race.  Ranges for normal findings may vary among different laboratories and hospitals. You should always check with your doctor after having lab work or other tests done to discuss the meaning of your test results and whether your values are considered within normal limits. MEANING OF TEST  Your caregiver will go over the test results with you and discuss the importance and meaning of your results, as well as treatment options and the need for additional tests if necessary. OBTAINING THE TEST RESULTS It is your responsibility to obtain your test results. Ask the lab or department performing the test when and how you will get your results. Document Released: 06/02/2004 Document Revised: 08/03/2011 Document Reviewed: 06/25/2010 [employer] Patient Information 2014  [employer].

## 2013-03-29 NOTE — Addendum Note (Signed)
Addended by: Bertram Savin A on: 03/29/2013 08:45 AM   Modules accepted: Orders

## 2013-03-29 NOTE — Progress Notes (Signed)
Nichole Silva November 28, 1934 [id number]   History:    78 y.o.  for GYN followup as well as pessary maintenance.patient has a history of second-degree cystocele first-degree uterine prolapse with no stress incontinence was placed on a 2-1/2 inch donut pessary and patient has done well with no complaints of bloating sensation. She had been applying the Estrace vaginal cream twice a week. She also been doing Kegel exercises. Patient is adamant on surgical correction and is happy with the ring pessary patient's primary physician is Dr. Kriste Basque who has been treating her for hypertension, hyperlipidemia, venous insufficiency and vitamin D deficiency. Please see problem list for additional details. He has been drawn her blood work. Patient would like to have the flu vaccine today. Patient is not receive the shingles vaccine yet. Patient states her bone density study was over 3-4 years ago. We cannot find any report from previous study in our system. She is currently taking vitamin D 50,000 units monthly for vitamin D deficiency. Last use vitamin D level at her PCP office was low with a value of 20 when he had initiated treatment. Patient's colonoscopy was in January of this year which patient reports was normal. No prior history of abnormal colonoscopies or mammogram. Last mammogram or cord to be August of this year. No past history of cervical dysplasia.   Past medical history,surgical history, family history and social history were all reviewed and documented in the EPIC chart.  Gynecologic History No LMP recorded. Patient is postmenopausal. Contraception: post menopausal status Last Pap: over 5 years ago. Results were: normal Last mammogram: August 2014. Results were: normal  Obstetric History OB History  Gravida Para Term Preterm AB SAB TAB Ectopic Multiple Living  6 4 3 1 2 2    4     # Outcome Date GA Lbr Len/2nd Weight Sex Delivery Anes PTL Lv  6 SAB           5 SAB           4 TRM     F SVD  N Y  3 TRM      F SVD  N Y  2 TRM     M SVD  N Y     Comments: DECEASED  1 PRE     F SVD  Y Y       ROS: A ROS was performed and pertinent positives and negatives are included in the history.  GENERAL: No fevers or chills. HEENT: No change in vision, no earache, sore throat or sinus congestion. NECK: No pain or stiffness. CARDIOVASCULAR: No chest pain or pressure. No palpitations. PULMONARY: No shortness of breath, cough or wheeze. GASTROINTESTINAL: No abdominal pain, nausea, vomiting or diarrhea, melena or bright red blood per rectum. GENITOURINARY: No urinary frequency, urgency, hesitancy or dysuria. MUSCULOSKELETAL: No joint or muscle pain, no back pain, no recent trauma. DERMATOLOGIC: No rash, no itching, no lesions. ENDOCRINE: No polyuria, polydipsia, no heat or cold intolerance. No recent change in weight. HEMATOLOGICAL: No anemia or easy bruising or bleeding. NEUROLOGIC: No headache, seizures, numbness, tingling or weakness. PSYCHIATRIC: No depression, no loss of interest in normal activity or change in sleep pattern.     Exam: chaperone present  BP 146/88  Ht 5' 3.5" (1.613 m)  Wt 186 lb (84.369 kg)  BMI 32.43 kg/m2  Body mass index is 32.43 kg/(m^2).  General appearance : Well developed well nourished female. No acute distress HEENT: Neck supple, trachea midline, no carotid bruits, no thyroidmegaly Lungs:  Clear to auscultation, no rhonchi or wheezes, or rib retractions  Heart: Regular rate and rhythm, no murmurs or gallops Breast:Examined in sitting and supine position were symmetrical in appearance, no palpable masses or tenderness,  no skin retraction, no nipple inversion, no nipple discharge, no skin discoloration, no axillary or supraclavicular lymphadenopathy Abdomen: no palpable masses or tenderness, no rebound or guarding Extremities: no edema or skin discoloration or tenderness  Pelvic: Bartholin urethra Skene: Atrophic changes  Vagina: First to second-degree cystocele with  first-degree uterine prolapse, no rectocele ,no cervical lesion. Bimanual: Axial small normal size shape and consistency  Adnexa: No palpable masses or tenderness  Rectal exam deferred      Assessment/Plan:  78 y.o. female with history of second-degree cystocele and first-degree uterine prolapse with no stress urinary incontinence doing well with 2-1/2 inches doughnut pessary which was clean today. Patient will continue to return to the office every 3-4 months for clean. She'll continue to use the vaginal estrogen twice a week for vaginal atrophy. She will be scheduled for bone density study here in the office in next 2 weeks for which Dr. Kriste Basque will be able to have access to care we will hold off on any lab works and she scored to see in the next couple months. She did receive a flu vaccine today. Prescription was given to her for the shingles vaccine. Her husband has had history of shingles. Literature information was provided.   Note: This dictation was prepared with  Dragon/digital dictation along withSmart phrase technology. Any transcriptional errors that result from this process are unintentional.   Ok Edwards MD, 8:32 AM 03/29/2013

## 2013-05-10 ENCOUNTER — Telehealth: Payer: Self-pay | Admitting: Pulmonary Disease

## 2013-05-10 NOTE — Telephone Encounter (Signed)
lmtcb X1 

## 2013-05-10 NOTE — Telephone Encounter (Signed)
LMTCB X1 

## 2013-05-11 NOTE — Telephone Encounter (Signed)
LMTCB on [phone], other contact number is incorrect. Carron Curie, CMA

## 2013-05-12 NOTE — Telephone Encounter (Signed)
ATC x 4 will sign off message as this is 4th attempt per triage protocol

## 2013-05-14 ENCOUNTER — Encounter (HOSPITAL_COMMUNITY): Payer: Self-pay | Admitting: Emergency Medicine

## 2013-05-14 DIAGNOSIS — F329 Major depressive disorder, single episode, unspecified: Secondary | ICD-10-CM | POA: Insufficient documentation

## 2013-05-14 DIAGNOSIS — J45901 Unspecified asthma with (acute) exacerbation: Secondary | ICD-10-CM | POA: Insufficient documentation

## 2013-05-14 DIAGNOSIS — M1990 Unspecified osteoarthritis, unspecified site: Secondary | ICD-10-CM | POA: Insufficient documentation

## 2013-05-14 DIAGNOSIS — E119 Type 2 diabetes mellitus without complications: Secondary | ICD-10-CM | POA: Insufficient documentation

## 2013-05-14 DIAGNOSIS — I10 Essential (primary) hypertension: Secondary | ICD-10-CM | POA: Insufficient documentation

## 2013-05-14 DIAGNOSIS — IMO0002 Reserved for concepts with insufficient information to code with codable children: Secondary | ICD-10-CM | POA: Insufficient documentation

## 2013-05-14 DIAGNOSIS — Z880 Allergy status to penicillin: Secondary | ICD-10-CM | POA: Insufficient documentation

## 2013-05-14 DIAGNOSIS — I2510 Atherosclerotic heart disease of native coronary artery without angina pectoris: Secondary | ICD-10-CM | POA: Insufficient documentation

## 2013-05-14 DIAGNOSIS — Z79899 Other long term (current) drug therapy: Secondary | ICD-10-CM | POA: Insufficient documentation

## 2013-05-14 DIAGNOSIS — Z8669 Personal history of other diseases of the nervous system and sense organs: Secondary | ICD-10-CM | POA: Insufficient documentation

## 2013-05-14 DIAGNOSIS — Z7982 Long term (current) use of aspirin: Secondary | ICD-10-CM | POA: Insufficient documentation

## 2013-05-14 DIAGNOSIS — K589 Irritable bowel syndrome without diarrhea: Secondary | ICD-10-CM | POA: Insufficient documentation

## 2013-05-14 DIAGNOSIS — E785 Hyperlipidemia, unspecified: Secondary | ICD-10-CM | POA: Insufficient documentation

## 2013-05-14 DIAGNOSIS — E559 Vitamin D deficiency, unspecified: Secondary | ICD-10-CM | POA: Insufficient documentation

## 2013-05-14 DIAGNOSIS — F3289 Other specified depressive episodes: Secondary | ICD-10-CM | POA: Insufficient documentation

## 2013-05-14 DIAGNOSIS — F411 Generalized anxiety disorder: Secondary | ICD-10-CM | POA: Insufficient documentation

## 2013-05-14 MED ORDER — ALBUTEROL SULFATE (5 MG/ML) 0.5% IN NEBU
5.0000 mg | INHALATION_SOLUTION | Freq: Once | RESPIRATORY_TRACT | Status: AC
  Administered 2013-05-14: 5 mg via RESPIRATORY_TRACT
  Filled 2013-05-14: qty 1

## 2013-05-14 NOTE — ED Notes (Signed)
Pt. reports SOB with wheezing , runny nose and sneezing onset this evening from dust at home and at work .

## 2013-05-15 ENCOUNTER — Emergency Department (HOSPITAL_COMMUNITY)
Admission: EM | Admit: 2013-05-15 | Discharge: 2013-05-15 | Disposition: A | Discharge status: Home / Self Care | Payer: Medicare Other | Attending: Emergency Medicine | Admitting: Emergency Medicine

## 2013-05-15 ENCOUNTER — Emergency Department (HOSPITAL_COMMUNITY): Payer: Medicare Other

## 2013-05-15 DIAGNOSIS — J9801 Acute bronchospasm: Secondary | ICD-10-CM

## 2013-05-15 IMAGING — CR DG CHEST 2V
2 series · 2 of 2 positions shown · non-contrast
Comparison: Chest radiograph performed 06/09/2012

CLINICAL DATA: Wheezing and worsening shortness of breath.

EXAM:
CHEST  2 VIEW

[w chest pa]
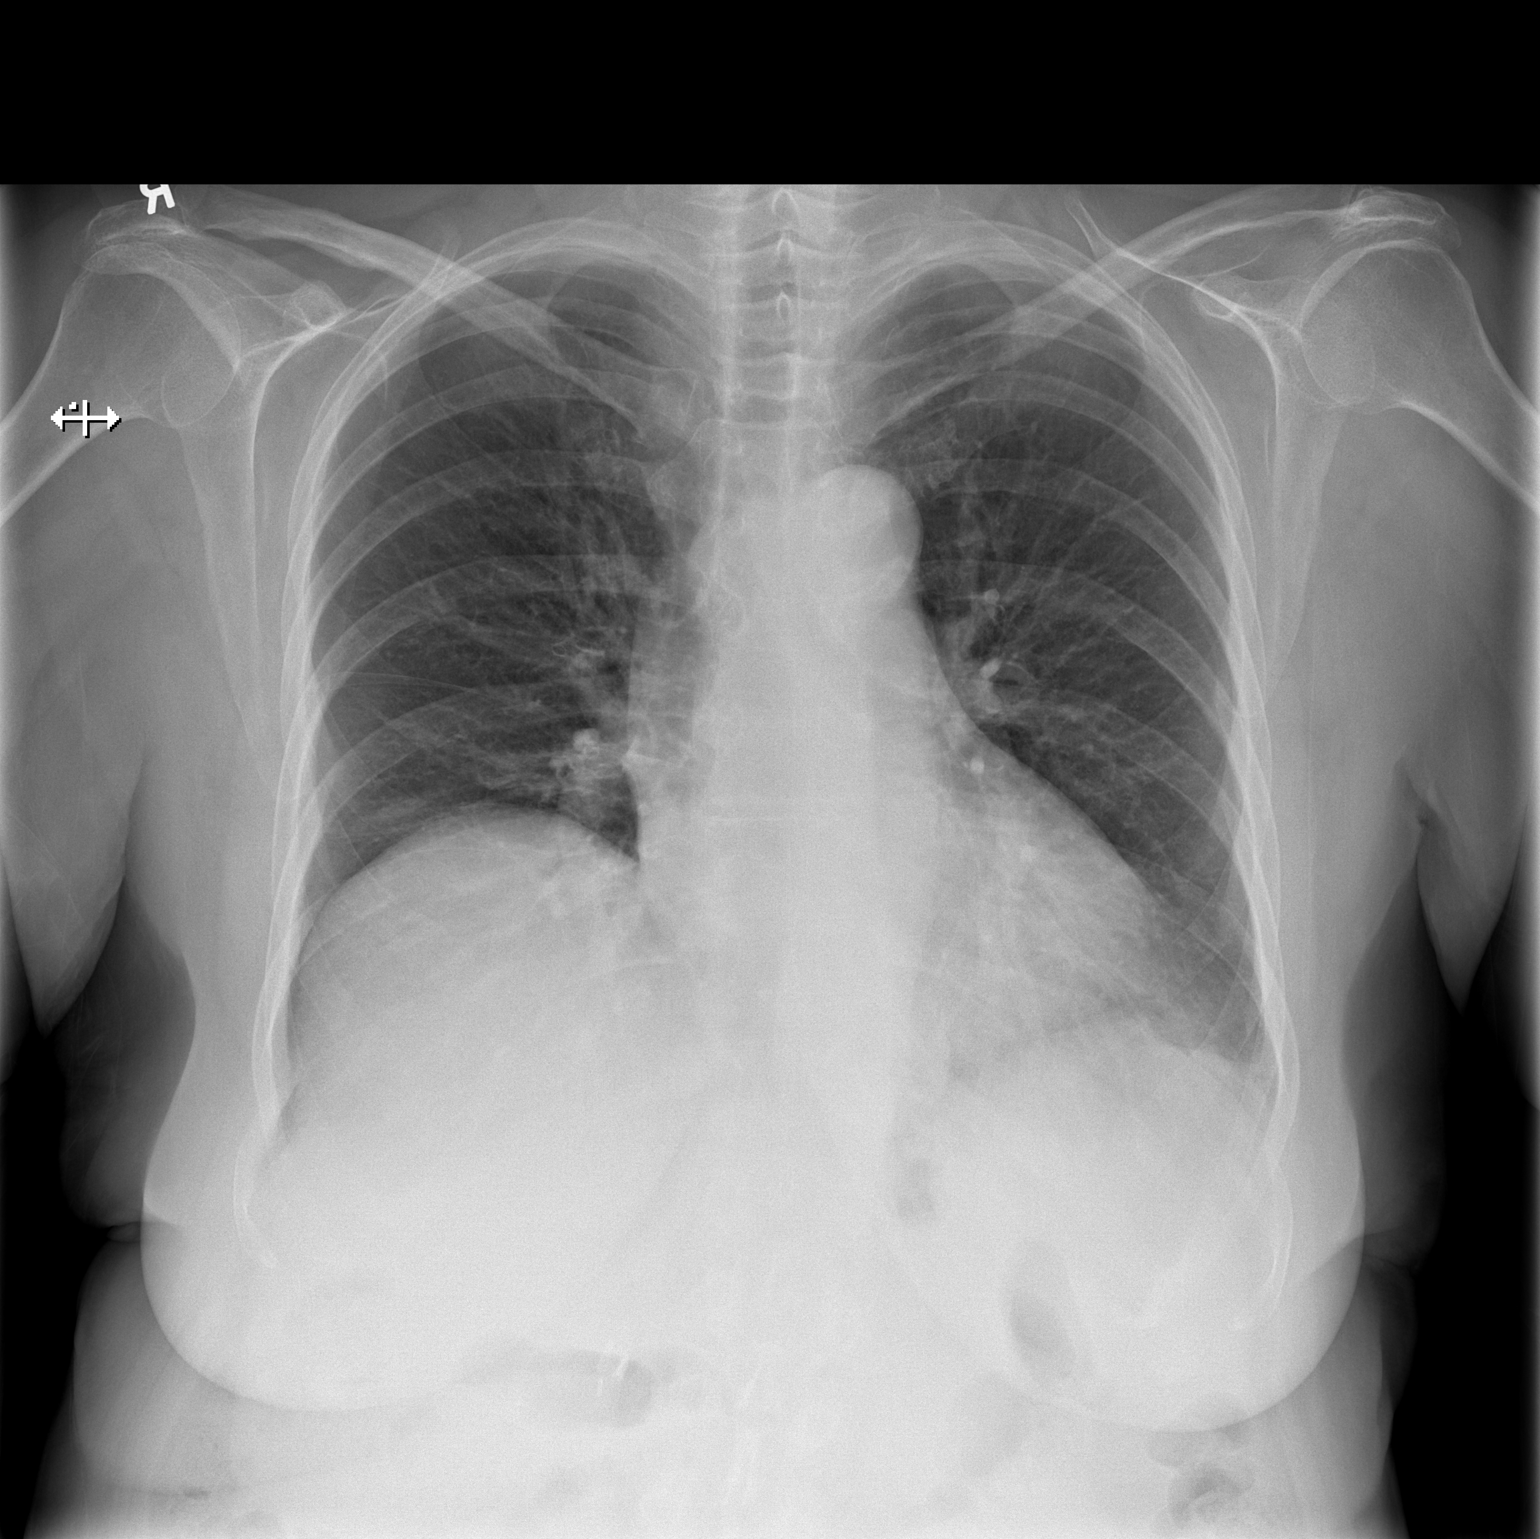

[w chest lat]
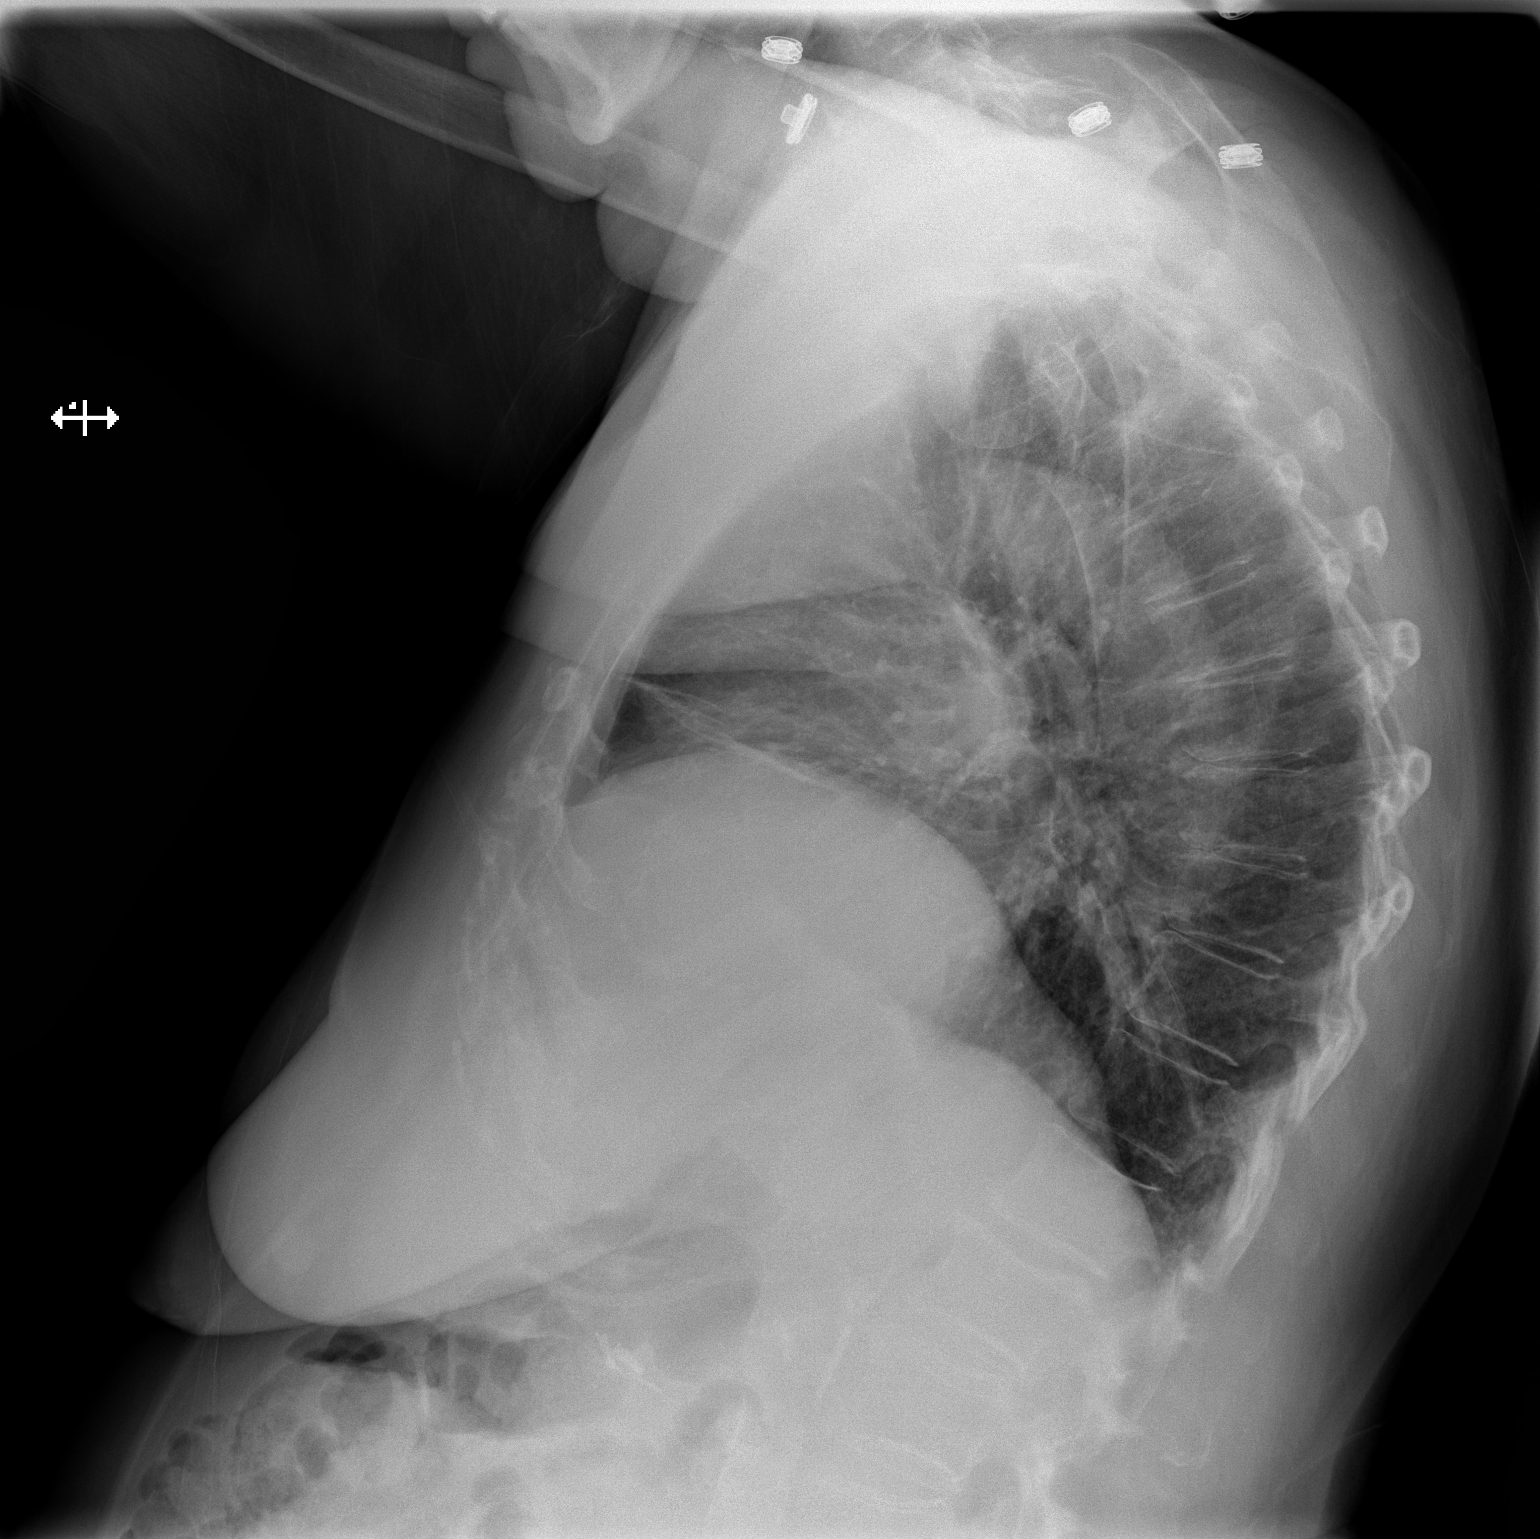

[2 of 2 positions shown; findings below may reference images not displayed]

FINDINGS: The lungs are well-aerated. Mild bibasilar opacities likely reflect
atelectasis. There is no evidence of pleural effusion or
pneumothorax. There is stable elevation of the right hemidiaphragm.

The heart is normal in size; the mediastinal contour is within
normal limits. No acute osseous abnormalities are seen.
IMPRESSION: Mild bibasilar opacities likely reflect atelectasis; lungs otherwise
clear. Stable elevation of the right hemidiaphragm.

## 2013-05-15 MED ORDER — ALBUTEROL SULFATE HFA 108 (90 BASE) MCG/ACT IN AERS: 1.0000 | INHALATION_SPRAY | RESPIRATORY_TRACT | Status: DC | PRN

## 2013-05-15 MED ORDER — ALBUTEROL SULFATE HFA 108 (90 BASE) MCG/ACT IN AERS
1.0000 | INHALATION_SPRAY | RESPIRATORY_TRACT | Status: DC | PRN
  Filled 2013-05-15: qty 6.7

## 2013-05-15 NOTE — ED Notes (Signed)
Family at bedside. 

## 2013-05-15 NOTE — ED Provider Notes (Signed)
CSN: [id number]     Arrival date & time 05/14/13  2259 History   First MD Initiated Contact with Patient 05/15/13 0240     Chief Complaint  Patient presents with  . Wheezing  . Nasal Congestion   (Consider location/radiation/quality/duration/timing/severity/associated sxs/prior Treatment) HPI 78 yo female presents to the ER from home with complaint of nasal congestion, sneezing, cough, wheezing.  Pt reports both at work and at home she has recently been exposed to a lot of dust.  She reports h/o allergies for which she takes nasal steroid and zyrtec.  She denies h/o asthma, but family report she often wheezes and coughs.  Pt has received neb tx prior to my evaluation, and reports complete resolution of all symptoms.  Past Medical History  Diagnosis Date  . Unspecified hearing loss   . Allergic rhinitis, cause unspecified   . Unspecified chronic bronchitis   . Unspecified essential hypertension   . Coronary atherosclerosis of unspecified type of vessel, native or graft   . Unspecified venous (peripheral) insufficiency   . Type II or unspecified type diabetes mellitus without mention of complication, not stated as uncontrolled   . Osteoarthrosis, unspecified whether generalized or localized, unspecified site   . Unspecified vitamin D deficiency   . Anxiety state, unspecified   . Hyperlipidemia   . IBS (irritable bowel syndrome)   . Asthma   . Depression    Past Surgical History  Procedure Laterality Date  . Dilation and curettage of uterus  1960's  . Cholecystectomy  1997  . Left elbow surgery  1992  . Cataract surg  2009   Family History  Problem Relation Age of Onset  . Emphysema Mother   . Heart disease Father   . Colon cancer    . Pancreatic cancer    . Colon cancer Sister    History  Substance Use Topics  . Smoking status: Never Smoker   . Smokeless tobacco: Never Used  . Alcohol Use: No   OB History   Grav Para Term Preterm Abortions TAB SAB Ect Mult Living   6 4 3 1 2  2   4      Review of Systems  All other systems reviewed and are negative.    Allergies  Penicillins  Home Medications   Current Outpatient Rx  Name  Route  Sig  Dispense  Refill  . acetaminophen (TYLENOL) 325 MG tablet   Oral   Take 325 mg by mouth every 6 (six) hours as needed. Pain/headache         . ALPRAZolam (XANAX) 0.5 MG tablet      TAKE 1/2 to 1  TABLET BY MOUTH THREE TIMES DAILY AS NEEDED   90 tablet   5   . aspirin 81 MG tablet   Oral   Take 81 mg by mouth daily.           . cetirizine (ZYRTEC) 10 MG tablet   Oral   Take 10 mg by mouth daily.           Marland Kitchen dicyclomine (BENTYL) 10 MG capsule      TAKE ONE CAPSULE BY MOUTH THREE TIMES DAILY BEFORE MEALS   90 capsule   5   . ergocalciferol (VITAMIN D2) 50000 UNITS capsule   Oral   Take 50,000 Units by mouth every Monday.          . fluticasone (FLONASE) 50 MCG/ACT nasal spray      SPRAY TWICE IN EACH NOSTRIL  EVERY NIGHT AT BEDTIME   16 g   6   . furosemide (LASIX) 40 MG tablet      TAKE 2 TABLET BY MOUTH EVERY DAY         . metFORMIN (GLUCOPHAGE) 500 MG tablet      TAKE 1 TABLET BY MOUTH EVERY DAY   30 tablet   PRN   . NIFEdipine (NIFEDICAL XL) 60 MG 24 hr tablet      TAKE 1 TABLET BY MOUTH EVERY DAY   30 tablet   11   . NONFORMULARY OR COMPOUNDED ITEM   Vaginal   Place 1 Applicatorful vaginally See admin instructions. Estradiol .02% 1 ML Prefilled Applicator Sig: apply vaginally twice a week #90 Day Supply with 4 refills         . simvastatin (ZOCOR) 80 MG tablet      TAKE 1 TABLET BY MOUTH AT BEDTIME   30 tablet   PRN   . WELCHOL 625 MG tablet      TAKE 2 TABLETS BY MOUTH EVERY DAY   60 tablet   5   . albuterol (PROAIR HFA) 108 (90 BASE) MCG/ACT inhaler   Inhalation   Inhale 1-2 puffs into the lungs every 4 (four) hours as needed for wheezing or shortness of breath.   1 Inhaler   0    BP 125/51  Pulse 89  Temp(Src) 97.6 F (36.4 C) (Oral)   Resp 16  SpO2 95% Physical Exam  Nursing note and vitals reviewed. Constitutional: She is oriented to person, place, and time. She appears well-developed and well-nourished. No distress.  HENT:  Head: Normocephalic and atraumatic.  Right Ear: External ear normal.  Left Ear: External ear normal.  Nose: Nose normal.  Mouth/Throat: Oropharynx is clear and moist.  Eyes: Conjunctivae and EOM are normal. Pupils are equal, round, and reactive to light.  Neck: Normal range of motion. Neck supple. No JVD present. No tracheal deviation present. No thyromegaly present.  Cardiovascular: Normal rate, regular rhythm, normal heart sounds and intact distal pulses.  Exam reveals no gallop and no friction rub.   No murmur heard. Pulmonary/Chest: Effort normal and breath sounds normal. No stridor. No respiratory distress. She has no wheezes. She has no rales. She exhibits no tenderness.  Abdominal: Soft. Bowel sounds are normal. She exhibits no distension and no mass. There is no tenderness. There is no rebound and no guarding.  Musculoskeletal: Normal range of motion. She exhibits no edema and no tenderness.  Lymphadenopathy:    She has no cervical adenopathy.  Neurological: She is alert and oriented to person, place, and time. She exhibits normal muscle tone. Coordination normal.  Skin: Skin is warm and dry. No rash noted. No erythema. No pallor.  Psychiatric: She has a normal mood and affect. Her behavior is normal. Judgment and thought content normal.    ED Course  Procedures (including critical care time) Labs Review Labs Reviewed - No data to display Imaging Review Dg Chest 2 View  05/15/2013   CLINICAL DATA:  Wheezing and worsening shortness of breath.  EXAM: CHEST  2 VIEW  COMPARISON:  Chest radiograph performed 06/09/2012  FINDINGS: The lungs are well-aerated. Mild bibasilar opacities likely reflect atelectasis. There is no evidence of pleural effusion or pneumothorax. There is stable elevation  of the right hemidiaphragm.  The heart is normal in size; the mediastinal contour is within normal limits. No acute osseous abnormalities are seen.  IMPRESSION: Mild bibasilar opacities likely reflect atelectasis;  lungs otherwise clear. Stable elevation of the right hemidiaphragm.   Electronically Signed   By: Roanna Raider M.D.   On: 05/15/2013 02:18    EKG Interpretation   None       MDM   1. Bronchospasm, acute   78 year old female with runny nose, sneezing, cough, and wheezing this evening.  Patient reports she's been exposed to dust more than usual.  Patient has received albuterol treatment prior to my evaluation.  Chest x-ray, normal.  No further wheezing.  Will prescribe albuterol inhaler for use.    Olivia Mackie, MD 05/15/13 2137

## 2013-05-31 ENCOUNTER — Other Ambulatory Visit: Payer: Self-pay | Admitting: Pulmonary Disease

## 2013-06-12 ENCOUNTER — Ambulatory Visit (INDEPENDENT_AMBULATORY_CARE_PROVIDER_SITE_OTHER): Payer: Medicare Other | Admitting: Pulmonary Disease

## 2013-06-12 ENCOUNTER — Encounter: Payer: Self-pay | Admitting: Pulmonary Disease

## 2013-06-12 ENCOUNTER — Other Ambulatory Visit: Payer: Self-pay | Admitting: Pulmonary Disease

## 2013-06-12 ENCOUNTER — Other Ambulatory Visit (INDEPENDENT_AMBULATORY_CARE_PROVIDER_SITE_OTHER): Payer: Medicare Other

## 2013-06-12 VITALS — BP 128/76 | HR 73 | Temp 97.3°F | Ht 64.0 in | Wt 186.2 lb

## 2013-06-12 DIAGNOSIS — E785 Hyperlipidemia, unspecified: Secondary | ICD-10-CM

## 2013-06-12 DIAGNOSIS — N812 Incomplete uterovaginal prolapse: Secondary | ICD-10-CM

## 2013-06-12 DIAGNOSIS — I10 Essential (primary) hypertension: Secondary | ICD-10-CM

## 2013-06-12 DIAGNOSIS — E119 Type 2 diabetes mellitus without complications: Secondary | ICD-10-CM

## 2013-06-12 DIAGNOSIS — R609 Edema, unspecified: Secondary | ICD-10-CM

## 2013-06-12 DIAGNOSIS — K589 Irritable bowel syndrome without diarrhea: Secondary | ICD-10-CM

## 2013-06-12 DIAGNOSIS — Z1231 Encounter for screening mammogram for malignant neoplasm of breast: Secondary | ICD-10-CM

## 2013-06-12 DIAGNOSIS — I872 Venous insufficiency (chronic) (peripheral): Secondary | ICD-10-CM

## 2013-06-12 DIAGNOSIS — K5730 Diverticulosis of large intestine without perforation or abscess without bleeding: Secondary | ICD-10-CM

## 2013-06-12 DIAGNOSIS — F411 Generalized anxiety disorder: Secondary | ICD-10-CM

## 2013-06-12 DIAGNOSIS — I2510 Atherosclerotic heart disease of native coronary artery without angina pectoris: Secondary | ICD-10-CM

## 2013-06-12 DIAGNOSIS — J42 Unspecified chronic bronchitis: Secondary | ICD-10-CM

## 2013-06-12 DIAGNOSIS — M1990 Unspecified osteoarthritis, unspecified site: Secondary | ICD-10-CM

## 2013-06-12 DIAGNOSIS — E559 Vitamin D deficiency, unspecified: Secondary | ICD-10-CM

## 2013-06-12 LAB — BASIC METABOLIC PANEL
BUN: 9 mg/dL (ref 6–23)
CALCIUM: 9.5 mg/dL (ref 8.4–10.5)
CO2: 30 mEq/L (ref 19–32)
Chloride: 99 mEq/L (ref 96–112)
Creatinine, Ser: 0.9 mg/dL (ref 0.4–1.2)
GFR: 68.09 mL/min (ref >60.00)
GLUCOSE: 127 mg/dL — AB (ref 70–99)
Potassium: 4.9 mEq/L (ref 3.5–5.1)
Sodium: 138 mEq/L (ref 135–145)

## 2013-06-12 LAB — CBC WITH DIFFERENTIAL/PLATELET
BASOS ABS: 0.1 10*3/uL (ref 0.0–0.1)
Basophils Relative: 0.6 % (ref 0.0–3.0)
Eosinophils Absolute: 0.7 10*3/uL (ref 0.0–0.7)
Eosinophils Relative: 8.3 % — ABNORMAL HIGH (ref 0.0–5.0)
HCT: 42.7 % (ref 36.0–46.0)
HEMOGLOBIN: 14.4 g/dL (ref 12.0–15.0)
LYMPHS PCT: 24 % (ref 12.0–46.0)
Lymphs Abs: 2 10*3/uL (ref 0.7–4.0)
MCHC: 33.7 g/dL (ref 30.0–36.0)
MCV: 92.2 fl (ref 78.0–100.0)
MONOS PCT: 8.2 % (ref 3.0–12.0)
Monocytes Absolute: 0.7 10*3/uL (ref 0.1–1.0)
Neutro Abs: 4.8 10*3/uL (ref 1.4–7.7)
Neutrophils Relative %: 58.9 % (ref 43.0–77.0)
PLATELETS: 277 10*3/uL (ref 150.0–400.0)
RBC: 4.63 Mil/uL (ref 3.87–5.11)
RDW: 13.8 % (ref 11.5–14.6)
WBC: 8.2 10*3/uL (ref 4.5–10.5)

## 2013-06-12 LAB — HEPATIC FUNCTION PANEL
ALBUMIN: 4.2 g/dL (ref 3.5–5.2)
ALT: 15 U/L (ref 0–35)
AST: 18 U/L (ref 0–37)
Alkaline Phosphatase: 58 U/L (ref 39–117)
Bilirubin, Direct: 0.1 mg/dL (ref 0.0–0.3)
Total Bilirubin: 0.4 mg/dL (ref 0.3–1.2)
Total Protein: 8.2 g/dL (ref 6.0–8.3)

## 2013-06-12 LAB — HEMOGLOBIN A1C: HEMOGLOBIN A1C: 6.9 % — AB (ref 4.6–6.5)

## 2013-06-12 LAB — LIPID PANEL
Cholesterol: 220 mg/dL — ABNORMAL HIGH (ref 0–200)
HDL: 48.1 mg/dL (ref >39.00)
Total CHOL/HDL Ratio: 5
Triglycerides: 357 mg/dL — ABNORMAL HIGH (ref 0.0–149.0)
VLDL: 71.4 mg/dL — ABNORMAL HIGH (ref 0.0–40.0)

## 2013-06-12 LAB — LDL CHOLESTEROL, DIRECT: LDL DIRECT: 109.3 mg/dL

## 2013-06-12 LAB — TSH: TSH: 2.92 u[IU]/mL (ref 0.35–5.50)

## 2013-06-12 MED ORDER — ERGOCALCIFEROL 1.25 MG (50000 UT) PO CAPS: 50000.0000 [IU] | ORAL_CAPSULE | ORAL | Status: DC

## 2013-06-12 MED ORDER — HYDROCORTISONE 1 % EX CREA: 1.0000 "application " | TOPICAL_CREAM | Freq: Two times a day (BID) | CUTANEOUS | Status: DC

## 2013-06-12 MED ORDER — ALBUTEROL SULFATE HFA 108 (90 BASE) MCG/ACT IN AERS: 1.0000 | INHALATION_SPRAY | RESPIRATORY_TRACT | Status: DC | PRN

## 2013-06-12 NOTE — Progress Notes (Signed)
Subjective:    Patient ID: Nichole Silva, female    DOB: [birth year], 79 y.o.   MRN: [id number]  HPI 79 y/o WF here for a follow up visit...she has mult med problems as noted below... SEE PREV EPIC NOTES FOR EARLIER DATA >>  ~  February 23, 2012:  71mo ROV & Sandrine has had a good interval- no new complaints or concerns;  She has had some difficulty w/ her hearing aides & is working w/ [employer];  BP is controlled on her Procardia & Lasix, w/ persist 2-3+ edema/ VI changes- she knows to elim salt & wears support hose, weight is unchanged at 184-5#;  She denies CP, palpit, SOB, etc;  Lipids & DM management under good control w/ Simva80, Welchol2/d, & Metform500;  She credits the [facility] w/ helping her IBS as well- she is due for Colonoscopy & will call DrDBrodie to set this up...    We reviewed prob list, meds, xrays and labs> see below for updates >> OK 2013 Flu vaccine today...  ~  June 09, 2012:  81mo ROV & pre-op clearance requested by DrFernandez for possible vaginal prolapse surg in Feb;  She has a pessary now & GYN changes it every 68mo but she notes that she may need surg- she is incont & uses pads, she is very anxious & worries about infection, she denies pain, she notes bowel movements are OK w/ fiber supplements... We discussed checking CXR, EKG, & Fasting blood work... We reviewed the following medical problems during today's office visit>>     HOH> has hearing aides & doing satis...    AR & recurrent bronchitis> on Zyrtek, Flonase; she denies recent URI, bronchitic exac, cough/ phlegm, ch in SOB, edema, etc...    HBP> on Nifedipine60 & Lasix40- 1to2/d; BP= 142/64 & she denies CP, palpit, SOB, etc...    CAD> on ASA81; she is too sedentary but occas has to be on her feet for hrs at [employer] job; denies angina, etc...    VI> on low sodium, elevation, support hose & Lasix40 taking 1-2 daily; she has some chronic edema & dermatitis- Rx w/ topical steroid...    Chol> on Simva80 & Welchol2/d; FLP  4/13 showed TChol 187, TG 113, HDL 55, LDL 110; FLP 1/14 shows TChol 161, TG 172, HDL 42, LDL 85    DM> on Metform500/d & diet; Labs 4/13 showed BS= 118, A1c=6.5; Labs 1/14 shows BS=137, A1c=6.4; Rec- continue same, better diet.    GI- IBS> on Bentyl10 prn; notes occas diarrhea & 1 immodium will usually do the trick; Colonoscopy 1/14 showed mod sigmoid divertics & rec for hi fiber diet...    GYN- Vag prolapse & followed by DrFernandez on Estradiol cream & pessary Rx but she may need surgery she says; she had an EColi UTI treated in Oct2013...    DJD> left knee pain since a fall & XRay w/ degen changes; she prefers just Tylenol prn & manages well...    VitD defic> on VitD 50K weekly; prev mammograms/ BMD/ etc- per DrFernandez; VitD level = 20 & rec to take 50K supplement every week.    Anxiety> on Xanax prn; incr stress w/ daugh etoh/drugs age 45 now doing better... We reviewed prob list, meds, xrays and labs> see below for updates >>   CXR 1/14 showed stable heart size, elev right hemidiaph, scarring right base w/o change, NAD.Marland KitchenMarland Kitchen  EKG 1/14 showed NSR, rate70, NSSTTWA...  LABS 1/14:  FLP- ok on Simva80+Welchol;  Chems- ok  x BS=137, A1c=6.4;  CBC- wnl;  TSH=3.05;  VitD=20 & rec to continue 50K wkly...   ~  December 07, 2012:  12mo ROV & Lashaya indicates a good 24mo interval- still works at [employer] part time, had colonoscopy & mammogram- updated below...     BP controlled on Procardia & Lasix, measures 130/74 today, denies CP/ palpit/ SOB, edema, etc...     Chol regulated on diet & Simva80 + Welchol 2/d; last FLP 1/14 showed TChol 161, TG 172, HDL 42, LDL 85; she is not fasting today...     BS controlled w/ diet & Metformin500/d; Labs 1/14 showed BS=137, A1c=6.4    She takes Bentyl for IBS, and uses Estradiol cream per Gyn DrFernandez, has pessary changed every 85mo for her cystocele & prolapse.     She is on VitD50K weekly and uses Xanaqx 0.5mg  prn for her stress/anxiety We reviewed prob list, meds, xrays  and labs> see below for updates >> Meds refilled per request...   ~  June 12, 2013:  35mo ROV & post-ER check> she notes several episodes of SOB/ wheezing assoc w/ dusting/sweeping at work & husb smoking/ new floor placed in her home; c/o LBP assoc w/ her work at [employer]; Lip[place] on back- DrVoytek is watching it...     HOH> has hearing aides & doing satis...    AR & recurrent bronchitis> on Zyrtek, Flonase, & ProventilHFA; she denies recent URI, bronchitic exac, cough/ phlegm, ch in SOB, edema, etc...    HBP> on Nifedipine60 & Lasix40- 1to2/d; BP= 128/76 & she denies CP, palpit, SOB, etc...    CAD> on ASA81; she is too sedentary but occas has to be on her feet for hrs at [employer] job; denies angina, etc...    VI> on low sodium, elevation, support hose & Lasix40 taking 1-2 daily; she has some chronic edema & dermatitis- Rx w/ topical steroid...    Chol> on Simva80 & Welchol2/d; FLP 1/14 shows TChol 161, TG 172, HDL 42, LDL 85    DM> on Metform500/d & diet; Labs 1/15 showed BS= 127, A1c=6.9.Marland KitchenMarland Kitchen continue same, better diet.    GI- IBS> on Bentyl10 prn; notes occas diarrhea & 1 immodium will usually do the trick; Colonoscopy 1/14 showed mod sigmoid divertics & rec for hi fiber diet...    GYN- Vag prolapse & followed by DrFernandez on Estradiol cream & pessary Rx but she may need surgery she says; she had an EColi UTI treated in Oct2013...    DJD> left knee pain since a fall & XRay w/ degen changes; she prefers just Tylenol prn & manages well...    VitD defic> on VitD 50K weekly; prev mammograms/ BMD/ etc- per DrFernandez; VitD level = 20 & rec to take 50K supplement every week.    Anxiety> on Xanax prn; incr stress w/ daugh etoh/drugs age 8 now doing better... We reviewed prob list, meds, xrays and labs> see below for updates >> meds refilled per requests; had Flu shot in Nov 2014...   LABS 1/15:  FLP- not at goals on Simva80+Welchol;  Chems- ok x BS=127, A1c=6.9;  CBC- wnl;  TSH=2.92;  VitD=18;  Urine  microalb- neg...           Problem List:  DECREASED HEARING (ICD-389.9) - she has digital hearing aides bilat & is working thru [employer]...  ALLERGIC RHINITIS (ICD-477.9) - on ZYRTEK daily & FLONASE Qhs...  Hx of BRONCHITIS, RECURRENT (ICD-491.9) - she denies recent URIs or resp exac, takes [facility] as needed... ~  CXR 12/11 showed heart at upper lim of normal, lungs clear x mild peribronch thickening, NAD.Marland Kitchen. ~  CXR 1/14 showed stable heart size, elev right hemidiaph, scarring right base w/o change, NAD.Marland Kitchen. ~  CXR 12/14 showed stable heart size, elev right hemidiaph, mild bibasilar atx, NAD...  HYPERTENSION (ICD-401.9) - on PROCARDIA XL 60mg /d & LASIX 40mg  1-2 daily...  ~  CXR 4/11 showed mild basilar atelectasis, NAD.Marland Kitchen. ~  5/12:  We decided to incr LASIX to 40mg  Bid due to edema & recheck... ~  10/12:  BP stable 7 chr edema improved w/ Lasix40mg  taking 1-2 daily... ~  4/13:  BP=132/76 today, and feeling well... BP's at home are similar range... denies HA, fatigue, visual changes, CP, palipit, dizziness, syncope, dyspnea, edema, etc... ~  10/13:  BP= 130/78 & feeling well; denies CP, palpit, SOB, ch in edema, etc... ~  1/14: on Nifedipine60 & Lasix40- 1to2/d; BP= 142/64 & she denies CP, palpit, SOB, etc... ~  EKG 1/14 showed NSR, rate70, NSSTTWA, NAD...   CORONARY ARTERY DISEASE (ICD-414.00) - on ASA 81mg /d... no CP, palpit, or change in SOB. ~  cath 1989 by DrStuckey showed non-obstructive dis w/ 40-50% LAD lesion, otherw norm... ~  EKG shows NSR, NSSTTWA (last 1/14 w/o change).  VENOUS INSUFFICIENCY (ICD-459.81) - she knows to elim sodium, elevate legs, wear support hose, & take the LASIX 40mg  ==> incr to Bid. ~  6/12:  Edema improved & wt down 3# on the Lasix 80mg /d; BUN=10, Creat=1.0, continue same dose... ~  4/13:  Edema stable & wt 185# on Lasix40- taking 1-2 tabs daily; BUN= 9, Creat= 0.9  HYPERLIPIDEMIA (ICD-272.4) - on SIMVASTATIN 80mg /d + WELCHOL 2/d... ~  [place] 9/08 on  Vytorin 10-80 showed TChol 143, TG 100, HDL 44, LDL 79 ~  FLP 4/09 on Vytorin still showed TChol 153, TG 95, HDL 42, LDL 92... changed to Simva80 for $$. ~  [place] 10/09 still on the Vytorin showed TChol 166, TG 134, HDL 46, LDL 94 ~  FLP 4/10 on Simva80 showed TChol 203, TG 141, HDL 47, LDL 131... rec> same + diet! ~  FLP 10/10 on Simva80 showed TChol 176, TG 139, HDL 41, LDL 107 ~  FLP 4/11 showed TChol 213, TG 225, HDL 50, LDL 130... consider change med, ?add zetia, for now= BETTER DIET! ~  FLP 10/11 on Simva80+Welchol2 showed TChol 185, TG 232, HDL 53, LDL 103 ~  FLP 5/12 on Simva80+Welchol2 showed TChol 155, TG 111, HDL 43, LDL 90... Great job! ~  [place] 4/13 on Simva80+Welchol2 showed TChol 187, TG 113, HDL 55, LDL 110 ~  FLP 1/14 on Simva80+Welchol2 showed  TChol 161, TG 172, HDL 42, LDL 85   DIABETES MELLITUS (ICD-250.00) - on METFORMIN 500mg /d and diet... ~  labs 9/08 showed BS=121, HgA1c=6.3 ~  labs 4/09 showed BS= 111, HgA1c= 6.5.Marland KitchenMarland Kitchen continue same Rx. ~  labs 9/09 (wt=192#) showed BS= 131, HgA1c= 6.6.Marland Kitchen. same. ~  Labs 4/10 (wt=190#) showed BS= 122, A1c= 6.5 ~  labs 10/10 (wt=194#) showed BS= 120, A1c= 6.5 ~  labs 4/11 (wt=186#) showed BS= 124, A1c= 6.6 ~  labs 10/11 (wt=181#) showed BS= 115, A1c= 6.4 ~  Labs 5/12 (wt=182#) showed BS= 105, A1c= 6.8 ~  Labs 4/13 (wt=186#) showed BS= 110, A1c= 6.5 ~  Labs 1/14 (wt=183#) showed BS= 137, A1c= 6.4  IRRITABLE BOWEL SYNDROME (ICD-564.1) - she uses BENTYL 20mg  as needed... she tells me she wears depends for diarrhea prob. ~  last colonoscopy 2/02 by DrBrodie  showed divertics only... f/u planned 54yrs. ~  4/11:  DrBrodie incr Bentyl to Tid & added WELCHOL 2tabs/d (diarrhea & incontinence improved) ~  4/13:  She is still taking the Welchol 2/d "it helps my IBS" she says... ~  1/14:  Colonoscopy 1/14 DrDBrodie showed mod sigmoid divertics & rec for hi fiber diet...  GYN >> Followed by DrFernandez w/ Vag Prolapse & his notes in EPIC are  reviewed... ~  Mammogram 1/14 showed prob benign features (right breast is larger) and sonar + f/u in 25mo is recommended...  ~  4/14: she had f/u DrFernandez & his EPIC note is reviewed> second degree cystocele & uterine prolapse w/o incont, Rx pessary.  DEGENERATIVE JOINT DISEASE (ICD-715.90) - on MOBIC 7.5mg  Prn...  VITAMIN D DEFICIENCY (ICD-268.9) - on Vit D 50000 u daily... ~  labs 4/10 showed Vit D level = 6... therefore started on Vit D 50000 u weekly. ~  labs 4/11 showed Vit D level = 38... OK to change to 2000 u daily. ~  Labs 5/12 showed Vit D level = 25... Change back to VitD 50K weekly. ~  Labs 4/13 showed Vit D level = 20... Asked to take VitD 50K every week! ~  Labs 1/14 showed Vit D level = 20... Reminded to take the 50K supplement every week...  ANXIETY (ICD-300.00) - on ALPRAZOLAM 0.5mg  Prn... she's under alot of stress w/ her husb and a 58 y/o nephew dx w/ Marfan's, s/p valve surg, needs heart transplant...  GYN = DrMcPhail w/ BMD 1/06 @ SER showing norm w/ TScores -0.3 to -0.8.Marland KitchenMarland Kitchen   Past Surgical History  Procedure Laterality Date  . Dilation and curettage of uterus  1960's  . Cholecystectomy  1997  . Left elbow surgery  1992  . Cataract surg  2009    Outpatient Encounter Prescriptions as of 06/12/2013  Medication Sig  . acetaminophen (TYLENOL) 325 MG tablet Take 325 mg by mouth every 6 (six) hours as needed. Pain/headache  . albuterol (PROAIR HFA) 108 (90 BASE) MCG/ACT inhaler Inhale 1-2 puffs into the lungs every 4 (four) hours as needed for wheezing or shortness of breath.  . ALPRAZolam (XANAX) 0.5 MG tablet TAKE 1/2- 1 TABLET BY MOUTH THREE TIMES DAILY AS NEEDED  . aspirin 81 MG tablet Take 81 mg by mouth daily.    . cetirizine (ZYRTEC) 10 MG tablet Take 10 mg by mouth daily.    Marland Kitchen dicyclomine (BENTYL) 10 MG capsule TAKE ONE CAPSULE BY MOUTH THREE TIMES DAILY BEFORE MEALS  . ergocalciferol (VITAMIN D2) 50000 UNITS capsule Take 50,000 Units by mouth every Monday.    . fluticasone (FLONASE) 50 MCG/ACT nasal spray SPRAY TWICE IN EACH NOSTRIL EVERY NIGHT AT BEDTIME  . furosemide (LASIX) 40 MG tablet TAKE 2 TABLET BY MOUTH EVERY DAY  . metFORMIN (GLUCOPHAGE) 500 MG tablet TAKE 1 TABLET BY MOUTH EVERY DAY  . NIFEdipine (NIFEDICAL XL) 60 MG 24 hr tablet TAKE 1 TABLET BY MOUTH EVERY DAY  . NONFORMULARY OR COMPOUNDED ITEM Place 1 Applicatorful vaginally See admin instructions. Estradiol .02% 1 ML Prefilled Applicator Sig: apply vaginally twice a week #90 Day Supply with 4 refills  . simvastatin (ZOCOR) 80 MG tablet TAKE 1 TABLET BY MOUTH AT BEDTIME  . WELCHOL 625 MG tablet TAKE 2 TABLETS BY MOUTH EVERY DAY    Allergies  Allergen Reactions  . Penicillins     REACTION: ITCHING AND SWELLING    Current Medications, Allergies, Past Medical History, Past Surgical History, Family History,  and Social History were reviewed in [employer] record.   Review of Systems        See HPI - all other systems neg except as noted... The patient complains of decreased hearing and dyspnea on exertion.  The patient denies anorexia, fever, weight loss, weight gain, vision loss, hoarseness, chest pain, syncope, peripheral edema, prolonged cough, headaches, hemoptysis, abdominal pain, melena, hematochezia, severe indigestion/heartburn, hematuria, incontinence, muscle weakness, suspicious skin lesions, transient blindness, difficulty walking, depression, unusual weight change, abnormal bleeding, enlarged lymph nodes, and angioedema.     Objective:   Physical Exam     WD, Overweight, 79 y/o WF in NAD... Vital Signs:  Reviewed... GENERAL:  Alert & oriented; pleasant & cooperative... HEENT:  [place]/AT, EOM-wnl, PERRLA, EACs-clear, TMs-wnl, NOSE-clear, THROAT-clear & wnl. NECK:  Supple w/ fairROM; no JVD; normal carotid impulses w/o bruits; no thyromegaly or nodules palpated; no lymphadenopathy. CHEST:  Clear to P & A; without wheezes/ rales/ or  rhonchi. HEART:  Regular Rhythm; without murmurs/ rubs/ or gallops. ABDOMEN:  Soft & nontender; normal bowel sounds; no organomegaly or masses detected. EXT: without deformities, mild arthritic changes; no varicose veins/ +venous insuffic/ 1+edema (chronic) NEURO:  CN's intact;  no focal neuro deficits... DERM:  chronic dry skin dermatitis on legs...  RADIOLOGY DATA:  Reviewed in the EPIC EMR & discussed w/ the patient...  LABORATORY DATA:  Reviewed in the EPIC EMR & discussed w/ the patient...   Assessment & Plan:    HBP>  Controlled on CCB & Diuretic; continue same...  CAD>  Denies angina etc but she is too sedentary; rec incr exercise, continue ASA, continue BP meds, statin, DM rx etc...  VI/ Edema/ Dermatitis>  Improved w/ Lasix40 taking 1-2 daily & BMet looks OK- continue same; dermatitis much improved w/ the HC ointment...  LIPIDS>  Stable on Simva, continue same for now> needs better diet, get wt down...  DM>  Stable on the Metformin, needs better diet, get wt down...  Vit D defic>  rec to return to Vit D Rx 50K weekly.Marland KitchenMarland Kitchen

## 2013-06-12 NOTE — Patient Instructions (Signed)
Today we updated your med list in our EPIC system...    Continue your current medications the same...  We refilled the meds you requested...  Today we did your follow up FASTING blood work...    We will contact you w/ the results when available...   Call for any questions...  Let's plan a follow up visit in 6mo, sooner if needed for problems...     

## 2013-06-13 LAB — VITAMIN D 25 HYDROXY (VIT D DEFICIENCY, FRACTURES): VIT D 25 HYDROXY: 18 ng/mL — AB (ref 30–89)

## 2013-06-13 LAB — MICROALBUMIN, URINE: MICROALB UR: 1.27 mg/dL (ref 0.00–1.89)

## 2013-06-22 ENCOUNTER — Telehealth: Payer: Self-pay | Admitting: Pulmonary Disease

## 2013-06-22 DIAGNOSIS — R21 Rash and other nonspecific skin eruption: Secondary | ICD-10-CM

## 2013-06-23 NOTE — Telephone Encounter (Signed)
Spoke with paula and is aware of recs. Referral has been placed. Nothing further needed

## 2013-06-23 NOTE — Telephone Encounter (Signed)
Pt's daughter, Curt Bears, is returning call, but asks [place] to call pt's other daughter, Nevin Bloodgood, at [phone].  Satira Anis

## 2013-06-23 NOTE — Telephone Encounter (Signed)
lmomtcb x1 for daughter 

## 2013-06-23 NOTE — Telephone Encounter (Signed)
Last OV 06-12-13. I spoke with the pt daughter and she states when the pt last saw SN she had some dermatitis on her leg. She states this is better but now she has developed a rash on her chest, inner arms, and inner thighs. She states the rash does not look like the dermatitis on her legs. She states it is red bumps that itches really bad. There is no pain or burning associated with the rash. The pt did use a new soap in the shower several days before the rash appeared. Pt daughter is not sure if this is related to stress. She states the pt was working but has recently retired and is home now with her husband and he is a Runner, broadcasting/film/video for the pt. Pt has been using cortisone cream for the rash without relief. Please advise. Butler Bing, CMA Allergies  Allergen Reactions  . Penicillins     REACTION: ITCHING AND SWELLING

## 2013-06-23 NOTE — Telephone Encounter (Signed)
Per SN---  Not able to treat rash over the phone---will need appt with derm for eval ASAP.  thanks

## 2013-06-27 ENCOUNTER — Other Ambulatory Visit: Payer: Self-pay | Admitting: Pulmonary Disease

## 2013-06-28 ENCOUNTER — Ambulatory Visit (HOSPITAL_COMMUNITY)
Admission: RE | Admit: 2013-06-28 | Discharge: 2013-06-28 | Disposition: A | Discharge status: Home / Self Care | Payer: Medicare Other | Source: Ambulatory Visit | Attending: Pulmonary Disease | Admitting: Pulmonary Disease

## 2013-06-28 DIAGNOSIS — Z1231 Encounter for screening mammogram for malignant neoplasm of breast: Secondary | ICD-10-CM | POA: Insufficient documentation

## 2013-06-28 IMAGING — MG MM SCREENING BREAST TOMO BILATERAL
8 series · 8 of 24 positions shown · non-contrast
Comparison: Previous exam(s).

CLINICAL DATA: Screening.

EXAM:
DIGITAL SCREENING BILATERAL MAMMOGRAM WITH 3D TOMO WITH CAD

[L MLO]
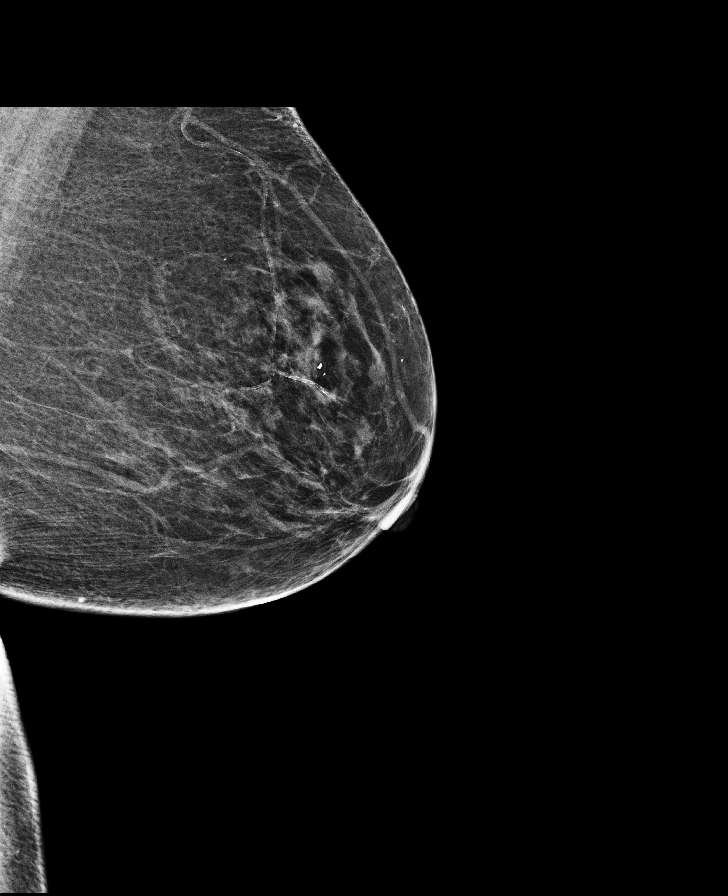

[R CC]
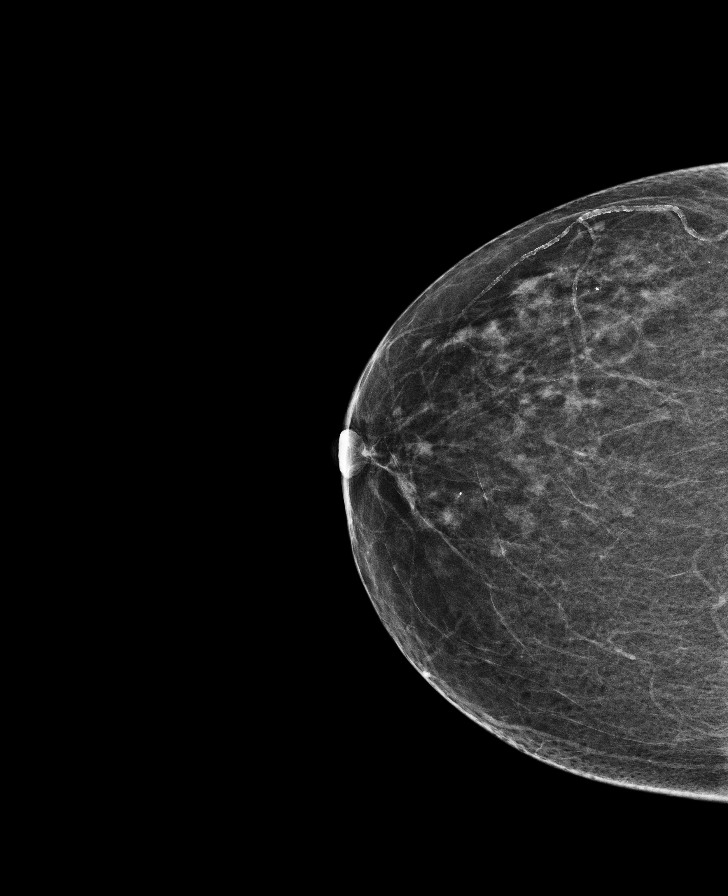

[L CC]
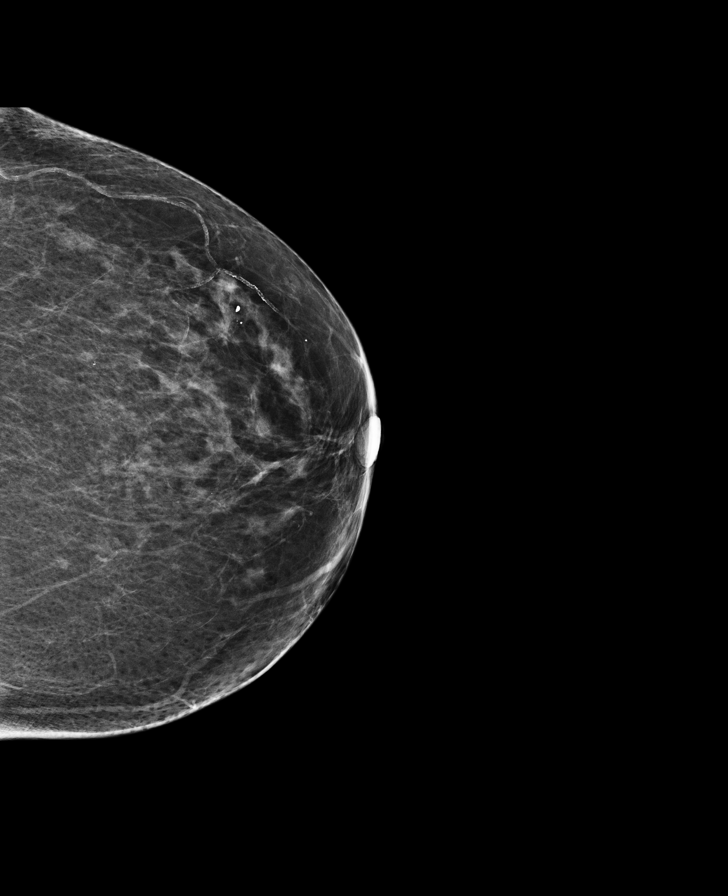

[R MLO]
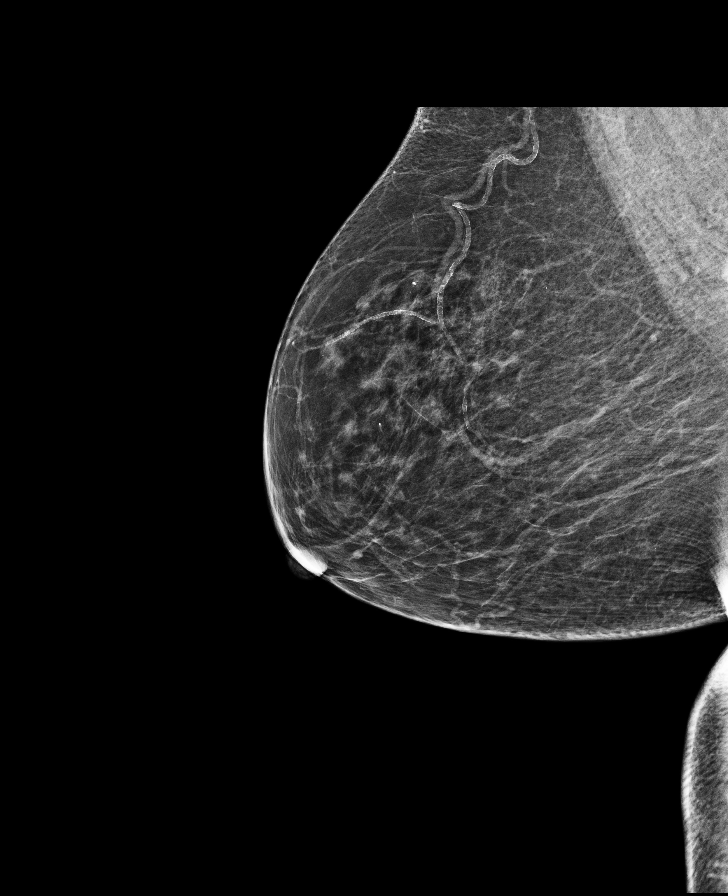

[L MLO tomo · tomo slice 30/59.0]
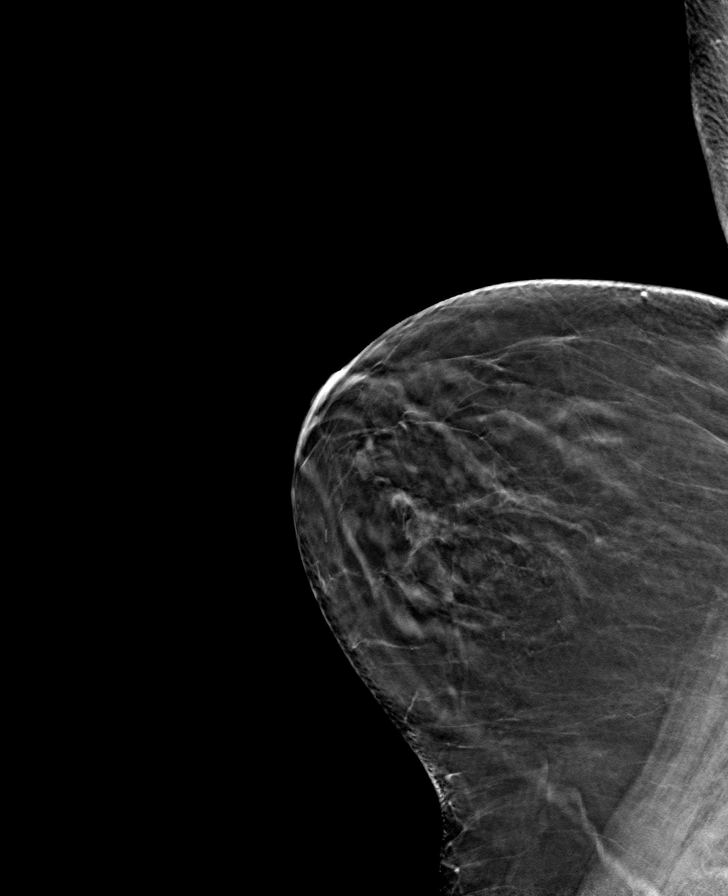

[R CC tomo · tomo slice 25/50.0]
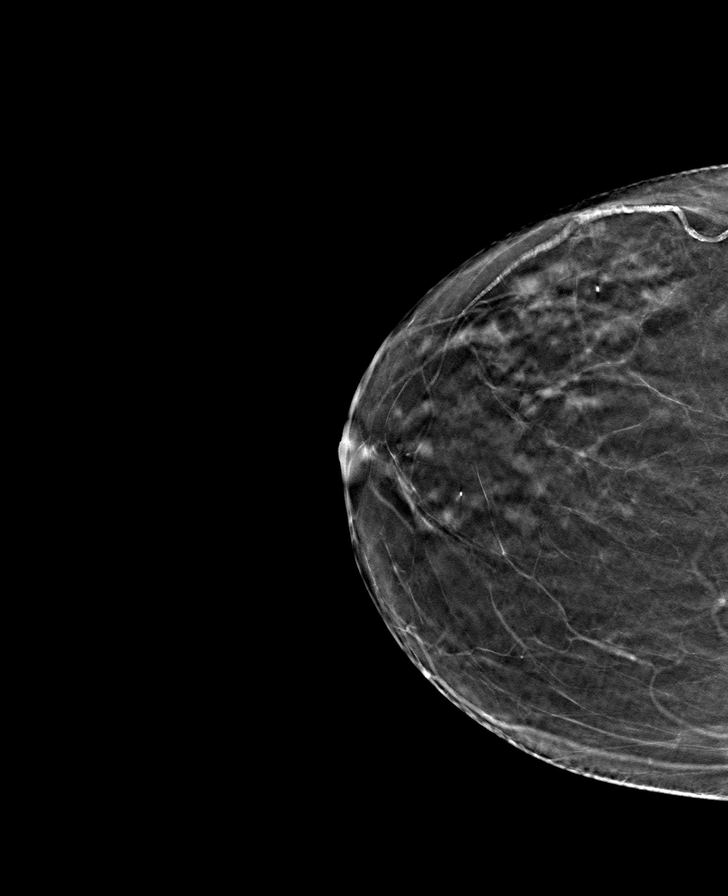

[L CC tomo · tomo slice 27/53.0]
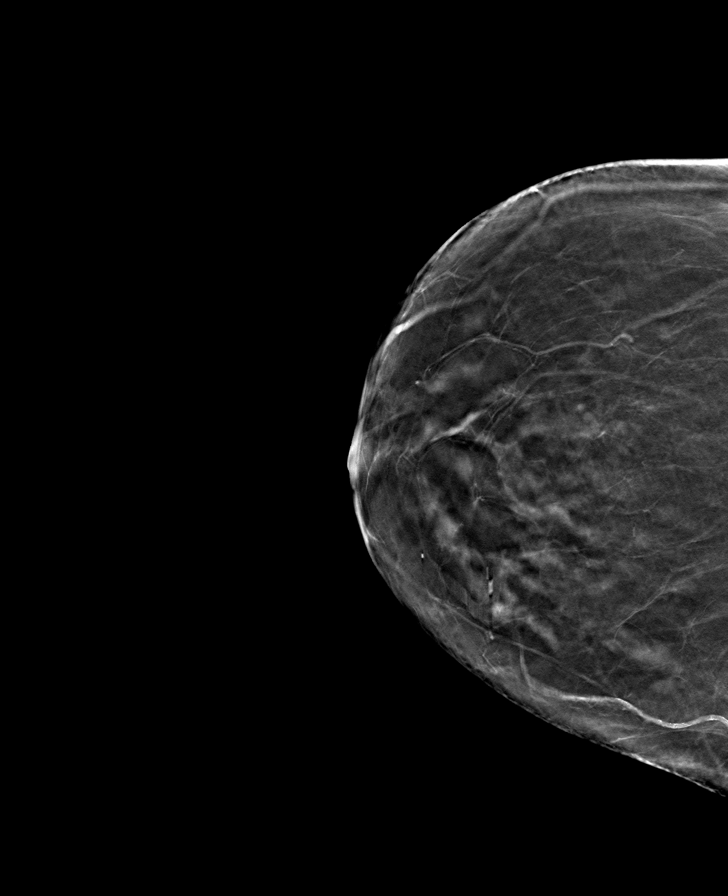

[R MLO tomo · tomo slice 30/59.0]
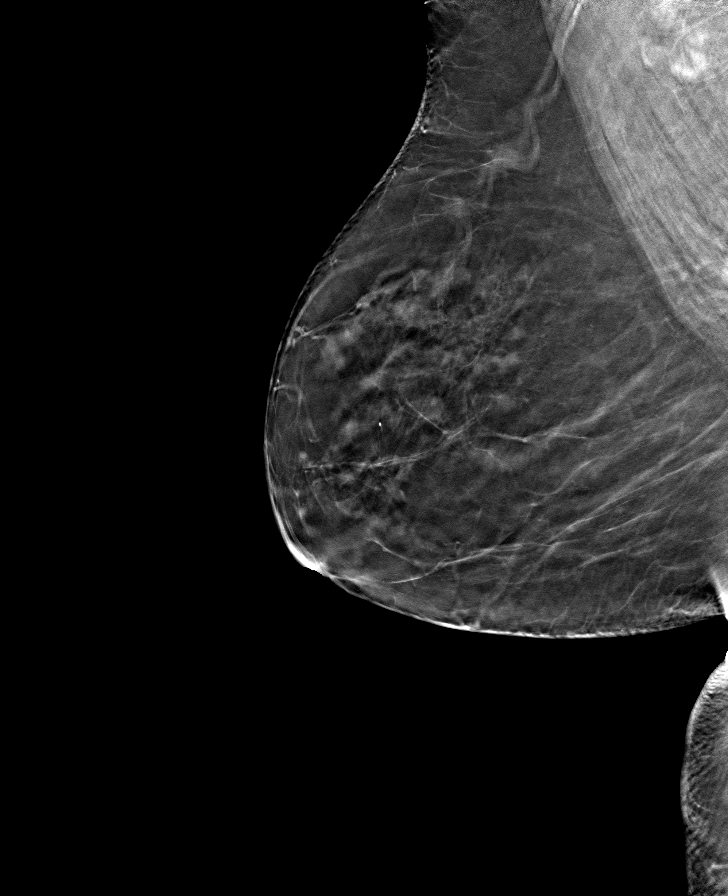

[8 of 24 positions shown; findings below may reference images not displayed]

ACR Breast Density Category b: There are scattered areas of
fibroglandular density.
FINDINGS: There are no findings suspicious for malignancy. Images were
processed with CAD.
IMPRESSION: No mammographic evidence of malignancy. A result letter of this
screening mammogram will be mailed directly to the patient.

RECOMMENDATION:
Screening mammogram in one year. (Code:55-L-23V)

BI-RADS CATEGORY  1: Negative.

## 2013-06-29 ENCOUNTER — Telehealth: Payer: Self-pay | Admitting: Pulmonary Disease

## 2013-06-29 NOTE — Telephone Encounter (Signed)
[id number] x1 

## 2013-06-29 NOTE — Telephone Encounter (Signed)
Spoke with the pt's daughter  She is asking for name of arthritis med prescribed in the past I advised looks like mobic is what SN gave her  I advised she should ask ortho to refill this, as she just saw him yesterday and will f/u with him re arthritis pain I informed her SN is retiring is April  She will let the pt know and call if needs referral to new PCP

## 2013-06-30 ENCOUNTER — Telehealth: Payer: Self-pay | Admitting: Pulmonary Disease

## 2013-06-30 MED ORDER — MELOXICAM 7.5 MG PO TABS: 7.5000 mg | ORAL_TABLET | Freq: Every day | ORAL | Status: DC

## 2013-06-30 NOTE — Telephone Encounter (Signed)
Spoke with Curt Bears. She reports pt needs mobic for her arthritis. We gave this some time ago but does not currently take this. This worked well for pt arthritis. Was told by ED one point in time to stop this but not sure why. Pt see's ortho but doesn't;t prescribe this for her and also is out of town so can;t do anything right now. She wants SN to refill this for her.  Please advise SN thanks

## 2013-06-30 NOTE — Telephone Encounter (Signed)
Per SN---  Ok to send in the mobic 7.5 mg  #30  1 daily as needed for arthritis pain.  Called and spoke with Curt Bears and she is aware that this has been sent to the pharmacy.

## 2013-07-21 ENCOUNTER — Telehealth: Payer: Self-pay | Admitting: Pulmonary Disease

## 2013-07-21 MED ORDER — DICYCLOMINE HCL 10 MG PO CAPS
ORAL_CAPSULE | ORAL | Status: DC
Start: 2013-07-21 — End: 2013-12-18

## 2013-07-21 MED ORDER — ALPRAZOLAM 0.5 MG PO TABS: ORAL_TABLET | ORAL | Status: DC

## 2013-07-21 MED ORDER — FUROSEMIDE 40 MG PO TABS: ORAL_TABLET | ORAL | Status: DC

## 2013-07-21 NOTE — Telephone Encounter (Signed)
Called and spoke with the pharmacy and these meds have been called into the pharmacy and nothing further is needed.

## 2013-07-21 NOTE — Telephone Encounter (Signed)
Called and spoke with pharmacy on [address].   They stated that the pt changed from [facility] to them and needed the dicyclomine refilled.  This has been called into them.  Nothing further is needed.

## 2013-07-24 ENCOUNTER — Ambulatory Visit (INDEPENDENT_AMBULATORY_CARE_PROVIDER_SITE_OTHER): Payer: Medicare Other | Admitting: Gynecology

## 2013-07-24 ENCOUNTER — Encounter: Payer: Self-pay | Admitting: Gynecology

## 2013-07-24 VITALS — BP 136/84

## 2013-07-24 DIAGNOSIS — Z4689 Encounter for fitting and adjustment of other specified devices: Secondary | ICD-10-CM

## 2013-07-24 DIAGNOSIS — N814 Uterovaginal prolapse, unspecified: Secondary | ICD-10-CM

## 2013-07-24 DIAGNOSIS — N8111 Cystocele, midline: Secondary | ICD-10-CM

## 2013-07-24 DIAGNOSIS — IMO0002 Reserved for concepts with insufficient information to code with codable children: Secondary | ICD-10-CM

## 2013-07-24 NOTE — Progress Notes (Signed)
   Patient is a 79 year old who presented to the office today for pessary maintenance. Patient has a history of second-degree cystocele along with first degree uterine prolapse with no stress incontinence. She has done well with the doughnut pessary 2-1/2 inch along with application of vaginal estrogen twice a week.  Exam: Bartholin urethra Skene was within normal limits Vagina with no erosion as a result of the pessary noted. Vagina: First to second-degree cystocele with first-degree uterine prolapse, no rectocele ,no cervical lesion.   The patient had been removed with cleansed and replaced. She'll return back in 3-4 months for maintenance. She denies any urinary incontinence and has done well with the pessary.

## 2013-08-01 ENCOUNTER — Telehealth: Payer: Self-pay | Admitting: Pulmonary Disease

## 2013-08-01 MED ORDER — COLESEVELAM HCL 625 MG PO TABS: 1250.0000 mg | ORAL_TABLET | Freq: Every day | ORAL | Status: DC

## 2013-08-01 NOTE — Telephone Encounter (Signed)
Rx has been sent to [facility]. Nothing further is needed.

## 2013-08-03 ENCOUNTER — Telehealth: Payer: Self-pay | Admitting: Pulmonary Disease

## 2013-08-03 DIAGNOSIS — I10 Essential (primary) hypertension: Secondary | ICD-10-CM

## 2013-08-03 DIAGNOSIS — E559 Vitamin D deficiency, unspecified: Secondary | ICD-10-CM

## 2013-08-03 DIAGNOSIS — E119 Type 2 diabetes mellitus without complications: Secondary | ICD-10-CM

## 2013-08-08 NOTE — Telephone Encounter (Signed)
Called and spoke with pt and she is aware of SN no longer seeing primary care as of April 1.  She has requested that she be set up with new primary care at the [place] office.  Referral has been placed for this and requested that appt be scheduled for the end of July 2015 for the pt.

## 2013-09-14 ENCOUNTER — Ambulatory Visit (INDEPENDENT_AMBULATORY_CARE_PROVIDER_SITE_OTHER): Payer: Medicare Other | Admitting: Internal Medicine

## 2013-09-14 ENCOUNTER — Other Ambulatory Visit (INDEPENDENT_AMBULATORY_CARE_PROVIDER_SITE_OTHER): Payer: Medicare Other

## 2013-09-14 ENCOUNTER — Encounter: Payer: Self-pay | Admitting: Internal Medicine

## 2013-09-14 VITALS — BP 134/82 | HR 80 | Temp 98.1°F | Resp 16 | Wt 184.4 lb

## 2013-09-14 DIAGNOSIS — Z0000 Encounter for general adult medical examination without abnormal findings: Secondary | ICD-10-CM

## 2013-09-14 DIAGNOSIS — E785 Hyperlipidemia, unspecified: Secondary | ICD-10-CM

## 2013-09-14 DIAGNOSIS — E1165 Type 2 diabetes mellitus with hyperglycemia: Secondary | ICD-10-CM

## 2013-09-14 DIAGNOSIS — I2510 Atherosclerotic heart disease of native coronary artery without angina pectoris: Secondary | ICD-10-CM

## 2013-09-14 DIAGNOSIS — I10 Essential (primary) hypertension: Secondary | ICD-10-CM

## 2013-09-14 DIAGNOSIS — E781 Pure hyperglyceridemia: Secondary | ICD-10-CM

## 2013-09-14 DIAGNOSIS — IMO0001 Reserved for inherently not codable concepts without codable children: Secondary | ICD-10-CM

## 2013-09-14 DIAGNOSIS — E559 Vitamin D deficiency, unspecified: Secondary | ICD-10-CM

## 2013-09-14 DIAGNOSIS — Z23 Encounter for immunization: Secondary | ICD-10-CM

## 2013-09-14 LAB — BASIC METABOLIC PANEL
BUN: 10 mg/dL (ref 6–23)
CALCIUM: 9.7 mg/dL (ref 8.4–10.5)
CHLORIDE: 100 meq/L (ref 96–112)
CO2: 29 mEq/L (ref 19–32)
CREATININE: 0.9 mg/dL (ref 0.4–1.2)
GFR: 67.15 mL/min (ref >60.00)
Glucose, Bld: 131 mg/dL — ABNORMAL HIGH (ref 70–99)
Potassium: 3.5 mEq/L (ref 3.5–5.1)
Sodium: 139 mEq/L (ref 135–145)

## 2013-09-14 LAB — TSH: TSH: 3.68 u[IU]/mL (ref 0.35–5.50)

## 2013-09-14 LAB — LIPID PANEL
Cholesterol: 165 mg/dL (ref 0–200)
HDL: 49.3 mg/dL (ref >39.00)
LDL CALC: 82 mg/dL (ref 0–99)
Total CHOL/HDL Ratio: 3
Triglycerides: 169 mg/dL — ABNORMAL HIGH (ref 0.0–149.0)
VLDL: 33.8 mg/dL (ref 0.0–40.0)

## 2013-09-14 LAB — HEMOGLOBIN A1C: HEMOGLOBIN A1C: 6.7 % — AB (ref 4.6–6.5)

## 2013-09-14 MED ORDER — ALBUTEROL SULFATE HFA 108 (90 BASE) MCG/ACT IN AERS: 1.0000 | INHALATION_SPRAY | RESPIRATORY_TRACT | Status: DC | PRN

## 2013-09-14 MED ORDER — FLUTICASONE PROPIONATE 50 MCG/ACT NA SUSP: NASAL | Status: DC

## 2013-09-14 NOTE — Assessment & Plan Note (Signed)
She is doing well on zocor

## 2013-09-14 NOTE — Progress Notes (Signed)
Pre visit review using our clinic review tool, if applicable. No additional management support is needed unless otherwise documented below in the visit note. 

## 2013-09-14 NOTE — Assessment & Plan Note (Signed)
Her BP is well controlled 

## 2013-09-14 NOTE — Assessment & Plan Note (Signed)
I have asked her to schedule a f/up with cardiology about this

## 2013-09-14 NOTE — Assessment & Plan Note (Signed)
She has improved on her lifestyle modifications I will recheck her trigs today, will treat if >500

## 2013-09-14 NOTE — Progress Notes (Signed)
   Subjective:    Patient ID: Nichole Silva, female    DOB: 05/03/1935, 79 y.o.   MRN: [id number]  Hypertension This is a chronic problem. The current episode started more than 1 year ago. The problem is unchanged. The problem is controlled. Associated symptoms include anxiety. Pertinent negatives include no blurred vision, chest pain, headaches, malaise/fatigue, neck pain, orthopnea, palpitations, peripheral edema, PND, shortness of breath or sweats. Agents associated with hypertension include NSAIDs. Past treatments include calcium channel blockers and diuretics. The current treatment provides moderate improvement. Compliance problems include exercise and diet.  Hypertensive end-organ damage includes CAD/MI.      Review of Systems  Constitutional: Negative.  Negative for fever, chills, malaise/fatigue, diaphoresis, appetite change and fatigue.  HENT: Negative.   Eyes: Negative.  Negative for blurred vision.  Respiratory: Negative.  Negative for cough, choking, chest tightness, shortness of breath and stridor.   Cardiovascular: Negative.  Negative for chest pain, palpitations, orthopnea, leg swelling and PND.  Gastrointestinal: Negative.  Negative for nausea, vomiting, abdominal pain, diarrhea, constipation and blood in stool.  Endocrine: Negative.   Genitourinary: Negative.   Musculoskeletal: Positive for arthralgias (knees). Negative for back pain, gait problem, joint swelling, myalgias, neck pain and neck stiffness.  Skin: Negative.   Allergic/Immunologic: Negative.   Neurological: Negative.  Negative for dizziness and headaches.  Hematological: Negative.  Negative for adenopathy. Does not bruise/bleed easily.  Psychiatric/Behavioral: Negative.        Objective:   Physical Exam  Vitals reviewed. Constitutional: She is oriented to person, place, and time. She appears well-developed and well-nourished. No distress.  HENT:  Head: Normocephalic and atraumatic.  Mouth/Throat:  Oropharynx is clear and moist. No oropharyngeal exudate.  Eyes: Conjunctivae are normal. Right eye exhibits no discharge. Left eye exhibits no discharge. No scleral icterus.  Neck: Normal range of motion. Neck supple. No JVD present. No tracheal deviation present. No thyromegaly present.  Cardiovascular: Normal rate, regular rhythm, normal heart sounds and intact distal pulses.  Exam reveals no gallop and no friction rub.   No murmur heard. Pulmonary/Chest: Breath sounds normal. No stridor. No respiratory distress. She has no wheezes. She has no rales. She exhibits no tenderness.  Abdominal: Soft. Bowel sounds are normal. She exhibits no distension and no mass. There is no tenderness. There is no rebound and no guarding.  Musculoskeletal: Normal range of motion. She exhibits no edema and no tenderness.  Lymphadenopathy:    She has no cervical adenopathy.  Neurological: She is oriented to person, place, and time.  Skin: Skin is warm and dry. No rash noted. She is not diaphoretic. No erythema. No pallor.  Psychiatric: She has a normal mood and affect. Her behavior is normal. Judgment and thought content normal.     Lab Results  Component Value Date   WBC 8.2 06/12/2013   HGB 14.4 06/12/2013   HCT 42.7 06/12/2013   PLT 277.0 06/12/2013   GLUCOSE 127* 06/12/2013   CHOL 220* 06/12/2013   TRIG 357.0* 06/12/2013   HDL 48.10 06/12/2013   LDLDIRECT 109.3 06/12/2013   LDLCALC 85 06/13/2012   ALT 15 06/12/2013   AST 18 06/12/2013   NA 138 06/12/2013   K 4.9 06/12/2013   CL 99 06/12/2013   CREATININE 0.9 06/12/2013   BUN 9 06/12/2013   CO2 30 06/12/2013   TSH 2.92 06/12/2013   HGBA1C 6.9* 06/12/2013   MICROALBUR 1.27 06/12/2013       Assessment & Plan:

## 2013-09-14 NOTE — Assessment & Plan Note (Signed)
I will recheck her A1C and will monitor her renal function

## 2013-09-14 NOTE — Assessment & Plan Note (Addendum)

## 2013-09-14 NOTE — Assessment & Plan Note (Signed)
I will recheck her Vit D level today 

## 2013-09-14 NOTE — Patient Instructions (Signed)
Type 2 Diabetes Mellitus, Adult Type 2 diabetes mellitus, often simply referred to as type 2 diabetes, is a long-lasting (chronic) disease. In type 2 diabetes, the pancreas does not make enough insulin (a hormone), the cells are less responsive to the insulin that is made (insulin resistance), or both. Normally, insulin moves sugars from food into the tissue cells. The tissue cells use the sugars for energy. The lack of insulin or the lack of normal response to insulin causes excess sugars to build up in the blood instead of going into the tissue cells. As a result, high blood sugar (hyperglycemia) develops. The effect of high sugar (glucose) levels can cause many complications. Type 2 diabetes was also previously called adult-onset diabetes but it can occur at any age.  RISK FACTORS  A person is predisposed to developing type 2 diabetes if someone in the family has the disease and also has one or more of the following primary risk factors:  Overweight.  An inactive lifestyle.  A history of consistently eating high-calorie foods. Maintaining a normal weight and regular physical activity can reduce the chance of developing type 2 diabetes. SYMPTOMS  A person with type 2 diabetes may not show symptoms initially. The symptoms of type 2 diabetes appear slowly. The symptoms include:  Increased thirst (polydipsia).  Increased urination (polyuria).  Increased urination during the night (nocturia).  Weight loss. This weight loss may be rapid.  Frequent, recurring infections.  Tiredness (fatigue).  Weakness.  Vision changes, such as blurred vision.  Fruity smell to your breath.  Abdominal pain.  Nausea or vomiting.  Cuts or bruises which are slow to heal.  Tingling or numbness in the hands or feet. DIAGNOSIS Type 2 diabetes is frequently not diagnosed until complications of diabetes are present. Type 2 diabetes is diagnosed when symptoms or complications are present and when blood  glucose levels are increased. Your blood glucose level may be checked by one or more of the following blood tests:  A fasting blood glucose test. You will not be allowed to eat for at least 8 hours before a blood sample is taken.  A random blood glucose test. Your blood glucose is checked at any time of the day regardless of when you ate.  A hemoglobin A1c blood glucose test. A hemoglobin A1c test provides information about blood glucose control over the previous 3 months.  An oral glucose tolerance test (OGTT). Your blood glucose is measured after you have not eaten (fasted) for 2 hours and then after you drink a glucose-containing beverage. TREATMENT   You may need to take insulin or diabetes medicine daily to keep blood glucose levels in the desired range.  You will need to match insulin dosing with exercise and healthy food choices. The treatment goal is to maintain the before meal blood sugar (preprandial glucose) level at 70 130 mg/dL. HOME CARE INSTRUCTIONS   Have your hemoglobin A1c level checked twice a year.  Perform daily blood glucose monitoring as directed by your caregiver.  Monitor urine ketones when you are ill and as directed by your caregiver.  Take your diabetes medicine or insulin as directed by your caregiver to maintain your blood glucose levels in the desired range.  Never run out of diabetes medicine or insulin. It is needed every day.  Adjust insulin based on your intake of carbohydrates. Carbohydrates can raise blood glucose levels but need to be included in your diet. Carbohydrates provide vitamins, minerals, and fiber which are an essential part of   a healthy diet. Carbohydrates are found in fruits, vegetables, whole grains, dairy products, legumes, and foods containing added sugars.    Eat healthy foods. Alternate 3 meals with 3 snacks.  Lose weight if overweight.  Carry a medical alert card or wear your medical alert jewelry.  Carry a 15 gram  carbohydrate snack with you at all times to treat low blood glucose (hypoglycemia). Some examples of 15 gram carbohydrate snacks include:  Glucose tablets, 3 or 4   Glucose gel, 15 gram tube  Raisins, 2 tablespoons (24 grams)  Jelly beans, 6  Animal crackers, 8  Regular pop, 4 ounces (120 mL)  Gummy treats, 9  Recognize hypoglycemia. Hypoglycemia occurs with blood glucose levels of 70 mg/dL and below. The risk for hypoglycemia increases when fasting or skipping meals, during or after intense exercise, and during sleep. Hypoglycemia symptoms can include:  Tremors or shakes.  Decreased ability to concentrate.  Sweating.  Increased heart rate.  Headache.  Dry mouth.  Hunger.  Irritability.  Anxiety.  Restless sleep.  Altered speech or coordination.  Confusion.  Treat hypoglycemia promptly. If you are alert and able to safely swallow, follow the 15:15 rule:  Take 15 20 grams of rapid-acting glucose or carbohydrate. Rapid-acting options include glucose gel, glucose tablets, or 4 ounces (120 mL) of fruit juice, regular soda, or low fat milk.  Check your blood glucose level 15 minutes after taking the glucose.  Take 15 20 grams more of glucose if the repeat blood glucose level is still 70 mg/dL or below.  Eat a meal or snack within 1 hour once blood glucose levels return to normal.    Be alert to polyuria and polydipsia which are early signs of hyperglycemia. An early awareness of hyperglycemia allows for prompt treatment. Treat hyperglycemia as directed by your caregiver.  Engage in at least 150 minutes of moderate-intensity physical activity a week, spread over at least 3 days of the week or as directed by your caregiver. In addition, you should engage in resistance exercise at least 2 times a week or as directed by your caregiver.  Adjust your medicine and food intake as needed if you start a new exercise or sport.  Follow your sick day plan at any time you  are unable to eat or drink as usual.  Avoid tobacco use.  Limit alcohol intake to no more than 1 drink per day for nonpregnant women and 2 drinks per day for men. You should drink alcohol only when you are also eating food. Talk with your caregiver whether alcohol is safe for you. Tell your caregiver if you drink alcohol several times a week.  Follow up with your caregiver regularly.  Schedule an eye exam soon after the diagnosis of type 2 diabetes and then annually.  Perform daily skin and foot care. Examine your skin and feet daily for cuts, bruises, redness, nail problems, bleeding, blisters, or sores. A foot exam by a caregiver should be done annually.  Brush your teeth and gums at least twice a day and floss at least once a day. Follow up with your dentist regularly.  Share your diabetes management plan with your workplace or school.  Stay up-to-date with immunizations.  Learn to manage stress.  Obtain ongoing diabetes education and support as needed.  Participate in, or seek rehabilitation as needed to maintain or improve independence and quality of life. Request a physical or occupational therapy referral if you are having foot or hand numbness or difficulties with grooming,   dressing, eating, or physical activity. SEEK MEDICAL CARE IF:   You are unable to eat food or drink fluids for more than 6 hours.  You have nausea and vomiting for more than 6 hours.  Your blood glucose level is over 240 mg/dL.  There is a change in mental status.  You develop an additional serious illness.  You have diarrhea for more than 6 hours.  You have been sick or have had a fever for a couple of days and are not getting better.  You have pain during any physical activity.  SEEK IMMEDIATE MEDICAL CARE IF:  You have difficulty breathing.  You have moderate to large ketone levels. MAKE SURE YOU:  Understand these instructions.  Will watch your condition.  Will get help right away if  you are not doing well or get worse. Document Released: 05/11/2005 Document Revised: 02/03/2012 Document Reviewed: 12/08/2011 Exit[employer] Patient Information 2014 [employer].  

## 2013-09-15 ENCOUNTER — Encounter: Payer: Self-pay | Admitting: Internal Medicine

## 2013-09-15 LAB — VITAMIN D 25 HYDROXY (VIT D DEFICIENCY, FRACTURES): VIT D 25 HYDROXY: 38 ng/mL (ref 30–89)

## 2013-09-29 ENCOUNTER — Other Ambulatory Visit: Payer: Self-pay | Admitting: Internal Medicine

## 2013-10-01 ENCOUNTER — Other Ambulatory Visit: Payer: Self-pay | Admitting: Pulmonary Disease

## 2013-10-02 ENCOUNTER — Telehealth: Payer: Self-pay | Admitting: Internal Medicine

## 2013-10-02 NOTE — Telephone Encounter (Signed)
Informed pt that xanax was sent to the pharm via fax

## 2013-10-06 ENCOUNTER — Ambulatory Visit (INDEPENDENT_AMBULATORY_CARE_PROVIDER_SITE_OTHER): Payer: Medicare Other | Admitting: Cardiology

## 2013-10-06 ENCOUNTER — Encounter: Payer: Self-pay | Admitting: Cardiology

## 2013-10-06 VITALS — BP 149/83 | HR 86 | Ht 64.0 in | Wt 183.4 lb

## 2013-10-06 DIAGNOSIS — F411 Generalized anxiety disorder: Secondary | ICD-10-CM

## 2013-10-06 DIAGNOSIS — Z8249 Family history of ischemic heart disease and other diseases of the circulatory system: Secondary | ICD-10-CM | POA: Insufficient documentation

## 2013-10-06 DIAGNOSIS — I10 Essential (primary) hypertension: Secondary | ICD-10-CM

## 2013-10-06 DIAGNOSIS — E785 Hyperlipidemia, unspecified: Secondary | ICD-10-CM

## 2013-10-06 DIAGNOSIS — I2510 Atherosclerotic heart disease of native coronary artery without angina pectoris: Secondary | ICD-10-CM

## 2013-10-06 DIAGNOSIS — F419 Anxiety disorder, unspecified: Secondary | ICD-10-CM | POA: Insufficient documentation

## 2013-10-06 DIAGNOSIS — R9431 Abnormal electrocardiogram [ECG] [EKG]: Secondary | ICD-10-CM | POA: Insufficient documentation

## 2013-10-06 MED ORDER — ATORVASTATIN CALCIUM 40 MG PO TABS
40.0000 mg | ORAL_TABLET | Freq: Every day | ORAL | Status: DC
Start: 2013-10-06 — End: 2014-03-26

## 2013-10-06 NOTE — Progress Notes (Signed)
[facility]. [address] [place], [place]  [address] Phone: [phone] Fax:  [phone]  Date:  10/06/2013   ID:  Nichole Silva, DOB 1935-05-06, MRN [id number]  PCP:  Scarlette Calico, MD   History of Present Illness: Nichole Silva is a 79 y.o. female here for the evaluation of coronary artery disease at the request of Dr. Scarlette Calico.  In review of his note, she has diabetes, hypertension, hyperlipidemia.  Has not been to see Dr. Lia Foyer for several years. At age 14 went to Dr. Lia Foyer. Thought she had a panic attack. Still did not feel right the next morning. Went directly to hospital. Cath. Procardia up to 60. No CAD noted.   Had 2 stress tests (unknown dates). Unremarkable.  Heart disease runs in her family. Sister has MI with 2 stents. 69 year old daughter with 2 stents.   Has been to therapy for anxiety. She thinks she is doing well. No chest pain. No SOB. No strokelike symptoms.   Wt Readings from Last 3 Encounters:  10/06/13 183 lb 6.4 oz (83.19 kg)  09/14/13 184 lb 6 oz (83.632 kg)  06/12/13 186 lb 3.2 oz (84.46 kg)     Past Medical History  Diagnosis Date  . Unspecified hearing loss   . Allergic rhinitis, cause unspecified   . Unspecified chronic bronchitis   . Unspecified essential hypertension   . Coronary atherosclerosis of unspecified type of vessel, native or graft   . Unspecified venous (peripheral) insufficiency   . Type II or unspecified type diabetes mellitus without mention of complication, not stated as uncontrolled   . Osteoarthrosis, unspecified whether generalized or localized, unspecified site   . Unspecified vitamin D deficiency   . Anxiety state, unspecified   . Hyperlipidemia   . IBS (irritable bowel syndrome)   . Asthma   . Depression     Past Surgical History  Procedure Laterality Date  . Dilation and curettage of uterus  1960's  . Cholecystectomy  1997  . Left elbow surgery  1992  . Cataract surg  2009    Current  Outpatient Prescriptions  Medication Sig Dispense Refill  . acetaminophen (TYLENOL) 325 MG tablet Take 325 mg by mouth every 6 (six) hours as needed. Pain/headache      . albuterol (PROAIR HFA) 108 (90 BASE) MCG/ACT inhaler Inhale 1-2 puffs into the lungs every 4 (four) hours as needed for wheezing or shortness of breath.  1 Inhaler  6  . ALPRAZolam (XANAX) 0.5 MG tablet TAKE 1/2 TO 1 TABLET BY MOUTH 3 TIMES A DAY AS NEEDED  90 tablet  1  . aspirin 81 MG tablet Take 81 mg by mouth daily.        . cetirizine (ZYRTEC) 10 MG tablet Take 10 mg by mouth daily.        . colesevelam (WELCHOL) 625 MG tablet Take 2 tablets (1,250 mg total) by mouth daily.  60 tablet  2  . dicyclomine (BENTYL) 10 MG capsule TAKE ONE CAPSULE BY MOUTH THREE TIMES DAILY BEFORE MEALS  90 capsule  3  . ergocalciferol (VITAMIN D2) 50000 UNITS capsule Take 1 capsule (50,000 Units total) by mouth every Monday.  4 capsule  6  . fluticasone (FLONASE) 50 MCG/ACT nasal spray SPRAY TWICE IN EACH NOSTRIL EVERY NIGHT AT BEDTIME  16 g  6  . furosemide (LASIX) 40 MG tablet TAKE 1- 2 TABLET BY MOUTH EVERY DAY  30 tablet  2  .  hydrocortisone cream 1 % Apply 1 application topically 2 (two) times daily.  30 g  6  . NIFEdipine (NIFEDICAL XL) 60 MG 24 hr tablet TAKE 1 TABLET BY MOUTH EVERY DAY  30 tablet  11  . NONFORMULARY OR COMPOUNDED ITEM Place 1 Applicatorful vaginally See admin instructions. Estradiol .02% 1 ML Prefilled Applicator Sig: apply vaginally twice a week #90 Day Supply with 4 refills       No current facility-administered medications for this visit.    Allergies:    Allergies  Allergen Reactions  . Metformin And Related     N&V  . Penicillins     REACTION: ITCHING AND SWELLING    Social History:  The patient  reports that she has never smoked. She has never used smokeless tobacco. She reports that she does not drink alcohol or use illicit drugs. Retired 7/14 - [employer]. Had a granddaughter at [facility] who died of a  drug overdose.  Family History  Problem Relation Age of Onset  . Emphysema Mother   . Heart disease Father   . Colon cancer    . Pancreatic cancer    . Colon cancer Sister     ROS:  Please see the history of present illness.  +Pessary, +Anxiety. Denies any fevers, chills, orthopnea, PND, syncope   All other systems reviewed and negative.   PHYSICAL EXAM: VS:  BP 149/83  Pulse 86  Ht 5\' 4"  (1.626 m)  Wt 183 lb 6.4 oz (83.19 kg)  BMI 31.47 kg/m2 Well nourished, well developed, in no acute distress HEENT: normal, [place]/AT, EOMI Neck: no JVD, normal carotid upstroke, no bruit Cardiac:  normal S1, S2; RRR; no murmur Lungs:  clear to auscultation bilaterally, no wheezing, rhonchi or rales Abd: soft, nontender, no hepatomegaly, no bruits Ext: 3+ chronic edema, compression hose, 2+ radial pulses Skin: warm and dry GU: deferred Neuro: no focal abnormalities noted, AAO x 3  EKG:    10/06/13: Sinus rhythm, 86, nonspecific ST-T wave changes, consider anterolateral ischemia.  No significant change from prior EKG from 06/09/12.  Prior office records reviewed.  Labs: 09/14/13-potassium 3.5, creatinine 0.9, LDL 82, triglycerides 169, hemoglobin 14.4.  ASSESSMENT AND PLAN:  1. Coronary artery disease-She states that she has had a cardiac catheterization in the distant past and did not have any significant CAD. We will continue with primary prevention. Currently do not see any objective evidence of CAD. 2. Diabetes-Dr. Ronnald Ramp 3. Hypertension-medications reviewed, reasonably controlled. 4. Hyperlipidemia-simvastatin 80 mg. I will change her over to atorvastatin 40 mg. on nifedipine. 5. Abnormal EKG-T-wave changes noted. She is asymptomatic. I will continue to monitor clinically. Statin. If symptoms began to occur, we will likely pursue stress testing. 6. Anxiety-currently doing well. 7. Chronic venous insufficiency-compression hose. From years of standing, working at [employer]. 8. Six-month  followup.  Signed, Candee Furbish, MD [facility]  10/06/2013 12:18 PM

## 2013-10-06 NOTE — Patient Instructions (Signed)
STOP SIMVASTATIN   START LIPITOR (ATORVASTATIN) 40 MG DAILY, RX SENT TO PHARMACY  Your physician wants you to follow-up in: [place] will receive a reminder letter in the mail two months in advance. If you don't receive a letter, please call our office to schedule the follow-up appointment.

## 2013-10-23 ENCOUNTER — Other Ambulatory Visit: Payer: Self-pay | Admitting: Pulmonary Disease

## 2013-10-25 ENCOUNTER — Encounter: Payer: Self-pay | Admitting: Gynecology

## 2013-10-25 ENCOUNTER — Ambulatory Visit (INDEPENDENT_AMBULATORY_CARE_PROVIDER_SITE_OTHER): Payer: Medicare Other | Admitting: Gynecology

## 2013-10-25 ENCOUNTER — Other Ambulatory Visit: Payer: Self-pay | Admitting: Pulmonary Disease

## 2013-10-25 VITALS — BP 136/82

## 2013-10-25 DIAGNOSIS — N898 Other specified noninflammatory disorders of vagina: Secondary | ICD-10-CM

## 2013-10-25 DIAGNOSIS — N760 Acute vaginitis: Secondary | ICD-10-CM

## 2013-10-25 DIAGNOSIS — B3731 Acute candidiasis of vulva and vagina: Secondary | ICD-10-CM

## 2013-10-25 DIAGNOSIS — IMO0001 Reserved for inherently not codable concepts without codable children: Secondary | ICD-10-CM | POA: Insufficient documentation

## 2013-10-25 DIAGNOSIS — B9689 Other specified bacterial agents as the cause of diseases classified elsewhere: Secondary | ICD-10-CM

## 2013-10-25 DIAGNOSIS — B373 Candidiasis of vulva and vagina: Secondary | ICD-10-CM

## 2013-10-25 DIAGNOSIS — A499 Bacterial infection, unspecified: Secondary | ICD-10-CM

## 2013-10-25 DIAGNOSIS — Z4689 Encounter for fitting and adjustment of other specified devices: Secondary | ICD-10-CM | POA: Insufficient documentation

## 2013-10-25 DIAGNOSIS — IMO0002 Reserved for concepts with insufficient information to code with codable children: Secondary | ICD-10-CM

## 2013-10-25 DIAGNOSIS — N814 Uterovaginal prolapse, unspecified: Secondary | ICD-10-CM | POA: Insufficient documentation

## 2013-10-25 LAB — WET PREP FOR TRICH, YEAST, CLUE: Trich, Wet Prep: NONE SEEN

## 2013-10-25 MED ORDER — METRONIDAZOLE 500 MG PO TABS: 500.0000 mg | ORAL_TABLET | Freq: Two times a day (BID) | ORAL | Status: DC

## 2013-10-25 MED ORDER — FLUCONAZOLE 150 MG PO TABS: 150.0000 mg | ORAL_TABLET | Freq: Once | ORAL | Status: DC

## 2013-10-25 NOTE — Addendum Note (Signed)
Addended by: Thurnell Garbe A on: 10/25/2013 12:21 PM   Modules accepted: Orders

## 2013-10-25 NOTE — Progress Notes (Signed)
   Patient is a 79 year old who presented to the office today for pessary maintenance. Patient has a history of second-degree cystocele along with first degree uterine prolapse with no stress incontinence. She has done well with the doughnut pessary 2-1/2 inch along with application of vaginal estrogen twice a week.  Exam:  Bartholin urethra Skene was within normal limits  Vagina with no erosion as a result of the pessary noted.  Vagina: First to second-degree cystocele with first-degree uterine prolapse, no rectocele ,no cervical lesion.    a slight white creamy discharge was noted and a wet prep was obtained. Wet prep: Includes cells, many WBC, too numerous to count bacteria, moderate yeast  Her pessary was removed cleansed and replaced back into the vagina. Patient will be prescribed Diflucan 150 mg 1 by mouth daily for vaginal yeast infection. Flagyl 500 mg twice a day for 7 days for bacterial vaginosis. Patient will followup in 3 months.

## 2013-10-25 NOTE — Patient Instructions (Signed)
Monilial Vaginitis  Vaginitis in a soreness, swelling and redness (inflammation) of the vagina and vulva. Monilial vaginitis is not a sexually transmitted infection.  CAUSES   Yeast vaginitis is caused by yeast (candida) that is normally found in your vagina. With a yeast infection, the candida has overgrown in number to a point that upsets the chemical balance.  SYMPTOMS   · White, thick vaginal discharge.  · Swelling, itching, redness and irritation of the vagina and possibly the lips of the vagina (vulva).  · Burning or painful urination.  · Painful intercourse.  DIAGNOSIS   Things that may contribute to monilial vaginitis are:  · Postmenopausal and virginal states.  · Pregnancy.  · Infections.  · Being tired, sick or stressed, especially if you had monilial vaginitis in the past.  · Diabetes. Good control will help lower the chance.  · Birth control pills.  · Tight fitting garments.  · Using bubble bath, feminine sprays, douches or deodorant tampons.  · Taking certain medications that kill germs (antibiotics).  · Sporadic recurrence can occur if you become ill.  TREATMENT   Your caregiver will give you medication.  · There are several kinds of anti monilial vaginal creams and suppositories specific for monilial vaginitis. For recurrent yeast infections, use a suppository or cream in the vagina 2 times a week, or as directed.  · Anti-monilial or steroid cream for the itching or irritation of the vulva may also be used. Get your caregiver's permission.  · Painting the vagina with methylene blue solution may help if the monilial cream does not work.  · Eating yogurt may help prevent monilial vaginitis.  HOME CARE INSTRUCTIONS   · Finish all medication as prescribed.  · Do not have sex until treatment is completed or after your caregiver tells you it is okay.  · Take warm sitz baths.  · Do not douche.  · Do not use tampons, especially scented ones.  · Wear cotton underwear.  · Avoid tight pants and panty  hose.  · Tell your sexual partner that you have a yeast infection. They should go to their caregiver if they have symptoms such as mild rash or itching.  · Your sexual partner should be treated as well if your infection is difficult to eliminate.  · Practice safer sex. Use condoms.  · Some vaginal medications cause latex condoms to fail. Vaginal medications that harm condoms are:  · Cleocin cream.  · Butoconazole (Femstat®).  · Terconazole (Terazol®) vaginal suppository.  · Miconazole (Monistat®) (may be purchased over the counter).  SEEK MEDICAL CARE IF:   · You have a temperature by mouth above 102° F (38.9° C).  · The infection is getting worse after 2 days of treatment.  · The infection is not getting better after 3 days of treatment.  · You develop blisters in or around your vagina.  · You develop vaginal bleeding, and it is not your menstrual period.  · You have pain when you urinate.  · You develop intestinal problems.  · You have pain with sexual intercourse.  Document Released: 02/18/2005 Document Revised: 08/03/2011 Document Reviewed: 11/02/2008  Exit[employer]® Patient Information ©2014 [employer].

## 2013-10-27 ENCOUNTER — Other Ambulatory Visit: Payer: Self-pay | Admitting: Pulmonary Disease

## 2013-10-27 ENCOUNTER — Other Ambulatory Visit: Payer: Self-pay | Admitting: Internal Medicine

## 2013-11-01 ENCOUNTER — Other Ambulatory Visit: Payer: Self-pay

## 2013-11-01 ENCOUNTER — Telehealth: Payer: Self-pay

## 2013-11-01 MED ORDER — ALPRAZOLAM 0.5 MG PO TABS: ORAL_TABLET | ORAL | Status: DC

## 2013-11-01 MED ORDER — COLESEVELAM HCL 625 MG PO TABS
1250.0000 mg | ORAL_TABLET | Freq: Every day | ORAL | Status: DC
Start: 2013-11-01 — End: 2014-05-29

## 2013-11-01 NOTE — Telephone Encounter (Signed)
done

## 2013-11-01 NOTE — Telephone Encounter (Signed)
Please advise if ok to fill xanax for this patient. Thanks

## 2013-12-12 ENCOUNTER — Ambulatory Visit: Payer: Medicare Other | Admitting: Pulmonary Disease

## 2013-12-14 ENCOUNTER — Ambulatory Visit: Payer: Medicare Other | Admitting: Physician Assistant

## 2013-12-18 ENCOUNTER — Other Ambulatory Visit: Payer: Self-pay | Admitting: Pulmonary Disease

## 2013-12-25 ENCOUNTER — Other Ambulatory Visit: Payer: Self-pay | Admitting: Internal Medicine

## 2013-12-26 ENCOUNTER — Ambulatory Visit (INDEPENDENT_AMBULATORY_CARE_PROVIDER_SITE_OTHER): Payer: Medicare Other | Admitting: Internal Medicine

## 2013-12-26 ENCOUNTER — Encounter: Payer: Self-pay | Admitting: Internal Medicine

## 2013-12-26 VITALS — BP 142/90 | HR 90 | Temp 98.2°F | Resp 16 | Ht 64.0 in | Wt 181.0 lb

## 2013-12-26 DIAGNOSIS — E785 Hyperlipidemia, unspecified: Secondary | ICD-10-CM

## 2013-12-26 DIAGNOSIS — Z0000 Encounter for general adult medical examination without abnormal findings: Secondary | ICD-10-CM

## 2013-12-26 DIAGNOSIS — I10 Essential (primary) hypertension: Secondary | ICD-10-CM

## 2013-12-26 DIAGNOSIS — IMO0001 Reserved for inherently not codable concepts without codable children: Secondary | ICD-10-CM

## 2013-12-26 DIAGNOSIS — F411 Generalized anxiety disorder: Secondary | ICD-10-CM

## 2013-12-26 DIAGNOSIS — E1165 Type 2 diabetes mellitus with hyperglycemia: Secondary | ICD-10-CM

## 2013-12-26 DIAGNOSIS — M1990 Unspecified osteoarthritis, unspecified site: Secondary | ICD-10-CM

## 2013-12-26 MED ORDER — LOSARTAN POTASSIUM 50 MG PO TABS: 50.0000 mg | ORAL_TABLET | Freq: Every day | ORAL | Status: DC

## 2013-12-26 NOTE — Progress Notes (Signed)
Pre visit review using our clinic review tool, if applicable. No additional management support is needed unless otherwise documented below in the visit note. 

## 2013-12-26 NOTE — Patient Instructions (Signed)

## 2013-12-26 NOTE — Progress Notes (Signed)
Subjective:    Patient ID: Nichole Silva, female    DOB: [birth year], 79 y.o.   MRN: [id number]  Hypertension This is a chronic problem. The current episode started more than 1 year ago. The problem is unchanged. The problem is uncontrolled. Associated symptoms include anxiety. Pertinent negatives include no blurred vision, chest pain, headaches, malaise/fatigue, neck pain, orthopnea, palpitations, peripheral edema, PND, shortness of breath or sweats. There are no associated agents to hypertension. Past treatments include calcium channel blockers. The current treatment provides moderate improvement. Compliance problems include exercise and diet.   Diabetes Pertinent negatives for hypoglycemia include no dizziness, headaches, seizures, speech difficulty, sweats or tremors. Pertinent negatives for diabetes include no blurred vision, no chest pain, no fatigue, no polydipsia, no polyphagia, no polyuria and no weakness.      Review of Systems  Constitutional: Negative.  Negative for fever, chills, malaise/fatigue, diaphoresis, activity change, appetite change, fatigue and unexpected weight change.  HENT: Negative.   Eyes: Negative.  Negative for blurred vision.  Respiratory: Negative.  Negative for apnea, cough, choking, chest tightness, shortness of breath, wheezing and stridor.   Cardiovascular: Negative.  Negative for chest pain, palpitations, orthopnea, leg swelling and PND.  Gastrointestinal: Negative.  Negative for nausea, vomiting, abdominal pain, diarrhea, constipation and blood in stool.  Endocrine: Negative.  Negative for polydipsia, polyphagia and polyuria.  Genitourinary: Negative.   Musculoskeletal: Negative.  Negative for arthralgias, back pain, myalgias, neck pain and neck stiffness.  Skin: Negative.  Negative for rash.  Allergic/Immunologic: Negative.   Neurological: Negative.  Negative for dizziness, tremors, seizures, syncope, facial asymmetry, speech difficulty, weakness,  light-headedness and headaches.  Hematological: Negative.  Negative for adenopathy. Does not bruise/bleed easily.  Psychiatric/Behavioral: Negative.        Objective:   Physical Exam  Vitals reviewed. Constitutional: She is oriented to person, place, and time. She appears well-developed and well-nourished. No distress.  HENT:  Head: Normocephalic and atraumatic.  Mouth/Throat: Oropharynx is clear and moist. No oropharyngeal exudate.  Eyes: Conjunctivae are normal. Right eye exhibits no discharge. Left eye exhibits no discharge. No scleral icterus.  Neck: Normal range of motion. Neck supple. No JVD present. No tracheal deviation present. No thyromegaly present.  Cardiovascular: Normal rate, regular rhythm, normal heart sounds and intact distal pulses.  Exam reveals no gallop and no friction rub.   No murmur heard. Pulmonary/Chest: Effort normal and breath sounds normal. No accessory muscle usage or stridor. Not tachypneic. No respiratory distress. She has no decreased breath sounds. She has no wheezes. She has no rhonchi. She has no rales. Chest wall is not dull to percussion. She exhibits no mass, no tenderness, no bony tenderness, no laceration, no crepitus, no edema, no deformity, no swelling and no retraction. Right breast exhibits no inverted nipple, no mass, no nipple discharge, no skin change and no tenderness. Left breast exhibits no inverted nipple, no mass, no nipple discharge, no skin change and no tenderness. Breasts are symmetrical.  Abdominal: Soft. Bowel sounds are normal. She exhibits no distension and no mass. There is no tenderness. There is no rebound and no guarding.  Genitourinary: No breast swelling, tenderness, discharge or bleeding. Pelvic exam was performed with patient supine. No labial fusion. There is no rash, tenderness, lesion or injury on the right labia. There is no rash, tenderness, lesion or injury on the left labia.  GU exam not done at her request, she has been  seeing Dr. Toney Rakes frequently in the last few weeks.  Musculoskeletal: Normal  range of motion. She exhibits no edema and no tenderness.  Lymphadenopathy:    She has no cervical adenopathy.  Neurological: She is oriented to person, place, and time.  Skin: Skin is warm and dry. No rash noted. She is not diaphoretic. No erythema. No pallor.  Psychiatric: She has a normal mood and affect. Her behavior is normal. Judgment and thought content normal.     Lab Results  Component Value Date   WBC 8.2 06/12/2013   HGB 14.4 06/12/2013   HCT 42.7 06/12/2013   PLT 277.0 06/12/2013   GLUCOSE 131* 09/14/2013   CHOL 165 09/14/2013   TRIG 169.0* 09/14/2013   HDL 49.30 09/14/2013   LDLDIRECT 109.3 06/12/2013   LDLCALC 82 09/14/2013   ALT 15 06/12/2013   AST 18 06/12/2013   NA 139 09/14/2013   K 3.5 09/14/2013   CL 100 09/14/2013   CREATININE 0.9 09/14/2013   BUN 10 09/14/2013   CO2 29 09/14/2013   TSH 3.68 09/14/2013   HGBA1C 6.7* 09/14/2013   MICROALBUR 1.27 06/12/2013       Assessment & Plan:

## 2013-12-27 ENCOUNTER — Other Ambulatory Visit: Payer: Self-pay | Admitting: Internal Medicine

## 2013-12-27 ENCOUNTER — Telehealth: Payer: Self-pay | Admitting: Internal Medicine

## 2013-12-27 NOTE — Telephone Encounter (Signed)
Relevant patient education mailed to patient.  

## 2013-12-27 NOTE — Assessment & Plan Note (Addendum)

## 2013-12-27 NOTE — Assessment & Plan Note (Signed)
Her BP is not well controlled so I have asked her to add losartan to her regimen

## 2013-12-27 NOTE — Assessment & Plan Note (Signed)
She has achieved her LDL goal 

## 2013-12-27 NOTE — Assessment & Plan Note (Signed)
Her blood sugars have been well controlled

## 2014-01-18 ENCOUNTER — Other Ambulatory Visit: Payer: Self-pay | Admitting: Pulmonary Disease

## 2014-01-24 ENCOUNTER — Other Ambulatory Visit: Payer: Self-pay | Admitting: Internal Medicine

## 2014-01-25 ENCOUNTER — Encounter: Payer: Self-pay | Admitting: Gynecology

## 2014-01-25 ENCOUNTER — Ambulatory Visit (INDEPENDENT_AMBULATORY_CARE_PROVIDER_SITE_OTHER): Payer: Medicare Other | Admitting: Gynecology

## 2014-01-25 VITALS — BP 136/82

## 2014-01-25 DIAGNOSIS — N814 Uterovaginal prolapse, unspecified: Secondary | ICD-10-CM

## 2014-01-25 DIAGNOSIS — Z4689 Encounter for fitting and adjustment of other specified devices: Secondary | ICD-10-CM

## 2014-01-25 NOTE — Progress Notes (Signed)
   Patient is a 79 year old who presented to the office today for pessary maintenance. Patient has a history of second-degree cystocele along with first degree uterine prolapse with no stress incontinence. She has done well with the doughnut pessary 2-1/2 inch along with application of vaginal estrogen twice a week.  Exam:  Bartholin urethra Skene was within normal limits  Vagina with no erosion as a result of the pessary noted.  Vagina: First to second-degree cystocele with first-degree uterine prolapse, no rectocele ,no cervical lesion. No lesions or discharge.   Her pessary was removed cleansed and replaced back into the vagina. She will return back in 3 months for the same and also due for her complete gynecological annual exam.

## 2014-03-26 ENCOUNTER — Encounter: Payer: Self-pay | Admitting: Gynecology

## 2014-03-26 ENCOUNTER — Other Ambulatory Visit: Payer: Self-pay | Admitting: Cardiology

## 2014-04-09 ENCOUNTER — Encounter: Payer: Self-pay | Admitting: Cardiology

## 2014-04-09 ENCOUNTER — Ambulatory Visit (INDEPENDENT_AMBULATORY_CARE_PROVIDER_SITE_OTHER): Payer: Medicare Other | Admitting: Cardiology

## 2014-04-09 VITALS — BP 142/88 | HR 96 | Ht 64.0 in | Wt 180.0 lb

## 2014-04-09 DIAGNOSIS — I2510 Atherosclerotic heart disease of native coronary artery without angina pectoris: Secondary | ICD-10-CM

## 2014-04-09 DIAGNOSIS — I10 Essential (primary) hypertension: Secondary | ICD-10-CM

## 2014-04-09 DIAGNOSIS — Z23 Encounter for immunization: Secondary | ICD-10-CM

## 2014-04-09 DIAGNOSIS — E785 Hyperlipidemia, unspecified: Secondary | ICD-10-CM

## 2014-04-09 NOTE — Patient Instructions (Signed)
The current medical regimen is effective;  continue present plan and medications.  Follow up in 1 year with Dr. Skains.  You will receive a letter in the mail 2 months before you are due.  Please call us when you receive this letter to schedule your follow up appointment.  Thank you for choosing [facility]!!     

## 2014-04-09 NOTE — Progress Notes (Signed)
[place], [place]  [address] Phone: [phone] Fax:  [phone]  Date:  04/09/2014   ID:  Nichole Silva, DOB 03/09/1935, MRN [id number]  PCP:  Scarlette Calico, MD   History of Present Illness: Nichole Silva is a 79 y.o. female here for the follow up of possible coronary artery disease at the request of Dr. Scarlette Calico.   In review of his note, she has diabetes, hypertension, hyperlipidemia. She has an occasional cough for which she takes an inhaler which sometimes makes her tremulous she states.  At age 13 went to Dr. Lia Foyer. Thought she had a panic attack. Still did not feel right the next morning. Went directly to hospital. Cath. Procardia up to 60. No CAD noted.   Had 2 stress tests (unknown dates). Unremarkable.  Heart disease runs in her family. Sister has MI with 2 stents. 48 year old daughter with 2 stents.   Has been to therapy for anxiety. She thinks she is doing well. No chest pain. No SOB. No strokelike symptoms.   Wt Readings from Last 3 Encounters:  04/09/14 180 lb (81.647 kg)  12/26/13 181 lb (82.101 kg)  10/06/13 183 lb 6.4 oz (83.19 kg)     Past Medical History  Diagnosis Date  . Unspecified hearing loss   . Allergic rhinitis, cause unspecified   . Unspecified chronic bronchitis   . Unspecified essential hypertension   . Coronary atherosclerosis of unspecified type of vessel, native or graft   . Unspecified venous (peripheral) insufficiency   . Type II or unspecified type diabetes mellitus without mention of complication, not stated as uncontrolled   . Osteoarthrosis, unspecified whether generalized or localized, unspecified site   . Unspecified vitamin D deficiency   . Anxiety state, unspecified   . Hyperlipidemia   . IBS (irritable bowel syndrome)   . Asthma   . Depression     Past Surgical History  Procedure Laterality Date  . Dilation and curettage of uterus  1960's  . Cholecystectomy  1997  . Left elbow  surgery  1992  . Cataract surg  2009    Current Outpatient Prescriptions  Medication Sig Dispense Refill  . acetaminophen (TYLENOL) 325 MG tablet Take 325 mg by mouth every 6 (six) hours as needed. Pain/headache    . albuterol (PROAIR HFA) 108 (90 BASE) MCG/ACT inhaler Inhale 1-2 puffs into the lungs every 4 (four) hours as needed for wheezing or shortness of breath. 1 Inhaler 6  . ALPRAZolam (XANAX) 0.5 MG tablet TAKE 1/2 TO 1 TABLET BY MOUTH 3 TIMES DAILY AS NEEDED 90 tablet 5  . aspirin 81 MG tablet Take 81 mg by mouth daily.      Marland Kitchen atorvastatin (LIPITOR) 40 MG tablet TAKE 1 TABLET (40 MG TOTAL) BY MOUTH DAILY. 30 tablet 1  . cetirizine (ZYRTEC) 10 MG tablet Take 10 mg by mouth daily.      . colesevelam (WELCHOL) 625 MG tablet Take 2 tablets (1,250 mg total) by mouth daily. 60 tablet 5  . dicyclomine (BENTYL) 10 MG capsule TAKE ONE CAPSULE BY MOUTH 3 TIMES A DAY BEFORE MEALS 90 capsule 3  . fluticasone (FLONASE) 50 MCG/ACT nasal spray SPRAY TWICE IN EACH NOSTRIL EVERY NIGHT AT BEDTIME 16 g 6  . furosemide (LASIX) 40 MG tablet TAKE 1 TO 2 TABLETS BY MOUTH EVERY DAY 30 tablet 11  . hydrocortisone cream 1 % Apply 1 application topically 2 (two) times daily. 30 g  6  . losartan (COZAAR) 50 MG tablet Take 1 tablet (50 mg total) by mouth daily. 90 tablet 3  . NIFEdipine (PROCARDIA XL/ADALAT-CC) 60 MG 24 hr tablet TAKE 1 TABLET BY MOUTH EVERY DAY 30 tablet 4  . NONFORMULARY OR COMPOUNDED ITEM Place 1 Applicatorful vaginally See admin instructions. Estradiol .02% 1 ML Prefilled Applicator Sig: apply vaginally twice a week #90 Day Supply with 4 refills    . Vitamin D, Ergocalciferol, (DRISDOL) 50000 UNITS CAPS capsule TAKE ONE CAPSULE BY MOUTH EVEY MONDAY 4 capsule 11   No current facility-administered medications for this visit.    Allergies:    Allergies  Allergen Reactions  . Metformin And Related     N&V  . Penicillins     REACTION: ITCHING AND SWELLING    Social History:  The  patient  reports that she has never smoked. She has never used smokeless tobacco. She reports that she does not drink alcohol or use illicit drugs. Retired 7/14 - [employer]. Had a granddaughter at [facility] who died of a drug overdose.  Family History  Problem Relation Age of Onset  . Emphysema Mother   . Heart disease Father   . Colon cancer    . Pancreatic cancer    . Colon cancer Sister     ROS:  Please see the history of present illness.  +Pessary, +Anxiety. Denies any fevers, chills, orthopnea, PND, syncope   All other systems reviewed and negative.   PHYSICAL EXAM: VS:  BP 142/88 mmHg  Pulse 96  Ht 5\' 4"  (1.626 m)  Wt 180 lb (81.647 kg)  BMI 30.88 kg/m2 Well nourished, well developed, in no acute distress HEENT: normal, [place]/AT, EOMI Neck: no JVD, normal carotid upstroke, no bruit Cardiac:  normal S1, S2; RRR; no murmur Lungs:  clear to auscultation bilaterally, no wheezing, rhonchi or rales Abd: soft, nontender, no hepatomegaly, no bruits Ext: 1+ chronic edema, no compression hose today, 2+ radial pulses Skin: warm and dry GU: deferred Neuro: no focal abnormalities noted, AAO x 3  EKG:  10/06/13: Sinus rhythm, 86, nonspecific ST-T wave changes, consider anterolateral ischemia.  No significant change from prior EKG from 06/09/12.  Prior office records reviewed.  Labs: 09/14/13-potassium 3.5, creatinine 0.9, LDL 82, triglycerides 169, hemoglobin 14.4.  ASSESSMENT AND PLAN:  1. Coronary artery disease-She states that she has had a cardiac catheterization in the distant past and did not have any significant CAD. We will continue with primary prevention. Currently do not see any objective evidence of CAD. 2. Diabetes-Dr. Ronnald Ramp 3. Hypertension-medications reviewed, Dr. Ronnald Ramp started losartan.  At home about the same as above. May need further increase. She sees him next month.  4. Hyperlipidemia- atorvastatin 40 mg. On nifedipine. 5. Abnormal EKG-T-wave changes noted. She is  asymptomatic. I will continue to monitor clinically. Statin. If symptoms began to occur, we will likely pursue stress testing. 6. Anxiety-currently doing well. 7. Chronic venous insufficiency-compression hose. From years of standing, retired from [employer]. Thinking about going back, she felt better.  8. Flu shot today. 9. 1 year followup.  Signed, Candee Furbish, MD [facility]  04/09/2014 10:04 AM

## 2014-04-27 ENCOUNTER — Ambulatory Visit: Payer: Medicare Other | Admitting: Internal Medicine

## 2014-04-30 ENCOUNTER — Encounter: Payer: Self-pay | Admitting: Gynecology

## 2014-04-30 ENCOUNTER — Ambulatory Visit (INDEPENDENT_AMBULATORY_CARE_PROVIDER_SITE_OTHER): Payer: Medicare Other | Admitting: Gynecology

## 2014-04-30 VITALS — BP 132/80 | Ht 64.5 in | Wt 185.0 lb

## 2014-04-30 DIAGNOSIS — N814 Uterovaginal prolapse, unspecified: Secondary | ICD-10-CM

## 2014-04-30 DIAGNOSIS — Z4689 Encounter for fitting and adjustment of other specified devices: Secondary | ICD-10-CM

## 2014-04-30 DIAGNOSIS — Z780 Asymptomatic menopausal state: Secondary | ICD-10-CM

## 2014-04-30 DIAGNOSIS — Z79890 Hormone replacement therapy (postmenopausal): Secondary | ICD-10-CM

## 2014-04-30 DIAGNOSIS — Z8639 Personal history of other endocrine, nutritional and metabolic disease: Secondary | ICD-10-CM

## 2014-04-30 DIAGNOSIS — N760 Acute vaginitis: Secondary | ICD-10-CM

## 2014-04-30 DIAGNOSIS — N8110 Cystocele, unspecified: Secondary | ICD-10-CM

## 2014-04-30 DIAGNOSIS — B9689 Other specified bacterial agents as the cause of diseases classified elsewhere: Secondary | ICD-10-CM

## 2014-04-30 DIAGNOSIS — IMO0002 Reserved for concepts with insufficient information to code with codable children: Secondary | ICD-10-CM

## 2014-04-30 DIAGNOSIS — N952 Postmenopausal atrophic vaginitis: Secondary | ICD-10-CM

## 2014-04-30 DIAGNOSIS — A499 Bacterial infection, unspecified: Secondary | ICD-10-CM

## 2014-04-30 LAB — WET PREP FOR TRICH, YEAST, CLUE: Trich, Wet Prep: NONE SEEN

## 2014-04-30 MED ORDER — METRONIDAZOLE 500 MG PO TABS
500.0000 mg | ORAL_TABLET | Freq: Two times a day (BID) | ORAL | Status: DC
Start: 2014-04-30 — End: 2014-05-11

## 2014-04-30 NOTE — Patient Instructions (Addendum)

## 2014-04-30 NOTE — Progress Notes (Signed)
Nichole Silva 06-15-1934 [id number]   History:    79 y.o.  who presented to the office for GYN exam and follow-up as well as for pessary maintenance.has a history of second-degree cystocele first-degree uterine prolapse with no stress incontinence was placed on a 2-1/2 inch donut pessary and patient has done well with no complaints of bloating sensation. She had been applying the Estrace vaginal cream twice a week. She also been doing Kegel exercises. Patient is adamant on surgical correction and is happy with the ring pessary patient's primary physician is Dr. Ronnald Ramp who has been treating her for hypertension, hyperlipidemia, venous insufficiency and vitamin D deficiency. Please see problem list for additional details. Patient's shingles, flu, Pneumovax and Tdap vaccines are all up-to-date.Patient states her bone density study was over 3-4 years ago. We cannot find any report from previous study in our system. She is currently taking vitamin D 50,000 units monthly for vitamin D deficiency. Patient's last colonoscopy reportedly normal January 2014 and no history of any prior abnormal colonoscopies or mammogram. Patient with no past history of abnormal Pap smears.  Patient Past medical history,surgical history, family history and social history were all reviewed and documented in the EPIC chart.  Gynecologic History No LMP recorded. Patient is postmenopausal. Contraception: post menopausal status Last Pap: Over 5 years ago. Results were: normal Last mammogram: 2014. Results were: normal  Obstetric History OB History  Gravida Para Term Preterm AB SAB TAB Ectopic Multiple Living  6 4 3 1 2 2    4     # Outcome Date GA Lbr Len/2nd Weight Sex Delivery Anes PTL Lv  6 SAB           5 SAB           4 Term     F Vag-Spont  N Y  3 Term     F Vag-Spont  N Y  2 Term     M Vag-Spont  N Y     Comments: DECEASED  1 Preterm     F Vag-Spont  Y Y       ROS: A ROS was performed and pertinent positives  and negatives are included in the history.  GENERAL: No fevers or chills. HEENT: No change in vision, no earache, sore throat or sinus congestion. NECK: No pain or stiffness. CARDIOVASCULAR: No chest pain or pressure. No palpitations. PULMONARY: No shortness of breath, cough or wheeze. GASTROINTESTINAL: No abdominal pain, nausea, vomiting or diarrhea, melena or bright red blood per rectum. GENITOURINARY: No urinary frequency, urgency, hesitancy or dysuria. MUSCULOSKELETAL: No joint or muscle pain, no back pain, no recent trauma. DERMATOLOGIC: No rash, no itching, no lesions. ENDOCRINE: No polyuria, polydipsia, no heat or cold intolerance. No recent change in weight. HEMATOLOGICAL: No anemia or easy bruising or bleeding. NEUROLOGIC: No headache, seizures, numbness, tingling or weakness. PSYCHIATRIC: No depression, no loss of interest in normal activity or change in sleep pattern.     Exam: chaperone present  BP 132/80 mmHg  Ht 5' 4.5" (1.638 m)  Wt 185 lb (83.915 kg)  BMI 31.28 kg/m2  Body mass index is 31.28 kg/(m^2).  General appearance : Well developed well nourished female. No acute distress HEENT: Neck supple, trachea midline, no carotid bruits, no thyroidmegaly Lungs: Clear to auscultation, no rhonchi or wheezes, or rib retractions  Heart: Regular rate and rhythm, no murmurs or gallops Breast:Examined in sitting and supine position were symmetrical in appearance, no palpable masses or tenderness,  no skin  retraction, no nipple inversion, no nipple discharge, no skin discoloration, no axillary or supraclavicular lymphadenopathy Abdomen: no palpable masses or tenderness, no rebound or guarding Extremities: no edema or skin discoloration or tenderness  Pelvic: Bartholin urethra Skene: Atrophic changes  Vagina: First to second-degree cystocele with first-degree uterine prolapse, no rectocele ,no cervical lesion. There was evidence of vaginal discharge which was slightly thick and  white. Bimanual: Axial small normal size shape and consistency  Adnexa: No palpable masses or tenderness   Anus and perineum  Normal  Rectovaginal  normal sphincter tone without palpated masses or tenderness   Hemoccult PCP provides   Wet prep few clue cells too numerous to count bacteria moderate WBC and positive amine  Assessment/Plan:  79 y.o. female with history of second-degree cystocele and first-degree uterine prolapse with no stress urinary incontinence doing well with 2-1/2 inches doughnut pessary which was clean today. Patient will continue to return to the office every 3-4 months for clean. When she returns in 3 months we will also do her bone density study at same time. For her bacterial vaginosis she will be prescribed Flagyl 500 mg twice a day for 7 days. She will continue with her vaginal estrogen cream but I'm going to ask her to apply once a week instead of twice a week. She will continue to follow-up with her PCP for blood work. Pap smear no longer needed according to the new guidelines.   Terrance Mass MD, 2:00 PM 04/30/2014

## 2014-05-11 ENCOUNTER — Encounter: Payer: Self-pay | Admitting: Internal Medicine

## 2014-05-11 ENCOUNTER — Ambulatory Visit (INDEPENDENT_AMBULATORY_CARE_PROVIDER_SITE_OTHER): Payer: Medicare Other | Admitting: Internal Medicine

## 2014-05-11 ENCOUNTER — Other Ambulatory Visit (INDEPENDENT_AMBULATORY_CARE_PROVIDER_SITE_OTHER): Payer: Medicare Other

## 2014-05-11 VITALS — BP 134/88 | HR 82 | Temp 98.2°F | Ht 64.5 in | Wt 182.0 lb

## 2014-05-11 DIAGNOSIS — E559 Vitamin D deficiency, unspecified: Secondary | ICD-10-CM

## 2014-05-11 DIAGNOSIS — E785 Hyperlipidemia, unspecified: Secondary | ICD-10-CM

## 2014-05-11 DIAGNOSIS — E118 Type 2 diabetes mellitus with unspecified complications: Secondary | ICD-10-CM

## 2014-05-11 DIAGNOSIS — I10 Essential (primary) hypertension: Secondary | ICD-10-CM

## 2014-05-11 DIAGNOSIS — E781 Pure hyperglyceridemia: Secondary | ICD-10-CM

## 2014-05-11 DIAGNOSIS — Z23 Encounter for immunization: Secondary | ICD-10-CM

## 2014-05-11 LAB — BASIC METABOLIC PANEL
BUN: 10 mg/dL (ref 6–23)
CHLORIDE: 102 meq/L (ref 96–112)
CO2: 21 mEq/L (ref 19–32)
Calcium: 9.9 mg/dL (ref 8.4–10.5)
Creatinine, Ser: 0.8 mg/dL (ref 0.4–1.2)
GFR: 70.77 mL/min (ref >60.00)
Glucose, Bld: 150 mg/dL — ABNORMAL HIGH (ref 70–99)
POTASSIUM: 4 meq/L (ref 3.5–5.1)
SODIUM: 139 meq/L (ref 135–145)

## 2014-05-11 LAB — HEMOGLOBIN A1C: HEMOGLOBIN A1C: 7.2 % — AB (ref 4.6–6.5)

## 2014-05-11 LAB — LIPID PANEL
CHOL/HDL RATIO: 4
CHOLESTEROL: 166 mg/dL (ref 0–200)
HDL: 39 mg/dL — ABNORMAL LOW (ref >39.00)
NONHDL: 127
Triglycerides: 247 mg/dL — ABNORMAL HIGH (ref 0.0–149.0)
VLDL: 49.4 mg/dL — AB (ref 0.0–40.0)

## 2014-05-11 LAB — LDL CHOLESTEROL, DIRECT: Direct LDL: 86.2 mg/dL

## 2014-05-11 MED ORDER — PNEUMOCOCCAL VAC POLYVALENT 25 MCG/0.5ML IJ INJ: 0.5000 mL | INJECTION | INTRAMUSCULAR | Status: AC

## 2014-05-11 NOTE — Progress Notes (Signed)
   Subjective:    Patient ID: Nichole Silva, female    DOB: November 17, 1934, 79 y.o.   MRN: [id number]  Hypertension This is a chronic problem. The current episode started more than 1 year ago. The problem is unchanged. The problem is controlled. Associated symptoms include anxiety. Pertinent negatives include no chest pain, malaise/fatigue, neck pain, orthopnea, palpitations, peripheral edema, PND or shortness of breath. There are no associated agents to hypertension. Past treatments include diuretics, calcium channel blockers and angiotensin blockers. The current treatment provides moderate improvement. Compliance problems include exercise and diet.  Hypertensive end-organ damage includes CAD/MI.      Review of Systems  Constitutional: Negative.  Negative for fever, chills, malaise/fatigue, diaphoresis, appetite change and fatigue.  HENT: Negative.   Eyes: Negative.   Respiratory: Negative.  Negative for cough, choking, chest tightness, shortness of breath and stridor.   Cardiovascular: Negative.  Negative for chest pain, palpitations, orthopnea, leg swelling and PND.  Gastrointestinal: Negative.  Negative for nausea, vomiting, abdominal pain, diarrhea, constipation and blood in stool.  Endocrine: Negative.  Negative for polydipsia, polyphagia and polyuria.  Genitourinary: Negative.   Musculoskeletal: Negative.  Negative for neck pain.  Skin: Negative.   Allergic/Immunologic: Negative.   Neurological: Negative.  Negative for dizziness, tremors, syncope, light-headedness and numbness.  Hematological: Negative.  Negative for adenopathy. Does not bruise/bleed easily.  Psychiatric/Behavioral: Negative.        Objective:   Physical Exam  Constitutional: She is oriented to person, place, and time. She appears well-developed and well-nourished. No distress.  HENT:  Head: Normocephalic and atraumatic.  Mouth/Throat: Oropharynx is clear and moist. No oropharyngeal exudate.  Eyes: Conjunctivae are  normal. Right eye exhibits no discharge. Left eye exhibits no discharge. No scleral icterus.  Neck: Normal range of motion. Neck supple. No JVD present. No tracheal deviation present. No thyromegaly present.  Cardiovascular: Normal rate, regular rhythm, normal heart sounds and intact distal pulses.  Exam reveals no gallop and no friction rub.   No murmur heard. Pulmonary/Chest: Effort normal and breath sounds normal. No stridor. No respiratory distress. She has no wheezes. She has no rales. She exhibits no tenderness.  Abdominal: Soft. Bowel sounds are normal. She exhibits no distension and no mass. There is no tenderness. There is no rebound and no guarding.  Musculoskeletal: Normal range of motion. She exhibits no edema or tenderness.  Lymphadenopathy:    She has no cervical adenopathy.  Neurological: She is oriented to person, place, and time.  Skin: Skin is warm and dry. No rash noted. She is not diaphoretic. No erythema. No pallor.  Psychiatric: She has a normal mood and affect. Her behavior is normal. Judgment and thought content normal.  Vitals reviewed.    Lab Results  Component Value Date   WBC 8.2 06/12/2013   HGB 14.4 06/12/2013   HCT 42.7 06/12/2013   PLT 277.0 06/12/2013   GLUCOSE 131* 09/14/2013   CHOL 165 09/14/2013   TRIG 169.0* 09/14/2013   HDL 49.30 09/14/2013   LDLDIRECT 109.3 06/12/2013   LDLCALC 82 09/14/2013   ALT 15 06/12/2013   AST 18 06/12/2013   NA 139 09/14/2013   K 3.5 09/14/2013   CL 100 09/14/2013   CREATININE 0.9 09/14/2013   BUN 10 09/14/2013   CO2 29 09/14/2013   TSH 3.68 09/14/2013   HGBA1C 6.7* 09/14/2013   MICROALBUR 1.27 06/12/2013       Assessment & Plan:

## 2014-05-11 NOTE — Patient Instructions (Signed)

## 2014-05-12 NOTE — Assessment & Plan Note (Signed)
I will recheck her trigs and will treat if indicated

## 2014-05-12 NOTE — Assessment & Plan Note (Signed)
Her blood sugars are well controlled She will cont to work on her lifestyle modifications

## 2014-05-12 NOTE — Assessment & Plan Note (Signed)
Her BP is well controlled Lytes and renal function are stable 

## 2014-05-14 ENCOUNTER — Other Ambulatory Visit: Payer: Medicare Other

## 2014-05-14 DIAGNOSIS — E079 Disorder of thyroid, unspecified: Secondary | ICD-10-CM

## 2014-05-14 NOTE — Addendum Note (Signed)
Addended by: Gala Lewandowsky B on: 05/14/2014 04:41 PM   Modules accepted: Orders

## 2014-05-15 ENCOUNTER — Encounter: Payer: Self-pay | Admitting: Internal Medicine

## 2014-05-15 LAB — VITAMIN D 25 HYDROXY (VIT D DEFICIENCY, FRACTURES): VIT D 25 HYDROXY: 56 ng/mL (ref 30–100)

## 2014-05-22 ENCOUNTER — Other Ambulatory Visit: Payer: Self-pay | Admitting: Pulmonary Disease

## 2014-05-24 ENCOUNTER — Other Ambulatory Visit: Payer: Self-pay | Admitting: Pulmonary Disease

## 2014-05-24 ENCOUNTER — Other Ambulatory Visit: Payer: Self-pay | Admitting: Cardiology

## 2014-05-24 ENCOUNTER — Other Ambulatory Visit: Payer: Self-pay | Admitting: Internal Medicine

## 2014-05-29 ENCOUNTER — Other Ambulatory Visit: Payer: Self-pay | Admitting: Internal Medicine

## 2014-05-29 ENCOUNTER — Other Ambulatory Visit: Payer: Self-pay | Admitting: *Deleted

## 2014-05-29 MED ORDER — COLESEVELAM HCL 625 MG PO TABS: 1250.0000 mg | ORAL_TABLET | Freq: Every day | ORAL | Status: DC

## 2014-05-29 NOTE — Addendum Note (Signed)
Addended by: Janith Lima on: 05/29/2014 07:46 AM   Modules accepted: Orders

## 2014-05-30 ENCOUNTER — Other Ambulatory Visit: Payer: Self-pay | Admitting: Internal Medicine

## 2014-05-31 ENCOUNTER — Other Ambulatory Visit: Payer: Self-pay | Admitting: Pulmonary Disease

## 2014-06-01 ENCOUNTER — Other Ambulatory Visit: Payer: Self-pay | Admitting: Internal Medicine

## 2014-06-21 ENCOUNTER — Other Ambulatory Visit: Payer: Self-pay | Admitting: Internal Medicine

## 2014-06-27 ENCOUNTER — Other Ambulatory Visit: Payer: Self-pay | Admitting: Pulmonary Disease

## 2014-07-25 ENCOUNTER — Other Ambulatory Visit: Payer: Self-pay | Admitting: Internal Medicine

## 2014-07-26 ENCOUNTER — Ambulatory Visit (INDEPENDENT_AMBULATORY_CARE_PROVIDER_SITE_OTHER): Payer: Medicare Other | Admitting: Gynecology

## 2014-07-26 ENCOUNTER — Encounter: Payer: Self-pay | Admitting: Gynecology

## 2014-07-26 VITALS — BP 134/88

## 2014-07-26 DIAGNOSIS — N8111 Cystocele, midline: Secondary | ICD-10-CM | POA: Diagnosis not present

## 2014-07-26 DIAGNOSIS — N814 Uterovaginal prolapse, unspecified: Secondary | ICD-10-CM

## 2014-07-26 DIAGNOSIS — Z4689 Encounter for fitting and adjustment of other specified devices: Secondary | ICD-10-CM

## 2014-07-26 NOTE — Patient Instructions (Signed)
Bone Densitometry   Remember to schedule your bone density  Bone densitometry is a special X-ray that measures your bone density and can be used to help predict your risk of bone fractures. This test is used to determine bone mineral content and density to diagnose osteoporosis. Osteoporosis is the loss of bone that may cause the bone to become weak. Osteoporosis commonly occurs in women entering menopause. However, it may be found in men and in people with other diseases. PREPARATION FOR TEST No preparation necessary. WHO SHOULD BE TESTED?  All women older than 42.  Postmenopausal women (50 to 37) with risk factors for osteoporosis.  People with a previous fracture caused by normal activities.  People with a small body frame (less than 127 poundsor a body mass index [BMI] of less than 21).  People who have a parent with a hip fracture or history of osteoporosis.  People who smoke.  People who have rheumatoid arthritis.  Anyone who engages in excessive alcohol use (more than 3 drinks most days).  Women who experience early menopause. WHEN SHOULD YOU BE RETESTED? Current guidelines suggest that you should wait at least 2 years before doing a bone density test again if your first test was normal.Recent studies indicated that women with normal bone density may be able to wait a few years before needing to repeat a bone density test. You should discuss this with your caregiver.  NORMAL FINDINGS   Normal: less than standard deviation below normal (greater than -1).  Osteopenia: 1 to 2.5 standard deviations below normal (-1 to -2.5).  Osteoporosis: greater than 2.5 standard deviations below normal (less than -2.5). Test results are reported as a "T score" and a "Z score."The T score is a number that compares your bone density with the bone density of healthy, young women.The Z score is a number that compares your bone density with the scores of women who are the same age, gender,  and race.  Ranges for normal findings may vary among different laboratories and hospitals. You should always check with your doctor after having lab work or other tests done to discuss the meaning of your test results and whether your values are considered within normal limits. MEANING OF TEST  Your caregiver will go over the test results with you and discuss the importance and meaning of your results, as well as treatment options and the need for additional tests if necessary. OBTAINING THE TEST RESULTS It is your responsibility to obtain your test results. Ask the lab or department performing the test when and how you will get your results. Document Released: 06/02/2004 Document Revised: 08/03/2011 Document Reviewed: 06/25/2010 [facility] Patient Information 2015 [place], [place]. This information is not intended to replace advice given to you by your health care provider. Make sure you discuss any questions you have with your health care provider.

## 2014-07-26 NOTE — Progress Notes (Signed)
   Patient is a 80 year old who presented to the office today for pessary maintenance. Patient has a history of second-degree cystocele along with first degree uterine prolapse with no stress incontinence. She has done well with the doughnut pessary 2-1/2 inch along with application of vaginal estrogen twice a week.  Exam: Blood pressure 134/88 Bartholin urethra Skene was within normal limits  Vagina with no erosion as a result of the pessary noted.  Vagina: First to second-degree cystocele with first-degree uterine prolapse, no rectocele ,no cervical lesion. No lesions or discharge.   Her pessary was removed cleansed and replaced back into the vagina. She will return back in 3 months for the same. Patient was reminded to schedule her bone density study.

## 2014-07-31 ENCOUNTER — Emergency Department (HOSPITAL_COMMUNITY)
Admission: EM | Admit: 2014-07-31 | Discharge: 2014-07-31 | Disposition: A | Discharge status: Home / Self Care | Payer: Medicare Other | Source: Home / Self Care | Attending: Family Medicine | Admitting: Family Medicine

## 2014-07-31 ENCOUNTER — Encounter (HOSPITAL_COMMUNITY): Payer: Self-pay | Admitting: *Deleted

## 2014-07-31 DIAGNOSIS — J40 Bronchitis, not specified as acute or chronic: Secondary | ICD-10-CM

## 2014-07-31 DIAGNOSIS — J069 Acute upper respiratory infection, unspecified: Secondary | ICD-10-CM | POA: Diagnosis not present

## 2014-07-31 DIAGNOSIS — J45901 Unspecified asthma with (acute) exacerbation: Secondary | ICD-10-CM | POA: Diagnosis not present

## 2014-07-31 MED ORDER — TRIAMCINOLONE ACETONIDE 40 MG/ML IJ SUSP
20.0000 mg | Freq: Once | INTRAMUSCULAR | Status: AC
  Administered 2014-07-31: 20 mg via INTRAMUSCULAR

## 2014-07-31 MED ORDER — TRIAMCINOLONE ACETONIDE 40 MG/ML IJ SUSP
INTRAMUSCULAR | Status: AC
  Filled 2014-07-31: qty 1

## 2014-07-31 MED ORDER — ALBUTEROL SULFATE (2.5 MG/3ML) 0.083% IN NEBU
INHALATION_SOLUTION | RESPIRATORY_TRACT | Status: AC
  Filled 2014-07-31: qty 6

## 2014-07-31 MED ORDER — CEFUROXIME AXETIL 250 MG PO TABS: 250.0000 mg | ORAL_TABLET | Freq: Two times a day (BID) | ORAL | Status: DC

## 2014-07-31 MED ORDER — IPRATROPIUM BROMIDE 0.02 % IN SOLN
0.2500 mg | Freq: Once | RESPIRATORY_TRACT | Status: AC
  Administered 2014-07-31: 0.25 mg via RESPIRATORY_TRACT

## 2014-07-31 MED ORDER — IPRATROPIUM BROMIDE 0.02 % IN SOLN
RESPIRATORY_TRACT | Status: AC
  Filled 2014-07-31: qty 2.5

## 2014-07-31 MED ORDER — ALBUTEROL SULFATE (5 MG/ML) 0.5% IN NEBU
5.0000 mg | INHALATION_SOLUTION | Freq: Once | RESPIRATORY_TRACT | Status: AC
  Administered 2014-07-31: 5 mg via RESPIRATORY_TRACT

## 2014-07-31 MED ORDER — PREDNISONE 20 MG PO TABS: ORAL_TABLET | ORAL | Status: DC

## 2014-07-31 NOTE — ED Provider Notes (Signed)
CSN: [id number]     Arrival date & time 07/31/14  1900 History   First MD Initiated Contact with Patient 07/31/14 2005     Chief Complaint  Patient presents with  . Fever  . Wheezing  . Cough   (Consider location/radiation/quality/duration/timing/severity/associated sxs/prior Treatment) HPI Comments: Generally well appearing 80 year old female is complaining of a loose cough, wheeze for one week. She states that after she coughs she is experiencing pain across her bilateral shoulder blades. She denies upper respiratory congestion such as drainage, sore throat or nasal congestion. She is currently treating herself with the hand-held albuterol with modest relief.   Past Medical History  Diagnosis Date  . Unspecified hearing loss   . Allergic rhinitis, cause unspecified   . Unspecified chronic bronchitis   . Unspecified essential hypertension   . Coronary atherosclerosis of unspecified type of vessel, native or graft   . Unspecified venous (peripheral) insufficiency   . Type II or unspecified type diabetes mellitus without mention of complication, not stated as uncontrolled   . Osteoarthrosis, unspecified whether generalized or localized, unspecified site   . Unspecified vitamin D deficiency   . Anxiety state, unspecified   . Hyperlipidemia   . IBS (irritable bowel syndrome)   . Asthma   . Depression    Past Surgical History  Procedure Laterality Date  . Dilation and curettage of uterus  1960's  . Cholecystectomy  1997  . Left elbow surgery  1992  . Cataract surg  2009   Family History  Problem Relation Age of Onset  . Emphysema Mother   . Heart disease Father   . Colon cancer    . Pancreatic cancer    . Colon cancer Sister    History  Substance Use Topics  . Smoking status: Never Smoker   . Smokeless tobacco: Never Used  . Alcohol Use: No   OB History    Gravida Para Term Preterm AB TAB SAB Ectopic Multiple Living   6 4 3 1 2  2   4      Review of Systems   Constitutional: Negative for activity change and fatigue.       States has had an occasional fever of 99 at its highest.  HENT: Positive for hearing loss.        As per history of present illness  Respiratory: Positive for cough and wheezing. Negative for shortness of breath.   Cardiovascular: Negative for chest pain and palpitations.  Gastrointestinal: Negative.   Neurological: Negative.     Allergies  Codeine; Metformin and related; and Penicillins  Home Medications   Prior to Admission medications   Medication Sig Start Date End Date Taking? Authorizing Provider  acetaminophen (TYLENOL) 325 MG tablet Take 325 mg by mouth every 6 (six) hours as needed. Pain/headache   Yes Historical Provider, MD  ALPRAZolam (XANAX) 0.5 MG tablet TAKE 1/2 TO 1 TABLET BY MOUTH 3 TIMES A DAY AS NEEDED 06/22/14  Yes Janith Lima, MD  aspirin 81 MG tablet Take 81 mg by mouth daily.     Yes Historical Provider, MD  atorvastatin (LIPITOR) 40 MG tablet TAKE 1 TABLET (40 MG TOTAL) BY MOUTH DAILY. 05/24/14  Yes Jerline Pain, MD  cetirizine (ZYRTEC) 10 MG tablet Take 10 mg by mouth daily.     Yes Historical Provider, MD  dicyclomine (BENTYL) 10 MG capsule TAKE ONE CAPSULE BY MOUTH 3 TIMES A DAY BEFORE MEALS 07/26/14  Yes Janith Lima, MD  fluticasone [facility]) 50  MCG/ACT nasal spray SPRAY TWICE IN EACH NOSTRIL EVERY NIGHT AT BEDTIME 09/14/13  Yes Janith Lima, MD  furosemide (LASIX) 40 MG tablet TAKE 1 TO 2 TABLETS BY MOUTH EVERY DAY 01/24/14  Yes Janith Lima, MD  losartan (COZAAR) 50 MG tablet Take 1 tablet (50 mg total) by mouth daily. 12/26/13  Yes Janith Lima, MD  NIFEdipine (PROCARDIA XL/ADALAT-CC) 60 MG 24 hr tablet TAKE 1 TABLET BY MOUTH EVERY DAY 12/25/13  Yes Janith Lima, MD  NONFORMULARY OR COMPOUNDED ITEM Place 1 Applicatorful vaginally See admin instructions. Estradiol .02% 1 ML Prefilled Applicator Sig: apply vaginally twice a week #90 Day Supply with 4 refills 03/29/13  Yes Terrance Mass, MD  PROAIR HFA 108 (90 BASE) MCG/ACT inhaler INHALE 1-2 PUFFS BY MOUTH EVERY 4 HOURS AS NEEDED FOR WHEEZING OR SHORTNESS OF BREATH. 05/24/14  Yes Janith Lima, MD  Vitamin D, Ergocalciferol, (DRISDOL) 50000 UNITS CAPS capsule TAKE ONE CAPSULE BY MOUTH EVEY MONDAY 01/24/14  Yes Janith Lima, MD  WELCHOL 625 MG tablet TAKE 2 TABLETS BY MOUTH EVERY DAY Patient taking differently: TAKE 1 TABLETS BY MOUTH 2 times daily 05/30/14  Yes Janith Lima, MD  cefUROXime (CEFTIN) 250 MG tablet Take 1 tablet (250 mg total) by mouth 2 (two) times daily with a meal. 07/31/14   Janne Napoleon, NP  hydrocortisone cream 1 % Apply 1 application topically 2 (two) times daily. 06/12/13   Noralee Space, MD  predniSONE (DELTASONE) 20 MG tablet Take 2 tabs po on first day, 2 tabs second day, 2 tabs third day, 1 tab fourth day, 1 tab 5th day. Take with food. Start 08-01-2014 07/31/14   Janne Napoleon, NP   BP 137/78 mmHg  Pulse 94  Temp(Src) 98.9 F (37.2 C) (Oral)  Resp 14  SpO2 95% Physical Exam  Constitutional: She is oriented to person, place, and time. She appears well-developed and well-nourished. No distress.  Patient is sitting on the table. She does not appear to be in  any distress, she is talkative and speaking in complete sentences. No observed respiratory distress.  HENT:  Mouth/Throat: No oropharyngeal exudate.  Bilateral TMs are normal Oropharynx with mild clear PND, otherwise clear.  Eyes: Conjunctivae and EOM are normal.  Neck: Normal range of motion. Neck supple.  Cardiovascular: Normal rate, regular rhythm and normal heart sounds.   Pulmonary/Chest: Effort normal.  Bilateral coarseness and distant expiratory wheeze. Mildly prolonged expiratory phase.  Lymphadenopathy:    She has no cervical adenopathy.  Neurological: She is alert and oriented to person, place, and time. She exhibits normal muscle tone.  Skin: Skin is warm and dry.  Nursing note and vitals reviewed.   ED Course  Procedures  (including critical care time) Labs Review Labs Reviewed - No data to display  Imaging Review No results found.   MDM   1. URI (upper respiratory infection)   2. Asthma exacerbation   3. Bronchitis    Much improvement post nebulizer. Lungs are clear. No coughing. Patient states she feels better Patient received albuterol 0.5 mg and Atrovent 0.25 mg. Discharged with Ceftin 250 twice a day and a low-dose taper prednisone course starting tomorrow. Return for worsening otherwise follow-up with PCP. Continue to use the albuterol HFA when necessary. Kenalog 20 mg IM    Janne Napoleon, NP 07/31/14 [id number]  Janne Napoleon, NP 08/01/14 [phone]

## 2014-07-31 NOTE — Discharge Instructions (Signed)
Asthma Asthma is a condition of the lungs in which the airways tighten and narrow. Asthma can make it hard to breathe. Asthma cannot be cured, but medicine and lifestyle changes can help control it. Asthma may be started (triggered) by:  Animal skin flakes (dander).  Dust.  Cockroaches.  Pollen.  Mold.  Smoke.  Cleaning products.  Hair sprays or aerosol sprays.  Paint fumes or strong smells.  Cold air, weather changes, and winds.  Crying or laughing hard.  Stress.  Certain medicines or drugs.  Foods, such as dried fruit, potato chips, and sparkling grape juice.  Infections or conditions (colds, flu).  Exercise.  Certain medical conditions or diseases.  Exercise or tiring activities. HOME CARE   Take medicine as told by your doctor.  Use a peak flow meter as told by your doctor. A peak flow meter is a tool that measures how well the lungs are working.  Record and keep track of the peak flow meter's readings.  Understand and use the asthma action plan. An asthma action plan is a written plan for taking care of your asthma and treating your attacks.  To help prevent asthma attacks:  Do not smoke. Stay away from secondhand smoke.  Change your heating and air conditioning filter often.  Limit your use of fireplaces and wood stoves.  Get rid of pests (such as roaches and mice) and their droppings.  Throw away plants if you see mold on them.  Clean your floors. Dust regularly. Use cleaning products that do not smell.  Have someone vacuum when you are not home. Use a vacuum cleaner with a HEPA filter if possible.  Replace carpet with wood, tile, or vinyl flooring. Carpet can trap animal skin flakes and dust.  Use allergy-proof pillows, mattress covers, and box spring covers.  Wash bed sheets and blankets every week in hot water and dry them in a dryer.  Use blankets that are made of polyester or cotton.  Clean bathrooms and kitchens with bleach. If  possible, have someone repaint the walls in these rooms with mold-resistant paint. Keep out of the rooms that are being cleaned and painted.  Wash hands often. GET HELP IF:  You have make a whistling sound when breaking (wheeze), have shortness of breath, or have a cough even if taking medicine to prevent attacks.  The colored mucus you cough up (sputum) is thicker than usual.  The colored mucus you cough up changes from clear or white to yellow, green, gray, or bloody.  You have problems from the medicine you are taking such as:  A rash.  Itching.  Swelling.  Trouble breathing.  You need reliever medicines more than 2-3 times a week.  Your peak flow measurement is still at 50-79% of your personal best after following the action plan for 1 hour.  You have a fever. GET HELP RIGHT AWAY IF:   You seem to be worse and are not responding to medicine during an asthma attack.  You are short of breath even at rest.  You get short of breath when doing very little activity.  You have trouble eating, drinking, or talking.  You have chest pain.  You have a fast heartbeat.  Your lips or fingernails start to turn blue.  You are light-headed, dizzy, or faint.  Your peak flow is less than 50% of your personal best. MAKE SURE YOU:   Understand these instructions.  Will watch your condition.  Will get help right away if you  are not doing well or get worse. Document Released: 10/28/2007 Document Revised: 09/25/2013 Document Reviewed: 12/08/2012 [facility] Patient Information 2015 [place], [place]. This information is not intended to replace advice given to you by your health care provider. Make sure you discuss any questions you have with your health care provider.  Upper Respiratory Infection, Adult An upper respiratory infection (URI) is also sometimes known as the common cold. The upper respiratory tract includes the nose, sinuses, throat, trachea, and bronchi. Bronchi are the  airways leading to the lungs. Most people improve within 1 week, but symptoms can last up to 2 weeks. A residual cough may last even longer.  CAUSES Many different viruses can infect the tissues lining the upper respiratory tract. The tissues become irritated and inflamed and often become very moist. Mucus production is also common. A cold is contagious. You can easily spread the virus to others by oral contact. This includes kissing, sharing a glass, coughing, or sneezing. Touching your mouth or nose and then touching a surface, which is then touched by another person, can also spread the virus. SYMPTOMS  Symptoms typically develop 1 to 3 days after you come in contact with a cold virus. Symptoms vary from person to person. They may include:  Runny nose.  Sneezing.  Nasal congestion.  Sinus irritation.  Sore throat.  Loss of voice (laryngitis).  Cough.  Fatigue.  Muscle aches.  Loss of appetite.  Headache.  Low-grade fever. DIAGNOSIS  You might diagnose your own cold based on familiar symptoms, since most people get a cold 2 to 3 times a year. Your caregiver can confirm this based on your exam. Most importantly, your caregiver can check that your symptoms are not due to another disease such as strep throat, sinusitis, pneumonia, asthma, or epiglottitis. Blood tests, throat tests, and X-rays are not necessary to diagnose a common cold, but they may sometimes be helpful in excluding other more serious diseases. Your caregiver will decide if any further tests are required. RISKS AND COMPLICATIONS  You may be at risk for a more severe case of the common cold if you smoke cigarettes, have chronic heart disease (such as heart failure) or lung disease (such as asthma), or if you have a weakened immune system. The very young and very old are also at risk for more serious infections. Bacterial sinusitis, middle ear infections, and bacterial pneumonia can complicate the common cold. The common  cold can worsen asthma and chronic obstructive pulmonary disease (COPD). Sometimes, these complications can require emergency medical care and may be life-threatening. PREVENTION  The best way to protect against getting a cold is to practice good hygiene. Avoid oral or hand contact with people with cold symptoms. Wash your hands often if contact occurs. There is no clear evidence that vitamin C, vitamin E, echinacea, or exercise reduces the chance of developing a cold. However, it is always recommended to get plenty of rest and practice good nutrition. TREATMENT  Treatment is directed at relieving symptoms. There is no cure. Antibiotics are not effective, because the infection is caused by a virus, not by bacteria. Treatment may include:  Increased fluid intake. Sports drinks offer valuable electrolytes, sugars, and fluids.  Breathing heated mist or steam (vaporizer or shower).  Eating chicken soup or other clear broths, and maintaining good nutrition.  Getting plenty of rest.  Using gargles or lozenges for comfort.  Controlling fevers with ibuprofen or acetaminophen as directed by your caregiver.  Increasing usage of your inhaler if you  have asthma. Zinc gel and zinc lozenges, taken in the first 24 hours of the common cold, can shorten the duration and lessen the severity of symptoms. Pain medicines may help with fever, muscle aches, and throat pain. A variety of non-prescription medicines are available to treat congestion and runny nose. Your caregiver can make recommendations and may suggest nasal or lung inhalers for other symptoms.  HOME CARE INSTRUCTIONS   Only take over-the-counter or prescription medicines for pain, discomfort, or fever as directed by your caregiver.  Use a warm mist humidifier or inhale steam from a shower to increase air moisture. This may keep secretions moist and make it easier to breathe.  Drink enough water and fluids to keep your urine clear or pale  yellow.  Rest as needed.  Return to work when your temperature has returned to normal or as your caregiver advises. You may need to stay home longer to avoid infecting others. You can also use a face mask and careful hand washing to prevent spread of the virus. SEEK MEDICAL CARE IF:   After the first few days, you feel you are getting worse rather than better.  You need your caregiver's advice about medicines to control symptoms.  You develop chills, worsening shortness of breath, or brown or red sputum. These may be signs of pneumonia.  You develop yellow or brown nasal discharge or pain in the face, especially when you bend forward. These may be signs of sinusitis.  You develop a fever, swollen neck glands, pain with swallowing, or white areas in the back of your throat. These may be signs of strep throat. SEEK IMMEDIATE MEDICAL CARE IF:   You have a fever.  You develop severe or persistent headache, ear pain, sinus pain, or chest pain.  You develop wheezing, a prolonged cough, cough up blood, or have a change in your usual mucus (if you have chronic lung disease).  You develop sore muscles or a stiff neck. Document Released: 11/04/2000 Document Revised: 08/03/2011 Document Reviewed: 08/16/2013 [facility] Patient Information 2015 [place], [place]. This information is not intended to replace advice given to you by your health care provider. Make sure you discuss any questions you have with your health care provider.

## 2014-07-31 NOTE — ED Notes (Signed)
C/o cough, wheezing, low grade fever x 1 week.  States when she coughs, she feels a heaviness in her back.  She has been using her inhaler every 4 hrs.

## 2014-08-13 ENCOUNTER — Encounter: Payer: Self-pay | Admitting: Gynecology

## 2014-08-23 ENCOUNTER — Other Ambulatory Visit: Payer: Self-pay | Admitting: Internal Medicine

## 2014-08-24 ENCOUNTER — Ambulatory Visit (INDEPENDENT_AMBULATORY_CARE_PROVIDER_SITE_OTHER): Payer: Medicare Other | Admitting: Gynecology

## 2014-08-24 ENCOUNTER — Encounter: Payer: Self-pay | Admitting: Gynecology

## 2014-08-24 VITALS — BP 122/80 | Ht 64.0 in | Wt 178.0 lb

## 2014-08-24 DIAGNOSIS — B9689 Other specified bacterial agents as the cause of diseases classified elsewhere: Secondary | ICD-10-CM

## 2014-08-24 DIAGNOSIS — A499 Bacterial infection, unspecified: Secondary | ICD-10-CM

## 2014-08-24 DIAGNOSIS — N898 Other specified noninflammatory disorders of vagina: Secondary | ICD-10-CM

## 2014-08-24 DIAGNOSIS — N814 Uterovaginal prolapse, unspecified: Secondary | ICD-10-CM

## 2014-08-24 DIAGNOSIS — Z4689 Encounter for fitting and adjustment of other specified devices: Secondary | ICD-10-CM | POA: Diagnosis not present

## 2014-08-24 DIAGNOSIS — N950 Postmenopausal bleeding: Secondary | ICD-10-CM

## 2014-08-24 DIAGNOSIS — N760 Acute vaginitis: Secondary | ICD-10-CM | POA: Diagnosis not present

## 2014-08-24 LAB — WET PREP FOR TRICH, YEAST, CLUE
Trich, Wet Prep: NONE SEEN
WBC, Wet Prep HPF POC: NONE SEEN
YEAST WET PREP: NONE SEEN

## 2014-08-24 MED ORDER — METRONIDAZOLE 500 MG PO TABS: 500.0000 mg | ORAL_TABLET | Freq: Two times a day (BID) | ORAL | Status: DC

## 2014-08-24 NOTE — Addendum Note (Signed)
Addended by: Burnett Kanaris on: 08/24/2014 04:21 PM   Modules accepted: Orders

## 2014-08-24 NOTE — Patient Instructions (Addendum)
Endometrial Biopsy Endometrial biopsy is a procedure in which a tissue sample is taken from inside the uterus. The tissue sample is then looked at under a microscope to see if the tissue is normal or abnormal. The endometrium is the lining of the uterus. This procedure helps determine where you are in your menstrual cycle and how hormone levels are affecting the lining of the uterus. This procedure may also be used to evaluate uterine bleeding or to diagnose endometrial cancer, tuberculosis, polyps, or inflammatory conditions.  LET [facility] CARE PROVIDER KNOW ABOUT:  Any allergies you have.  All medicines you are taking, including vitamins, herbs, eye drops, creams, and over-the-counter medicines.  Previous problems you or members of your family have had with the use of anesthetics.  Any blood disorders you have.  Previous surgeries you have had.  Medical conditions you have.  Possibility of pregnancy. RISKS AND COMPLICATIONS Generally, this is a safe procedure. However, as with any procedure, complications can occur. Possible complications include:  Bleeding.  Pelvic infection.  Puncture of the uterine wall with the biopsy device (rare). BEFORE THE PROCEDURE   Keep a record of your menstrual cycles as directed by your health care provider. You may need to schedule your procedure for a specific time in your cycle.  You may want to bring a sanitary pad to wear home after the procedure.  Arrange for someone to drive you home after the procedure if you will be given a medicine to help you relax (sedative). PROCEDURE   You may be given a sedative to relax you.  You will lie on an exam table with your feet and legs supported as in a pelvic exam.  Your health care provider will insert an instrument (speculum) into your vagina to see your cervix.  Your cervix will be cleansed with an antiseptic solution. A medicine (local anesthetic) will be used to numb the cervix.  A forceps  instrument (tenaculum) will be used to hold your cervix steady for the biopsy.  A thin, rodlike instrument (uterine sound) will be inserted through your cervix to determine the length of your uterus and the location where the biopsy sample will be removed.  A thin, flexible tube (catheter) will be inserted through your cervix and into the uterus. The catheter is used to collect the biopsy sample from your endometrial tissue.  The catheter and speculum will then be removed, and the tissue sample will be sent to a lab for examination. AFTER THE PROCEDURE  You will rest in a recovery area until you are ready to go home.  You may have mild cramping and a small amount of vaginal bleeding for a few days after the procedure. This is normal.  Make sure you find out how to get your test results. Document Released: 09/11/2004 Document Revised: 01/11/2013 Document Reviewed: 10/26/2012 [facility] Patient Information 2015 [place], [place]. This information is not intended to replace advice given to you by your health care provider. Make sure you discuss any questions you have with your health care provider. Transvaginal Ultrasound Transvaginal ultrasound is a pelvic ultrasound, using a metal probe that is placed in the vagina, to look at a women's female organs. Transvaginal ultrasound is a method of seeing inside the pelvis of a woman. The ultrasound machine sends out sound waves from the transducer (probe). These sound waves bounce off body structures (like an echo) to create a picture. The picture shows up on a monitor. It is called transvaginal because the probe is  inserted into the vagina. There should be very little discomfort from the vaginal probe. This test can also be used during pregnancy. Endovaginal ultrasound is another name for a transvaginal ultrasound. In a transabdominal ultrasound, the probe is placed on the outside of the belly. This method gives pictures that are lower quality than pictures  from the transvaginal technique. Transvaginal ultrasound is used to look for problems of the female genital tract. Some such problems include:  Infertility problems.  Congenital (birth defect) malformations of the uterus and ovaries.  Tumors in the uterus.  Abnormal bleeding.  Ovarian tumors and cysts.  Abscess (inflamed tissue around pus) in the pelvis.  Unexplained abdominal or pelvic pain.  Pelvic infection. DURING PREGNANCY, TRANSVAGINAL ULTRASOUND MAY BE USED TO LOOK AT:  Normal pregnancy.  Ectopic pregnancy (pregnancy outside the uterus).  Fetal heartbeat.  Abnormalities in the pelvis, that are not seen well with transabdominal ultrasound.  Suspected twins or multiples.  Impending miscarriage.  Problems with the cervix (incompetent cervix, not able to stay closed and hold the baby).  When doing an amniocentesis (removing fluid from the pregnancy sac, for testing).  Looking for abnormalities of the baby.  Checking the growth, development, and age of the fetus.  Measuring the amount of fluid in the amniotic sac.  When doing an external version of the baby (moving baby into correct position).  Evaluating the baby for problems in high risk pregnancies (biophysical profile).  Suspected fetal demise (death). Sometimes a special ultrasound method called Saline Infusion Sonography (SIS) is used for a more accurate look at the uterus. Sterile saline (salt water) is injected into the uterus of non-pregnant patients to see the inside of the uterus better. SIS is not used on pregnant women. The vaginal probe can also assist in obtaining biopsies of abnormal areas, in draining fluid from cysts on the ovary, and in finding IUDs (intrauterine device, birth control) that cannot be located. PREPARATION FOR TEST A transvaginal ultrasound is done with the bladder empty. The transabdominal ultrasound is done with your bladder full. You may be asked to drink several glasses of water  before that exam. Sometimes, a transabdominal ultrasound is done just after a transvaginal ultrasound, to look at organs in your abdomen. PROCEDURE  You will lie down on a table, with your knees bent and your feet in foot holders. The probe is covered with a condom. A sterile lubricant is put into the vagina and on the probe. The lubricant helps transmit the sound waves and avoid irritating the vagina. Your caregiver will move the probe inside the vaginal cavity to scan the pelvic structures. A normal test will show a normal pelvis and normal contents. An abnormal test will show abnormalities of the pelvis, placenta, or baby. ABNORMAL RESULTS MAY BE DUE TO:  Growths or tumors in the:  Uterus.  Ovaries.  Vagina.  Other pelvic structures.  Non-cancerous growths of the uterus and ovaries.  Twisting of the ovary, cutting off blood supply to the ovary (ovarian torsion).  Areas of infection, including:  Pelvic inflammatory disease.  Abscess in the pelvis.  Locating an IUD. PROBLEMS FOUND IN PREGNANT WOMEN MAY INCLUDE:  Ectopic pregnancy (pregnancy outside the uterus).  Multiple pregnancies.  Early dilation (opening) of the cervix. This may indicate an incompetent cervix and early delivery.  Impending miscarriage.  Fetal death.  Problems with the placenta, including:  Placenta has grown over the opening of the womb (placenta previa).  Placenta has separated early in the womb (placental  abruption).  Placenta grows into the muscle of the uterus (placenta accreta).  Tumors of pregnancy, including gestational trophoblastic disease. This is an abnormal pregnancy, with no fetus. The uterus is filled with many grape-like cysts that could sometimes be cancerous.  Incorrect position of the fetus (breech, vertex).  Intrauterine fetal growth retardation (IUGR) (poor growth in the womb).  Fetal abnormalities or infection. RISKS AND COMPLICATIONS There are no known risks to the  ultrasound procedure. There is no X-ray used when doing an ultrasound. Document Released: 04/22/2004 Document Revised: 08/03/2011 Document Reviewed: 04/10/2009 [facility] Patient Information 2015 [place], [place]. This information is not intended to replace advice given to you by your health care provider. Make sure you discuss any questions you have with your health care provider.

## 2014-08-24 NOTE — Progress Notes (Signed)
   Patient's a 80 year old with known history of second-degree cystocele along with first-degree uterine prolapse but no stress incontinence who is done well with a donut pessary. She presented to the office today stating that last week she noted spotting and then heavy blood come out of her vagina. She has been applying the vaginal estrogen once a week and has come to the office every 3 months for cleaning of her pessary. She was last seen the office doing well on March 3 of this year.  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 Rectal exam not done  Wet prep positive amine, many clue cells, too numerous to count bacteria  Assessment/plan: #1 vaginal discharge with odor bacterial vaginosis identified. Patient will be prescribed Flagyl 500 mg twice a day for 7 days. #2 clinical evidence of bacterial vaginosis we will have patient not insert the pessary until she returns for an ultrasound and endometrial biopsy after infection has been cleared. #3 she will hold off on the estrogen cream.

## 2014-09-05 ENCOUNTER — Encounter: Payer: Self-pay | Admitting: Gynecology

## 2014-09-05 ENCOUNTER — Ambulatory Visit (INDEPENDENT_AMBULATORY_CARE_PROVIDER_SITE_OTHER): Payer: Medicare Other | Admitting: Gynecology

## 2014-09-05 ENCOUNTER — Ambulatory Visit (INDEPENDENT_AMBULATORY_CARE_PROVIDER_SITE_OTHER): Payer: Medicare Other

## 2014-09-05 VITALS — BP 136/78

## 2014-09-05 DIAGNOSIS — R934 Abnormal findings on diagnostic imaging of urinary organs: Secondary | ICD-10-CM

## 2014-09-05 DIAGNOSIS — N950 Postmenopausal bleeding: Secondary | ICD-10-CM | POA: Diagnosis not present

## 2014-09-05 DIAGNOSIS — R9389 Abnormal findings on diagnostic imaging of other specified body structures: Secondary | ICD-10-CM

## 2014-09-05 NOTE — Progress Notes (Signed)
   Patient is a 80 year old who was asked to return to the office today for ultrasound for measurement of her endometrial stripe because of her recent complaint of postmenopausal bleeding. Her history is that she has a second-degree cystocele along with a first-degree uterine prolapse but no stress incontinence who had done well with a donut pessary. She states that after the pessary was removed she has not noted any vaginal bleeding. She had been applying vaginal estrogen once a week. She will come to the office every 3 months for cleansing of the pessary  . Ultrasound today: Uterus measures 6.6 x 5.5 x 4.2 cm with an meter stripe of 5.1 mm. Heterogeneous echo pattern. Cortical cyst calcification walls of the uterus. Endometrium avascular. Right left ovary atrophic and normal. No fluid in the cul-de-sac.  The patient was counseled for an endometrial biopsy. The cervix was cleansed with Betadine solution. A single-tooth tenaculum was placed on the anterior cervical lip and a sterile Pipelle was introduced into the uterine cavity. Uterus sounded to proximally 6 cm. Very little endometrial tissue was obtained and was noted physiological evaluation.  Assessment/plan: Patient with postmenopausal bleeding no further recurrence after pessary removed. We will wait for the result of endometrial biopsy. Patient may return back to the office have pessary and reinserted. She would like to wait for month and continued using vaginal estrogen cream once a week and then return if the prolapse is bothering her.

## 2014-09-06 ENCOUNTER — Other Ambulatory Visit: Payer: Self-pay | Admitting: Gynecology

## 2014-09-06 DIAGNOSIS — N840 Polyp of corpus uteri: Secondary | ICD-10-CM

## 2014-09-06 DIAGNOSIS — N950 Postmenopausal bleeding: Secondary | ICD-10-CM

## 2014-09-12 ENCOUNTER — Other Ambulatory Visit: Payer: Self-pay | Admitting: Gynecology

## 2014-09-12 DIAGNOSIS — N950 Postmenopausal bleeding: Secondary | ICD-10-CM

## 2014-09-25 ENCOUNTER — Ambulatory Visit (INDEPENDENT_AMBULATORY_CARE_PROVIDER_SITE_OTHER): Payer: Medicare Other | Admitting: Internal Medicine

## 2014-09-25 ENCOUNTER — Other Ambulatory Visit (INDEPENDENT_AMBULATORY_CARE_PROVIDER_SITE_OTHER): Payer: Medicare Other

## 2014-09-25 ENCOUNTER — Encounter: Payer: Self-pay | Admitting: Internal Medicine

## 2014-09-25 VITALS — BP 118/78 | HR 86 | Temp 98.3°F | Resp 16 | Ht 64.0 in | Wt 175.0 lb

## 2014-09-25 DIAGNOSIS — E785 Hyperlipidemia, unspecified: Secondary | ICD-10-CM

## 2014-09-25 DIAGNOSIS — E781 Pure hyperglyceridemia: Secondary | ICD-10-CM

## 2014-09-25 DIAGNOSIS — E118 Type 2 diabetes mellitus with unspecified complications: Secondary | ICD-10-CM

## 2014-09-25 DIAGNOSIS — I10 Essential (primary) hypertension: Secondary | ICD-10-CM

## 2014-09-25 LAB — BASIC METABOLIC PANEL
BUN: 8 mg/dL (ref 6–23)
CALCIUM: 10.1 mg/dL (ref 8.4–10.5)
CO2: 29 mEq/L (ref 19–32)
Chloride: 101 mEq/L (ref 96–112)
Creatinine, Ser: 0.83 mg/dL (ref 0.40–1.20)
GFR: 70.7 mL/min (ref >60.00)
Glucose, Bld: 131 mg/dL — ABNORMAL HIGH (ref 70–99)
Potassium: 4.4 mEq/L (ref 3.5–5.1)
Sodium: 137 mEq/L (ref 135–145)

## 2014-09-25 LAB — LIPID PANEL
CHOL/HDL RATIO: 4
Cholesterol: 180 mg/dL (ref 0–200)
HDL: 43.7 mg/dL (ref >39.00)
NonHDL: 136.3
TRIGLYCERIDES: 301 mg/dL — AB (ref 0.0–149.0)
VLDL: 60.2 mg/dL — AB (ref 0.0–40.0)

## 2014-09-25 LAB — LDL CHOLESTEROL, DIRECT: Direct LDL: 74 mg/dL

## 2014-09-25 LAB — HEMOGLOBIN A1C: Hgb A1c MFr Bld: 6.8 % — ABNORMAL HIGH (ref 4.6–6.5)

## 2014-09-25 NOTE — Progress Notes (Signed)
Pre visit review using our clinic review tool, if applicable. No additional management support is needed unless otherwise documented below in the visit note. 

## 2014-09-25 NOTE — Progress Notes (Signed)
Subjective:    Patient ID: Nichole Silva, female    DOB: June 27, 1934, 80 y.o.   MRN: [id number]  Diabetes She presents for her follow-up diabetic visit. She has type 2 diabetes mellitus. Her disease course has been stable. There are no hypoglycemic associated symptoms. Pertinent negatives for hypoglycemia include no dizziness or headaches. Pertinent negatives for diabetes include no blurred vision, no chest pain, no fatigue, no foot paresthesias, no foot ulcerations, no polydipsia, no polyphagia, no polyuria, no visual change, no weakness and no weight loss. There are no hypoglycemic complications. Symptoms are stable. Diabetic complications include heart disease. Current diabetic treatment includes oral agent (monotherapy). She is compliant with treatment most of the time. She is following a generally healthy diet. Meal planning includes avoidance of concentrated sweets. She participates in exercise intermittently. There is no change in her home blood glucose trend. An ACE inhibitor/angiotensin II receptor blocker is being taken. She does not see a podiatrist.Eye exam is current.      Review of Systems  Constitutional: Negative.  Negative for fever, chills, weight loss, diaphoresis, appetite change and fatigue.  HENT: Negative.   Eyes: Negative.  Negative for blurred vision.  Respiratory: Negative.  Negative for cough, choking, chest tightness, shortness of breath and stridor.   Cardiovascular: Negative.  Negative for chest pain, palpitations and leg swelling.  Gastrointestinal: Negative.  Negative for nausea, vomiting, abdominal pain, diarrhea, constipation and blood in stool.  Endocrine: Negative.  Negative for polydipsia, polyphagia and polyuria.  Genitourinary: Negative.   Musculoskeletal: Negative.  Negative for myalgias, back pain, joint swelling and arthralgias.  Skin: Negative.  Negative for rash.  Allergic/Immunologic: Negative.   Neurological: Negative.  Negative for dizziness,  weakness, numbness and headaches.  Hematological: Negative.  Negative for adenopathy. Does not bruise/bleed easily.  Psychiatric/Behavioral: Negative.        Objective:   Physical Exam  Constitutional: She is oriented to person, place, and time. She appears well-developed and well-nourished. No distress.  HENT:  Head: Normocephalic and atraumatic.  Mouth/Throat: Oropharynx is clear and moist. No oropharyngeal exudate.  Eyes: Conjunctivae are normal. Right eye exhibits no discharge. Left eye exhibits no discharge. No scleral icterus.  Neck: Normal range of motion. Neck supple. No JVD present. No tracheal deviation present. No thyromegaly present.  Cardiovascular: Normal rate, regular rhythm, normal heart sounds and intact distal pulses.  Exam reveals no gallop and no friction rub.   No murmur heard. Pulmonary/Chest: Effort normal and breath sounds normal. No stridor. No respiratory distress. She has no wheezes. She has no rales. She exhibits no tenderness.  Abdominal: Soft. Bowel sounds are normal. She exhibits no distension and no mass. There is no tenderness. There is no rebound and no guarding.  Musculoskeletal: Normal range of motion. She exhibits no edema or tenderness.  Lymphadenopathy:    She has no cervical adenopathy.  Neurological: She is oriented to person, place, and time.  Skin: Skin is warm and dry. No rash noted. She is not diaphoretic. No erythema. No pallor.  Vitals reviewed.    Lab Results  Component Value Date   WBC 8.2 06/12/2013   HGB 14.4 06/12/2013   HCT 42.7 06/12/2013   PLT 277.0 06/12/2013   GLUCOSE 150* 05/11/2014   CHOL 166 05/11/2014   TRIG 247.0* 05/11/2014   HDL 39.00* 05/11/2014   LDLDIRECT 86.2 05/11/2014   LDLCALC 82 09/14/2013   ALT 15 06/12/2013   AST 18 06/12/2013   NA 139 05/11/2014   K 4.0 05/11/2014  CL 102 05/11/2014   CREATININE 0.8 05/11/2014   BUN 10 05/11/2014   CO2 21 05/11/2014   TSH 3.68 09/14/2013   HGBA1C 7.2*  05/11/2014   MICROALBUR 1.27 06/12/2013       Assessment & Plan:

## 2014-09-25 NOTE — Patient Instructions (Signed)

## 2014-09-26 NOTE — Assessment & Plan Note (Signed)
She has achieved her LDL goal and is doing well on the statin and welchol

## 2014-09-26 NOTE — Assessment & Plan Note (Signed)
Her BP is well controlled Lytes and renal function are stable 

## 2014-09-26 NOTE — Assessment & Plan Note (Signed)
Improvement noted 

## 2014-09-28 ENCOUNTER — Other Ambulatory Visit: Payer: Self-pay | Admitting: Cardiology

## 2014-10-03 ENCOUNTER — Telehealth: Payer: Self-pay | Admitting: Gynecology

## 2014-10-03 NOTE — Telephone Encounter (Signed)
10/03/14-I spoke with patient to let her know that her [employer] ins will cover the [phone] and [phone] under her $35.00 copay. She is responsible for 20% of the total cost of the [phone] and [phone]) which is $114.62. Her total cost would then be $149.62. She will think about it and call if she needs to reschedule/wl

## 2014-10-07 ENCOUNTER — Encounter: Payer: Self-pay | Admitting: Gynecology

## 2014-10-08 ENCOUNTER — Telehealth: Payer: Self-pay

## 2014-10-08 NOTE — Telephone Encounter (Signed)
Patient sent follow e-mail.  I copied it and sent it to Chauncey Reading. At front desk to assist patient with it.  This does not require a response from you but I wanted to send it to you just FYI.

## 2014-10-10 ENCOUNTER — Ambulatory Visit: Payer: Medicare Other | Admitting: Gynecology

## 2014-10-10 ENCOUNTER — Other Ambulatory Visit: Payer: Medicare Other

## 2014-10-24 ENCOUNTER — Telehealth: Payer: Self-pay

## 2014-10-24 MED ORDER — ALPRAZOLAM 0.5 MG PO TABS: ORAL_TABLET | ORAL | Status: DC

## 2014-10-24 NOTE — Telephone Encounter (Signed)
done

## 2014-10-24 NOTE — Telephone Encounter (Signed)
Received refill request from [facility]  request refills for alprazolam 0.5 mg . Rx last written 06/22/14 and pt last seen 09/25/14  . Please advise Thanks

## 2014-10-26 ENCOUNTER — Ambulatory Visit: Payer: Medicare Other | Admitting: Gynecology

## 2014-11-19 ENCOUNTER — Other Ambulatory Visit: Payer: Self-pay

## 2014-12-31 ENCOUNTER — Other Ambulatory Visit: Payer: Self-pay

## 2014-12-31 ENCOUNTER — Encounter: Payer: Self-pay | Admitting: Internal Medicine

## 2014-12-31 MED ORDER — ALBUTEROL SULFATE HFA 108 (90 BASE) MCG/ACT IN AERS: INHALATION_SPRAY | RESPIRATORY_TRACT | Status: DC

## 2014-12-31 MED ORDER — VITAMIN D (ERGOCALCIFEROL) 1.25 MG (50000 UNIT) PO CAPS: ORAL_CAPSULE | ORAL | Status: DC

## 2015-01-01 ENCOUNTER — Other Ambulatory Visit: Payer: Self-pay

## 2015-01-01 DIAGNOSIS — IMO0002 Reserved for concepts with insufficient information to code with codable children: Secondary | ICD-10-CM

## 2015-01-01 DIAGNOSIS — I10 Essential (primary) hypertension: Secondary | ICD-10-CM

## 2015-01-01 DIAGNOSIS — E1165 Type 2 diabetes mellitus with hyperglycemia: Secondary | ICD-10-CM

## 2015-01-01 MED ORDER — LOSARTAN POTASSIUM 50 MG PO TABS: 50.0000 mg | ORAL_TABLET | Freq: Every day | ORAL | Status: DC

## 2015-01-24 ENCOUNTER — Other Ambulatory Visit: Payer: Self-pay | Admitting: Cardiology

## 2015-01-29 ENCOUNTER — Encounter: Payer: Self-pay | Admitting: Internal Medicine

## 2015-01-29 ENCOUNTER — Other Ambulatory Visit (INDEPENDENT_AMBULATORY_CARE_PROVIDER_SITE_OTHER): Payer: Medicare Other

## 2015-01-29 ENCOUNTER — Ambulatory Visit (INDEPENDENT_AMBULATORY_CARE_PROVIDER_SITE_OTHER): Payer: Medicare Other | Admitting: Internal Medicine

## 2015-01-29 VITALS — BP 130/70 | HR 89 | Temp 98.4°F | Ht 64.0 in | Wt 176.0 lb

## 2015-01-29 DIAGNOSIS — E781 Pure hyperglyceridemia: Secondary | ICD-10-CM

## 2015-01-29 DIAGNOSIS — E785 Hyperlipidemia, unspecified: Secondary | ICD-10-CM | POA: Diagnosis not present

## 2015-01-29 DIAGNOSIS — K589 Irritable bowel syndrome without diarrhea: Secondary | ICD-10-CM

## 2015-01-29 DIAGNOSIS — E118 Type 2 diabetes mellitus with unspecified complications: Secondary | ICD-10-CM | POA: Diagnosis not present

## 2015-01-29 DIAGNOSIS — I2510 Atherosclerotic heart disease of native coronary artery without angina pectoris: Secondary | ICD-10-CM

## 2015-01-29 DIAGNOSIS — I10 Essential (primary) hypertension: Secondary | ICD-10-CM | POA: Diagnosis not present

## 2015-01-29 LAB — CBC WITH DIFFERENTIAL/PLATELET
BASOS ABS: 0 10*3/uL (ref 0.0–0.1)
Basophils Relative: 0.3 % (ref 0.0–3.0)
EOS ABS: 0.5 10*3/uL (ref 0.0–0.7)
Eosinophils Relative: 5.3 % — ABNORMAL HIGH (ref 0.0–5.0)
HEMATOCRIT: 41.9 % (ref 36.0–46.0)
Hemoglobin: 14 g/dL (ref 12.0–15.0)
LYMPHS ABS: 2.5 10*3/uL (ref 0.7–4.0)
LYMPHS PCT: 27.3 % (ref 12.0–46.0)
MCHC: 33.3 g/dL (ref 30.0–36.0)
MCV: 94.7 fl (ref 78.0–100.0)
Monocytes Absolute: 0.8 10*3/uL (ref 0.1–1.0)
Monocytes Relative: 8.2 % (ref 3.0–12.0)
NEUTROS ABS: 5.4 10*3/uL (ref 1.4–7.7)
NEUTROS PCT: 58.9 % (ref 43.0–77.0)
Platelets: 295 10*3/uL (ref 150.0–400.0)
RBC: 4.42 Mil/uL (ref 3.87–5.11)
RDW: 14 % (ref 11.5–15.5)
WBC: 9.1 10*3/uL (ref 4.0–10.5)

## 2015-01-29 LAB — LIPID PANEL
Cholesterol: 217 mg/dL — ABNORMAL HIGH (ref 0–200)
HDL: 50 mg/dL (ref >39.00)
NonHDL: 167.41
Total CHOL/HDL Ratio: 4
Triglycerides: 311 mg/dL — ABNORMAL HIGH (ref 0.0–149.0)
VLDL: 62.2 mg/dL — ABNORMAL HIGH (ref 0.0–40.0)

## 2015-01-29 LAB — BASIC METABOLIC PANEL
BUN: 10 mg/dL (ref 6–23)
CALCIUM: 9.6 mg/dL (ref 8.4–10.5)
CO2: 31 mEq/L (ref 19–32)
Chloride: 100 mEq/L (ref 96–112)
Creatinine, Ser: 0.85 mg/dL (ref 0.40–1.20)
GFR: 68.73 mL/min (ref >60.00)
GLUCOSE: 158 mg/dL — AB (ref 70–99)
Potassium: 4.2 mEq/L (ref 3.5–5.1)
Sodium: 140 mEq/L (ref 135–145)

## 2015-01-29 LAB — URINALYSIS, ROUTINE W REFLEX MICROSCOPIC
Bilirubin Urine: NEGATIVE
Hgb urine dipstick: NEGATIVE
Ketones, ur: NEGATIVE
Leukocytes, UA: NEGATIVE
Nitrite: NEGATIVE
RBC / HPF: NONE SEEN (ref 0–?)
SPECIFIC GRAVITY, URINE: 1.01 (ref 1.000–1.030)
TOTAL PROTEIN, URINE-UPE24: NEGATIVE
URINE GLUCOSE: NEGATIVE
Urobilinogen, UA: 0.2 (ref 0.0–1.0)
WBC, UA: NONE SEEN (ref 0–?)
pH: 6 (ref 5.0–8.0)

## 2015-01-29 LAB — MICROALBUMIN / CREATININE URINE RATIO
CREATININE, U: 31.6 mg/dL
MICROALB/CREAT RATIO: 2.2 mg/g (ref 0.0–30.0)
Microalb, Ur: <0.7 mg/dL (ref 0.0–1.9)

## 2015-01-29 LAB — HEMOGLOBIN A1C: HEMOGLOBIN A1C: 6.6 % — AB (ref 4.6–6.5)

## 2015-01-29 LAB — LDL CHOLESTEROL, DIRECT: Direct LDL: 111 mg/dL

## 2015-01-29 NOTE — Patient Instructions (Signed)

## 2015-01-29 NOTE — Progress Notes (Signed)
Subjective:  Patient ID: Nichole Silva, female    DOB: Aug 28, 1934  Age: 80 y.o. MRN: [id number]  CC: Hypertension; Hyperlipidemia; and Diabetes   HPI Nichole Silva presents for follow-up. She complains of intermittent episodes of diarrhea alternating with constipation consistent with her history of irritable bowel syndrome. She is taking dicyclomine and over-the-counter Imodium for symptom relief. She offers no other complaints today.  Outpatient Prescriptions Prior to Visit  Medication Sig Dispense Refill  . acetaminophen (TYLENOL) 325 MG tablet Take 325 mg by mouth every 6 (six) hours as needed. Pain/headache    . albuterol (PROAIR HFA) 108 (90 BASE) MCG/ACT inhaler INHALE 1-2 PUFFS BY MOUTH EVERY 4 HOURS AS NEEDED FOR WHEEZING OR SHORTNESS OF BREATH. 8.5 each 5  . ALPRAZolam (XANAX) 0.5 MG tablet TAKE 1/2 TO 1 TABLET BY MOUTH 3 TIMES A DAY AS NEEDED 90 tablet 3  . aspirin 81 MG tablet Take 81 mg by mouth daily.      Marland Kitchen atorvastatin (LIPITOR) 40 MG tablet Take 1 tablet (40 mg total) by mouth daily. 30 tablet 1  . cetirizine (ZYRTEC) 10 MG tablet Take 10 mg by mouth daily.      Marland Kitchen dicyclomine (BENTYL) 10 MG capsule TAKE ONE CAPSULE BY MOUTH 3 TIMES A DAY BEFORE MEALS 90 capsule 11  . fluticasone (FLONASE) 50 MCG/ACT nasal spray USE 2 SPRAYS IN EACH NOSTRIL AT BEDTIME 16 g 11  . furosemide (LASIX) 40 MG tablet TAKE 1 TO 2 TABLETS BY MOUTH EVERY DAY 30 tablet 11  . losartan (COZAAR) 50 MG tablet Take 1 tablet (50 mg total) by mouth daily. 90 tablet 3  . NIFEdipine (PROCARDIA XL/ADALAT-CC) 60 MG 24 hr tablet TAKE 1 TABLET BY MOUTH EVERY DAY 30 tablet 4  . NONFORMULARY OR COMPOUNDED ITEM Place 1 Applicatorful vaginally See admin instructions. Estradiol .02% 1 ML Prefilled Applicator Sig: apply vaginally twice a week #90 Day Supply with 4 refills    . Vitamin D, Ergocalciferol, (DRISDOL) 50000 UNITS CAPS capsule TAKE ONE CAPSULE BY MOUTH EVEY MONDAY 4 capsule 11  . WELCHOL 625 MG tablet TAKE 2  TABLETS BY MOUTH EVERY DAY (Patient taking differently: TAKE 1 TABLETS BY MOUTH 2 times daily) 60 tablet 11   No facility-administered medications prior to visit.    ROS Review of Systems  Constitutional: Negative.  Negative for fever, chills, diaphoresis, appetite change and fatigue.  HENT: Negative.   Eyes: Negative.   Respiratory: Negative.  Negative for cough, choking, chest tightness, shortness of breath and stridor.   Cardiovascular: Negative.  Negative for chest pain, palpitations and leg swelling.  Gastrointestinal: Positive for diarrhea and constipation. Negative for nausea, vomiting, abdominal pain and blood in stool.  Endocrine: Negative.  Negative for polydipsia, polyphagia and polyuria.  Genitourinary: Negative.   Musculoskeletal: Negative.  Negative for myalgias, back pain, joint swelling, arthralgias and neck pain.  Skin: Negative.  Negative for rash.  Allergic/Immunologic: Negative.   Neurological: Negative.  Negative for dizziness, tremors, weakness and light-headedness.  Hematological: Negative.  Negative for adenopathy. Does not bruise/bleed easily.  Psychiatric/Behavioral: Negative.     Objective:  BP 130/70 mmHg  Pulse 89  Temp(Src) 98.4 F (36.9 C) (Oral)  Ht 5\' 4"  (1.626 m)  Wt 176 lb (79.833 kg)  BMI 30.20 kg/m2  SpO2 97%  BP Readings from Last 3 Encounters:  01/29/15 130/70  09/25/14 118/78  09/05/14 136/78    Wt Readings from Last 3 Encounters:  01/29/15 176 lb (79.833 kg)  09/25/14 175  lb (79.379 kg)  08/24/14 178 lb (80.74 kg)    Physical Exam  Constitutional: She is oriented to person, place, and time. She appears well-developed and well-nourished. No distress.  HENT:  Head: Normocephalic and atraumatic.  Mouth/Throat: Oropharynx is clear and moist. No oropharyngeal exudate.  Eyes: Conjunctivae are normal. Right eye exhibits no discharge. Left eye exhibits no discharge. No scleral icterus.  Neck: Normal range of motion. Neck supple. No  JVD present. No tracheal deviation present. No thyromegaly present.  Cardiovascular: Normal rate, regular rhythm, normal heart sounds and intact distal pulses.  Exam reveals no gallop and no friction rub.   No murmur heard. Pulmonary/Chest: Effort normal and breath sounds normal. No stridor. No respiratory distress. She has no wheezes. She has no rales. She exhibits no tenderness.  Abdominal: Soft. Bowel sounds are normal. She exhibits no distension and no mass. There is no tenderness. There is no rebound and no guarding.  Musculoskeletal: Normal range of motion. She exhibits no edema or tenderness.  Lymphadenopathy:    She has no cervical adenopathy.  Neurological: She is oriented to person, place, and time.  Skin: Skin is warm and dry. No rash noted. She is not diaphoretic. No erythema. No pallor.  Vitals reviewed.   Lab Results  Component Value Date   WBC 9.1 01/29/2015   HGB 14.0 01/29/2015   HCT 41.9 01/29/2015   PLT 295.0 01/29/2015   GLUCOSE 158* 01/29/2015   CHOL 217* 01/29/2015   TRIG 311.0* 01/29/2015   HDL 50.00 01/29/2015   LDLDIRECT 111.0 01/29/2015   LDLCALC 82 09/14/2013   ALT 15 06/12/2013   AST 18 06/12/2013   NA 140 01/29/2015   K 4.2 01/29/2015   CL 100 01/29/2015   CREATININE 0.85 01/29/2015   BUN 10 01/29/2015   CO2 31 01/29/2015   TSH 3.68 09/14/2013   HGBA1C 6.6* 01/29/2015   MICROALBUR <0.7 01/29/2015    No results found.  Assessment & Plan:   Nichole Silva was seen today for hypertension, hyperlipidemia and diabetes.  Diagnoses and all orders for this visit:  Essential hypertension- her blood pressure is well controlled,  her electrolytes and renal function are stable. -     Basic metabolic panel; Future -     CBC with Differential/Platelet; Future -     Urinalysis, Routine w reflex microscopic (not at [facility]); Future  Type II diabetes mellitus with manifestations- her blood sugars are well-controlled, will continue the current regimen  -     Basic  metabolic panel; Future -     Hemoglobin A1c; Future -     Microalbumin / creatinine urine ratio; Future  Hypertriglyceridemia- improvement noted, she will continue to work on her dietary intake and will try to lose additional weight. -     Lipid panel; Future  Hyperlipidemia with target LDL less than 100- she has not achieved her LDL goal, she has not been taking the full dose of WelChol, I have asked her to be compliant with the recommended dose of WelChol. She is otherwise doing well on the statin. -     Lipid panel; Future  Atherosclerosis of native coronary artery of native heart without angina pectoris- she has no angina or other type of symptoms concerning this. Will continue to treat her risk factors.  IBS (irritable bowel syndrome)- continue Bentyl as needed   I am having Nichole Silva maintain her aspirin, cetirizine, acetaminophen, NONFORMULARY OR COMPOUNDED ITEM, NIFEdipine, furosemide, WELCHOL, dicyclomine, fluticasone, ALPRAZolam, Vitamin D (Ergocalciferol), albuterol, losartan,  and atorvastatin.  No orders of the defined types were placed in this encounter.     Follow-up: Return in about 6 months (around 07/29/2015).  Scarlette Calico, MD

## 2015-01-29 NOTE — Progress Notes (Signed)
Pre visit review using our clinic review tool, if applicable. No additional management support is needed unless otherwise documented below in the visit note. 

## 2015-01-30 ENCOUNTER — Encounter: Payer: Self-pay | Admitting: Internal Medicine

## 2015-01-30 DIAGNOSIS — K589 Irritable bowel syndrome without diarrhea: Secondary | ICD-10-CM | POA: Insufficient documentation

## 2015-01-30 MED ORDER — DICYCLOMINE HCL 10 MG PO CAPS: ORAL_CAPSULE | ORAL | Status: DC

## 2015-02-19 ENCOUNTER — Encounter: Payer: Self-pay | Admitting: Internal Medicine

## 2015-02-19 ENCOUNTER — Other Ambulatory Visit: Payer: Self-pay

## 2015-02-19 MED ORDER — ALPRAZOLAM 0.5 MG PO TABS: ORAL_TABLET | ORAL | Status: DC

## 2015-02-19 NOTE — Telephone Encounter (Signed)
Ok to rf? 

## 2015-03-28 ENCOUNTER — Other Ambulatory Visit: Payer: Self-pay | Admitting: Cardiology

## 2015-04-29 ENCOUNTER — Other Ambulatory Visit: Payer: Self-pay | Admitting: *Deleted

## 2015-04-29 ENCOUNTER — Other Ambulatory Visit: Payer: Self-pay | Admitting: Cardiology

## 2015-04-29 MED ORDER — ATORVASTATIN CALCIUM 40 MG PO TABS: 40.0000 mg | ORAL_TABLET | Freq: Every day | ORAL | Status: DC

## 2015-04-30 ENCOUNTER — Other Ambulatory Visit: Payer: Self-pay

## 2015-04-30 DIAGNOSIS — K589 Irritable bowel syndrome without diarrhea: Secondary | ICD-10-CM

## 2015-04-30 MED ORDER — DICYCLOMINE HCL 10 MG PO CAPS: ORAL_CAPSULE | ORAL | Status: DC

## 2015-04-30 MED ORDER — VITAMIN D (ERGOCALCIFEROL) 1.25 MG (50000 UNIT) PO CAPS: ORAL_CAPSULE | ORAL | Status: DC

## 2015-04-30 MED ORDER — FLUTICASONE PROPIONATE 50 MCG/ACT NA SUSP: NASAL | Status: DC

## 2015-05-28 ENCOUNTER — Other Ambulatory Visit: Payer: Self-pay | Admitting: Internal Medicine

## 2015-06-03 ENCOUNTER — Ambulatory Visit: Payer: Medicare Other | Admitting: Cardiology

## 2015-06-05 ENCOUNTER — Other Ambulatory Visit: Payer: Self-pay | Admitting: Internal Medicine

## 2015-06-10 ENCOUNTER — Other Ambulatory Visit: Payer: Self-pay | Admitting: Internal Medicine

## 2015-06-11 NOTE — Telephone Encounter (Signed)
fxd

## 2015-07-01 ENCOUNTER — Other Ambulatory Visit: Payer: Self-pay | Admitting: Cardiology

## 2015-07-08 ENCOUNTER — Ambulatory Visit (INDEPENDENT_AMBULATORY_CARE_PROVIDER_SITE_OTHER): Payer: Medicare Other | Admitting: Cardiology

## 2015-07-08 ENCOUNTER — Encounter: Payer: Self-pay | Admitting: Cardiology

## 2015-07-08 VITALS — BP 140/70 | HR 87 | Ht 64.0 in | Wt 165.8 lb

## 2015-07-08 DIAGNOSIS — I10 Essential (primary) hypertension: Secondary | ICD-10-CM | POA: Diagnosis not present

## 2015-07-08 DIAGNOSIS — Z8249 Family history of ischemic heart disease and other diseases of the circulatory system: Secondary | ICD-10-CM

## 2015-07-08 DIAGNOSIS — F419 Anxiety disorder, unspecified: Secondary | ICD-10-CM

## 2015-07-08 DIAGNOSIS — R9431 Abnormal electrocardiogram [ECG] [EKG]: Secondary | ICD-10-CM | POA: Diagnosis not present

## 2015-07-08 NOTE — Progress Notes (Signed)
[facility] [address] [place], [place]  [address] Phone: [phone] Fax:  [phone]  Date:  07/08/2015   ID:  Bravery Coonfield, DOB [birth year], MRN [id number]  PCP:  Scarlette Calico, MD   History of Present Illness: Kenyada Blizard is a 81 y.o. female here for the follow up of possible coronary artery disease at the request of Dr. Scarlette Calico.   In review of his note, she has diabetes, hypertension, hyperlipidemia. She has an occasional cough for which she takes an inhaler which sometimes makes her tremulous she states.  At age 24 went to Dr. Lia Foyer. Thought she had a panic attack. Still did not feel right the next morning. Went directly to hospital. Cath.  No CAD noted.   Had 2 stress tests (unknown dates). Unremarkable.  Heart disease runs in her family. Sister has MI with 2 stents. 34 year old daughter with 2 stents.   Has been to therapy for anxiety. She thinks she is doing well. No chest pain. No SOB. No strokelike symptoms.  Her husband still continues to smoke, he is 19 years old, close to the [place] hospital. Her daughter, age 56 has had several stents placed, she still continues to smoke. She is worried about them.   Wt Readings from Last 3 Encounters:  07/08/15 165 lb 12.8 oz (75.206 kg)  01/29/15 176 lb (79.833 kg)  09/25/14 175 lb (79.379 kg)     Past Medical History  Diagnosis Date  . Unspecified hearing loss   . Allergic rhinitis, cause unspecified   . Unspecified chronic bronchitis ([place])   . Unspecified essential hypertension   . Coronary atherosclerosis of unspecified type of vessel, native or graft   . Unspecified venous (peripheral) insufficiency   . Type II or unspecified type diabetes mellitus without mention of complication, not stated as uncontrolled   . Osteoarthrosis, unspecified whether generalized or localized, unspecified site   . Unspecified vitamin D deficiency   . Anxiety state, unspecified   . Hyperlipidemia   . IBS (irritable bowel  syndrome)   . Asthma   . Depression     Past Surgical History  Procedure Laterality Date  . Dilation and curettage of uterus  1960's  . Cholecystectomy  1997  . Left elbow surgery  1992  . Cataract surg  2009    Current Outpatient Prescriptions  Medication Sig Dispense Refill  . acetaminophen (TYLENOL) 325 MG tablet Take 325 mg by mouth every 6 (six) hours as needed for mild pain or moderate pain. Pain/headache    . ALPRAZolam (XANAX) 0.5 MG tablet Take 0.25-0.5 mg by mouth at bedtime as needed for anxiety.    Marland Kitchen aspirin 81 MG tablet Take 81 mg by mouth daily.      Marland Kitchen atorvastatin (LIPITOR) 40 MG tablet TAKE 1 TABLET (40 MG TOTAL) BY MOUTH DAILY AT 6 PM. 30 tablet 0  . cetirizine (ZYRTEC) 10 MG tablet Take 10 mg by mouth daily.      Marland Kitchen dicyclomine (BENTYL) 10 MG capsule TAKE ONE CAPSULE BY MOUTH 3 TIMES A DAY BEFORE MEALS 90 capsule 11  . fluticasone (FLONASE) 50 MCG/ACT nasal spray USE 2 SPRAYS IN EACH NOSTRIL AT BEDTIME 16 g 11  . furosemide (LASIX) 40 MG tablet TAKE 1 TO 2 TABLETS BY MOUTH EVERY DAY 30 tablet 11  . losartan (COZAAR) 50 MG tablet Take 1 tablet (50 mg total) by mouth daily. 90 tablet 3  . NIFEdipine (PROCARDIA XL/ADALAT-CC) 60 MG  24 hr tablet TAKE 1 TABLET BY MOUTH EVERY DAY 30 tablet 4  . NIFEdipine (PROCARDIA-XL/ADALAT CC) 60 MG 24 hr tablet TAKE 1 TABLET BY MOUTH EVERY DAY 30 tablet 6  . NONFORMULARY OR COMPOUNDED ITEM Place 1 Applicatorful vaginally See admin instructions. Estradiol .02% 1 ML Prefilled Applicator Sig: apply vaginally twice a week #90 Day Supply with 4 refills    . PROAIR HFA 108 (90 Base) MCG/ACT inhaler INHALE 1 TO 2 PUFFS BY MOUTH EVERY 4 HOURS AS NEEDED FOR WHEEZE OR SHORTNESS OF BREATH 8.5 Inhaler 11  . Vitamin D, Ergocalciferol, (DRISDOL) 50000 UNITS CAPS capsule TAKE ONE CAPSULE BY MOUTH EVEY MONDAY 4 capsule 11  . WELCHOL 625 MG tablet TAKE 2 TABLETS BY MOUTH EVERY DAY 60 tablet 11   No current facility-administered medications for this  visit.    Allergies:    Allergies  Allergen Reactions  . Codeine     Unknown per pt   . Metformin And Related     N&V  . Penicillins     REACTION: ITCHING AND SWELLING    Social History:  The patient  reports that she has never smoked. She has never used smokeless tobacco. She reports that she does not drink alcohol or use illicit drugs. Retired 7/14 - [employer]. Had a granddaughter at [facility] who died of a drug overdose.  Family History  Problem Relation Age of Onset  . Emphysema Mother   . Heart disease Father   . Colon cancer    . Pancreatic cancer    . Colon cancer Sister     ROS:  Please see the history of present illness.   +Anxiety. Denies any fevers, chills, orthopnea, PND, syncope   All other systems reviewed and negative.   PHYSICAL EXAM: VS:  BP 140/70 mmHg  Pulse 87  Ht 5\' 4"  (1.626 m)  Wt 165 lb 12.8 oz (75.206 kg)  BMI 28.45 kg/m2  SpO2 91% Well nourished, well developed, in no acute distress HEENT: normal, [place]/AT, EOMI Neck: no JVD, normal carotid upstroke, no bruit Cardiac:  normal S1, S2; RRR; no murmur Lungs:  clear to auscultation bilaterally, no wheezing, rhonchi or rales Abd: soft, nontender, no hepatomegaly, no bruits Ext: 1+ chronic edema,  compression hose today, 2+ radial pulses Skin: warm and dry GU: deferred Neuro: no focal abnormalities noted, AAO x 3  EKG:  EKG was performed today. 07/08/15-sinus rhythm, 88, subtle T-wave inversion noted in V2, V3, V4. Fairly nonspecific ST-T wave changes. Personally viewed-prior 10/06/13: Sinus rhythm, 86, nonspecific ST-T wave changes, consider anterolateral ischemia.  No significant change from prior EKG from 06/09/12.  Prior office records reviewed.  Labs: 09/14/13-potassium 3.5, creatinine 0.9, LDL 82, triglycerides 169, hemoglobin 14.4.  ASSESSMENT AND PLAN:  Coronary artery disease-She states that she has had a cardiac catheterization in the distant past and did not have any significant CAD. We  will continue with primary prevention. Currently do not see any objective evidence of CAD. Diabetes-Dr. Ronnald Ramp Hypertension-medications reviewed, Dr. Ronnald Ramp started losartan.  At home about the same as above. May need further increase. She sees him next month.  Hyperlipidemia- atorvastatin 40 mg. On nifedipine. Abnormal EKG-T-wave changes noted. She is asymptomatic. I will continue to monitor clinically. Statin. If symptoms began to occur, we will likely pursue stress testing. Anxiety-currently doing well. Chronic venous insufficiency-compression hose. From years of standing, retired from [employer].   PRN followup.  Signed, Candee Furbish, MD [facility]  07/08/2015 9:39 AM

## 2015-07-08 NOTE — Patient Instructions (Addendum)
Medication Instructions:   Your physician recommends that you continue on your current medications as directed. Please refer to the Current Medication list given to you today.   If you need a refill on your cardiac medications before your next appointment, please call your pharmacy.  Labwork: NONE ORDER TODAY   Testing/Procedures:  NONE ORDER TODAY    Follow-Up:  AS NEEDED FOR  ANY CARDIAC RELATED SYMPTOMS   Any Other Special Instructions Will Be Listed Below (If Applicable).                                                                                                                                                   

## 2015-07-29 ENCOUNTER — Other Ambulatory Visit (INDEPENDENT_AMBULATORY_CARE_PROVIDER_SITE_OTHER): Payer: Medicare Other

## 2015-07-29 ENCOUNTER — Ambulatory Visit (INDEPENDENT_AMBULATORY_CARE_PROVIDER_SITE_OTHER): Payer: Medicare Other | Admitting: Internal Medicine

## 2015-07-29 ENCOUNTER — Encounter: Payer: Self-pay | Admitting: Internal Medicine

## 2015-07-29 VITALS — BP 138/72 | HR 100 | Temp 96.9°F | Resp 16 | Ht 64.0 in | Wt 167.0 lb

## 2015-07-29 DIAGNOSIS — E118 Type 2 diabetes mellitus with unspecified complications: Secondary | ICD-10-CM

## 2015-07-29 DIAGNOSIS — J4540 Moderate persistent asthma, uncomplicated: Secondary | ICD-10-CM | POA: Diagnosis not present

## 2015-07-29 DIAGNOSIS — E785 Hyperlipidemia, unspecified: Secondary | ICD-10-CM

## 2015-07-29 DIAGNOSIS — E781 Pure hyperglyceridemia: Secondary | ICD-10-CM

## 2015-07-29 DIAGNOSIS — Z23 Encounter for immunization: Secondary | ICD-10-CM

## 2015-07-29 LAB — LIPID PANEL
CHOLESTEROL: 183 mg/dL (ref 0–200)
HDL: 47.3 mg/dL (ref >39.00)
NonHDL: 135.25
TRIGLYCERIDES: 202 mg/dL — AB (ref 0.0–149.0)
Total CHOL/HDL Ratio: 4
VLDL: 40.4 mg/dL — AB (ref 0.0–40.0)

## 2015-07-29 LAB — BASIC METABOLIC PANEL
BUN: 9 mg/dL (ref 6–23)
CALCIUM: 10 mg/dL (ref 8.4–10.5)
CO2: 27 mEq/L (ref 19–32)
Chloride: 104 mEq/L (ref 96–112)
Creatinine, Ser: 0.75 mg/dL (ref 0.40–1.20)
GFR: 79.3 mL/min (ref >60.00)
GLUCOSE: 136 mg/dL — AB (ref 70–99)
POTASSIUM: 4 meq/L (ref 3.5–5.1)
Sodium: 139 mEq/L (ref 135–145)

## 2015-07-29 LAB — HEMOGLOBIN A1C: Hgb A1c MFr Bld: 6.6 % — ABNORMAL HIGH (ref 4.6–6.5)

## 2015-07-29 LAB — LDL CHOLESTEROL, DIRECT: Direct LDL: 102 mg/dL

## 2015-07-29 MED ORDER — FLUTICASONE FUROATE-VILANTEROL 200-25 MCG/INH IN AEPB: 1.0000 | INHALATION_SPRAY | Freq: Every day | RESPIRATORY_TRACT | Status: DC

## 2015-07-29 NOTE — Patient Instructions (Signed)
Asthma, Adult Asthma is a recurring condition in which the airways tighten and narrow. Asthma can make it difficult to breathe. It can cause coughing, wheezing, and shortness of breath. Asthma episodes, also called asthma attacks, range from minor to life-threatening. Asthma cannot be cured, but medicines and lifestyle changes can help control it. CAUSES Asthma is believed to be caused by inherited (genetic) and environmental factors, but its exact cause is unknown. Asthma may be triggered by allergens, lung infections, or irritants in the air. Asthma triggers are different for each person. Common triggers include:   Animal dander.  Dust mites.  Cockroaches.  Pollen from trees or grass.  Mold.  Smoke.  Air pollutants such as dust, household cleaners, hair sprays, aerosol sprays, paint fumes, strong chemicals, or strong odors.  Cold air, weather changes, and winds (which increase molds and pollens in the air).  Strong emotional expressions such as crying or laughing hard.  Stress.  Certain medicines (such as aspirin) or types of drugs (such as beta-blockers).  Sulfites in foods and drinks. Foods and drinks that may contain sulfites include dried fruit, potato chips, and sparkling grape juice.  Infections or inflammatory conditions such as the flu, a cold, or an inflammation of the nasal membranes (rhinitis).  Gastroesophageal reflux disease (GERD).  Exercise or strenuous activity. SYMPTOMS Symptoms may occur immediately after asthma is triggered or many hours later. Symptoms include:  Wheezing.  Excessive nighttime or early morning coughing.  Frequent or severe coughing with a common cold.  Chest tightness.  Shortness of breath. DIAGNOSIS  The diagnosis of asthma is made by a review of your medical history and a physical exam. Tests may also be performed. These may include:  Lung function studies. These tests show how much air you breathe in and out.  Allergy  tests.  Imaging tests such as X-rays. TREATMENT  Asthma cannot be cured, but it can usually be controlled. Treatment involves identifying and avoiding your asthma triggers. It also involves medicines. There are 2 classes of medicine used for asthma treatment:   Controller medicines. These prevent asthma symptoms from occurring. They are usually taken every day.  Reliever or rescue medicines. These quickly relieve asthma symptoms. They are used as needed and provide short-term relief. Your health care provider will help you create an asthma action plan. An asthma action plan is a written plan for managing and treating your asthma attacks. It includes a list of your asthma triggers and how they may be avoided. It also includes information on when medicines should be taken and when their dosage should be changed. An action plan may also involve the use of a device called a peak flow meter. A peak flow meter measures how well the lungs are working. It helps you monitor your condition. HOME CARE INSTRUCTIONS   Take medicines only as directed by your health care provider. Speak with your health care provider if you have questions about how or when to take the medicines.  Use a peak flow meter as directed by your health care provider. Record and keep track of readings.  Understand and use the action plan to help minimize or stop an asthma attack without needing to seek medical care.  Control your home environment in the following ways to help prevent asthma attacks:  Do not smoke. Avoid being exposed to secondhand smoke.  Change your heating and air conditioning filter regularly.  Limit your use of fireplaces and wood stoves.  Get rid of pests (such as roaches   and mice) and their droppings.  Throw away plants if you see mold on them.  Clean your floors and dust regularly. Use unscented cleaning products.  Try to have someone else vacuum for you regularly. Stay out of rooms while they are  being vacuumed and for a short while afterward. If you vacuum, use a dust mask from a hardware store, a double-layered or microfilter vacuum cleaner bag, or a vacuum cleaner with a HEPA filter.  Replace carpet with wood, tile, or vinyl flooring. Carpet can trap dander and dust.  Use allergy-proof pillows, mattress covers, and box spring covers.  Wash bed sheets and blankets every week in hot water and dry them in a dryer.  Use blankets that are made of polyester or cotton.  Clean bathrooms and kitchens with bleach. If possible, have someone repaint the walls in these rooms with mold-resistant paint. Keep out of the rooms that are being cleaned and painted.  Wash hands frequently. SEEK MEDICAL CARE IF:   You have wheezing, shortness of breath, or a cough even if taking medicine to prevent attacks.  The colored mucus you cough up (sputum) is thicker than usual.  Your sputum changes from clear or white to yellow, green, gray, or bloody.  You have any problems that may be related to the medicines you are taking (such as a rash, itching, swelling, or trouble breathing).  You are using a reliever medicine more than 2-3 times per week.  Your peak flow is still at 50-79% of your personal best after following your action plan for 1 hour.  You have a fever. SEEK IMMEDIATE MEDICAL CARE IF:   You seem to be getting worse and are unresponsive to treatment during an asthma attack.  You are short of breath even at rest.  You get short of breath when doing very little physical activity.  You have difficulty eating, drinking, or talking due to asthma symptoms.  You develop chest pain.  You develop a fast heartbeat.  You have a bluish color to your lips or fingernails.  You are light-headed, dizzy, or faint.  Your peak flow is less than 50% of your personal best.   This information is not intended to replace advice given to you by your health care provider. Make sure you discuss any  questions you have with your health care provider.   Document Released: 05/11/2005 Document Revised: 01/30/2015 Document Reviewed: 12/08/2012 Elsevier Interactive Patient Education 2016 Elsevier Inc.  

## 2015-07-29 NOTE — Progress Notes (Signed)
Subjective:  Patient ID: Nichole Silva, female    DOB: 1934/07/03  Age: 81 y.o. MRN: [id number]  CC: Asthma; Hypertension; and Diabetes   HPI [name] presents for follow-up and complains of intermittent wheezing. She has been using an albuterol inhaler but she said she has been using it so much that it makes her feel jittery. She has had no recent episodes of cough, hemoptysis, fever, chills.  She tells me that her blood pressure has been well controlled.  She also reports that her blood sugars aren't well-controlled.  Outpatient Prescriptions Prior to Visit  Medication Sig Dispense Refill  . acetaminophen (TYLENOL) 325 MG tablet Take 325 mg by mouth every 6 (six) hours as needed for mild pain or moderate pain. Pain/headache    . ALPRAZolam (XANAX) 0.5 MG tablet Take 0.5-1 mg by mouth as needed for anxiety.     Marland Kitchen aspirin 81 MG tablet Take 81 mg by mouth daily.      Marland Kitchen atorvastatin (LIPITOR) 40 MG tablet TAKE 1 TABLET (40 MG TOTAL) BY MOUTH DAILY AT 6 PM. 30 tablet 0  . cetirizine (ZYRTEC) 10 MG tablet Take 10 mg by mouth daily.      Marland Kitchen dicyclomine (BENTYL) 10 MG capsule TAKE ONE CAPSULE BY MOUTH 3 TIMES A DAY BEFORE MEALS 90 capsule 11  . fluticasone (FLONASE) 50 MCG/ACT nasal spray USE 2 SPRAYS IN EACH NOSTRIL AT BEDTIME 16 g 11  . furosemide (LASIX) 40 MG tablet TAKE 1 TO 2 TABLETS BY MOUTH EVERY DAY 30 tablet 11  . losartan (COZAAR) 50 MG tablet Take 1 tablet (50 mg total) by mouth daily. 90 tablet 3  . NIFEdipine (PROCARDIA-XL/ADALAT CC) 60 MG 24 hr tablet TAKE 1 TABLET BY MOUTH EVERY DAY 30 tablet 6  . PROAIR HFA 108 (90 Base) MCG/ACT inhaler INHALE 1 TO 2 PUFFS BY MOUTH EVERY 4 HOURS AS NEEDED FOR WHEEZE OR SHORTNESS OF BREATH 8.5 Inhaler 11  . Vitamin D, Ergocalciferol, (DRISDOL) 50000 UNITS CAPS capsule TAKE ONE CAPSULE BY MOUTH EVEY MONDAY 4 capsule 11  . WELCHOL 625 MG tablet TAKE 2 TABLETS BY MOUTH EVERY DAY 60 tablet 11  . NIFEdipine (PROCARDIA XL/ADALAT-CC) 60 MG 24 hr  tablet TAKE 1 TABLET BY MOUTH EVERY DAY 30 tablet 4  . NONFORMULARY OR COMPOUNDED ITEM Place 1 Applicatorful vaginally See admin instructions. Estradiol .02% 1 ML Prefilled Applicator Sig: apply vaginally twice a week #90 Day Supply with 4 refills     No facility-administered medications prior to visit.    ROS Review of Systems  Constitutional: Negative.  Negative for fever, chills, diaphoresis, appetite change and fatigue.  HENT: Negative.  Negative for congestion, sinus pressure, sore throat and trouble swallowing.   Eyes: Negative.   Respiratory: Positive for wheezing. Negative for cough, choking, chest tightness, shortness of breath and stridor.   Cardiovascular: Negative.  Negative for chest pain, palpitations and leg swelling.  Gastrointestinal: Negative.  Negative for nausea, vomiting, abdominal pain, diarrhea, constipation and blood in stool.  Endocrine: Negative.  Negative for polydipsia, polyphagia and polyuria.  Genitourinary: Negative.   Musculoskeletal: Negative.  Negative for myalgias, back pain and arthralgias.  Skin: Negative.  Negative for color change, pallor and rash.  Allergic/Immunologic: Negative.   Neurological: Negative.  Negative for dizziness.  Hematological: Negative.  Negative for adenopathy. Does not bruise/bleed easily.  Psychiatric/Behavioral: Negative.     Objective:  BP 138/72 mmHg  Pulse 100  Temp(Src) 96.9 F (36.1 C) (Oral)  Resp 16  Ht 5\' 4"  (1.626 m)  Wt 167 lb (75.751 kg)  BMI 28.65 kg/m2  SpO2 95%  BP Readings from Last 3 Encounters:  07/29/15 138/72  07/08/15 140/70  01/29/15 130/70    Wt Readings from Last 3 Encounters:  07/29/15 167 lb (75.751 kg)  07/08/15 165 lb 12.8 oz (75.206 kg)  01/29/15 176 lb (79.833 kg)    Physical Exam  Constitutional: She is oriented to person, place, and time.  Non-toxic appearance. She does not have a sickly appearance. She does not appear ill. No distress.  HENT:  Nose: Nose normal.    Mouth/Throat: Oropharynx is clear and moist. No oropharyngeal exudate.  Eyes: Conjunctivae are normal. Right eye exhibits no discharge. Left eye exhibits no discharge. No scleral icterus.  Neck: Normal range of motion. Neck supple. No JVD present. No tracheal deviation present. No thyromegaly present.  Cardiovascular: Normal rate, regular rhythm, normal heart sounds and intact distal pulses.  Exam reveals no gallop and no friction rub.   No murmur heard. Pulmonary/Chest: Effort normal. No accessory muscle usage or stridor. No respiratory distress. She has no decreased breath sounds. She has wheezes in the left lower field. She has no rhonchi. She has no rales. She exhibits no tenderness.  Abdominal: Soft. Bowel sounds are normal. She exhibits no distension and no mass. There is no tenderness. There is no rebound and no guarding.  Musculoskeletal: Normal range of motion. She exhibits no edema or tenderness.  Lymphadenopathy:    She has no cervical adenopathy.  Neurological: She is oriented to person, place, and time.  Skin: Skin is warm and dry. No rash noted. She is not diaphoretic. No erythema. No pallor.  Vitals reviewed.   Lab Results  Component Value Date   WBC 9.1 01/29/2015   HGB 14.0 01/29/2015   HCT 41.9 01/29/2015   PLT 295.0 01/29/2015   GLUCOSE 136* 07/29/2015   CHOL 183 07/29/2015   TRIG 202.0* 07/29/2015   HDL 47.30 07/29/2015   LDLDIRECT 102.0 07/29/2015   LDLCALC 82 09/14/2013   ALT 15 06/12/2013   AST 18 06/12/2013   NA 139 07/29/2015   K 4.0 07/29/2015   CL 104 07/29/2015   CREATININE 0.75 07/29/2015   BUN 9 07/29/2015   CO2 27 07/29/2015   TSH 3.68 09/14/2013   HGBA1C 6.6* 07/29/2015   MICROALBUR <0.7 01/29/2015    No results found.  Assessment & Plan:   Renesmee was seen today for asthma, hypertension and diabetes.  Diagnoses and all orders for this visit:  Hyperlipidemia with target LDL less than 100- she has achieved her LDL goal -     Lipid panel;  Future  Encounter for immunization -     Flu Vaccine QUAD 36+ mos IM  Hypertriglyceridemia- improvement noted, this does not need to be treated. -     Lipid panel; Future  Type 2 diabetes mellitus with complication, without long-term current use of insulin ([place])- her A1c is 6.6%, her blood sugars are well-controlled. -     Basic metabolic panel; Future -     Hemoglobin A1c; Future  Asthma, moderate persistent, uncomplicated- her symptoms are not well-controlled on the albuterol inhaler, I have asked her to add the combination of a LABA/ICS, I gave her samples of Breo and I showed her how to use it, she demonstrated proficiency with its use. She can continue to use albuterol as needed. -     fluticasone furoate-vilanterol (BREO ELLIPTA) 200-25 MCG/INH AEPB; Inhale 1 puff into  the lungs daily.   I have discontinued Ms. Silerio NONFORMULARY OR COMPOUNDED ITEM. I am also having her start on fluticasone furoate-vilanterol. Additionally, I am having her maintain her aspirin, cetirizine, acetaminophen, furosemide, WELCHOL, losartan, fluticasone, Vitamin D (Ergocalciferol), dicyclomine, NIFEdipine, PROAIR HFA, atorvastatin, and ALPRAZolam.  Meds ordered this encounter  Medications  . fluticasone furoate-vilanterol (BREO ELLIPTA) 200-25 MCG/INH AEPB    Sig: Inhale 1 puff into the lungs daily.    Dispense:  30 each    Refill:  11     Follow-up: Return in about 6 months (around 01/29/2016).  Scarlette Calico, MD

## 2015-07-29 NOTE — Progress Notes (Signed)
Pre visit review using our clinic review tool, if applicable. No additional management support is needed unless otherwise documented below in the visit note. 

## 2015-08-02 ENCOUNTER — Other Ambulatory Visit: Payer: Self-pay | Admitting: Cardiology

## 2015-08-07 ENCOUNTER — Other Ambulatory Visit: Payer: Self-pay | Admitting: Internal Medicine

## 2015-08-08 NOTE — Telephone Encounter (Signed)
Please advise, thanks.

## 2015-08-08 NOTE — Telephone Encounter (Signed)
faxed

## 2015-09-04 LAB — HM DIABETES EYE EXAM

## 2015-10-01 ENCOUNTER — Other Ambulatory Visit: Payer: Self-pay | Admitting: Internal Medicine

## 2015-10-01 NOTE — Telephone Encounter (Signed)
PCP out of office, please advise.

## 2015-10-30 ENCOUNTER — Other Ambulatory Visit: Payer: Self-pay | Admitting: Family

## 2015-10-31 NOTE — Telephone Encounter (Signed)
Faxed script back to [facility].../lmb 

## 2015-12-09 ENCOUNTER — Telehealth: Payer: Self-pay

## 2015-12-09 NOTE — Telephone Encounter (Signed)
Patient is on the list for [facility] 2017 and may be a good candidate for an AWV in 2017. Please let me know if/when appt is scheduled.   Pt is due to come back in September per LOV.

## 2015-12-10 DIAGNOSIS — Z1231 Encounter for screening mammogram for malignant neoplasm of breast: Secondary | ICD-10-CM | POA: Diagnosis not present

## 2015-12-10 DIAGNOSIS — N819 Female genital prolapse, unspecified: Secondary | ICD-10-CM | POA: Diagnosis not present

## 2015-12-10 LAB — HM MAMMOGRAPHY

## 2015-12-26 ENCOUNTER — Other Ambulatory Visit: Payer: Self-pay | Admitting: Internal Medicine

## 2015-12-26 DIAGNOSIS — E1165 Type 2 diabetes mellitus with hyperglycemia: Secondary | ICD-10-CM

## 2015-12-26 DIAGNOSIS — I10 Essential (primary) hypertension: Secondary | ICD-10-CM

## 2015-12-26 DIAGNOSIS — IMO0002 Reserved for concepts with insufficient information to code with codable children: Secondary | ICD-10-CM

## 2016-01-28 NOTE — Telephone Encounter (Signed)
Dr. Ronnald Ramp has her scheduled on Thursday for Wellness visit

## 2016-01-30 ENCOUNTER — Encounter: Payer: Self-pay | Admitting: Internal Medicine

## 2016-01-30 ENCOUNTER — Other Ambulatory Visit (INDEPENDENT_AMBULATORY_CARE_PROVIDER_SITE_OTHER): Payer: Medicare Other

## 2016-01-30 ENCOUNTER — Ambulatory Visit (INDEPENDENT_AMBULATORY_CARE_PROVIDER_SITE_OTHER): Payer: Medicare Other | Admitting: Internal Medicine

## 2016-01-30 VITALS — BP 130/80 | HR 72 | Temp 97.8°F | Resp 16 | Ht 64.0 in | Wt 173.0 lb

## 2016-01-30 DIAGNOSIS — I10 Essential (primary) hypertension: Secondary | ICD-10-CM | POA: Diagnosis not present

## 2016-01-30 DIAGNOSIS — Z23 Encounter for immunization: Secondary | ICD-10-CM

## 2016-01-30 DIAGNOSIS — E118 Type 2 diabetes mellitus with unspecified complications: Secondary | ICD-10-CM | POA: Diagnosis not present

## 2016-01-30 DIAGNOSIS — Z0000 Encounter for general adult medical examination without abnormal findings: Secondary | ICD-10-CM

## 2016-01-30 DIAGNOSIS — J4540 Moderate persistent asthma, uncomplicated: Secondary | ICD-10-CM

## 2016-01-30 LAB — URINALYSIS, ROUTINE W REFLEX MICROSCOPIC
BILIRUBIN URINE: NEGATIVE
Hgb urine dipstick: NEGATIVE
Ketones, ur: NEGATIVE
Nitrite: NEGATIVE
PH: 6 (ref 5.0–8.0)
SPECIFIC GRAVITY, URINE: 1.01 (ref 1.000–1.030)
Total Protein, Urine: NEGATIVE
Urine Glucose: NEGATIVE
Urobilinogen, UA: 0.2 (ref 0.0–1.0)

## 2016-01-30 LAB — BASIC METABOLIC PANEL
BUN: 11 mg/dL (ref 6–23)
CALCIUM: 9.5 mg/dL (ref 8.4–10.5)
CO2: 30 mEq/L (ref 19–32)
CREATININE: 0.86 mg/dL (ref 0.40–1.20)
Chloride: 106 mEq/L (ref 96–112)
GFR: 67.63 mL/min (ref >60.00)
Glucose, Bld: 126 mg/dL — ABNORMAL HIGH (ref 70–99)
Potassium: 4.3 mEq/L (ref 3.5–5.1)
Sodium: 139 mEq/L (ref 135–145)

## 2016-01-30 LAB — HEMOGLOBIN A1C: HEMOGLOBIN A1C: 6 % (ref 4.6–6.5)

## 2016-01-30 LAB — MICROALBUMIN / CREATININE URINE RATIO
CREATININE, U: 61.7 mg/dL
MICROALB/CREAT RATIO: 0.6 mg/g (ref 0.0–30.0)
Microalb, Ur: 0.4 mg/dL (ref 0.0–1.9)

## 2016-01-30 NOTE — Patient Instructions (Signed)
Preventive Care for Adults, Female A healthy lifestyle and preventive care can promote health and wellness. Preventive health guidelines for women include the following key practices.  A routine yearly physical is a good way to check with your health care provider about your health and preventive screening. It is a chance to share any concerns and updates on your health and to receive a thorough exam.  Visit your dentist for a routine exam and preventive care every 6 months. Brush your teeth twice a day and floss once a day. Good oral hygiene prevents tooth decay and gum disease.  The frequency of eye exams is based on your age, health, family medical history, use of contact lenses, and other factors. Follow your health care provider's recommendations for frequency of eye exams.  Eat a healthy diet. Foods like vegetables, fruits, whole grains, low-fat dairy products, and lean protein foods contain the nutrients you need without too many calories. Decrease your intake of foods high in solid fats, added sugars, and salt. Eat the right amount of calories for you.Get information about a proper diet from your health care provider, if necessary.  Regular physical exercise is one of the most important things you can do for your health. Most adults should get at least 150 minutes of moderate-intensity exercise (any activity that increases your heart rate and causes you to sweat) each week. In addition, most adults need muscle-strengthening exercises on 2 or more days a week.  Maintain a healthy weight. The body mass index (BMI) is a screening tool to identify possible weight problems. It provides an estimate of body fat based on height and weight. Your health care provider can find your BMI and can help you achieve or maintain a healthy weight.For adults 20 years and older:  A BMI below 18.5 is considered underweight.  A BMI of 18.5 to 24.9 is normal.  A BMI of 25 to 29.9 is considered overweight.  A  BMI of 30 and above is considered obese.  Maintain normal blood lipids and cholesterol levels by exercising and minimizing your intake of saturated fat. Eat a balanced diet with plenty of fruit and vegetables. Blood tests for lipids and cholesterol should begin at age 45 and be repeated every 5 years. If your lipid or cholesterol levels are high, you are over 50, or you are at high risk for heart disease, you may need your cholesterol levels checked more frequently.Ongoing high lipid and cholesterol levels should be treated with medicines if diet and exercise are not working.  If you smoke, find out from your health care provider how to quit. If you do not use tobacco, do not start.  Lung cancer screening is recommended for adults aged 45-80 years who are at high risk for developing lung cancer because of a history of smoking. A yearly low-dose CT scan of the lungs is recommended for people who have at least a 30-pack-year history of smoking and are a current smoker or have quit within the past 15 years. A pack year of smoking is smoking an average of 1 pack of cigarettes a day for 1 year (for example: 1 pack a day for 30 years or 2 packs a day for 15 years). Yearly screening should continue until the smoker has stopped smoking for at least 15 years. Yearly screening should be stopped for people who develop a health problem that would prevent them from having lung cancer treatment.  If you are pregnant, do not drink alcohol. If you are  breastfeeding, be very cautious about drinking alcohol. If you are not pregnant and choose to drink alcohol, do not have more than 1 drink per day. One drink is considered to be 12 ounces (355 mL) of beer, 5 ounces (148 mL) of wine, or 1.5 ounces (44 mL) of liquor.  Avoid use of street drugs. Do not share needles with anyone. Ask for help if you need support or instructions about stopping the use of drugs.  High blood pressure causes heart disease and increases the risk  of stroke. Your blood pressure should be checked at least every 1 to 2 years. Ongoing high blood pressure should be treated with medicines if weight loss and exercise do not work.  If you are 55-79 years old, ask your health care provider if you should take aspirin to prevent strokes.  Diabetes screening is done by taking a blood sample to check your blood glucose level after you have not eaten for a certain period of time (fasting). If you are not overweight and you do not have risk factors for diabetes, you should be screened once every 3 years starting at age 45. If you are overweight or obese and you are 40-70 years of age, you should be screened for diabetes every year as part of your cardiovascular risk assessment.  Breast cancer screening is essential preventive care for women. You should practice "breast self-awareness." This means understanding the normal appearance and feel of your breasts and may include breast self-examination. Any changes detected, no matter how small, should be reported to a health care provider. Women in their 20s and 30s should have a clinical breast exam (CBE) by a health care provider as part of a regular health exam every 1 to 3 years. After age 40, women should have a CBE every year. Starting at age 40, women should consider having a mammogram (breast X-ray test) every year. Women who have a family history of breast cancer should talk to their health care provider about genetic screening. Women at a high risk of breast cancer should talk to their health care providers about having an MRI and a mammogram every year.  Breast cancer gene (BRCA)-related cancer risk assessment is recommended for women who have family members with BRCA-related cancers. BRCA-related cancers include breast, ovarian, tubal, and peritoneal cancers. Having family members with these cancers may be associated with an increased risk for harmful changes (mutations) in the breast cancer genes BRCA1 and  BRCA2. Results of the assessment will determine the need for genetic counseling and BRCA1 and BRCA2 testing.  Your health care provider may recommend that you be screened regularly for cancer of the pelvic organs (ovaries, uterus, and vagina). This screening involves a pelvic examination, including checking for microscopic changes to the surface of your cervix (Pap test). You may be encouraged to have this screening done every 3 years, beginning at age 21.  For women ages 30-65, health care providers may recommend pelvic exams and Pap testing every 3 years, or they may recommend the Pap and pelvic exam, combined with testing for human papilloma virus (HPV), every 5 years. Some types of HPV increase your risk of cervical cancer. Testing for HPV may also be done on women of any age with unclear Pap test results.  Other health care providers may not recommend any screening for nonpregnant women who are considered low risk for pelvic cancer and who do not have symptoms. Ask your health care provider if a screening pelvic exam is right for   you.  If you have had past treatment for cervical cancer or a condition that could lead to cancer, you need Pap tests and screening for cancer for at least 20 years after your treatment. If Pap tests have been discontinued, your risk factors (such as having a new sexual partner) need to be reassessed to determine if screening should resume. Some women have medical problems that increase the chance of getting cervical cancer. In these cases, your health care provider may recommend more frequent screening and Pap tests.  Colorectal cancer can be detected and often prevented. Most routine colorectal cancer screening begins at the age of 50 years and continues through age 75 years. However, your health care provider may recommend screening at an earlier age if you have risk factors for colon cancer. On a yearly basis, your health care provider may provide home test kits to check  for hidden blood in the stool. Use of a small camera at the end of a tube, to directly examine the colon (sigmoidoscopy or colonoscopy), can detect the earliest forms of colorectal cancer. Talk to your health care provider about this at age 50, when routine screening begins. Direct exam of the colon should be repeated every 5-10 years through age 75 years, unless early forms of precancerous polyps or small growths are found.  People who are at an increased risk for hepatitis B should be screened for this virus. You are considered at high risk for hepatitis B if:  You were born in a country where hepatitis B occurs often. Talk with your health care provider about which countries are considered high risk.  Your parents were born in a high-risk country and you have not received a shot to protect against hepatitis B (hepatitis B vaccine).  You have HIV or AIDS.  You use needles to inject street drugs.  You live with, or have sex with, someone who has hepatitis B.  You get hemodialysis treatment.  You take certain medicines for conditions like cancer, organ transplantation, and autoimmune conditions.  Hepatitis C blood testing is recommended for all people born from 1945 through 1965 and any individual with known risks for hepatitis C.  Practice safe sex. Use condoms and avoid high-risk sexual practices to reduce the spread of sexually transmitted infections (STIs). STIs include gonorrhea, chlamydia, syphilis, trichomonas, herpes, HPV, and human immunodeficiency virus (HIV). Herpes, HIV, and HPV are viral illnesses that have no cure. They can result in disability, cancer, and death.  You should be screened for sexually transmitted illnesses (STIs) including gonorrhea and chlamydia if:  You are sexually active and are younger than 24 years.  You are older than 24 years and your health care provider tells you that you are at risk for this type of infection.  Your sexual activity has changed  since you were last screened and you are at an increased risk for chlamydia or gonorrhea. Ask your health care provider if you are at risk.  If you are at risk of being infected with HIV, it is recommended that you take a prescription medicine daily to prevent HIV infection. This is called preexposure prophylaxis (PrEP). You are considered at risk if:  You are sexually active and do not regularly use condoms or know the HIV status of your partner(s).  You take drugs by injection.  You are sexually active with a partner who has HIV.  Talk with your health care provider about whether you are at high risk of being infected with HIV. If   you choose to begin PrEP, you should first be tested for HIV. You should then be tested every 3 months for as long as you are taking PrEP.  Osteoporosis is a disease in which the bones lose minerals and strength with aging. This can result in serious bone fractures or breaks. The risk of osteoporosis can be identified using a bone density scan. Women ages 67 years and over and women at risk for fractures or osteoporosis should discuss screening with their health care providers. Ask your health care provider whether you should take a calcium supplement or vitamin D to reduce the rate of osteoporosis.  Menopause can be associated with physical symptoms and risks. Hormone replacement therapy is available to decrease symptoms and risks. You should talk to your health care provider about whether hormone replacement therapy is right for you.  Use sunscreen. Apply sunscreen liberally and repeatedly throughout the day. You should seek shade when your shadow is shorter than you. Protect yourself by wearing long sleeves, pants, a wide-brimmed hat, and sunglasses year round, whenever you are outdoors.  Once a month, do a whole body skin exam, using a mirror to look at the skin on your back. Tell your health care provider of new moles, moles that have irregular borders, moles that  are larger than a pencil eraser, or moles that have changed in shape or color.  Stay current with required vaccines (immunizations).  Influenza vaccine. All adults should be immunized every year.  Tetanus, diphtheria, and acellular pertussis (Td, Tdap) vaccine. Pregnant women should receive 1 dose of Tdap vaccine during each pregnancy. The dose should be obtained regardless of the length of time since the last dose. Immunization is preferred during the 27th-36th week of gestation. An adult who has not previously received Tdap or who does not know her vaccine status should receive 1 dose of Tdap. This initial dose should be followed by tetanus and diphtheria toxoids (Td) booster doses every 10 years. Adults with an unknown or incomplete history of completing a 3-dose immunization series with Td-containing vaccines should begin or complete a primary immunization series including a Tdap dose. Adults should receive a Td booster every 10 years.  Varicella vaccine. An adult without evidence of immunity to varicella should receive 2 doses or a second dose if she has previously received 1 dose. Pregnant females who do not have evidence of immunity should receive the first dose after pregnancy. This first dose should be obtained before leaving the health care facility. The second dose should be obtained 4-8 weeks after the first dose.  Human papillomavirus (HPV) vaccine. Females aged 13-26 years who have not received the vaccine previously should obtain the 3-dose series. The vaccine is not recommended for use in pregnant females. However, pregnancy testing is not needed before receiving a dose. If a female is found to be pregnant after receiving a dose, no treatment is needed. In that case, the remaining doses should be delayed until after the pregnancy. Immunization is recommended for any person with an immunocompromised condition through the age of 61 years if she did not get any or all doses earlier. During the  3-dose series, the second dose should be obtained 4-8 weeks after the first dose. The third dose should be obtained 24 weeks after the first dose and 16 weeks after the second dose.  Zoster vaccine. One dose is recommended for adults aged 30 years or older unless certain conditions are present.  Measles, mumps, and rubella (MMR) vaccine. Adults born  before 1957 generally are considered immune to measles and mumps. Adults born in 1957 or later should have 1 or more doses of MMR vaccine unless there is a contraindication to the vaccine or there is laboratory evidence of immunity to each of the three diseases. A routine second dose of MMR vaccine should be obtained at least 28 days after the first dose for students attending postsecondary schools, health care workers, or international travelers. People who received inactivated measles vaccine or an unknown type of measles vaccine during 1963-1967 should receive 2 doses of MMR vaccine. People who received inactivated mumps vaccine or an unknown type of mumps vaccine before 1979 and are at high risk for mumps infection should consider immunization with 2 doses of MMR vaccine. For females of childbearing age, rubella immunity should be determined. If there is no evidence of immunity, females who are not pregnant should be vaccinated. If there is no evidence of immunity, females who are pregnant should delay immunization until after pregnancy. Unvaccinated health care workers born before 1957 who lack laboratory evidence of measles, mumps, or rubella immunity or laboratory confirmation of disease should consider measles and mumps immunization with 2 doses of MMR vaccine or rubella immunization with 1 dose of MMR vaccine.  Pneumococcal 13-valent conjugate (PCV13) vaccine. When indicated, a person who is uncertain of his immunization history and has no record of immunization should receive the PCV13 vaccine. All adults 65 years of age and older should receive this  vaccine. An adult aged 19 years or older who has certain medical conditions and has not been previously immunized should receive 1 dose of PCV13 vaccine. This PCV13 should be followed with a dose of pneumococcal polysaccharide (PPSV23) vaccine. Adults who are at high risk for pneumococcal disease should obtain the PPSV23 vaccine at least 8 weeks after the dose of PCV13 vaccine. Adults older than 79 years of age who have normal immune system function should obtain the PPSV23 vaccine dose at least 1 year after the dose of PCV13 vaccine.  Pneumococcal polysaccharide (PPSV23) vaccine. When PCV13 is also indicated, PCV13 should be obtained first. All adults aged 65 years and older should be immunized. An adult younger than age 65 years who has certain medical conditions should be immunized. Any person who resides in a nursing home or long-term care facility should be immunized. An adult smoker should be immunized. People with an immunocompromised condition and certain other conditions should receive both PCV13 and PPSV23 vaccines. People with human immunodeficiency virus (HIV) infection should be immunized as soon as possible after diagnosis. Immunization during chemotherapy or radiation therapy should be avoided. Routine use of PPSV23 vaccine is not recommended for American Indians, [place] Natives, or people younger than 65 years unless there are medical conditions that require PPSV23 vaccine. When indicated, people who have unknown immunization and have no record of immunization should receive PPSV23 vaccine. One-time revaccination 5 years after the first dose of PPSV23 is recommended for people aged 19-64 years who have chronic kidney failure, nephrotic syndrome, asplenia, or immunocompromised conditions. People who received 1-2 doses of PPSV23 before age 65 years should receive another dose of PPSV23 vaccine at age 65 years or later if at least 5 years have passed since the previous dose. Doses of PPSV23 are not  needed for people immunized with PPSV23 at or after age 65 years.  Meningococcal vaccine. Adults with asplenia or persistent complement component deficiencies should receive 2 doses of quadrivalent meningococcal conjugate (MenACWY-D) vaccine. The doses should be obtained   at least 2 months apart. Microbiologists working with certain meningococcal bacteria, [place] recruits, people at risk during an outbreak, and people who travel to or live in countries with a high rate of meningitis should be immunized. A first-year college student up through age 34 years who is living in a residence hall should receive a dose if she did not receive a dose on or after her 16th birthday. Adults who have certain high-risk conditions should receive one or more doses of vaccine.  Hepatitis A vaccine. Adults who wish to be protected from this disease, have certain high-risk conditions, work with hepatitis A-infected animals, work in hepatitis A research labs, or travel to or work in countries with a high rate of hepatitis A should be immunized. Adults who were previously unvaccinated and who anticipate close contact with an international adoptee during the first 60 days after arrival in the [place] from a country with a high rate of hepatitis A should be immunized.  Hepatitis B vaccine. Adults who wish to be protected from this disease, have certain high-risk conditions, may be exposed to blood or other infectious body fluids, are household contacts or sex partners of hepatitis B positive people, are clients or workers in certain care facilities, or travel to or work in countries with a high rate of hepatitis B should be immunized.  Haemophilus influenzae type b (Hib) vaccine. A previously unvaccinated person with asplenia or sickle cell disease or having a scheduled splenectomy should receive 1 dose of Hib vaccine. Regardless of previous immunization, a recipient of a hematopoietic stem cell transplant should receive a  3-dose series 6-12 months after her successful transplant. Hib vaccine is not recommended for adults with HIV infection. Preventive Services / Frequency Ages 35 to 4 years  Blood pressure check.** / Every 3-5 years.  Lipid and cholesterol check.** / Every 5 years beginning at age 60.  Clinical breast exam.** / Every 3 years for women in their 71s and 10s.  BRCA-related cancer risk assessment.** / For women who have family members with a BRCA-related cancer (breast, ovarian, tubal, or peritoneal cancers).  Pap test.** / Every 2 years from ages 76 through 26. Every 3 years starting at age 61 through age 76 or 93 with a history of 3 consecutive normal Pap tests.  HPV screening.** / Every 3 years from ages 37 through ages 60 to 51 with a history of 3 consecutive normal Pap tests.  Hepatitis C blood test.** / For any individual with known risks for hepatitis C.  Skin self-exam. / Monthly.  Influenza vaccine. / Every year.  Tetanus, diphtheria, and acellular pertussis (Tdap, Td) vaccine.** / Consult your health care provider. Pregnant women should receive 1 dose of Tdap vaccine during each pregnancy. 1 dose of Td every 10 years.  Varicella vaccine.** / Consult your health care provider. Pregnant females who do not have evidence of immunity should receive the first dose after pregnancy.  HPV vaccine. / 3 doses over 6 months, if 93 and younger. The vaccine is not recommended for use in pregnant females. However, pregnancy testing is not needed before receiving a dose.  Measles, mumps, rubella (MMR) vaccine.** / You need at least 1 dose of MMR if you were born in 1957 or later. You may also need a 2nd dose. For females of childbearing age, rubella immunity should be determined. If there is no evidence of immunity, females who are not pregnant should be vaccinated. If there is no evidence of immunity, females who are  pregnant should delay immunization until after pregnancy.  Pneumococcal  13-valent conjugate (PCV13) vaccine.** / Consult your health care provider.  Pneumococcal polysaccharide (PPSV23) vaccine.** / 1 to 2 doses if you smoke cigarettes or if you have certain conditions.  Meningococcal vaccine.** / 1 dose if you are age 68 to 8 years and a Market researcher living in a residence hall, or have one of several medical conditions, you need to get vaccinated against meningococcal disease. You may also need additional booster doses.  Hepatitis A vaccine.** / Consult your health care provider.  Hepatitis B vaccine.** / Consult your health care provider.  Haemophilus influenzae type b (Hib) vaccine.** / Consult your health care provider. Ages 7 to 53 years  Blood pressure check.** / Every year.  Lipid and cholesterol check.** / Every 5 years beginning at age 25 years.  Lung cancer screening. / Every year if you are aged 11-80 years and have a 30-pack-year history of smoking and currently smoke or have quit within the past 15 years. Yearly screening is stopped once you have quit smoking for at least 15 years or develop a health problem that would prevent you from having lung cancer treatment.  Clinical breast exam.** / Every year after age 48 years.  BRCA-related cancer risk assessment.** / For women who have family members with a BRCA-related cancer (breast, ovarian, tubal, or peritoneal cancers).  Mammogram.** / Every year beginning at age 41 years and continuing for as long as you are in good health. Consult with your health care provider.  Pap test.** / Every 3 years starting at age 65 years through age 37 or 70 years with a history of 3 consecutive normal Pap tests.  HPV screening.** / Every 3 years from ages 72 years through ages 60 to 40 years with a history of 3 consecutive normal Pap tests.  Fecal occult blood test (FOBT) of stool. / Every year beginning at age 21 years and continuing until age 5 years. You may not need to do this test if you get  a colonoscopy every 10 years.  Flexible sigmoidoscopy or colonoscopy.** / Every 5 years for a flexible sigmoidoscopy or every 10 years for a colonoscopy beginning at age 35 years and continuing until age 48 years.  Hepatitis C blood test.** / For all people born from [birth year] through 1965 and any individual with known risks for hepatitis C.  Skin self-exam. / Monthly.  Influenza vaccine. / Every year.  Tetanus, diphtheria, and acellular pertussis (Tdap/Td) vaccine.** / Consult your health care provider. Pregnant women should receive 1 dose of Tdap vaccine during each pregnancy. 1 dose of Td every 10 years.  Varicella vaccine.** / Consult your health care provider. Pregnant females who do not have evidence of immunity should receive the first dose after pregnancy.  Zoster vaccine.** / 1 dose for adults aged 30 years or older.  Measles, mumps, rubella (MMR) vaccine.** / You need at least 1 dose of MMR if you were born in 1957 or later. You may also need a second dose. For females of childbearing age, rubella immunity should be determined. If there is no evidence of immunity, females who are not pregnant should be vaccinated. If there is no evidence of immunity, females who are pregnant should delay immunization until after pregnancy.  Pneumococcal 13-valent conjugate (PCV13) vaccine.** / Consult your health care provider.  Pneumococcal polysaccharide (PPSV23) vaccine.** / 1 to 2 doses if you smoke cigarettes or if you have certain conditions.  Meningococcal vaccine.** /  Consult your health care provider.  Hepatitis A vaccine.** / Consult your health care provider.  Hepatitis B vaccine.** / Consult your health care provider.  Haemophilus influenzae type b (Hib) vaccine.** / Consult your health care provider. Ages 64 years and over  Blood pressure check.** / Every year.  Lipid and cholesterol check.** / Every 5 years beginning at age 23 years.  Lung cancer screening. / Every year if you  are aged 16-80 years and have a 30-pack-year history of smoking and currently smoke or have quit within the past 15 years. Yearly screening is stopped once you have quit smoking for at least 15 years or develop a health problem that would prevent you from having lung cancer treatment.  Clinical breast exam.** / Every year after age 74 years.  BRCA-related cancer risk assessment.** / For women who have family members with a BRCA-related cancer (breast, ovarian, tubal, or peritoneal cancers).  Mammogram.** / Every year beginning at age 44 years and continuing for as long as you are in good health. Consult with your health care provider.  Pap test.** / Every 3 years starting at age 58 years through age 22 or 39 years with 3 consecutive normal Pap tests. Testing can be stopped between 65 and 70 years with 3 consecutive normal Pap tests and no abnormal Pap or HPV tests in the past 10 years.  HPV screening.** / Every 3 years from ages 64 years through ages 70 or 61 years with a history of 3 consecutive normal Pap tests. Testing can be stopped between 65 and 70 years with 3 consecutive normal Pap tests and no abnormal Pap or HPV tests in the past 10 years.  Fecal occult blood test (FOBT) of stool. / Every year beginning at age 40 years and continuing until age 27 years. You may not need to do this test if you get a colonoscopy every 10 years.  Flexible sigmoidoscopy or colonoscopy.** / Every 5 years for a flexible sigmoidoscopy or every 10 years for a colonoscopy beginning at age 7 years and continuing until age 32 years.  Hepatitis C blood test.** / For all people born from [birth year] through 1963 and any individual with known risks for hepatitis C.  Osteoporosis screening.** / A one-time screening for women ages 30 years and over and women at risk for fractures or osteoporosis.  Skin self-exam. / Monthly.  Influenza vaccine. / Every year.  Tetanus, diphtheria, and acellular pertussis (Tdap/Td)  vaccine.** / 1 dose of Td every 10 years.  Varicella vaccine.** / Consult your health care provider.  Zoster vaccine.** / 1 dose for adults aged 35 years or older.  Pneumococcal 13-valent conjugate (PCV13) vaccine.** / Consult your health care provider.  Pneumococcal polysaccharide (PPSV23) vaccine.** / 1 dose for all adults aged 46 years and older.  Meningococcal vaccine.** / Consult your health care provider.  Hepatitis A vaccine.** / Consult your health care provider.  Hepatitis B vaccine.** / Consult your health care provider.  Haemophilus influenzae type b (Hib) vaccine.** / Consult your health care provider. ** Family history and personal history of risk and conditions may change your health care provider's recommendations.   This information is not intended to replace advice given to you by your health care provider. Make sure you discuss any questions you have with your health care provider.   Document Released: 07/07/2001 Document Revised: 06/01/2014 Document Reviewed: 10/06/2010 Elsevier Interactive Patient Education [employer].

## 2016-01-30 NOTE — Progress Notes (Signed)
Subjective:  Patient ID: Nichole Silva, female    DOB: [birth year]  Age: 81 y.o. MRN: [id number]  CC: Annual Exam; Hypertension; and Diabetes   HPI Nichole Silva presents for a CPX/AWV.  She tells me her blood pressures been well controlled on the combination of losartan, nifedipine, and Lasix. She has had no recent episodes of headache/blurred vision/chest pain/shortness of breath/palpitations/edema/or fatigue.  She also thinks her blood sugars have been well controlled.  Her lung symptoms have been well controlled by using Breo. She denies any recent episodes of cough, wheezing, shortness of breath, or DOE.    Past Medical History:  Diagnosis Date  . Allergic rhinitis, cause unspecified   . Anxiety state, unspecified   . Asthma   . Coronary atherosclerosis of unspecified type of vessel, native or graft   . Depression   . Hyperlipidemia   . IBS (irritable bowel syndrome)   . Osteoarthrosis, unspecified whether generalized or localized, unspecified site   . Type II or unspecified type diabetes mellitus without mention of complication, not stated as uncontrolled   . Unspecified chronic bronchitis ([place])   . Unspecified essential hypertension   . Unspecified hearing loss   . Unspecified venous (peripheral) insufficiency   . Unspecified vitamin D deficiency    Past Surgical History:  Procedure Laterality Date  . Cataract surg  2009  . CHOLECYSTECTOMY  1997  . DILATION AND CURETTAGE OF UTERUS  1960's  . Left elbow surgery  1992    reports that she has never smoked. She has never used smokeless tobacco. She reports that she does not drink alcohol or use drugs. family history includes Colon cancer in her sister; Emphysema in her mother; Heart disease in her father. Allergies  Allergen Reactions  . Codeine     Unknown per pt   . Metformin And Related     N&V  . Penicillins     REACTION: ITCHING AND SWELLING    Outpatient Medications Prior to Visit  Medication Sig  Dispense Refill  . acetaminophen (TYLENOL) 325 MG tablet Take 325 mg by mouth every 6 (six) hours as needed for mild pain or moderate pain. Pain/headache    . ALPRAZolam (XANAX) 0.5 MG tablet TAKE 1/2 TO 1 TABLET BY MOUTH 3 TIMES A DAY AS NEEDED 90 tablet 3  . aspirin 81 MG tablet Take 81 mg by mouth daily.      Marland Kitchen atorvastatin (LIPITOR) 40 MG tablet TAKE 1 TABLET (40 MG TOTAL) BY MOUTH DAILY AT 6 PM. 90 tablet 3  . cetirizine (ZYRTEC) 10 MG tablet Take 10 mg by mouth daily.      Marland Kitchen dicyclomine (BENTYL) 10 MG capsule TAKE ONE CAPSULE BY MOUTH 3 TIMES A DAY BEFORE MEALS 90 capsule 11  . fluticasone (FLONASE) 50 MCG/ACT nasal spray USE 2 SPRAYS IN EACH NOSTRIL AT BEDTIME 16 g 11  . fluticasone furoate-vilanterol (BREO ELLIPTA) 200-25 MCG/INH AEPB Inhale 1 puff into the lungs daily. 30 each 11  . furosemide (LASIX) 40 MG tablet TAKE 1 TO 2 TABLETS BY MOUTH EVERY DAY 30 tablet 11  . losartan (COZAAR) 50 MG tablet Take 1 tablet (50 mg total) by mouth daily. Overdue for yearly physical must see Md for refills 90 tablet 0  . NIFEdipine (PROCARDIA-XL/ADALAT CC) 60 MG 24 hr tablet Take 1 tablet (60 mg total) by mouth daily. Yearly physical is due must see MD for future refills 90 tablet 0  . PROAIR HFA 108 (90 Base) MCG/ACT inhaler INHALE 1  TO 2 PUFFS BY MOUTH EVERY 4 HOURS AS NEEDED FOR WHEEZE OR SHORTNESS OF BREATH 8.5 Inhaler 11  . Vitamin D, Ergocalciferol, (DRISDOL) 50000 UNITS CAPS capsule TAKE ONE CAPSULE BY MOUTH EVEY MONDAY 4 capsule 11  . WELCHOL 625 MG tablet TAKE 2 TABLETS BY MOUTH EVERY DAY 60 tablet 11   No facility-administered medications prior to visit.     ROS Review of Systems  Constitutional: Negative.  Negative for appetite change, chills, diaphoresis, fatigue and fever.  HENT: Negative.  Negative for sinus pressure and trouble swallowing.   Eyes: Negative.  Negative for photophobia and visual disturbance.  Respiratory: Negative.  Negative for cough, choking, chest tightness,  shortness of breath and stridor.   Cardiovascular: Negative for chest pain, palpitations and leg swelling.  Gastrointestinal: Negative.  Negative for abdominal pain, blood in stool, constipation, diarrhea and nausea.  Endocrine: Negative.  Negative for polydipsia, polyphagia and polyuria.  Genitourinary: Negative.  Negative for difficulty urinating and dysuria.  Musculoskeletal: Negative for arthralgias, back pain, joint swelling, myalgias and neck pain.  Skin: Negative.  Negative for color change and rash.  Allergic/Immunologic: Negative.   Neurological: Negative.  Negative for dizziness, tremors, weakness, numbness and headaches.  Hematological: Negative.  Negative for adenopathy. Does not bruise/bleed easily.  Psychiatric/Behavioral: Negative for confusion, decreased concentration, dysphoric mood, sleep disturbance and suicidal ideas. The patient is nervous/anxious.     Objective:  BP 130/80 (BP Location: Left Arm, Patient Position: Sitting, Cuff Size: Normal)   Pulse 72   Temp 97.8 F (36.6 C) (Oral)   Resp 16   Ht 5\' 4"  (1.626 m)   Wt 173 lb (78.5 kg)   SpO2 94%   BMI 29.70 kg/m   BP Readings from Last 3 Encounters:  01/30/16 130/80  07/29/15 138/72  07/08/15 140/70    Wt Readings from Last 3 Encounters:  01/30/16 173 lb (78.5 kg)  07/29/15 167 lb (75.8 kg)  07/08/15 165 lb 12.8 oz (75.2 kg)    Physical Exam  Constitutional: She is oriented to person, place, and time. No distress.  HENT:  Mouth/Throat: Oropharynx is clear and moist. No oropharyngeal exudate.  Eyes: Conjunctivae are normal. Right eye exhibits no discharge. Left eye exhibits no discharge. No scleral icterus.  Neck: Normal range of motion. Neck supple. No JVD present. No tracheal deviation present. No thyromegaly present.  Cardiovascular: Normal rate, regular rhythm, normal heart sounds and intact distal pulses.  Exam reveals no gallop and no friction rub.   No murmur heard. Pulmonary/Chest: Effort  normal and breath sounds normal. No stridor. No respiratory distress. She has no wheezes. She has no rales. She exhibits no tenderness.  Abdominal: Soft. Bowel sounds are normal. She exhibits no distension and no mass. There is no tenderness. There is no rebound.  Genitourinary:  Genitourinary Comments: Breasts, GU, and rectal exams were deferred at her request as she tells me she sees a gynecologist annually.  Musculoskeletal: Normal range of motion. She exhibits no edema, tenderness or deformity.  Lymphadenopathy:    She has no cervical adenopathy.  Neurological: She is oriented to person, place, and time.  Skin: Skin is warm and dry. No rash noted. She is not diaphoretic. No erythema. No pallor.  Psychiatric: She has a normal mood and affect. Her behavior is normal. Judgment and thought content normal.  Vitals reviewed.   Lab Results  Component Value Date   WBC 9.1 01/29/2015   HGB 14.0 01/29/2015   HCT 41.9 01/29/2015   PLT  295.0 01/29/2015   GLUCOSE 136 (H) 07/29/2015   CHOL 183 07/29/2015   TRIG 202.0 (H) 07/29/2015   HDL 47.30 07/29/2015   LDLDIRECT 102.0 07/29/2015   LDLCALC 82 09/14/2013   ALT 15 06/12/2013   AST 18 06/12/2013   NA 139 07/29/2015   K 4.0 07/29/2015   CL 104 07/29/2015   CREATININE 0.75 07/29/2015   BUN 9 07/29/2015   CO2 27 07/29/2015   TSH 3.68 09/14/2013   HGBA1C 6.6 (H) 07/29/2015   MICROALBUR <0.7 01/29/2015    No results found.  Assessment & Plan:   Nichole Silva was seen today for annual exam, hypertension and diabetes.  Diagnoses and all orders for this visit:  Essential hypertension- Her blood pressure is well-controlled, electrolytes and renal function are stable. -     Basic metabolic panel; Future -     Urinalysis, Routine w reflex microscopic (not at [facility]); Future  Type 2 diabetes mellitus with complication, without long-term current use of insulin ([place])- her blood sugars are adequately well controlled, no medical therapy is indicated at  this time. -     Basic metabolic panel; Future -     Hemoglobin A1c; Future -     Microalbumin / creatinine urine ratio; Future  Need for prophylactic vaccination and inoculation against influenza -     Flu vaccine HIGH DOSE PF (Fluzone High dose)  Routine general medical examination at a health care facility  Asthma, moderate persistent, uncomplicated- she is doing well on the LABA/ICS combination, will continue.   I am having Nichole Silva maintain her aspirin, cetirizine, acetaminophen, furosemide, WELCHOL, fluticasone, Vitamin D (Ergocalciferol), dicyclomine, PROAIR HFA, fluticasone furoate-vilanterol, atorvastatin, ALPRAZolam, losartan, and NIFEdipine.  No orders of the defined types were placed in this encounter.  See AVS for instructions about healthy living and anticipatory guidance.  Follow-up: No Follow-up on file.  Scarlette Calico, MD

## 2016-01-30 NOTE — Progress Notes (Signed)
Pre visit review using our clinic review tool, if applicable. No additional management support is needed unless otherwise documented below in the visit note. 

## 2016-02-19 ENCOUNTER — Other Ambulatory Visit: Payer: Self-pay | Admitting: Internal Medicine

## 2016-02-20 ENCOUNTER — Other Ambulatory Visit: Payer: Self-pay | Admitting: Internal Medicine

## 2016-02-20 NOTE — Telephone Encounter (Signed)
rx faxed to pharmacy ([facility] on [address]).

## 2016-02-24 ENCOUNTER — Other Ambulatory Visit: Payer: Self-pay | Admitting: Internal Medicine

## 2016-02-26 ENCOUNTER — Telehealth: Payer: Self-pay

## 2016-02-26 NOTE — Telephone Encounter (Signed)
She can just stop the welchol It does not need to be replaced

## 2016-02-26 NOTE — Telephone Encounter (Signed)
[facility]ax from [facility]: Rq for change in rx due to cost.   Welchol changed to Colestipol HCL 1 gm tab   Please advise

## 2016-02-27 NOTE — Telephone Encounter (Signed)
Pt informed of MD response.  

## 2016-03-31 ENCOUNTER — Other Ambulatory Visit: Payer: Self-pay | Admitting: Internal Medicine

## 2016-03-31 DIAGNOSIS — E1165 Type 2 diabetes mellitus with hyperglycemia: Secondary | ICD-10-CM

## 2016-03-31 DIAGNOSIS — I10 Essential (primary) hypertension: Secondary | ICD-10-CM

## 2016-03-31 DIAGNOSIS — IMO0002 Reserved for concepts with insufficient information to code with codable children: Secondary | ICD-10-CM

## 2016-05-26 ENCOUNTER — Encounter (HOSPITAL_COMMUNITY): Payer: Self-pay | Admitting: *Deleted

## 2016-05-26 ENCOUNTER — Emergency Department (HOSPITAL_COMMUNITY)
Admission: EM | Admit: 2016-05-26 | Discharge: 2016-05-27 | Disposition: A | Discharge status: Home / Self Care | Payer: Medicare Other | Attending: Emergency Medicine | Admitting: Emergency Medicine

## 2016-05-26 DIAGNOSIS — M549 Dorsalgia, unspecified: Secondary | ICD-10-CM | POA: Insufficient documentation

## 2016-05-26 DIAGNOSIS — R930 Abnormal findings on diagnostic imaging of skull and head, not elsewhere classified: Secondary | ICD-10-CM | POA: Insufficient documentation

## 2016-05-26 DIAGNOSIS — I10 Essential (primary) hypertension: Secondary | ICD-10-CM | POA: Diagnosis not present

## 2016-05-26 DIAGNOSIS — Z79899 Other long term (current) drug therapy: Secondary | ICD-10-CM | POA: Insufficient documentation

## 2016-05-26 DIAGNOSIS — E119 Type 2 diabetes mellitus without complications: Secondary | ICD-10-CM | POA: Insufficient documentation

## 2016-05-26 DIAGNOSIS — N390 Urinary tract infection, site not specified: Secondary | ICD-10-CM

## 2016-05-26 DIAGNOSIS — Y999 Unspecified external cause status: Secondary | ICD-10-CM | POA: Insufficient documentation

## 2016-05-26 DIAGNOSIS — S3991XA Unspecified injury of abdomen, initial encounter: Secondary | ICD-10-CM | POA: Diagnosis not present

## 2016-05-26 DIAGNOSIS — Z7982 Long term (current) use of aspirin: Secondary | ICD-10-CM | POA: Insufficient documentation

## 2016-05-26 DIAGNOSIS — Y939 Activity, unspecified: Secondary | ICD-10-CM | POA: Diagnosis not present

## 2016-05-26 DIAGNOSIS — W03XXXA Other fall on same level due to collision with another person, initial encounter: Secondary | ICD-10-CM | POA: Diagnosis not present

## 2016-05-26 DIAGNOSIS — Y929 Unspecified place or not applicable: Secondary | ICD-10-CM | POA: Insufficient documentation

## 2016-05-26 DIAGNOSIS — J45909 Unspecified asthma, uncomplicated: Secondary | ICD-10-CM | POA: Diagnosis not present

## 2016-05-26 DIAGNOSIS — R1010 Upper abdominal pain, unspecified: Secondary | ICD-10-CM | POA: Diagnosis present

## 2016-05-26 DIAGNOSIS — S0990XA Unspecified injury of head, initial encounter: Secondary | ICD-10-CM | POA: Diagnosis not present

## 2016-05-26 DIAGNOSIS — W19XXXA Unspecified fall, initial encounter: Secondary | ICD-10-CM

## 2016-05-26 LAB — URINALYSIS, ROUTINE W REFLEX MICROSCOPIC
Bilirubin Urine: NEGATIVE
Glucose, UA: NEGATIVE mg/dL
Hgb urine dipstick: NEGATIVE
Ketones, ur: NEGATIVE mg/dL
Nitrite: NEGATIVE
PROTEIN: NEGATIVE mg/dL
Specific Gravity, Urine: 1.013 (ref 1.005–1.030)
pH: 5 (ref 5.0–8.0)

## 2016-05-26 NOTE — ED Provider Notes (Signed)
[facility] DEPT Provider Note   CSN: [id number] Arrival date & time: 05/26/16  1723  By signing my name below, I, Ephriam Jenkins, attest that this documentation has been prepared under the direction and in the presence of No att. providers found. Electronically signed, Ephriam Jenkins, ED Scribe. 06/01/16. 11:09 PM.  History   Chief Complaint Chief Complaint  Patient presents with  . Fall    HPI HPI Comments: Nichole Silva is a 82 y.o. female who presents to the Emergency Department s/p a fall that occurred approximately 1230 today. Pt's husband was in the attic to check an air filter and was coming down the ladder. The bottom step of the ladder was broken and husband fell after stepping down and knocked the pt forward, landing on her stomach. Pt was unable to stand up s/p and daughter was able to help her up. Daughter states that the pt was in significant pain immediately after. Pt also stated that she felt like she was having an Asthma attack afterwards and used her inhaler and took 1/2 of a Xanax, these symptoms resolved shortly after. She had difficulty ambulating immediately s/p but has ambulated since the incident. She notes worst pain to lower back and to her upper abdomen. Pt reports, "I'm not in any pain, I'm just sore". She also complains of abdominal distention and states that it "feels firmer". She states that this distention feels similar to previous distention after her Cholecystectomy. Pt also notes pain "around her waist". She has Hx of bladder prolapse and states concerns for this worsening. Pt denies any pain in the area. No head injury. No LOC.   HPI  Past Medical History:  Diagnosis Date  . Allergic rhinitis, cause unspecified   . Anxiety state, unspecified   . Asthma   . Coronary atherosclerosis of unspecified type of vessel, native or graft   . Depression   . Hyperlipidemia   . IBS (irritable bowel syndrome)   . Osteoarthrosis, unspecified whether generalized or  localized, unspecified site   . Type II or unspecified type diabetes mellitus without mention of complication, not stated as uncontrolled   . Unspecified chronic bronchitis   . Unspecified essential hypertension   . Unspecified hearing loss   . Unspecified venous (peripheral) insufficiency   . Unspecified vitamin D deficiency     Patient Active Problem List   Diagnosis Date Noted  . Frequent falls 06/01/2016  . Chest wall pain 06/01/2016  . Eczema 05/28/2016  . Asthma, moderate persistent 07/29/2015  . IBS (irritable bowel syndrome) 01/30/2015  . Increased endometrial stripe thickness 09/05/2014  . Pessary maintenance 10/25/2013  . Cystocele with uterine prolapse 10/25/2013  . Anxiety 10/06/2013  . Hypertriglyceridemia 09/14/2013  . Routine general medical examination at a health care facility 09/14/2013  . Vitamin D deficiency 02/25/2009  . DEGENERATIVE JOINT DISEASE 02/27/2008  . Type II diabetes mellitus with manifestations ([place]) 09/02/2007  . Hyperlipidemia with target LDL less than 100 07/05/2007  . Essential hypertension 07/05/2007  . Coronary atherosclerosis 07/05/2007    Past Surgical History:  Procedure Laterality Date  . Cataract surg  2009  . CHOLECYSTECTOMY  1997  . DILATION AND CURETTAGE OF UTERUS  1960's  . Left elbow surgery  1992    OB History    Gravida Para Term Preterm AB Living   6 4 3 1 2 4    SAB TAB Ectopic Multiple Live Births   2       4  Home Medications    Prior to Admission medications   Medication Sig Start Date End Date Taking? Authorizing Provider  acetaminophen (TYLENOL) 325 MG tablet Take 325 mg by mouth every 6 (six) hours as needed for mild pain or moderate pain. Pain/headache   Yes Historical Provider, MD  ALPRAZolam (XANAX) 0.5 MG tablet TAKE 1/2 TO 1 TABLET BY MOUTH 3 TIMES A DAY AS NEEDED 02/20/16  Yes Janith Lima, MD  aspirin 81 MG tablet Take 81 mg by mouth daily.     Yes Historical Provider, MD  atorvastatin  (LIPITOR) 40 MG tablet TAKE 1 TABLET (40 MG TOTAL) BY MOUTH DAILY AT 6 PM. 08/02/15  Yes Jerline Pain, MD  cetirizine (ZYRTEC) 10 MG tablet Take 10 mg by mouth daily.     Yes Historical Provider, MD  fluticasone furoate-vilanterol (BREO ELLIPTA) 200-25 MCG/INH AEPB Inhale 1 puff into the lungs daily. 07/29/15  Yes Janith Lima, MD  losartan (COZAAR) 50 MG tablet TAKE 1 TABLET (50 MG TOTAL) BY MOUTH DAILY. OVERDUE FOR YEARLY PHYSICAL MUST SEE MD FOR REFILLS 04/01/16  Yes Janith Lima, MD  NIFEdipine (PROCARDIA XL/ADALAT-CC) 60 MG 24 hr tablet TAKE 1 TABLET (60 MG TOTAL) BY MOUTH DAILY. YEARLY PHYSICAL IS DUE MUST SEE MD FOR FUTURE REFILLS 04/01/16  Yes Janith Lima, MD  PROAIR HFA 108 [phone] Base) MCG/ACT inhaler INHALE 1 TO 2 PUFFS BY MOUTH EVERY 4 HOURS AS NEEDED FOR WHEEZE OR SHORTNESS OF BREATH 06/05/15  Yes Janith Lima, MD  Vitamin D, Ergocalciferol, (DRISDOL) 50000 UNITS CAPS capsule TAKE ONE CAPSULE BY MOUTH EVEY MONDAY 04/30/15  Yes Janith Lima, MD  dicyclomine (BENTYL) 10 MG capsule TAKE ONE CAPSULE BY MOUTH 3 TIMES A DAY BEFORE MEALS 06/01/16   Janith Lima, MD  fluocinonide-emollient (LIDEX-E) 0.05 % cream Apply 1 application topically 2 (two) times daily. 05/28/16   Janith Lima, MD  fluticasone [facility]) 50 MCG/ACT nasal spray USE 2 SPRAYS IN EACH NOSTRIL AT BEDTIME 06/01/16   Janith Lima, MD  furosemide (LASIX) 40 MG tablet TAKE 1 TO 2 TABLETS BY MOUTH EVERY DAY 01/24/14   Janith Lima, MD    Family History Family History  Problem Relation Age of Onset  . Emphysema Mother   . Heart disease Father   . Colon cancer Sister   . Colon cancer    . Pancreatic cancer      Social History Social History  Substance Use Topics  . Smoking status: Never Smoker  . Smokeless tobacco: Never Used  . Alcohol use No     Allergies   Codeine; Metformin and related; and Penicillins   Review of Systems Review of Systems Pt denies nausea, emesis, fevers, chills, chest pains, shortness  of breath, headaches, abdominal pain, uti like symptoms.   Physical Exam Updated Vital Signs BP 129/69 (BP Location: Right Arm)   Pulse 77   Temp 97.9 F (36.6 C) (Oral)   Resp 20   Wt 173 lb (78.5 kg)   SpO2 94%   BMI 29.70 kg/m   Physical Exam  No hematoma to the face or signs of ecchymosis.  No midline tenderness but pt does have left sided paraspinal tenderness. Lower extremities: no gross deformity or tenderness. Pelvis stable Right hip tenderness noted. Abdomen appears distended to echymosis aprpeciated Pt has tenderness of upper parts of her abdomen  No flank tenderness.  ED Treatments / Results  Labs (all labs ordered are listed, but only  abnormal results are displayed) Labs Reviewed  URINALYSIS, ROUTINE W REFLEX MICROSCOPIC - Abnormal; Notable for the following:       Result Value   APPearance HAZY (*)    Leukocytes, UA MODERATE (*)    Bacteria, UA MANY (*)    Squamous Epithelial / LPF 0-5 (*)    All other components within normal limits    EKG  EKG Interpretation None       Radiology Dg Abd Acute W/chest  Result Date: 06/01/2016 CLINICAL DATA:  Recent fall, pain with inhalation, RIGHT flank pain, history asthma, diabetes mellitus, hypertension, bronchitis EXAM: DG ABDOMEN ACUTE W/ 1V CHEST COMPARISON:  05/15/2013 FINDINGS: Normal heart size, mediastinal contours and pulmonary vascularity. Atherosclerotic calcification aorta. Bibasilar atelectasis. Eventration RIGHT diaphragm stable. No acute infiltrate, pleural effusion or pneumothorax. Bones demineralized. Increased stool in colon vertically RIGHT colon. No bowel dilatation, bowel wall thickening, or free intraperitoneal air. Surgical clips RIGHT upper quadrant likely reflect cholecystectomy. No urinary tract calcification. Osseous demineralization with levoconvex thoracolumbar scoliosis. IMPRESSION: Bibasilar atelectasis. Increased stool in colon. No additional acute abdominal findings. Electronically  Signed   By: Lavonia Dana M.D.   On: 06/01/2016 10:28    Procedures Procedures (including critical care time)  Medications Ordered in ED Medications  cephALEXin (KEFLEX) capsule 500 mg (500 mg Oral Given 05/27/16 0207)     Initial Impression / Assessment and Plan / ED Course  I have reviewed the triage vital signs and the nursing notes.  Pertinent labs & imaging results that were available during my care of the patient were reviewed by me and considered in my medical decision making (see chart for details).  Clinical Course   2:05 AM: Pt and family informed of CT results and urinalysis results. She agrees to treatment plan with abx.    Final Clinical Impressions(s) / ED Diagnoses   Final diagnoses:  Fall, initial encounter  Lower urinary tract infectious disease   Pt comes in post fall. Pt is having abd pain. CT non contrast ordered. UA shows possible infection. We will d/c.Marland Kitchen Strict ER return precautions have been discussed, and patient is agreeing with the plan and is comfortable with the workup done and the recommendations from the ER.   New Prescriptions Discharge Medication List as of 05/27/2016  1:48 AM    START taking these medications   Details  cephALEXin (KEFLEX) 500 MG capsule Take 1 capsule (500 mg total) by mouth 4 (four) times daily., Starting Wed 05/27/2016, Print       I personally performed the services described in this documentation, which was scribed in my presence. The recorded information has been reviewed and is accurate.     Varney Biles, MD 06/01/16 2312

## 2016-05-26 NOTE — ED Triage Notes (Signed)
Her husband fell on her and caused her to fall forward onto her stomach and knocking the breath out of her.  Pt has had pain in her back, her stomach has been "feeling firmer" and she feels that her pre existing bladder prolapse "feels worse".  Pt has been able to void successfully since this accident.  No LOC.  Pt took some tylenol, xanax and inhaler after this accident which occurred around 12;30pm today.

## 2016-05-27 ENCOUNTER — Emergency Department (HOSPITAL_COMMUNITY): Payer: Medicare Other

## 2016-05-27 DIAGNOSIS — S3991XA Unspecified injury of abdomen, initial encounter: Secondary | ICD-10-CM | POA: Diagnosis not present

## 2016-05-27 DIAGNOSIS — S0990XA Unspecified injury of head, initial encounter: Secondary | ICD-10-CM | POA: Diagnosis not present

## 2016-05-27 DIAGNOSIS — R1010 Upper abdominal pain, unspecified: Secondary | ICD-10-CM | POA: Diagnosis not present

## 2016-05-27 IMAGING — CT CT HEAD W/O CM
4 series · 16 of 47 positions shown, 18 images · non-contrast
Comparison: None.

CLINICAL DATA: Fall, upper abdominal back pain. History of
hypertension, diabetes.

EXAM:
CT HEAD WITHOUT CONTRAST
TECHNIQUE: Contiguous axial images were obtained from the base of the skull
through the vertex without intravenous contrast.

[Series 2: head without · axial · non-contrast · 0.44mm/px · z∈[-150,-30]mm · 7 of 33 slices shown, 9 images]
[im 5/33  brain]
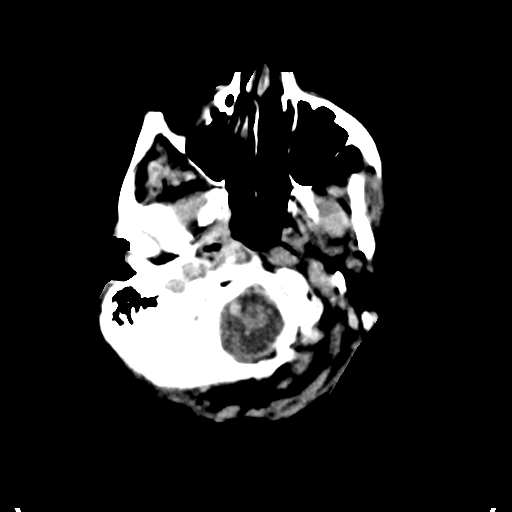
[im 5/33  bone]
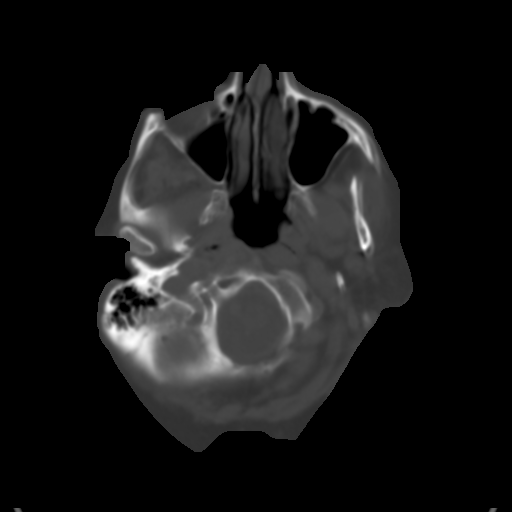
[im 9/33  brain]
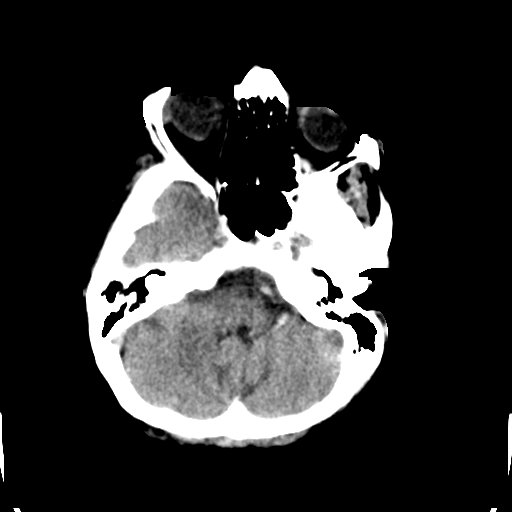
[im 13/33  brain]
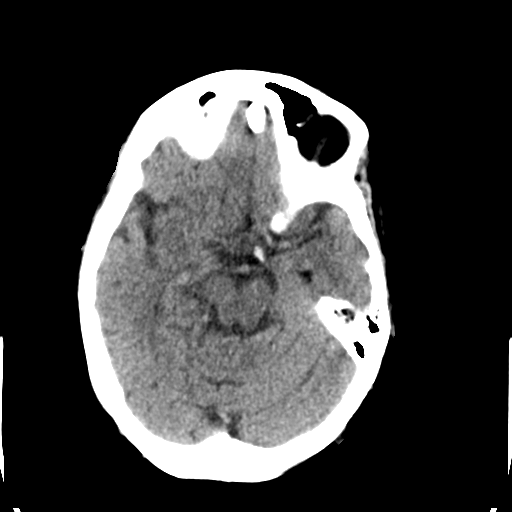
[im 17/33  brain]
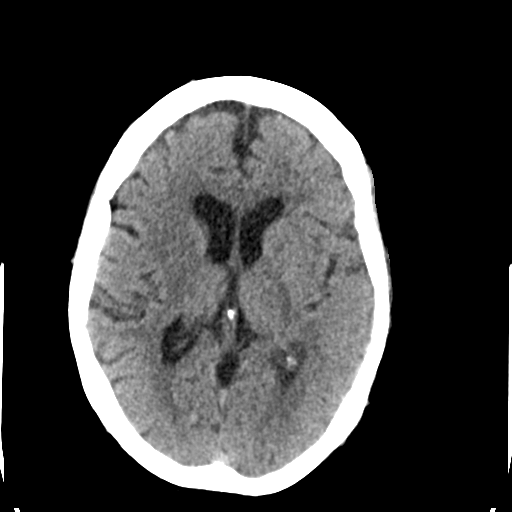
[im 21/33  brain]
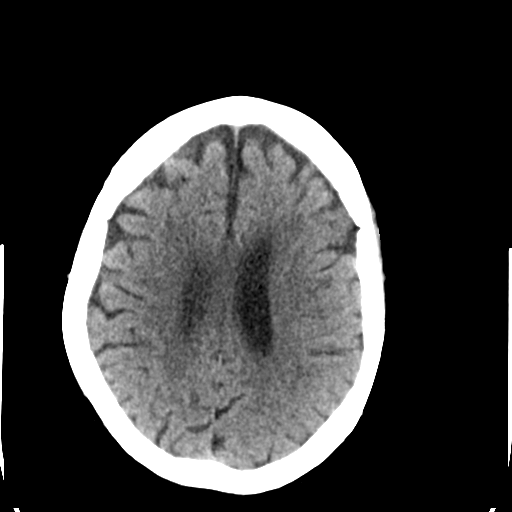
[im 21/33  bone]
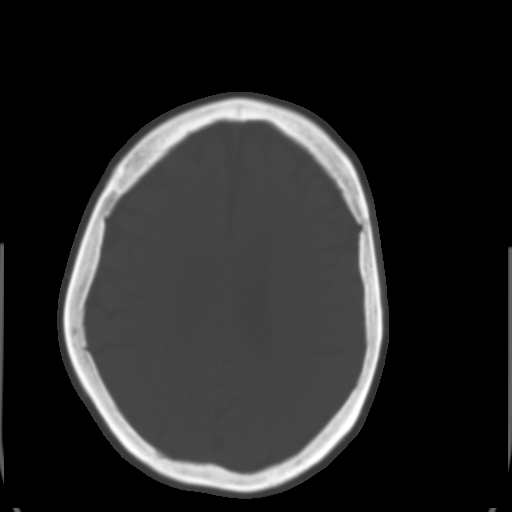
[im 25/33  brain]
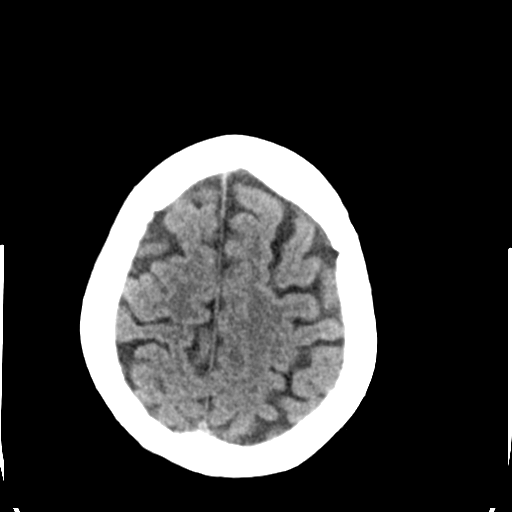
[im 29/33  brain]
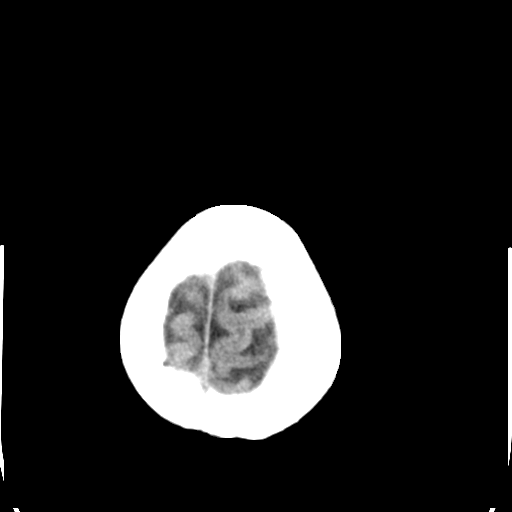

[Series 3: head bone · axial · 0.44mm/px · z∈[-154,-122]mm · 3 of 83 slices shown]
[im 9/83  bone]
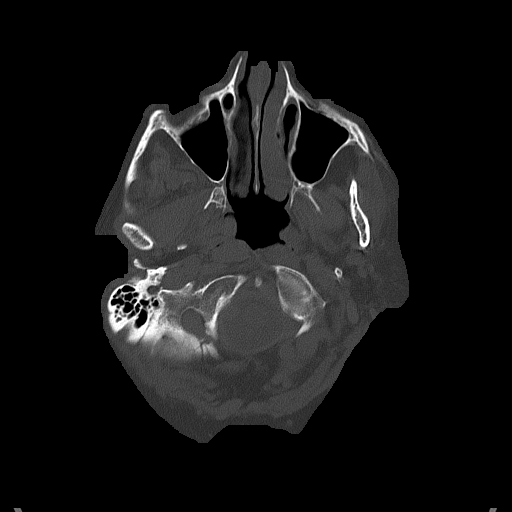
[im 17/83  bone]
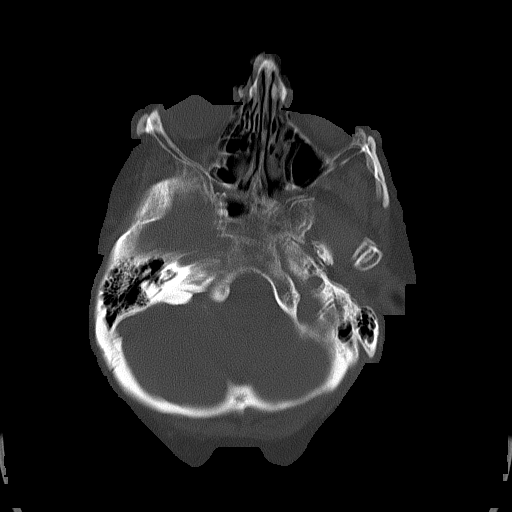
[im 25/83  bone]
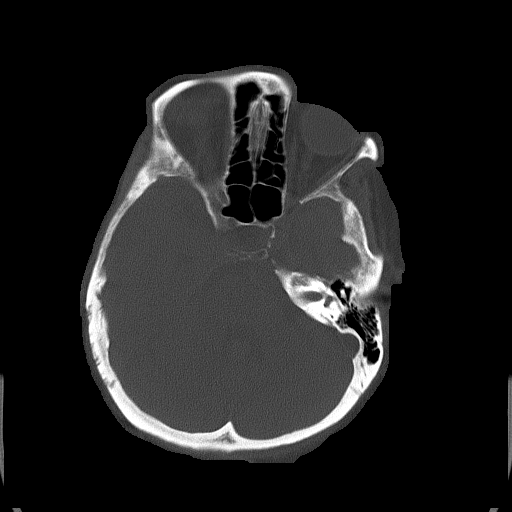

[Series 4: head without cor · coronal · non-contrast · 0.32mm/px · 3 of 67 slices shown]
[im 23/67  brain]
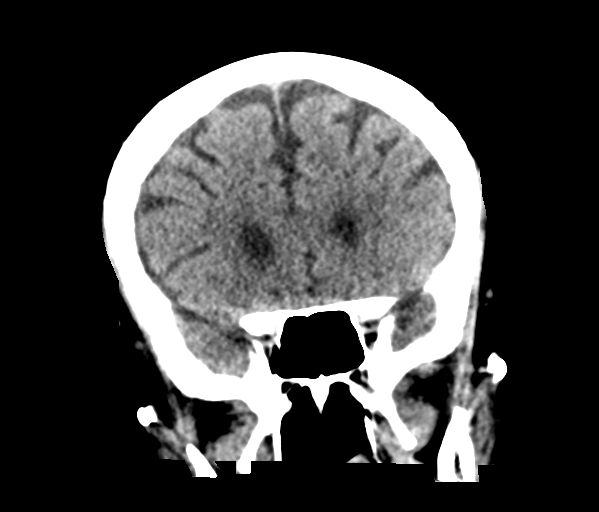
[im 30/67  brain]
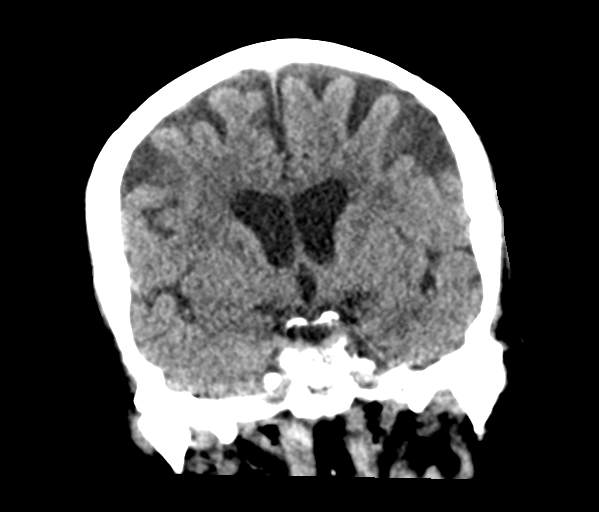
[im 37/67  brain]
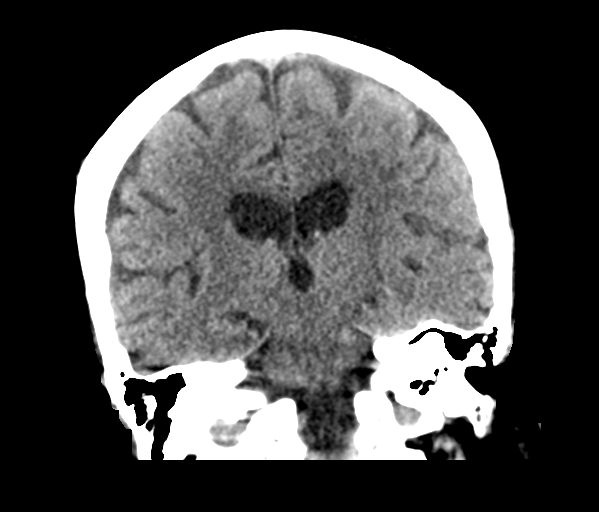

[Series 5: head without sag · sagittal · non-contrast · 0.32mm/px · 3 of 58 slices shown]
[im 20/58  brain]
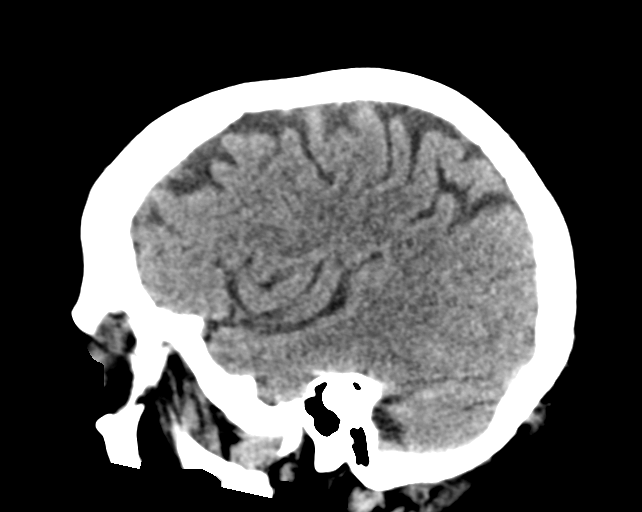
[im 29/58  brain]
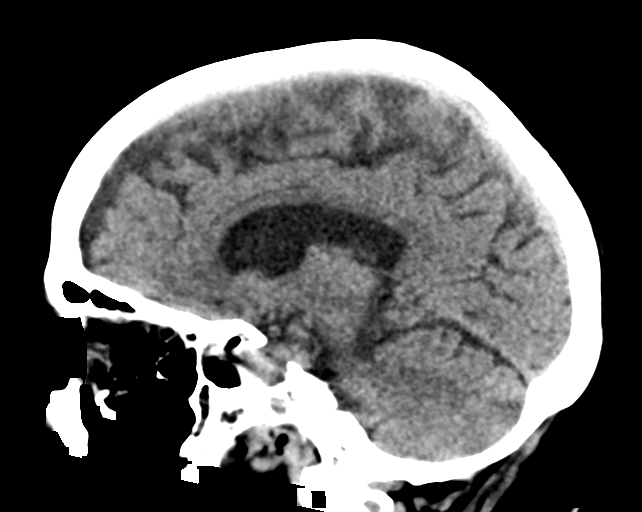
[im 39/58  brain]
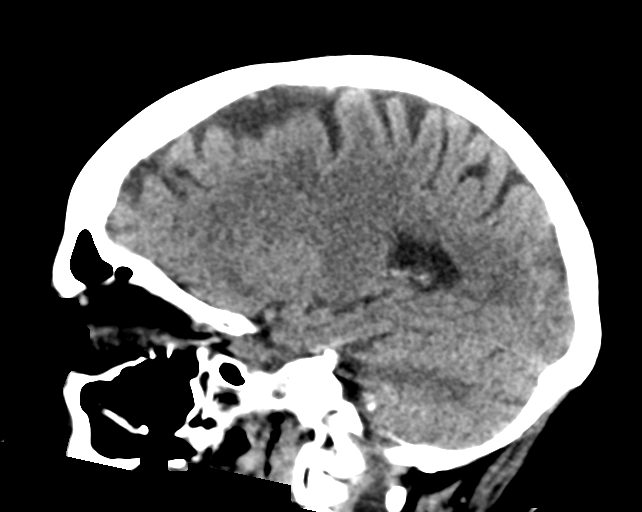

[16 of 47 positions shown; findings below may reference images not displayed]

FINDINGS: Mildly motion degraded examination.

BRAIN: The ventricles and sulci are normal for age. No
intraparenchymal hemorrhage, mass effect nor midline shift. Patchy
supratentorial white matter hypodensities less than expected for
patient's age, though non-specific are most compatible with chronic
small vessel ischemic disease. No acute large vascular territory
infarcts. No abnormal extra-axial fluid collections. Basal cisterns
are patent.

VASCULAR: Mild calcific atherosclerosis of the carotid siphons.

SKULL: No skull fracture. No significant scalp soft tissue swelling.

SINUSES/ORBITS: The mastoid air-cells and included paranasal sinuses
are well-aerated.The included ocular globes and orbital contents are
non-suspicious.

OTHER: None.
IMPRESSION: Negative mildly motion degraded CT HEAD for age.

## 2016-05-27 IMAGING — CT CT ABD-PELV W/O CM
2 of 4 series · 16 of 46 positions shown, 18 images · non-contrast
Comparison: None.

CLINICAL DATA: Patient's husband fell from ladder, landing on
patient. Fall, upper abdominal and back pain. History of
hypertension, diabetes.

EXAM:
CT ABDOMEN AND PELVIS WITHOUT CONTRAST
TECHNIQUE: Multidetector CT imaging of the abdomen and pelvis was performed
following the standard protocol without IV contrast.

[Series 2: a/p w/o 5mm · axial · non-contrast · 0.73mm/px · z∈[-866,-402]mm · 13 of 103 slices shown, 15 images]
[im 5/103  soft-tissue]
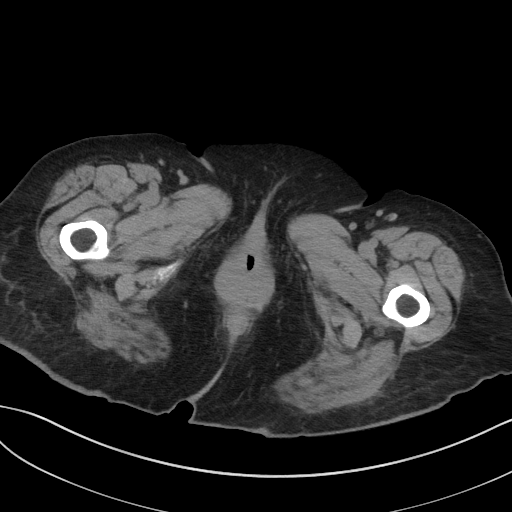
[im 5/103  bone]
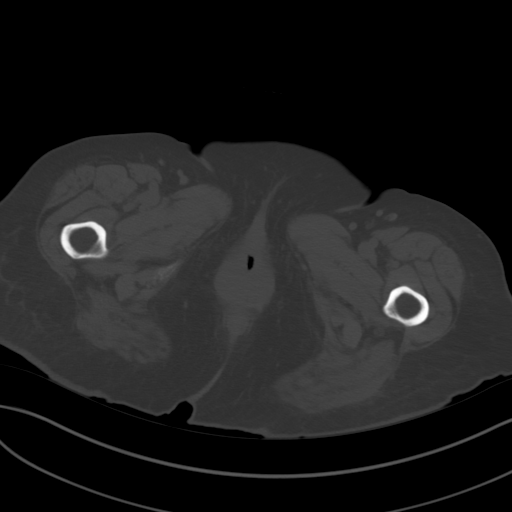
[im 13/103  soft-tissue]
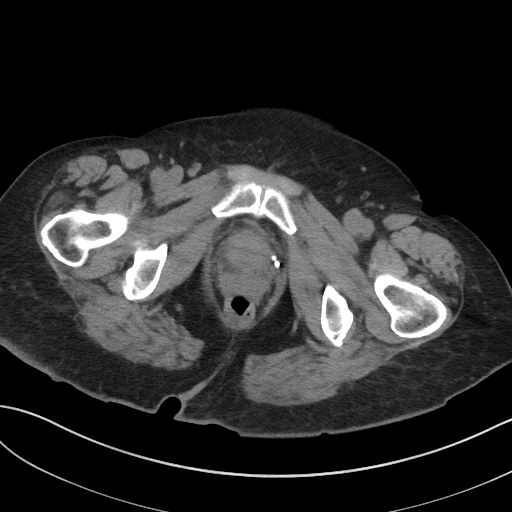
[im 22/103  soft-tissue]
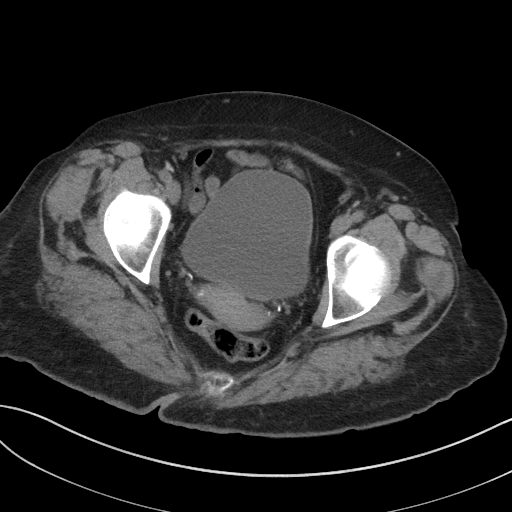
[im 30/103  soft-tissue]
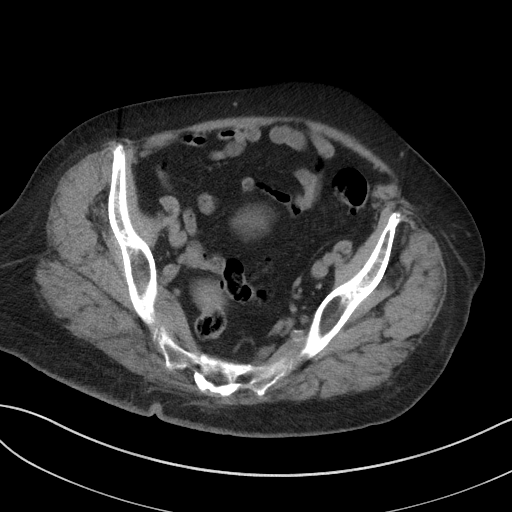
[im 35/103  soft-tissue]
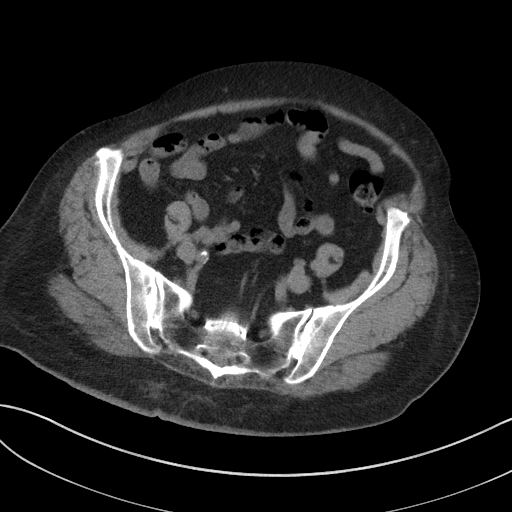
[im 43/103  soft-tissue]
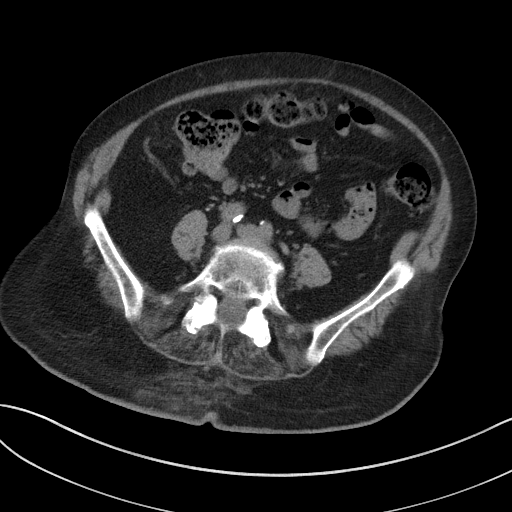
[im 52/103  soft-tissue]
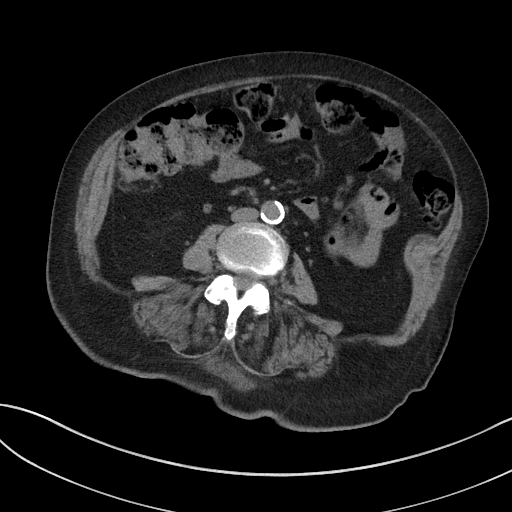
[im 60/103  soft-tissue]
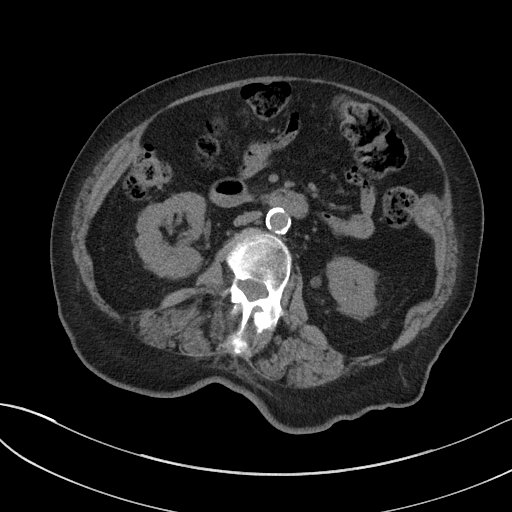
[im 69/103  soft-tissue]
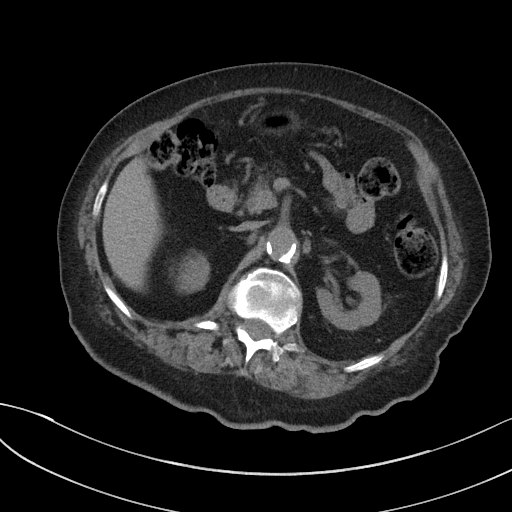
[im 69/103  bone]
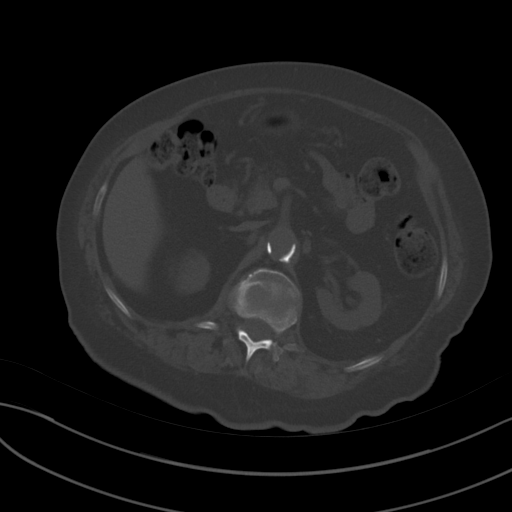
[im 73/103  soft-tissue]
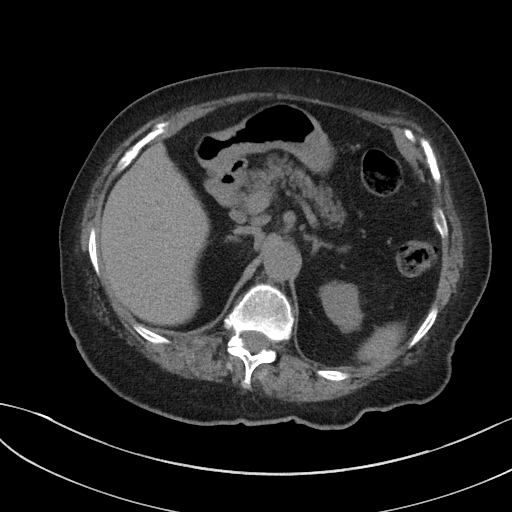
[im 81/103  soft-tissue]
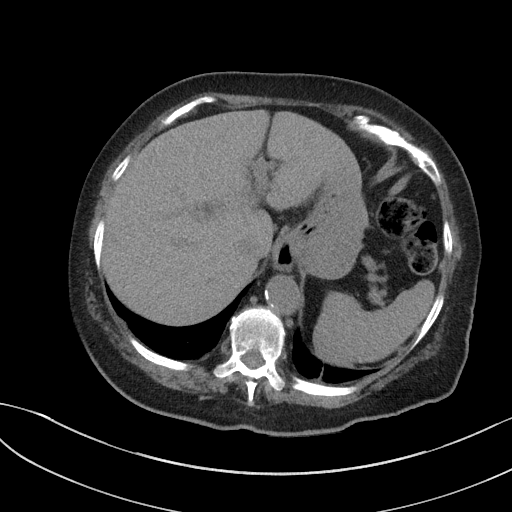
[im 90/103  soft-tissue]
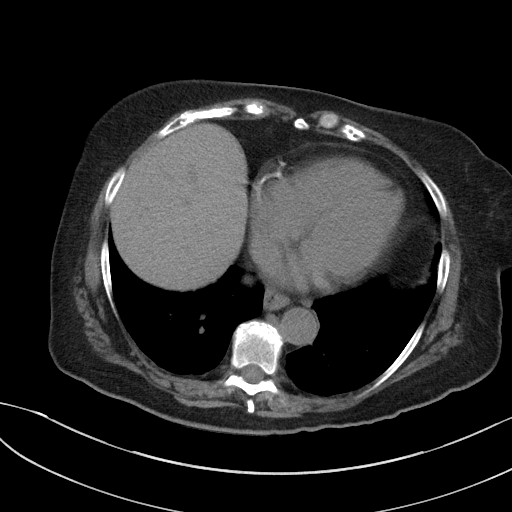
[im 98/103  soft-tissue]
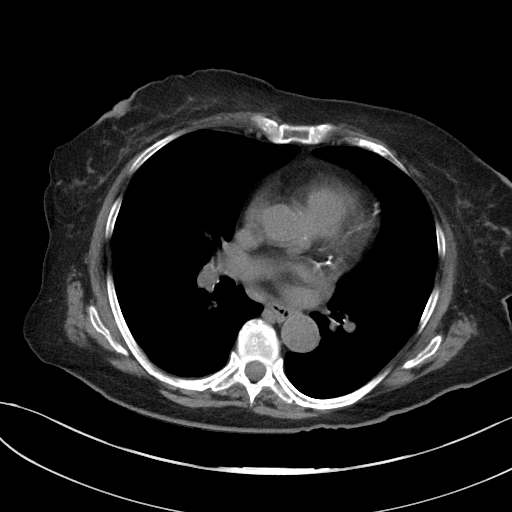

[Series 5: a/p w/o cor · coronal · non-contrast · 0.95mm/px · 3 of 159 slices shown]
[im 53/159  soft-tissue]
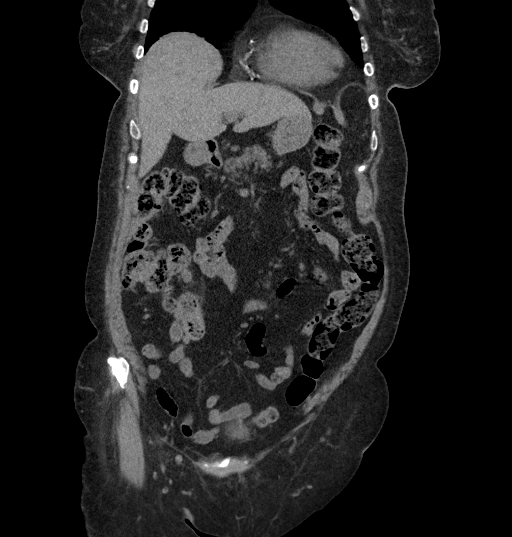
[im 71/159  soft-tissue]
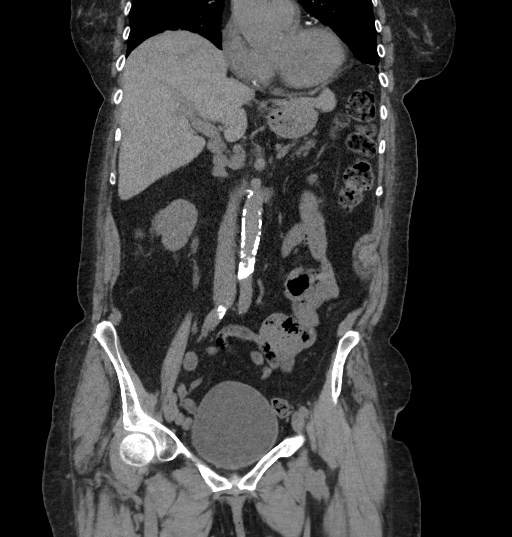
[im 88/159  soft-tissue]
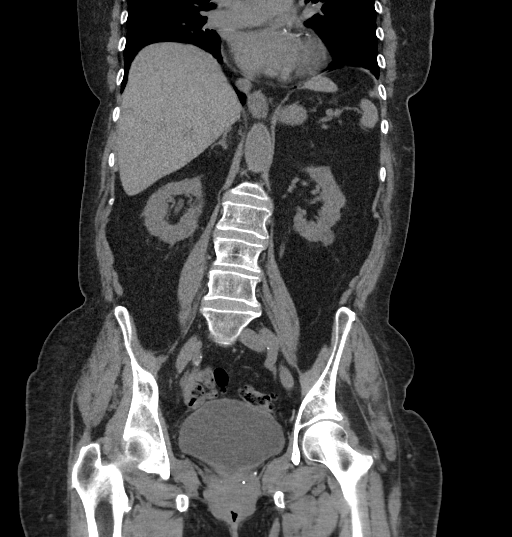

[16 of 46 positions shown; findings below may reference images not displayed]

FINDINGS: LOWER CHEST: Dependent atelectasis. The visualized heart size is
normal. Moderate coronary artery calcifications. No pericardial
effusion.

HEPATOBILIARY: Liver is normal.  Status post cholecystectomy.

PANCREAS: Normal.

SPLEEN: Normal.

ADRENALS/URINARY TRACT: Kidneys are orthotopic, demonstrating normal
size and morphology. No nephrolithiasis, hydronephrosis; limited
assessment for renal masses on this nonenhanced examination.
Exophytic 12 mm RIGHT lower pole and 15 mm LEFT lower pole renal
cysts. The unopacified ureters are normal in course and caliber.
Urinary bladder is well distended and unremarkable. Normal adrenal
glands.

STOMACH/BOWEL: Small hiatal hernia. The stomach, small and large
bowel are normal in course and caliber without inflammatory changes,
sensitivity decreased by lack of enteric contrast. Moderate amount
of retained large bowel stool. Diffuse colonic diverticulosis,
severe within sigmoid colon.

VASCULAR/LYMPHATIC: Aortoiliac vessels are normal in course and
caliber, moderate calcific atherosclerosis. No lymphadenopathy by CT
size criteria.

REPRODUCTIVE: Normal.

OTHER: No intraperitoneal free fluid or free air.

MUSCULOSKELETAL: Non-acute. Small fat containing umbilical hernia.
Mild old T1 compression fracture. Minimal grade 1 L4-5
anterolisthesis on degenerative basis. Moderate to severe lower
lumbar facet arthropathy.
IMPRESSION: No acute intra-abdominal process or evidence of trauma by
noncontrast CT.

Moderate atherosclerosis.

## 2016-05-27 MED ORDER — CEPHALEXIN 500 MG PO CAPS: 500.0000 mg | ORAL_CAPSULE | Freq: Four times a day (QID) | ORAL | 0 refills | Status: DC

## 2016-05-27 MED ORDER — CEPHALEXIN 250 MG PO CAPS
500.0000 mg | ORAL_CAPSULE | Freq: Once | ORAL | Status: AC
  Administered 2016-05-27: 500 mg via ORAL
  Filled 2016-05-27: qty 2

## 2016-05-27 NOTE — Discharge Instructions (Signed)
We saw you in the ER after you had a fall. All the imaging results are normal, no fractures seen. No evidence of brain bleed. Please be very careful with walking, and do everything possible to prevent falls.  The urine shows signs of infection. Start the antibiotics. See your primary doctor in 1 week.

## 2016-05-27 NOTE — ED Notes (Signed)
Patient left at this time with all belongings. 

## 2016-05-27 NOTE — ED Notes (Signed)
MD at bedside. 

## 2016-05-27 NOTE — ED Notes (Signed)
Lab called with trop 0.08

## 2016-05-28 ENCOUNTER — Other Ambulatory Visit: Payer: Self-pay | Admitting: Internal Medicine

## 2016-05-28 ENCOUNTER — Encounter: Payer: Self-pay | Admitting: Internal Medicine

## 2016-05-28 DIAGNOSIS — L309 Dermatitis, unspecified: Secondary | ICD-10-CM

## 2016-05-28 MED ORDER — FLUOCINONIDE-E 0.05 % EX CREA
1.0000 | TOPICAL_CREAM | Freq: Two times a day (BID) | CUTANEOUS | 2 refills | Status: DC
Start: 2016-05-28 — End: 2018-11-17

## 2016-05-29 ENCOUNTER — Emergency Department (HOSPITAL_COMMUNITY)
Admission: EM | Admit: 2016-05-29 | Discharge: 2016-05-29 | Disposition: A | Discharge status: Home / Self Care | Payer: Medicare Other | Attending: Dermatology | Admitting: Dermatology

## 2016-05-29 ENCOUNTER — Encounter (HOSPITAL_COMMUNITY): Payer: Self-pay | Admitting: Nurse Practitioner

## 2016-05-29 DIAGNOSIS — Y929 Unspecified place or not applicable: Secondary | ICD-10-CM | POA: Insufficient documentation

## 2016-05-29 DIAGNOSIS — W19XXXA Unspecified fall, initial encounter: Secondary | ICD-10-CM | POA: Insufficient documentation

## 2016-05-29 DIAGNOSIS — M546 Pain in thoracic spine: Secondary | ICD-10-CM | POA: Insufficient documentation

## 2016-05-29 DIAGNOSIS — Z7982 Long term (current) use of aspirin: Secondary | ICD-10-CM | POA: Insufficient documentation

## 2016-05-29 DIAGNOSIS — M79651 Pain in right thigh: Secondary | ICD-10-CM | POA: Insufficient documentation

## 2016-05-29 DIAGNOSIS — I2510 Atherosclerotic heart disease of native coronary artery without angina pectoris: Secondary | ICD-10-CM | POA: Diagnosis not present

## 2016-05-29 DIAGNOSIS — Z5321 Procedure and treatment not carried out due to patient leaving prior to being seen by health care provider: Secondary | ICD-10-CM | POA: Insufficient documentation

## 2016-05-29 DIAGNOSIS — J45909 Unspecified asthma, uncomplicated: Secondary | ICD-10-CM | POA: Diagnosis not present

## 2016-05-29 DIAGNOSIS — E119 Type 2 diabetes mellitus without complications: Secondary | ICD-10-CM | POA: Diagnosis not present

## 2016-05-29 DIAGNOSIS — Y939 Activity, unspecified: Secondary | ICD-10-CM | POA: Insufficient documentation

## 2016-05-29 DIAGNOSIS — Y999 Unspecified external cause status: Secondary | ICD-10-CM | POA: Insufficient documentation

## 2016-05-29 NOTE — ED Triage Notes (Addendum)
Pt presents with c/o pain.The pain began after a fall on 1/3. She was seen here in the ER for the fall that day and had CT scans to evaluate her injuries. She continues to have mid back and R buttock/thigh pain since the fall that has gotten worse. She has not tried anything at home for the pain because she does not like to take pain medication. She is concerned that her pain is getting worse. She has a follow up with PCP on Monday for this

## 2016-05-29 NOTE — ED Notes (Signed)
Pt stated that she had to leave because she had a very important matter come up that she needed to go to. Pt has left the emergency room at this time.

## 2016-05-31 ENCOUNTER — Other Ambulatory Visit: Payer: Self-pay | Admitting: Internal Medicine

## 2016-05-31 DIAGNOSIS — K589 Irritable bowel syndrome without diarrhea: Secondary | ICD-10-CM

## 2016-06-01 ENCOUNTER — Ambulatory Visit (INDEPENDENT_AMBULATORY_CARE_PROVIDER_SITE_OTHER)
Admission: RE | Admit: 2016-06-01 | Discharge: 2016-06-01 | Disposition: A | Discharge status: Home / Self Care | Payer: Medicare Other | Source: Ambulatory Visit | Attending: Internal Medicine | Admitting: Internal Medicine

## 2016-06-01 ENCOUNTER — Ambulatory Visit (INDEPENDENT_AMBULATORY_CARE_PROVIDER_SITE_OTHER): Payer: Medicare Other | Admitting: Internal Medicine

## 2016-06-01 ENCOUNTER — Encounter: Payer: Self-pay | Admitting: Internal Medicine

## 2016-06-01 VITALS — BP 160/78 | HR 106 | Temp 97.7°F | Resp 16 | Ht 64.0 in | Wt 171.0 lb

## 2016-06-01 DIAGNOSIS — R0789 Other chest pain: Secondary | ICD-10-CM

## 2016-06-01 DIAGNOSIS — S3991XA Unspecified injury of abdomen, initial encounter: Secondary | ICD-10-CM | POA: Diagnosis not present

## 2016-06-01 DIAGNOSIS — R296 Repeated falls: Secondary | ICD-10-CM | POA: Diagnosis not present

## 2016-06-01 IMAGING — DX DG ABDOMEN ACUTE W/ 1V CHEST
3 series · 3 of 3 positions shown · non-contrast
Comparison: 05/15/2013

CLINICAL DATA: Recent fall, pain with inhalation, RIGHT flank pain,
history asthma, diabetes mellitus, hypertension, bronchitis

EXAM:
DG ABDOMEN ACUTE W/ 1V CHEST

[chest pa]
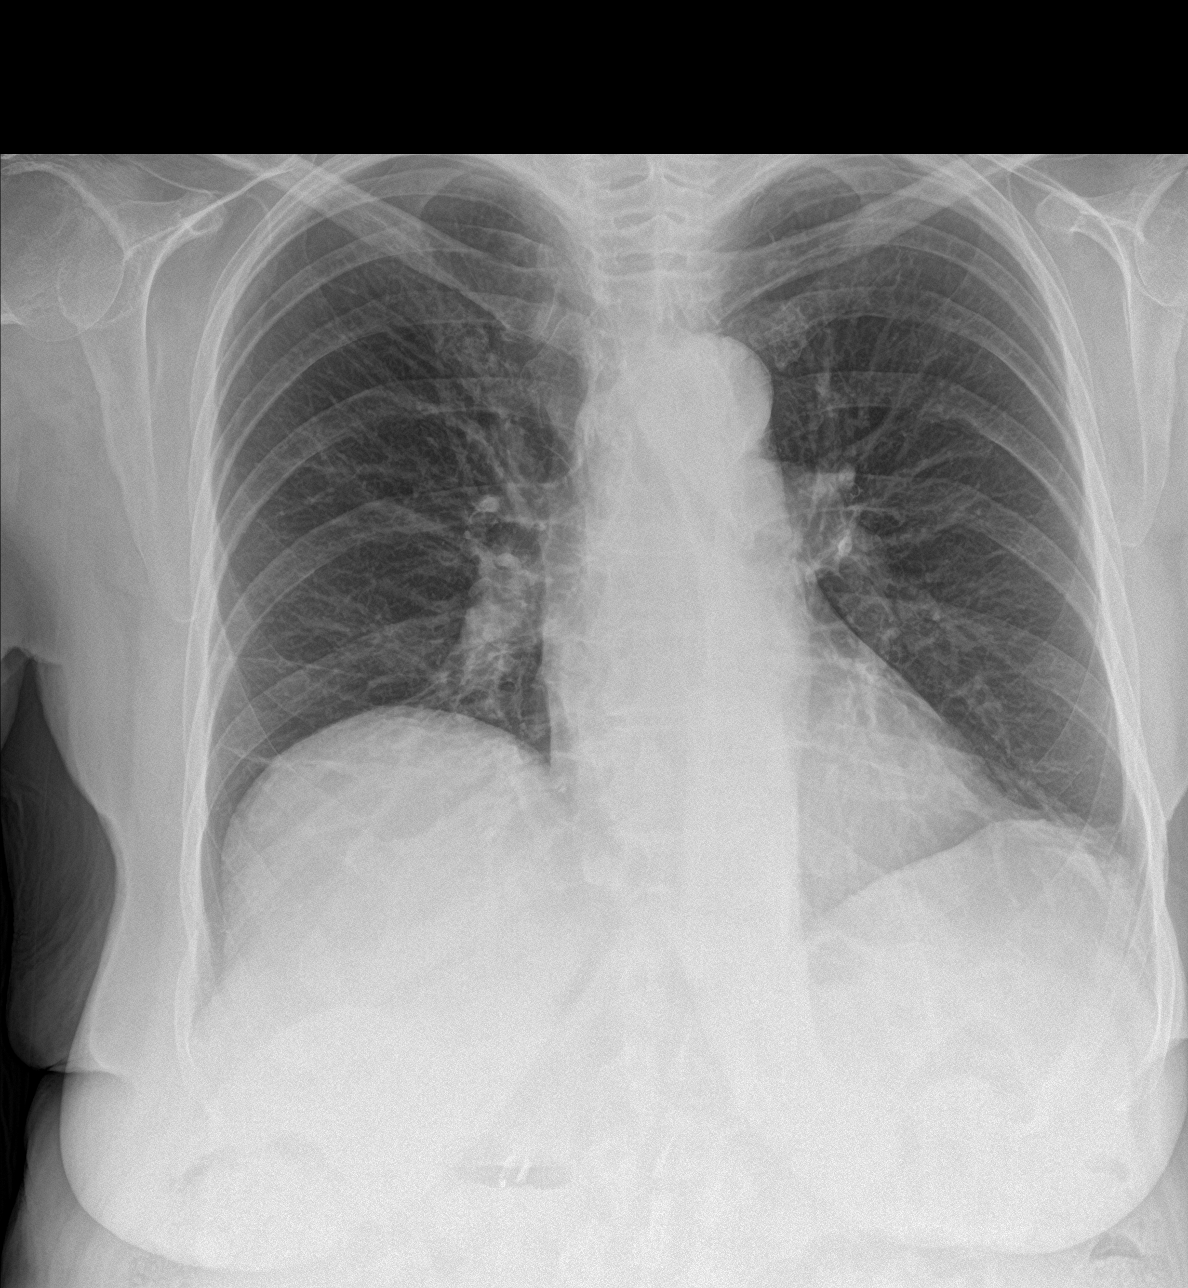

[abdomen erect]
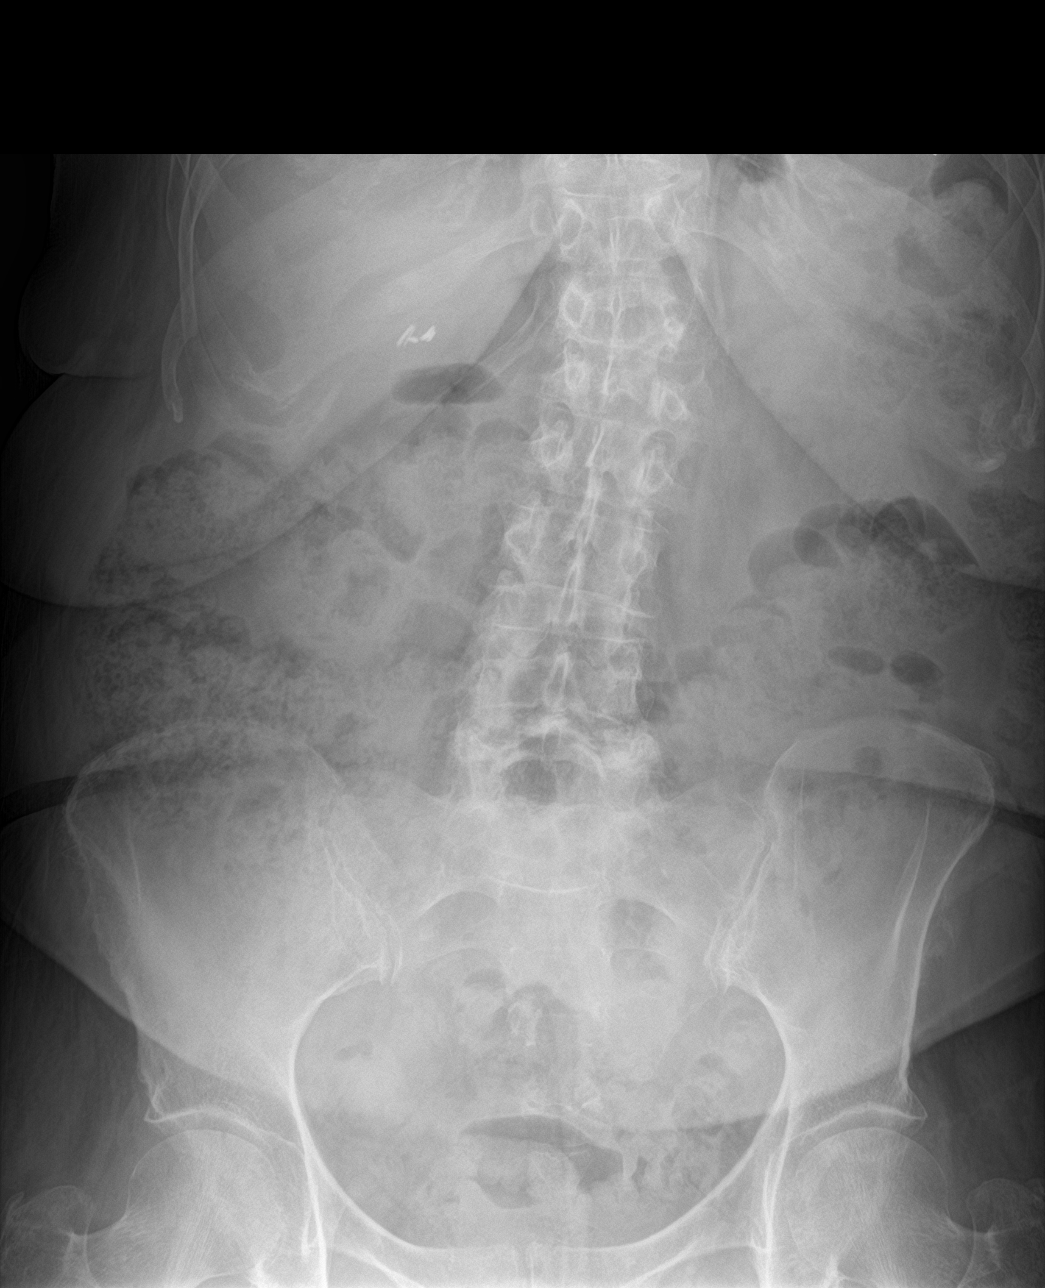

[abdomen supine]
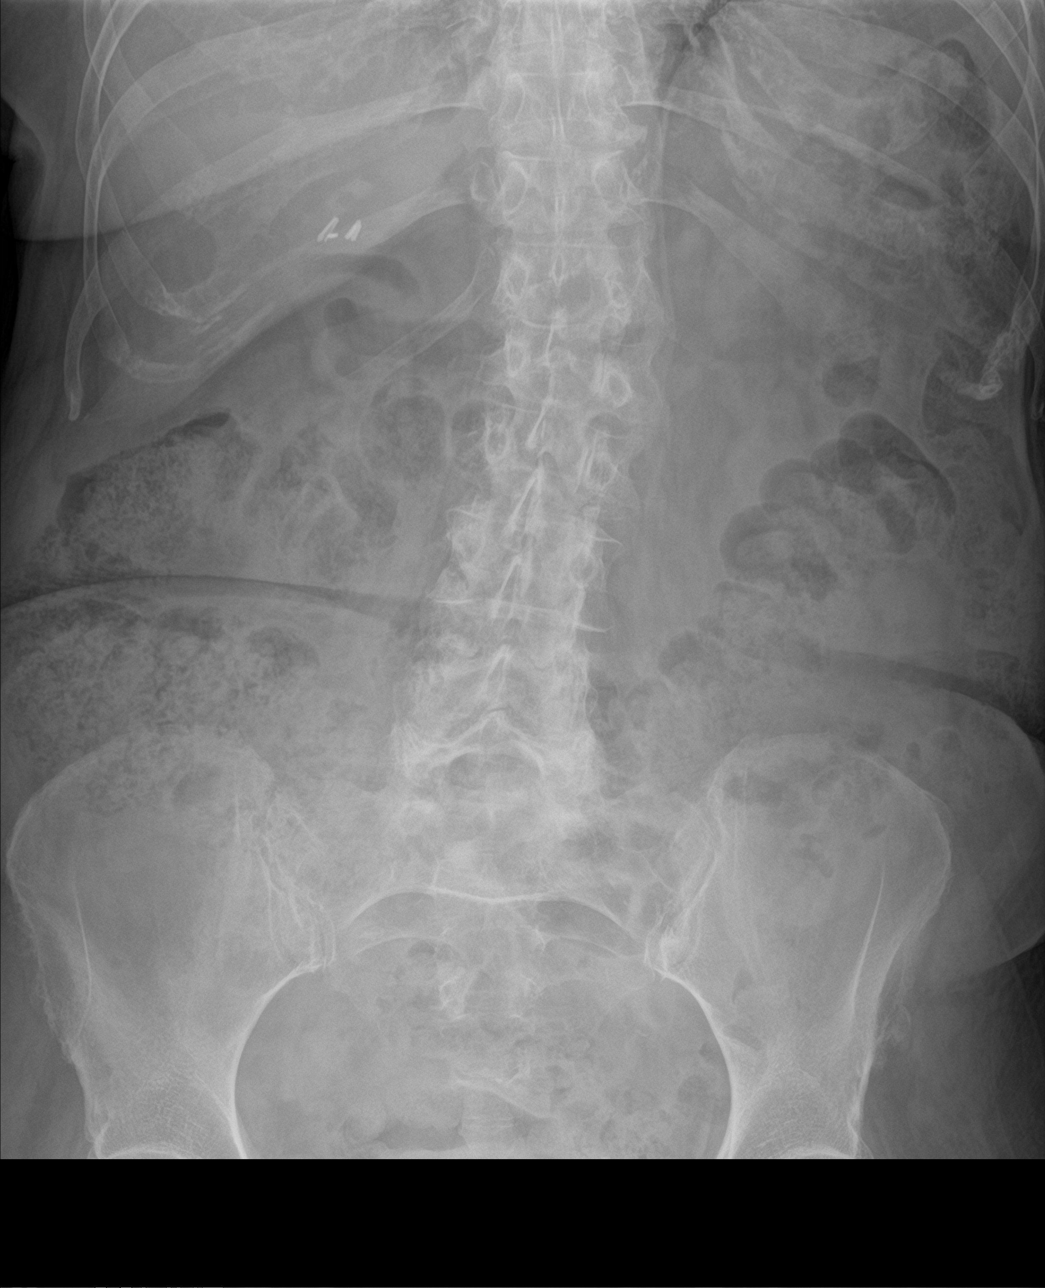

[3 of 3 positions shown; findings below may reference images not displayed]

FINDINGS: Normal heart size, mediastinal contours and pulmonary vascularity.

Atherosclerotic calcification aorta.

Bibasilar atelectasis.

Eventration RIGHT diaphragm stable.

No acute infiltrate, pleural effusion or pneumothorax.

Bones demineralized.

Increased stool in colon vertically RIGHT colon.

No bowel dilatation, bowel wall thickening, or free intraperitoneal
air.

Surgical clips RIGHT upper quadrant likely reflect cholecystectomy.

No urinary tract calcification.

Osseous demineralization with levoconvex thoracolumbar scoliosis.
IMPRESSION: Bibasilar atelectasis.

Increased stool in colon.

No additional acute abdominal findings.

## 2016-06-01 NOTE — Patient Instructions (Signed)
Chest Wall Pain °Introduction °Chest wall pain is pain in or around the bones and muscles of your chest. Sometimes, an injury causes this pain. Sometimes, the cause may not be known. This pain may take several weeks or longer to get better. °Follow these instructions at home: °Pay attention to any changes in your symptoms. Take these actions to help with your pain: °· Rest as told by your doctor. °· Avoid activities that cause pain. Try not to use your chest, belly (abdominal), or side muscles to lift heavy things. °· If directed, apply ice to the painful area: °¨ Put ice in a plastic bag. °¨ Place a towel between your skin and the bag. °¨ Leave the ice on for 20 minutes, 2-3 times per day. °· Take over-the-counter and prescription medicines only as told by your doctor. °· Do not use tobacco products, including cigarettes, chewing tobacco, and e-cigarettes. If you need help quitting, ask your doctor. °· Keep all follow-up visits as told by your doctor. This is important. °Contact a doctor if: °· You have a fever. °· Your chest pain gets worse. °· You have new symptoms. °Get help right away if: °· You feel sick to your stomach (nauseous) or you throw up (vomit). °· You feel sweaty or light-headed. °· You have a cough with phlegm (sputum) or you cough up blood. °· You are short of breath. °This information is not intended to replace advice given to you by your health care provider. Make sure you discuss any questions you have with your health care provider. °Document Released: 10/28/2007 Document Revised: 10/17/2015 Document Reviewed: 08/06/2014 °© 2017 Elsevier ° °

## 2016-06-01 NOTE — Progress Notes (Signed)
Subjective:  Patient ID: Nichole Silva, female    DOB: Jan 22, 1935  Age: 82 y.o. MRN: [id number]  CC: Chest Pain   HPI Devine Bobrowski presents for follow-up after a recent fall. She has some bruises around her low back and thigh but she tells me those aren't bothering her. Her only complaint today is some soreness over her right rib cage. She denies shortness of breath, cough, hemoptysis, nausea, vomiting, dysuria, or hematuria. She is getting relief from the pain with Tylenol.  Outpatient Medications Prior to Visit  Medication Sig Dispense Refill  . acetaminophen (TYLENOL) 325 MG tablet Take 325 mg by mouth every 6 (six) hours as needed for mild pain or moderate pain. Pain/headache    . ALPRAZolam (XANAX) 0.5 MG tablet TAKE 1/2 TO 1 TABLET BY MOUTH 3 TIMES A DAY AS NEEDED 90 tablet 1  . aspirin 81 MG tablet Take 81 mg by mouth daily.      Marland Kitchen atorvastatin (LIPITOR) 40 MG tablet TAKE 1 TABLET (40 MG TOTAL) BY MOUTH DAILY AT 6 PM. 90 tablet 3  . cetirizine (ZYRTEC) 10 MG tablet Take 10 mg by mouth daily.      Marland Kitchen dicyclomine (BENTYL) 10 MG capsule TAKE ONE CAPSULE BY MOUTH 3 TIMES A DAY BEFORE MEALS 90 capsule 7  . fluocinonide-emollient (LIDEX-E) 0.05 % cream Apply 1 application topically 2 (two) times daily. 60 g 2  . fluticasone (FLONASE) 50 MCG/ACT nasal spray USE 2 SPRAYS IN EACH NOSTRIL AT BEDTIME 16 g 11  . fluticasone furoate-vilanterol (BREO ELLIPTA) 200-25 MCG/INH AEPB Inhale 1 puff into the lungs daily. 30 each 11  . furosemide (LASIX) 40 MG tablet TAKE 1 TO 2 TABLETS BY MOUTH EVERY DAY 30 tablet 11  . losartan (COZAAR) 50 MG tablet TAKE 1 TABLET (50 MG TOTAL) BY MOUTH DAILY. OVERDUE FOR YEARLY PHYSICAL MUST SEE MD FOR REFILLS 90 tablet 0  . NIFEdipine (PROCARDIA XL/ADALAT-CC) 60 MG 24 hr tablet TAKE 1 TABLET (60 MG TOTAL) BY MOUTH DAILY. YEARLY PHYSICAL IS DUE MUST SEE MD FOR FUTURE REFILLS 90 tablet 0  . PROAIR HFA 108 (90 Base) MCG/ACT inhaler INHALE 1 TO 2 PUFFS BY MOUTH EVERY 4 HOURS  AS NEEDED FOR WHEEZE OR SHORTNESS OF BREATH 8.5 Inhaler 11  . Vitamin D, Ergocalciferol, (DRISDOL) 50000 UNITS CAPS capsule TAKE ONE CAPSULE BY MOUTH EVEY MONDAY 4 capsule 11  . cephALEXin (KEFLEX) 500 MG capsule Take 1 capsule (500 mg total) by mouth 4 (four) times daily. 14 capsule 0   No facility-administered medications prior to visit.     ROS Review of Systems  Constitutional: Negative.  Negative for activity change, appetite change, diaphoresis, fatigue and unexpected weight change.  HENT: Negative.   Eyes: Negative for visual disturbance.  Respiratory: Negative for cough, chest tightness and wheezing.   Cardiovascular: Positive for chest pain. Negative for palpitations and leg swelling.  Gastrointestinal: Negative for abdominal pain, blood in stool, constipation, diarrhea, nausea and vomiting.  Endocrine: Negative.   Genitourinary: Negative.   Musculoskeletal: Negative for back pain, gait problem, joint swelling, myalgias and neck pain.  Skin: Negative.   Allergic/Immunologic: Negative.   Neurological: Negative.  Negative for dizziness, weakness, numbness and headaches.  Hematological: Negative for adenopathy. Does not bruise/bleed easily.  Psychiatric/Behavioral: Negative.     Objective:  BP (!) 160/78 (BP Location: Left Arm, Patient Position: Sitting, Cuff Size: Normal)   Pulse (!) 106   Temp 97.7 F (36.5 C) (Oral)   Resp 16  Ht 5\' 4"  (1.626 m)   Wt 171 lb (77.6 kg)   SpO2 96%   BMI 29.35 kg/m   BP Readings from Last 3 Encounters:  06/01/16 (!) 160/78  05/29/16 133/64  05/27/16 129/69    Wt Readings from Last 3 Encounters:  06/01/16 171 lb (77.6 kg)  05/26/16 173 lb (78.5 kg)  01/30/16 173 lb (78.5 kg)    Physical Exam  Constitutional: She is oriented to person, place, and time. No distress.  HENT:  Mouth/Throat: Oropharynx is clear and moist. No oropharyngeal exudate.  Eyes: Conjunctivae are normal. Right eye exhibits no discharge. Left eye exhibits no  discharge. No scleral icterus.  Neck: Normal range of motion. Neck supple. No JVD present. No tracheal deviation present. No thyromegaly present.  Cardiovascular: Normal rate, regular rhythm, normal heart sounds and intact distal pulses.  Exam reveals no gallop and no friction rub.   No murmur heard. Pulmonary/Chest: Effort normal and breath sounds normal. No accessory muscle usage or stridor. No respiratory distress. She has no decreased breath sounds. She has no wheezes. She has no rhonchi. She has no rales. She exhibits no mass, no tenderness, no bony tenderness, no edema, no deformity and no swelling.  Abdominal: Soft. Bowel sounds are normal. She exhibits no distension and no mass. There is no tenderness. There is no rebound and no guarding.  Musculoskeletal: Normal range of motion. She exhibits no edema, tenderness or deformity.  Lymphadenopathy:    She has no cervical adenopathy.  Neurological: She is alert and oriented to person, place, and time. She has normal reflexes. No cranial nerve deficit. She exhibits normal muscle tone. Coordination normal.  Skin: Skin is warm and dry. No rash noted. She is not diaphoretic. No erythema. No pallor.  Vitals reviewed.   Lab Results  Component Value Date   WBC 9.1 01/29/2015   HGB 14.0 01/29/2015   HCT 41.9 01/29/2015   PLT 295.0 01/29/2015   GLUCOSE 126 (H) 01/30/2016   CHOL 183 07/29/2015   TRIG 202.0 (H) 07/29/2015   HDL 47.30 07/29/2015   LDLDIRECT 102.0 07/29/2015   LDLCALC 82 09/14/2013   ALT 15 06/12/2013   AST 18 06/12/2013   NA 139 01/30/2016   K 4.3 01/30/2016   CL 106 01/30/2016   CREATININE 0.86 01/30/2016   BUN 11 01/30/2016   CO2 30 01/30/2016   TSH 3.68 09/14/2013   HGBA1C 6.0 01/30/2016   MICROALBUR 0.4 01/30/2016    No results found.  Assessment & Plan:   Layliana was seen today for chest pain.  Diagnoses and all orders for this visit:  Frequent falls -     Consult to [place] [facility]  Chest wall pain-  her plain film is revealing only for constipation. There is no injury in the rib cage, the chest wall, or the lungs. Will treat this as a contusion and she will continue to take Tylenol as needed for the pain. -     DG Abd Acute W/Chest; Future   I have discontinued Ms. Yohman cephALEXin. I am also having her maintain her aspirin, cetirizine, acetaminophen, furosemide, Vitamin D (Ergocalciferol), PROAIR HFA, fluticasone furoate-vilanterol, atorvastatin, ALPRAZolam, losartan, NIFEdipine, fluocinonide-emollient, dicyclomine, and fluticasone.  No orders of the defined types were placed in this encounter.    Follow-up: Return if symptoms worsen or fail to improve.  Scarlette Calico, MD

## 2016-06-01 NOTE — Progress Notes (Signed)
Pre visit review using our clinic review tool, if applicable. No additional management support is needed unless otherwise documented below in the visit note. 

## 2016-06-03 ENCOUNTER — Other Ambulatory Visit: Payer: Self-pay | Admitting: *Deleted

## 2016-06-03 NOTE — Patient Outreach (Addendum)
[name]) Care Management  06/03/2016  Nichole Silva [birth year] [id number]   RN spoke with pt's consent daughter Elvera Lennox) concerning pt's possible needs and the source of the referral to the [facility] office (MD referral). RN introduced to the [facility] program, services and possible resources that maybe needed for this pt. Daughter Elvera Lennox) states pt has a hearing impairment and she is the HCPOA.  Daughter concerning about the pt current condition related to falls and possible safety issues in the home. Reports no major injuries however history of several bruises. Reports there are several items (did not described these items in detail) that maybe on the floor in the home and everyone in the home is aware but some of these items can be removed if needed.  States pt has her spouse and one of her sibling currently living in the home and is a "recovering addict". States pt needs a walker but not sure how to obtain this piece of equipment. RN strongly encouraged daughter to contact pt's provider and make the request for pt to be evaluated in order to obtain a prescription for a [place] agency to provide this DME for a rolling walker or rollator. Daughter aware it may involve a co-pay and need to inquire with the agency chosen for the equipment. Caregiver with understanding and will contact the pt's primary provider with this request. Daughter reports the pt was very busy taking care of her husband who is ill and on home oxygen and is probably in need of some assistance. States pt is very weak and not taking care of herself to the fullest exist. RN discussed level of care if she felt the pt needs a more controlled environment and 24/7 care from staffing on her medications and ongoing care. Daughter grateful for the information provided however this is something she feels she needs to discuss with the pt in the future but does not feel pt is ready for placement. RN further inquired on pt's  diabetes and daughter replies pt gets pedicures for her diabetic foot care and RN able to see via EPIC pt's latest A1C at 6.6 on 01/30/2016. Daughter states pt has her diabetes uncontrol with no issues to address at this time. RN offered needs for community resources and extended an offer for a home visit for further involvement however daughter requested to discuss this with the pt and to call back next week. RN offered to schedule a call back next Monday and possibly inquire further on pt's health to completed more health information on the pt (daughter agreed). Will follow up on Monday with a scheduled telephone call.   Raina Mina, RN Care Management Coordinator [place] Office [phone]

## 2016-06-08 ENCOUNTER — Encounter: Payer: Self-pay | Admitting: *Deleted

## 2016-06-08 ENCOUNTER — Other Ambulatory Visit: Payer: Self-pay | Admitting: *Deleted

## 2016-06-08 NOTE — Patient Outreach (Signed)
[place] [facility]) Care Management  06/08/2016  Season Goldtooth 11/01/1934 [id number]  RN attempted outreach call however unsuccessful today. Unable to leave a HIPAA approved voice message to this number will continues to contact family for [facility] services for this pt. Will reschedule follow up phone for possible [facility] services.  Raina Mina, RN Care Management Coordinator [place] Office [phone]

## 2016-06-08 NOTE — Telephone Encounter (Signed)
This encounter was created in error - please disregard.  This encounter was created in error - please disregard.

## 2016-06-08 NOTE — Patient Outreach (Signed)
[place] [facility]) Care Management  06/08/2016  Nichole Silva May 10, 1935 [id number]   RN received a call from pt today concerning [facility] services. Pt hearing impaired however has a Sports administrator for telephonic conversations. Pt reports she takes care of her spouse who is currently on home oxygen goes to rehabilitation on several days during the week. Pt seeks assistance from her sister for transportation due to hearing impairment and inability to heard while driving. RN discussed [facility] services and the benefits of such services related to her possible needs. Pt indicated her diabetes is under controlled and further explained care she is providing for her daughter after heart surgery and her spouse once again. RN explained possible services that can be initiated but pt feels at this time she does not need such services. RN offered possible telephone disease management however this was also declined. RN offered to provider some community resources via [facility] calendar as pt receptive and appreciative for the information provided. No other request or inquires at this time. Pt informed RN will notify her provider of her disposition with [facility] (decline of services) and again send her a [facility] calendar for available resources. Pt also has RN contact number for any future inquires or needed resources. This case will be closed.  Raina Mina, RN Care Management Coordinator [place] Office [phone]

## 2016-06-15 ENCOUNTER — Other Ambulatory Visit: Payer: Self-pay | Admitting: Internal Medicine

## 2016-06-15 NOTE — Telephone Encounter (Signed)
rx faxed to [facility].  

## 2016-07-02 ENCOUNTER — Other Ambulatory Visit: Payer: Self-pay | Admitting: Internal Medicine

## 2016-07-02 DIAGNOSIS — IMO0002 Reserved for concepts with insufficient information to code with codable children: Secondary | ICD-10-CM

## 2016-07-02 DIAGNOSIS — E1165 Type 2 diabetes mellitus with hyperglycemia: Secondary | ICD-10-CM

## 2016-07-02 DIAGNOSIS — I10 Essential (primary) hypertension: Secondary | ICD-10-CM

## 2016-07-31 ENCOUNTER — Other Ambulatory Visit: Payer: Self-pay | Admitting: Internal Medicine

## 2016-07-31 DIAGNOSIS — J4540 Moderate persistent asthma, uncomplicated: Secondary | ICD-10-CM

## 2016-08-04 ENCOUNTER — Telehealth: Payer: Self-pay | Admitting: *Deleted

## 2016-08-04 DIAGNOSIS — J4540 Moderate persistent asthma, uncomplicated: Secondary | ICD-10-CM

## 2016-08-04 MED ORDER — FLUTICASONE FUROATE-VILANTEROL 200-25 MCG/INH IN AEPB: 1.0000 | INHALATION_SPRAY | Freq: Every day | RESPIRATORY_TRACT | 1 refills | Status: DC

## 2016-08-04 NOTE — Telephone Encounter (Signed)
Rec'd call from [facility] needing to verify instructions and quantity for Breo Elliptal inhaler that was escribe on 07/31/16. Quantity was written for 90 each ? If MD want 90 day supply. Resent rx electronically...lmb

## 2016-08-07 ENCOUNTER — Encounter: Payer: Self-pay | Admitting: Internal Medicine

## 2016-08-11 ENCOUNTER — Ambulatory Visit (INDEPENDENT_AMBULATORY_CARE_PROVIDER_SITE_OTHER)
Admission: RE | Admit: 2016-08-11 | Discharge: 2016-08-11 | Disposition: A | Discharge status: Home / Self Care | Payer: Medicare Other | Source: Ambulatory Visit | Attending: Internal Medicine | Admitting: Internal Medicine

## 2016-08-11 ENCOUNTER — Ambulatory Visit (INDEPENDENT_AMBULATORY_CARE_PROVIDER_SITE_OTHER): Payer: Medicare Other | Admitting: Internal Medicine

## 2016-08-11 VITALS — BP 122/62 | HR 88 | Temp 98.5°F | Resp 16 | Ht 64.0 in | Wt 165.0 lb

## 2016-08-11 DIAGNOSIS — S99912A Unspecified injury of left ankle, initial encounter: Secondary | ICD-10-CM

## 2016-08-11 DIAGNOSIS — S8262XA Displaced fracture of lateral malleolus of left fibula, initial encounter for closed fracture: Secondary | ICD-10-CM | POA: Diagnosis not present

## 2016-08-11 DIAGNOSIS — S82899A Other fracture of unspecified lower leg, initial encounter for closed fracture: Secondary | ICD-10-CM | POA: Diagnosis not present

## 2016-08-11 DIAGNOSIS — S99922A Unspecified injury of left foot, initial encounter: Secondary | ICD-10-CM | POA: Diagnosis not present

## 2016-08-11 DIAGNOSIS — S82891A Other fracture of right lower leg, initial encounter for closed fracture: Secondary | ICD-10-CM | POA: Insufficient documentation

## 2016-08-11 DIAGNOSIS — S92352A Displaced fracture of fifth metatarsal bone, left foot, initial encounter for closed fracture: Secondary | ICD-10-CM | POA: Diagnosis not present

## 2016-08-11 IMAGING — DX DG ANKLE COMPLETE 3+V*L*
3 series · 3 of 3 positions shown · non-contrast
Comparison: None.

CLINICAL DATA: Left ankle pain and swelling after fall 1 week ago.

EXAM:
LEFT ANKLE COMPLETE - 3+ VIEW

[ankle ap]
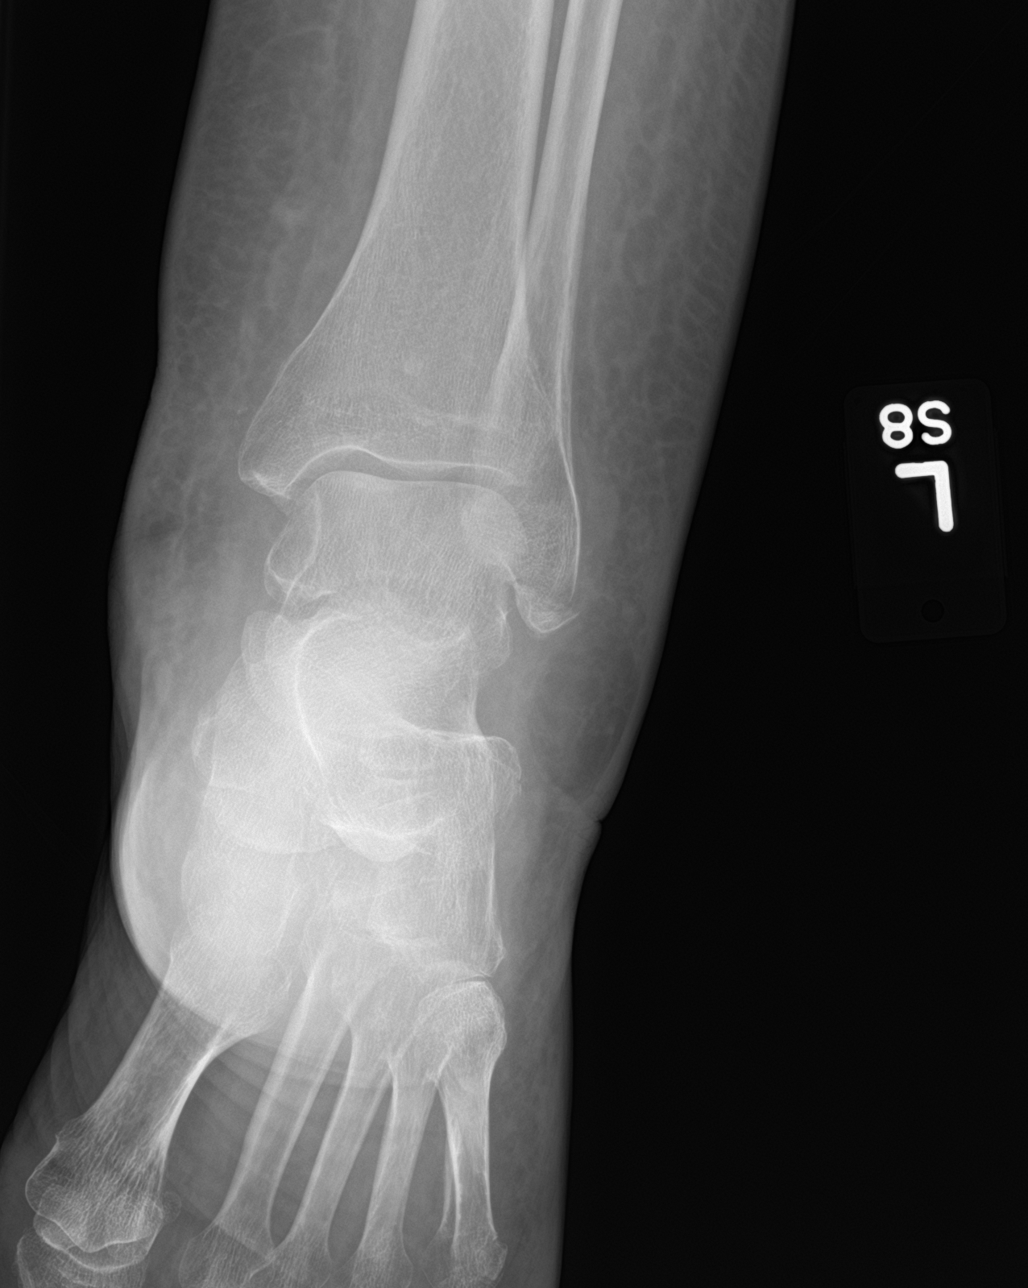

[ankle obl]
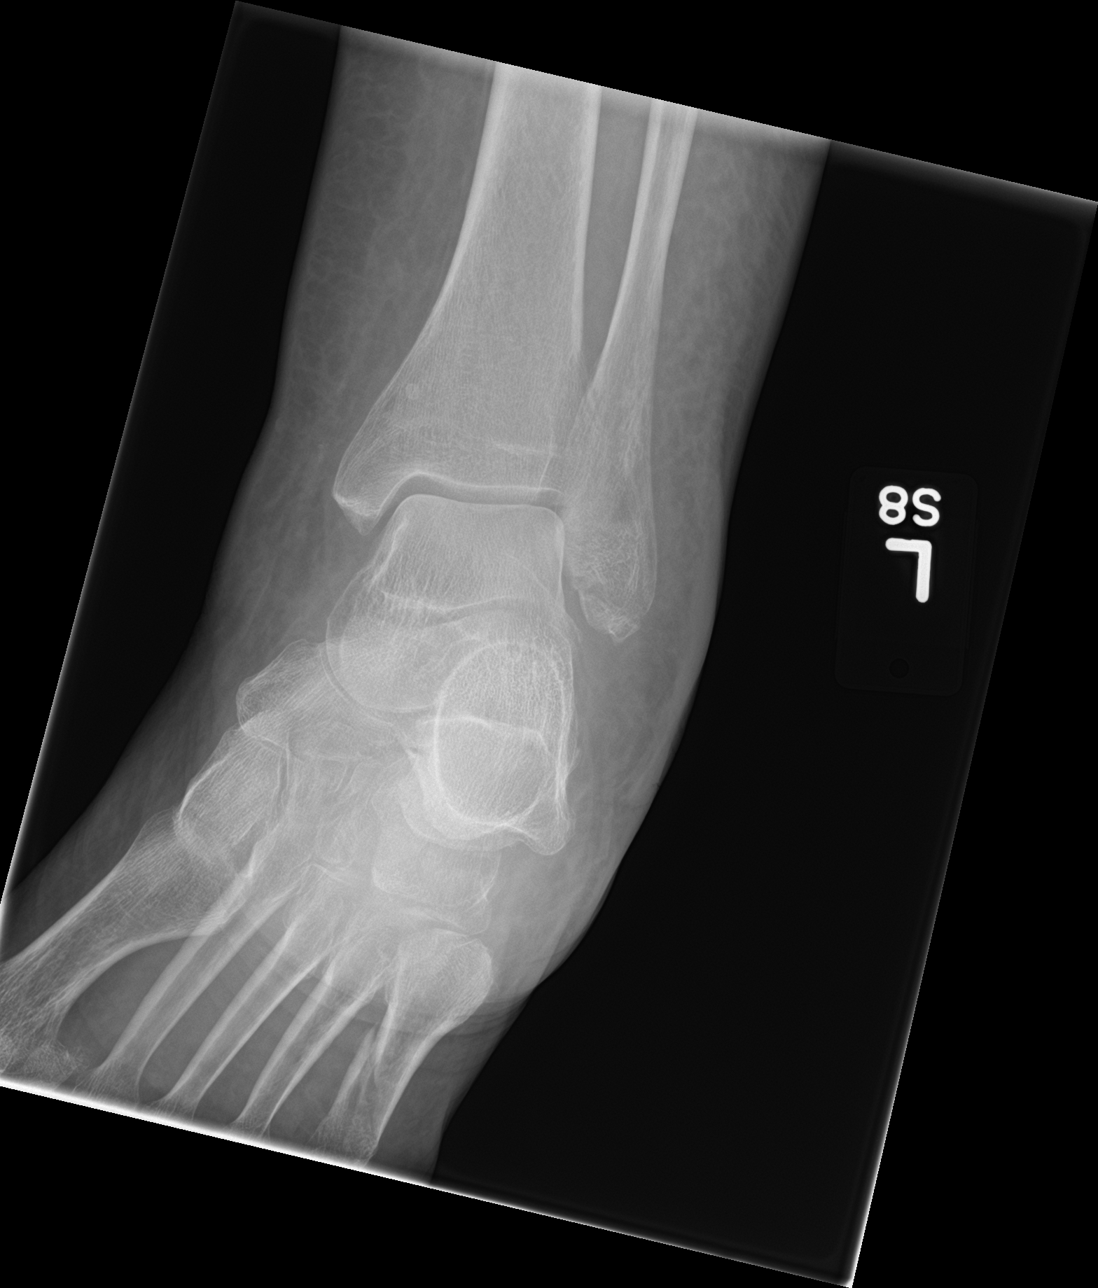

[ankle lat]
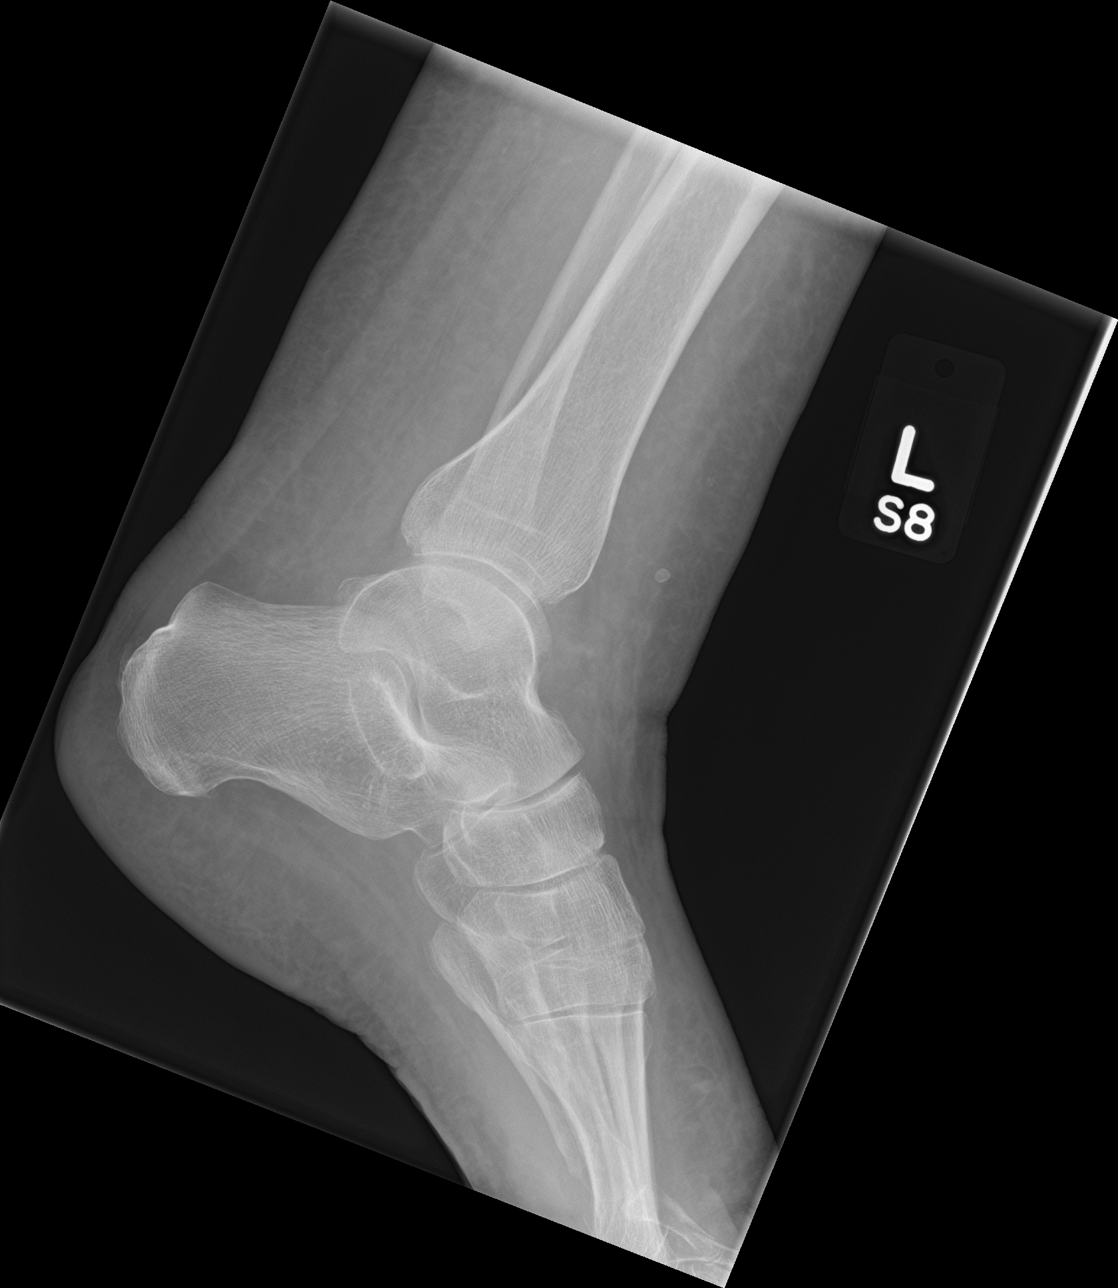

[3 of 3 positions shown; findings below may reference images not displayed]

FINDINGS: Mildly displaced fracture is seen involving the lateral malleolus.
Joint spaces are intact. No other fracture or bony abnormality is
noted.
IMPRESSION: Mildly displaced lateral malleolar fracture.

## 2016-08-11 IMAGING — DX DG FOOT COMPLETE 3+V*L*
3 series · 3 of 3 positions shown · non-contrast
Comparison: None.

CLINICAL DATA: Left foot pain and swelling after fall 1 week ago.

EXAM:
LEFT FOOT - COMPLETE 3+ VIEW

[foot ap]
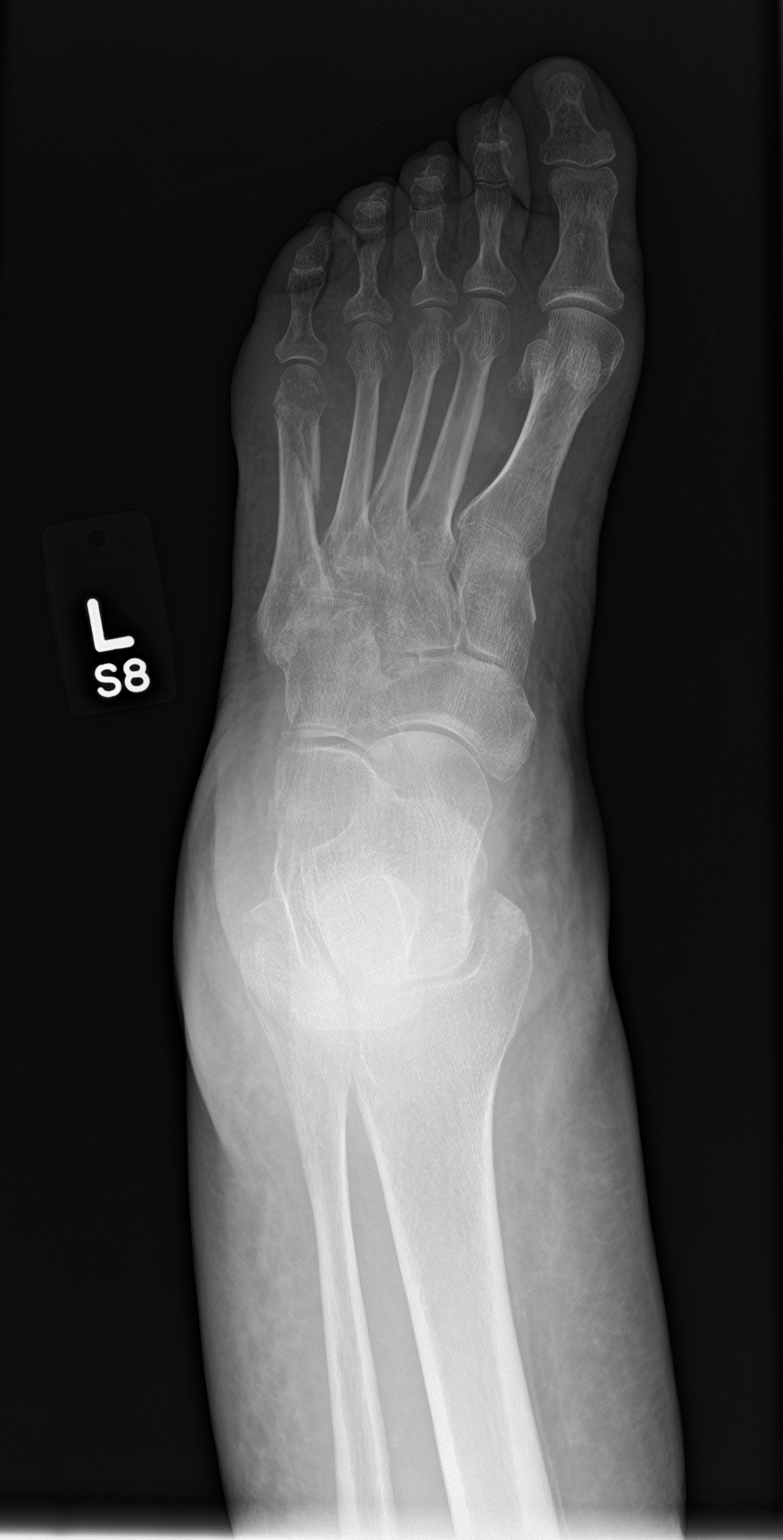

[foot obl]
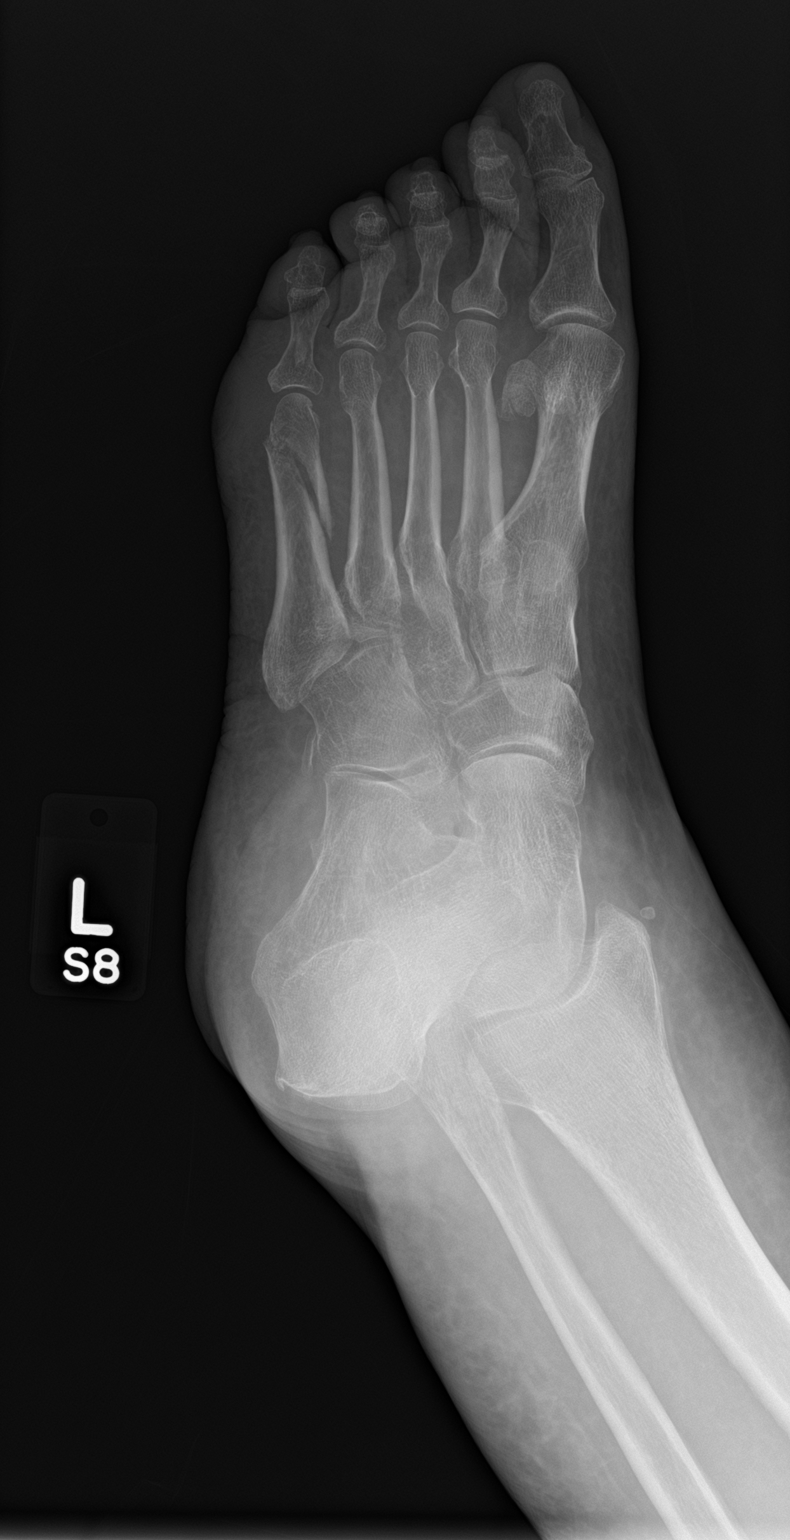

[foot lat]
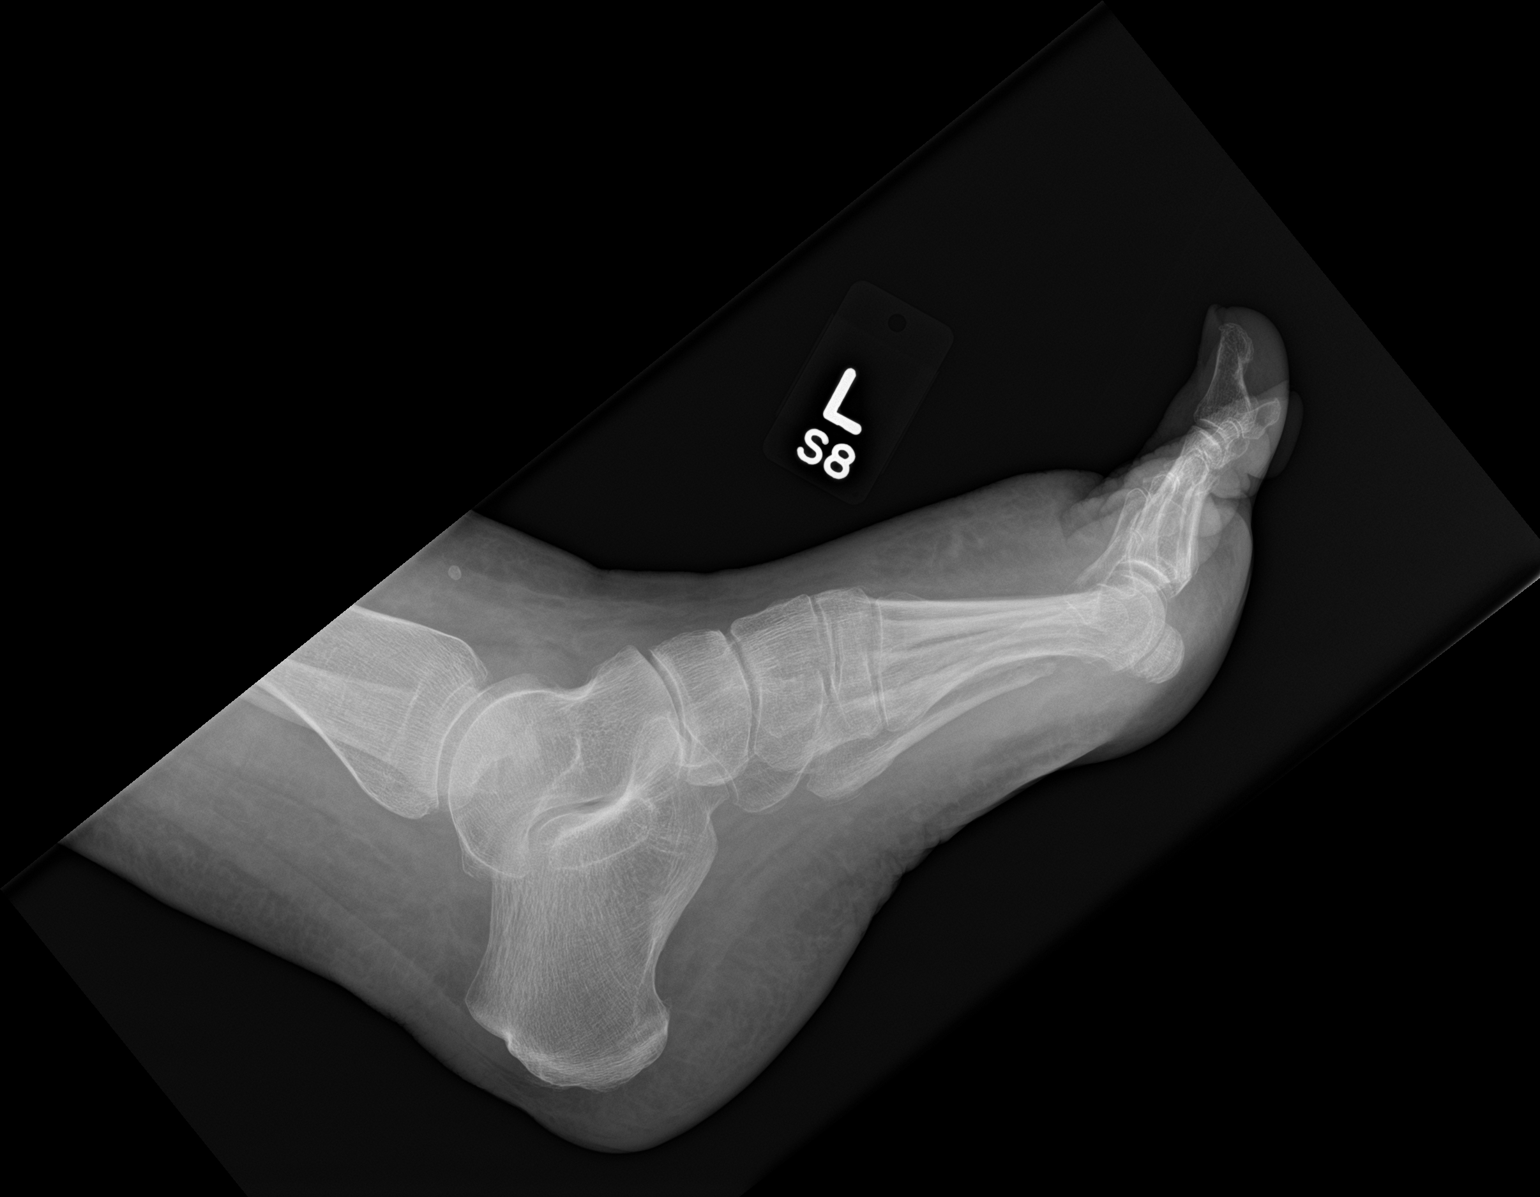

[3 of 3 positions shown; findings below may reference images not displayed]

FINDINGS: Mildly displaced oblique fracture is seen involving the distal fifth
metatarsal. Joint spaces are intact. No soft tissue abnormality is
noted.
IMPRESSION: Mildly displaced distal fifth metatarsal fracture.

## 2016-08-11 NOTE — Progress Notes (Signed)
Pre visit review using our clinic review tool, if applicable. No additional management support is needed unless otherwise documented below in the visit note. 

## 2016-08-11 NOTE — Patient Instructions (Signed)
Foot Sprain A foot sprain is an injury to one of the strong bands of tissue (ligaments) that connect and support the many bones in your feet. The ligament can be stretched too much or it can tear. A tear can be either partial or complete. The severity of the sprain depends on how much of the ligament was damaged or torn. What are the causes? A foot sprain is usually caused by suddenly twisting or pivoting your foot. What increases the risk? This injury is more likely to occur in people who:  Play a sport, such as basketball or football.  Exercise or play a sport without warming up.  Start a new workout or sport.  Suddenly increase how long or hard they exercise or play a sport. What are the signs or symptoms? Symptoms of this condition start soon after an injury and include:  Pain, especially in the arch of the foot.  Bruising.  Swelling.  Inability to walk or use the foot to support body weight. How is this diagnosed? This condition is diagnosed with a medical history and physical exam. You may also have imaging tests, such as:  X-rays to make sure there are no broken bones (fractures).  MRI to see if the ligament has torn. How is this treated? Treatment varies depending on the severity of your sprain. Mild sprains can be treated with rest, ice, compression, and elevation (RICE). If your ligament is overstretched or partially torn, treatment usually involves keeping your foot in a fixed position (immobilization) for a period of time. To help you do this, your health care provider will apply a bandage, splint, or walking boot to keep your foot from moving until it heals. You may also be advised to use crutches or a scooter for a few weeks to avoid bearing weight on your foot while it is healing. If your ligament is fully torn, you may need surgery to reconnect the ligament to the bone. After surgery, a cast or splint will be applied and will need to stay on your foot while it  heals. Your health care provider may also suggest exercises or physical therapy to strengthen your foot. Follow these instructions at home: If You Have a Bandage, Splint, or Walking Boot:   Wear it as directed by your health care provider. Remove it only as directed by your health care provider.  Loosen the bandage, splint, or walking boot if your toes become numb and tingle, or if they turn cold and blue. Bathing   If your health care provider approves bathing and showering, cover the bandage or splint with a watertight plastic bag to protect it from water. Do not let the bandage or splint get wet. Managing pain, stiffness, and swelling   If directed, apply ice to the injured area:  Put ice in a plastic bag.  Place a towel between your skin and the bag.  Leave the ice on for 20 minutes, 2-3 times per day.  Move your toes often to avoid stiffness and to lessen swelling.  Raise (elevate) the injured area above the level of your heart while you are sitting or lying down. Driving   Do not drive or operate heavy machinery while taking pain medicine.  Ask your health care provider when it is safe to drive if you have a bandage, splint, or walking boot on your foot. Activity   Rest as directed by your health care provider.  Do not use the injured foot to support your body weight until   your health care provider says that you can. Use crutches or other supportive devices as directed by your health care provider.  Ask your health care provider what activities are safe for you. Gradually increase how much and how far you walk until your health care provider says it is safe to return to full activity.  Do any exercise or physical therapy as directed by your health care provider. General instructions   If a splint was applied, do not put pressure on any part of it until it is fully hardened. This may take several hours.  Take medicines only as directed by your health care provider.  These include over-the-counter medicines and prescription medicines.  Keep all follow-up visits as directed by your health care provider. This is important.  When you can walk without pain, wear supportive shoes that have stiff soles. Do not wear flip-flops, and do not walk barefoot. Contact a health care provider if:  Your pain is not controlled with medicine.  Your bruising or swelling gets worse or does not get better with treatment.  Your splint or walking boot is damaged. Get help right away if:  You develop severe numbness or tingling in your foot.  Your foot turns blue, white, or gray, and it feels cold. This information is not intended to replace advice given to you by your health care provider. Make sure you discuss any questions you have with your health care provider. Document Released: 10/31/2001 Document Revised: 10/17/2015 Document Reviewed: 03/14/2014 Elsevier Interactive Patient Education  2017 [employer]  

## 2016-08-11 NOTE — Progress Notes (Signed)
Subjective:  Patient ID: Nichole Silva, female    DOB: [birth year]  Age: 82 y.o. MRN: [id number]  CC: Leg Injury   HPI Nichole Silva presents for complaints about left ankle and left foot pain 1 week after falling in her house. She is bearing weight on the left lower extremity but says there is pain and swelling. She denies headache, head injury, neck pain, back pain, chest pain, or pain elsewhere in her extremities.  Outpatient Medications Prior to Visit  Medication Sig Dispense Refill  . acetaminophen (TYLENOL) 325 MG tablet Take 325 mg by mouth every 6 (six) hours as needed for mild pain or moderate pain. Pain/headache    . ALPRAZolam (XANAX) 0.5 MG tablet TAKE 1/2 TO 1 TABLET BY MOUTH 3 TIMES A DAY AS NEEDED 90 tablet 5  . aspirin 81 MG tablet Take 81 mg by mouth daily.      Marland Kitchen atorvastatin (LIPITOR) 40 MG tablet TAKE 1 TABLET (40 MG TOTAL) BY MOUTH DAILY AT 6 PM. 90 tablet 3  . cetirizine (ZYRTEC) 10 MG tablet Take 10 mg by mouth daily.      Marland Kitchen dicyclomine (BENTYL) 10 MG capsule TAKE ONE CAPSULE BY MOUTH 3 TIMES A DAY BEFORE MEALS 90 capsule 7  . fluocinonide-emollient (LIDEX-E) 0.05 % cream Apply 1 application topically 2 (two) times daily. 60 g 2  . fluticasone (FLONASE) 50 MCG/ACT nasal spray USE 2 SPRAYS IN EACH NOSTRIL AT BEDTIME 16 g 11  . fluticasone furoate-vilanterol (BREO ELLIPTA) 200-25 MCG/INH AEPB Inhale 1 puff into the lungs daily. 60 each 1  . furosemide (LASIX) 40 MG tablet TAKE 1 TO 2 TABLETS BY MOUTH EVERY DAY 30 tablet 11  . losartan (COZAAR) 50 MG tablet Take 1 tablet (50 mg total) by mouth daily. 90 tablet 3  . NIFEdipine (PROCARDIA XL/ADALAT-CC) 60 MG 24 hr tablet Take 1 tablet (60 mg total) by mouth daily. 90 tablet 3  . PROAIR HFA 108 (90 Base) MCG/ACT inhaler INHALE 1 TO 2 PUFFS BY MOUTH EVERY 4 HOURS AS NEEDED FOR WHEEZE OR SHORTNESS OF BREATH 8.5 Inhaler 11  . Vitamin D, Ergocalciferol, (DRISDOL) 50000 units CAPS capsule TAKE ONE CAPSULE BY MOUTH EVERY MONDAY 12  capsule 3   No facility-administered medications prior to visit.     ROS Review of Systems  Constitutional: Negative for diaphoresis, fatigue and unexpected weight change.  HENT: Negative.   Eyes: Negative for visual disturbance.  Respiratory: Negative for cough, chest tightness, shortness of breath and wheezing.   Cardiovascular: Negative for chest pain, palpitations and leg swelling.  Gastrointestinal: Negative for abdominal pain, constipation, diarrhea, nausea and vomiting.  Genitourinary: Negative.   Musculoskeletal: Positive for arthralgias. Negative for myalgias and neck pain.  Skin: Negative.  Negative for color change and wound.  Allergic/Immunologic: Negative.   Neurological: Negative.  Negative for dizziness, weakness and headaches.  Hematological: Negative.  Negative for adenopathy. Does not bruise/bleed easily.  Psychiatric/Behavioral: Negative.     Objective:  BP 122/62 (BP Location: Right Arm, Patient Position: Sitting, Cuff Size: Large)   Pulse 88   Temp 98.5 F (36.9 C) (Oral)   Resp 16   Ht 5\' 4"  (6.712 m)   Wt 165 lb 0.6 oz (74.9 kg)   SpO2 95%   BMI 28.33 kg/m   BP Readings from Last 3 Encounters:  08/11/16 122/62  06/01/16 (!) 160/78  05/29/16 133/64    Wt Readings from Last 3 Encounters:  08/11/16 165 lb 0.6 oz (74.9 kg)  06/01/16 171 lb (77.6 kg)  05/26/16 173 lb (78.5 kg)    Physical Exam  Constitutional: She is oriented to person, place, and time. No distress.  HENT:  Head: Normocephalic and atraumatic.  Mouth/Throat: Oropharynx is clear and moist. No oropharyngeal exudate.  Eyes: Conjunctivae are normal. Right eye exhibits no discharge. Left eye exhibits no discharge. No scleral icterus.  Neck: Normal range of motion. Neck supple. No JVD present. No tracheal deviation present. No thyromegaly present.  Cardiovascular: Normal rate, regular rhythm, normal heart sounds and intact distal pulses.  Exam reveals no gallop and no friction rub.     No murmur heard. Pulmonary/Chest: Effort normal and breath sounds normal. No stridor. No respiratory distress. She has no wheezes. She has no rales. She exhibits no tenderness.  Abdominal: Soft. Bowel sounds are normal. She exhibits no distension and no mass. There is no tenderness. There is no rebound and no guarding.  Musculoskeletal:       Left ankle: She exhibits decreased range of motion and swelling. She exhibits no ecchymosis, no deformity, no laceration and normal pulse. Tenderness. Lateral malleolus tenderness found. Achilles tendon normal. Achilles tendon exhibits no pain, no defect and normal Thompson's test results.       Left foot: There is tenderness, bony tenderness, swelling and deformity (faint swelling, tenderness, ecchymosis over the dorsom of the forefoot). There is normal range of motion.  Lymphadenopathy:    She has no cervical adenopathy.  Neurological: She is oriented to person, place, and time.  Skin: Skin is warm and dry. No rash noted. She is not diaphoretic. No erythema. No pallor.  Vitals reviewed.   Lab Results  Component Value Date   WBC 9.1 01/29/2015   HGB 14.0 01/29/2015   HCT 41.9 01/29/2015   PLT 295.0 01/29/2015   GLUCOSE 126 (H) 01/30/2016   CHOL 183 07/29/2015   TRIG 202.0 (H) 07/29/2015   HDL 47.30 07/29/2015   LDLDIRECT 102.0 07/29/2015   LDLCALC 82 09/14/2013   ALT 15 06/12/2013   AST 18 06/12/2013   NA 139 01/30/2016   K 4.3 01/30/2016   CL 106 01/30/2016   CREATININE 0.86 01/30/2016   BUN 11 01/30/2016   CO2 30 01/30/2016   TSH 3.68 09/14/2013   HGBA1C 6.0 01/30/2016   MICROALBUR 0.4 01/30/2016    Dg Abd Acute W/chest  Result Date: 06/01/2016 CLINICAL DATA:  Recent fall, pain with inhalation, RIGHT flank pain, history asthma, diabetes mellitus, hypertension, bronchitis EXAM: DG ABDOMEN ACUTE W/ 1V CHEST COMPARISON:  05/15/2013 FINDINGS: Normal heart size, mediastinal contours and pulmonary vascularity. Atherosclerotic calcification  aorta. Bibasilar atelectasis. Eventration RIGHT diaphragm stable. No acute infiltrate, pleural effusion or pneumothorax. Bones demineralized. Increased stool in colon vertically RIGHT colon. No bowel dilatation, bowel wall thickening, or free intraperitoneal air. Surgical clips RIGHT upper quadrant likely reflect cholecystectomy. No urinary tract calcification. Osseous demineralization with levoconvex thoracolumbar scoliosis. IMPRESSION: Bibasilar atelectasis. Increased stool in colon. No additional acute abdominal findings. Electronically Signed   By: Lavonia Dana M.D.   On: 06/01/2016 10:28   Dg Ankle Complete Left  Result Date: 08/11/2016 CLINICAL DATA:  Left ankle pain and swelling after fall 1 week ago. EXAM: LEFT ANKLE COMPLETE - 3+ VIEW COMPARISON:  None. FINDINGS: Mildly displaced fracture is seen involving the lateral malleolus. Joint spaces are intact. No other fracture or bony abnormality is noted. IMPRESSION: Mildly displaced lateral malleolar fracture. Electronically Signed   By: Marijo Conception, M.D.   On: 08/11/2016 15:01  Dg Foot Complete Left  Result Date: 08/11/2016 CLINICAL DATA:  Left foot pain and swelling after fall 1 week ago. EXAM: LEFT FOOT - COMPLETE 3+ VIEW COMPARISON:  None. FINDINGS: Mildly displaced oblique fracture is seen involving the distal fifth metatarsal. Joint spaces are intact. No soft tissue abnormality is noted. IMPRESSION: Mildly displaced distal fifth metatarsal fracture. Electronically Signed   By: Marijo Conception, M.D.   On: 08/11/2016 15:02     Assessment & Plan:   Jaretzi was seen today for leg injury.  Diagnoses and all orders for this visit:  Foot injury, left, initial encounter- plain film shows a mildly to displaced oblique fracture involving the distal fifth metatarsal, I've ordered an urgent referral to orthopedics -     DG Foot Complete Left; Future  Left ankle injury, initial encounter -     DG Ankle Complete Left; Future  Closed malleolar  fracture, unspecified laterality, initial encounter- plain film shows a mildly displaced fracture over the lateral malleolus, I have ordered an urgent referral to orthopedics to see if she needs to be casted or splinted. -     Ambulatory referral to Orthopedic Surgery   I am having Ms. Kallenberger maintain her aspirin, cetirizine, acetaminophen, furosemide, PROAIR HFA, atorvastatin, fluocinonide-emollient, dicyclomine, fluticasone, NIFEdipine, ALPRAZolam, Vitamin D (Ergocalciferol), losartan, and fluticasone furoate-vilanterol.  No orders of the defined types were placed in this encounter.    Follow-up: Return in about 3 weeks (around 09/01/2016).  Scarlette Calico, MD

## 2016-08-12 ENCOUNTER — Encounter: Payer: Self-pay | Admitting: Internal Medicine

## 2016-08-12 DIAGNOSIS — S82832A Other fracture of upper and lower end of left fibula, initial encounter for closed fracture: Secondary | ICD-10-CM | POA: Diagnosis not present

## 2016-08-13 DIAGNOSIS — R269 Unspecified abnormalities of gait and mobility: Secondary | ICD-10-CM | POA: Diagnosis not present

## 2016-08-26 DIAGNOSIS — S92355A Nondisplaced fracture of fifth metatarsal bone, left foot, initial encounter for closed fracture: Secondary | ICD-10-CM | POA: Diagnosis not present

## 2016-09-01 ENCOUNTER — Ambulatory Visit (INDEPENDENT_AMBULATORY_CARE_PROVIDER_SITE_OTHER): Payer: Medicare Other | Admitting: Internal Medicine

## 2016-09-01 ENCOUNTER — Other Ambulatory Visit (INDEPENDENT_AMBULATORY_CARE_PROVIDER_SITE_OTHER): Payer: Medicare Other

## 2016-09-01 ENCOUNTER — Encounter: Payer: Self-pay | Admitting: Internal Medicine

## 2016-09-01 VITALS — BP 130/78 | HR 100 | Temp 98.4°F | Resp 16 | Ht 64.0 in | Wt 163.1 lb

## 2016-09-01 DIAGNOSIS — I10 Essential (primary) hypertension: Secondary | ICD-10-CM

## 2016-09-01 DIAGNOSIS — E785 Hyperlipidemia, unspecified: Secondary | ICD-10-CM

## 2016-09-01 DIAGNOSIS — I2510 Atherosclerotic heart disease of native coronary artery without angina pectoris: Secondary | ICD-10-CM | POA: Diagnosis not present

## 2016-09-01 DIAGNOSIS — E781 Pure hyperglyceridemia: Secondary | ICD-10-CM

## 2016-09-01 DIAGNOSIS — E118 Type 2 diabetes mellitus with unspecified complications: Secondary | ICD-10-CM

## 2016-09-01 LAB — COMPREHENSIVE METABOLIC PANEL
ALT: 9 U/L (ref 0–35)
AST: 12 U/L (ref 0–37)
Albumin: 4.5 g/dL (ref 3.5–5.2)
Alkaline Phosphatase: 73 U/L (ref 39–117)
BUN: 12 mg/dL (ref 6–23)
CHLORIDE: 107 meq/L (ref 96–112)
CO2: 26 mEq/L (ref 19–32)
CREATININE: 0.81 mg/dL (ref 0.40–1.20)
Calcium: 10.2 mg/dL (ref 8.4–10.5)
GFR: 72.36 mL/min (ref >60.00)
GLUCOSE: 131 mg/dL — AB (ref 70–99)
Potassium: 4 mEq/L (ref 3.5–5.1)
SODIUM: 140 meq/L (ref 135–145)
Total Bilirubin: 0.4 mg/dL (ref 0.2–1.2)
Total Protein: 7.9 g/dL (ref 6.0–8.3)

## 2016-09-01 LAB — LIPID PANEL
Cholesterol: 167 mg/dL (ref 0–200)
HDL: 46.3 mg/dL (ref >39.00)
LDL CALC: 99 mg/dL (ref 0–99)
NONHDL: 120.72
Total CHOL/HDL Ratio: 4
Triglycerides: 111 mg/dL (ref 0.0–149.0)
VLDL: 22.2 mg/dL (ref 0.0–40.0)

## 2016-09-01 LAB — CBC WITH DIFFERENTIAL/PLATELET
BASOS PCT: 0.8 % (ref 0.0–3.0)
Basophils Absolute: 0.1 10*3/uL (ref 0.0–0.1)
EOS ABS: 0.1 10*3/uL (ref 0.0–0.7)
EOS PCT: 1.2 % (ref 0.0–5.0)
HCT: 41.2 % (ref 36.0–46.0)
Hemoglobin: 13.5 g/dL (ref 12.0–15.0)
LYMPHS ABS: 1.8 10*3/uL (ref 0.7–4.0)
Lymphocytes Relative: 19.7 % (ref 12.0–46.0)
MCHC: 32.7 g/dL (ref 30.0–36.0)
MCV: 96.7 fl (ref 78.0–100.0)
MONO ABS: 0.8 10*3/uL (ref 0.1–1.0)
Monocytes Relative: 8.4 % (ref 3.0–12.0)
NEUTROS ABS: 6.4 10*3/uL (ref 1.4–7.7)
NEUTROS PCT: 69.9 % (ref 43.0–77.0)
PLATELETS: 369 10*3/uL (ref 150.0–400.0)
RBC: 4.26 Mil/uL (ref 3.87–5.11)
RDW: 13.8 % (ref 11.5–15.5)
WBC: 9.1 10*3/uL (ref 4.0–10.5)

## 2016-09-01 LAB — HEMOGLOBIN A1C: HEMOGLOBIN A1C: 6.3 % (ref 4.6–6.5)

## 2016-09-01 NOTE — Progress Notes (Signed)
Pre visit review using our clinic review tool, if applicable. No additional management support is needed unless otherwise documented below in the visit note. 

## 2016-09-01 NOTE — Progress Notes (Signed)
Subjective:  Patient ID: Nichole Silva, female    DOB: Mar 10, 1935  Age: 82 y.o. MRN: [id number]  CC: Hypertension; Hyperlipidemia; and Diabetes   HPI Nichole Silva presents for f/up - she has a cast on her LLE for for the fractures, she feels well and offers no complaints.  Outpatient Medications Prior to Visit  Medication Sig Dispense Refill  . acetaminophen (TYLENOL) 325 MG tablet Take 325 mg by mouth every 6 (six) hours as needed for mild pain or moderate pain. Pain/headache    . ALPRAZolam (XANAX) 0.5 MG tablet TAKE 1/2 TO 1 TABLET BY MOUTH 3 TIMES A DAY AS NEEDED 90 tablet 5  . aspirin 81 MG tablet Take 81 mg by mouth daily.      Marland Kitchen atorvastatin (LIPITOR) 40 MG tablet TAKE 1 TABLET (40 MG TOTAL) BY MOUTH DAILY AT 6 PM. 90 tablet 3  . cetirizine (ZYRTEC) 10 MG tablet Take 10 mg by mouth daily.      Marland Kitchen dicyclomine (BENTYL) 10 MG capsule TAKE ONE CAPSULE BY MOUTH 3 TIMES A DAY BEFORE MEALS 90 capsule 7  . fluocinonide-emollient (LIDEX-E) 0.05 % cream Apply 1 application topically 2 (two) times daily. 60 g 2  . fluticasone (FLONASE) 50 MCG/ACT nasal spray USE 2 SPRAYS IN EACH NOSTRIL AT BEDTIME 16 g 11  . fluticasone furoate-vilanterol (BREO ELLIPTA) 200-25 MCG/INH AEPB Inhale 1 puff into the lungs daily. 60 each 1  . furosemide (LASIX) 40 MG tablet TAKE 1 TO 2 TABLETS BY MOUTH EVERY DAY 30 tablet 11  . losartan (COZAAR) 50 MG tablet Take 1 tablet (50 mg total) by mouth daily. 90 tablet 3  . NIFEdipine (PROCARDIA XL/ADALAT-CC) 60 MG 24 hr tablet Take 1 tablet (60 mg total) by mouth daily. 90 tablet 3  . PROAIR HFA 108 (90 Base) MCG/ACT inhaler INHALE 1 TO 2 PUFFS BY MOUTH EVERY 4 HOURS AS NEEDED FOR WHEEZE OR SHORTNESS OF BREATH 8.5 Inhaler 11  . Vitamin D, Ergocalciferol, (DRISDOL) 50000 units CAPS capsule TAKE ONE CAPSULE BY MOUTH EVERY MONDAY 12 capsule 3   No facility-administered medications prior to visit.     ROS Review of Systems  Constitutional: Negative for diaphoresis,  fatigue and unexpected weight change.  HENT: Negative.   Eyes: Negative.  Negative for visual disturbance.  Respiratory: Negative for cough, chest tightness, shortness of breath and wheezing.   Cardiovascular: Negative for chest pain, palpitations and leg swelling.  Gastrointestinal: Negative for abdominal pain, constipation, diarrhea, nausea and vomiting.  Endocrine: Negative.  Negative for polydipsia, polyphagia and polyuria.  Genitourinary: Negative.   Musculoskeletal: Negative.  Negative for arthralgias and myalgias.  Skin: Negative.   Allergic/Immunologic: Negative.   Neurological: Negative.  Negative for dizziness, tremors, weakness and numbness.  Hematological: Negative.  Negative for adenopathy. Does not bruise/bleed easily.  Psychiatric/Behavioral: Negative.     Objective:  BP 130/78 (BP Location: Left Arm, Patient Position: Sitting, Cuff Size: Normal)   Pulse 100   Temp 98.4 F (36.9 C) (Oral)   Resp 16   Ht 5\' 4"  (1.626 m)   Wt 163 lb 1.3 oz (74 kg)   SpO2 96%   BMI 27.99 kg/m   BP Readings from Last 3 Encounters:  09/01/16 130/78  08/11/16 122/62  06/01/16 (!) 160/78    Wt Readings from Last 3 Encounters:  09/01/16 163 lb 1.3 oz (74 kg)  08/11/16 165 lb 0.6 oz (74.9 kg)  06/01/16 171 lb (77.6 kg)    Physical Exam  Constitutional: She  is oriented to person, place, and time. No distress.  HENT:  Mouth/Throat: Oropharynx is clear and moist.  Eyes: Conjunctivae are normal. Right eye exhibits no discharge. Left eye exhibits no discharge. No scleral icterus.  Neck: Normal range of motion. Neck supple. No JVD present. No tracheal deviation present. No thyromegaly present.  Cardiovascular: Normal rate, regular rhythm, normal heart sounds and intact distal pulses.  Exam reveals no gallop and no friction rub.   No murmur heard. Pulmonary/Chest: Effort normal and breath sounds normal. No stridor. No respiratory distress. She has no wheezes. She has no rales. She  exhibits no tenderness.  Abdominal: Soft. Bowel sounds are normal. She exhibits no distension and no mass. There is no tenderness. There is no rebound and no guarding.  Musculoskeletal: Normal range of motion. She exhibits no edema, tenderness or deformity.  Lymphadenopathy:    She has no cervical adenopathy.  Neurological: She is oriented to person, place, and time.  Skin: Skin is warm and dry. No rash noted. She is not diaphoretic. No erythema. No pallor.  Vitals reviewed.   Lab Results  Component Value Date   WBC 9.1 09/01/2016   HGB 13.5 09/01/2016   HCT 41.2 09/01/2016   PLT 369.0 09/01/2016   GLUCOSE 131 (H) 09/01/2016   CHOL 167 09/01/2016   TRIG 111.0 09/01/2016   HDL 46.30 09/01/2016   LDLDIRECT 102.0 07/29/2015   LDLCALC 99 09/01/2016   ALT 9 09/01/2016   AST 12 09/01/2016   NA 140 09/01/2016   K 4.0 09/01/2016   CL 107 09/01/2016   CREATININE 0.81 09/01/2016   BUN 12 09/01/2016   CO2 26 09/01/2016   TSH 3.68 09/14/2013   HGBA1C 6.3 09/01/2016   MICROALBUR 0.4 01/30/2016    Dg Ankle Complete Left  Result Date: 08/11/2016 CLINICAL DATA:  Left ankle pain and swelling after fall 1 week ago. EXAM: LEFT ANKLE COMPLETE - 3+ VIEW COMPARISON:  None. FINDINGS: Mildly displaced fracture is seen involving the lateral malleolus. Joint spaces are intact. No other fracture or bony abnormality is noted. IMPRESSION: Mildly displaced lateral malleolar fracture. Electronically Signed   By: Marijo Conception, M.D.   On: 08/11/2016 15:01   Dg Foot Complete Left  Result Date: 08/11/2016 CLINICAL DATA:  Left foot pain and swelling after fall 1 week ago. EXAM: LEFT FOOT - COMPLETE 3+ VIEW COMPARISON:  None. FINDINGS: Mildly displaced oblique fracture is seen involving the distal fifth metatarsal. Joint spaces are intact. No soft tissue abnormality is noted. IMPRESSION: Mildly displaced distal fifth metatarsal fracture. Electronically Signed   By: Marijo Conception, M.D.   On: 08/11/2016 15:02     Assessment & Plan:   Nichole Silva was seen today for hypertension, hyperlipidemia and diabetes.  Diagnoses and all orders for this visit:  Essential hypertension- her lites and renal function are normal, her blood pressure is well-controlled -     Comprehensive metabolic panel; Future -     CBC with Differential/Platelet; Future  Type 2 diabetes mellitus with complication, without long-term current use of insulin ([place])- her A1c is down to 6%, her blood sugars are well-controlled -     Comprehensive metabolic panel; Future -     Hemoglobin A1c; Future  Atherosclerosis of native coronary artery of native heart without angina pectoris- she's had no recent episodes of angina, will continue to modify her risk factors with blood sugar control, blood pressure control, statin and aspirin therapy. -     Lipid panel; Future  Hyperlipidemia with target LDL less than 100- she has achieved her LDL goal and is doing well on the statin. -     Lipid panel; Future  Hypertriglyceridemia- her triglycerides are normal now, no medical therapy warranted -     Lipid panel; Future   I am having Nichole Silva maintain her aspirin, cetirizine, acetaminophen, furosemide, PROAIR HFA, atorvastatin, fluocinonide-emollient, dicyclomine, fluticasone, NIFEdipine, ALPRAZolam, Vitamin D (Ergocalciferol), losartan, and fluticasone furoate-vilanterol.  No orders of the defined types were placed in this encounter.    Follow-up: Return in about 6 months (around 03/03/2017).  Scarlette Calico, MD

## 2016-09-01 NOTE — Patient Instructions (Signed)

## 2016-09-02 ENCOUNTER — Encounter: Payer: Self-pay | Admitting: Internal Medicine

## 2016-09-06 ENCOUNTER — Other Ambulatory Visit: Payer: Self-pay | Admitting: Cardiology

## 2016-09-17 DIAGNOSIS — S92355D Nondisplaced fracture of fifth metatarsal bone, left foot, subsequent encounter for fracture with routine healing: Secondary | ICD-10-CM | POA: Diagnosis not present

## 2016-10-01 ENCOUNTER — Other Ambulatory Visit: Payer: Self-pay | Admitting: Internal Medicine

## 2016-10-01 DIAGNOSIS — J4540 Moderate persistent asthma, uncomplicated: Secondary | ICD-10-CM

## 2016-10-07 ENCOUNTER — Encounter: Payer: Self-pay | Admitting: Gynecology

## 2016-10-07 ENCOUNTER — Other Ambulatory Visit: Payer: Self-pay | Admitting: Cardiology

## 2016-10-09 DIAGNOSIS — S92355D Nondisplaced fracture of fifth metatarsal bone, left foot, subsequent encounter for fracture with routine healing: Secondary | ICD-10-CM | POA: Diagnosis not present

## 2016-10-15 ENCOUNTER — Encounter: Payer: Self-pay | Admitting: Internal Medicine

## 2016-10-16 MED ORDER — FUROSEMIDE 40 MG PO TABS: 40.0000 mg | ORAL_TABLET | Freq: Every day | ORAL | 1 refills | Status: DC

## 2016-10-29 DIAGNOSIS — N390 Urinary tract infection, site not specified: Secondary | ICD-10-CM | POA: Diagnosis not present

## 2016-10-29 DIAGNOSIS — R350 Frequency of micturition: Secondary | ICD-10-CM | POA: Diagnosis not present

## 2016-10-29 DIAGNOSIS — R319 Hematuria, unspecified: Secondary | ICD-10-CM | POA: Diagnosis not present

## 2016-11-10 ENCOUNTER — Other Ambulatory Visit: Payer: Self-pay | Admitting: Cardiology

## 2016-11-12 ENCOUNTER — Other Ambulatory Visit: Payer: Self-pay | Admitting: Cardiology

## 2016-11-12 DIAGNOSIS — N390 Urinary tract infection, site not specified: Secondary | ICD-10-CM | POA: Diagnosis not present

## 2016-11-13 DIAGNOSIS — S92355D Nondisplaced fracture of fifth metatarsal bone, left foot, subsequent encounter for fracture with routine healing: Secondary | ICD-10-CM | POA: Diagnosis not present

## 2016-11-14 ENCOUNTER — Encounter: Payer: Self-pay | Admitting: Internal Medicine

## 2016-11-17 MED ORDER — ATORVASTATIN CALCIUM 40 MG PO TABS: 40.0000 mg | ORAL_TABLET | Freq: Every day | ORAL | 1 refills | Status: DC

## 2016-12-11 ENCOUNTER — Other Ambulatory Visit: Payer: Self-pay | Admitting: Internal Medicine

## 2017-02-02 DIAGNOSIS — Z124 Encounter for screening for malignant neoplasm of cervix: Secondary | ICD-10-CM | POA: Diagnosis not present

## 2017-02-02 DIAGNOSIS — N819 Female genital prolapse, unspecified: Secondary | ICD-10-CM | POA: Diagnosis not present

## 2017-02-02 DIAGNOSIS — Z01419 Encounter for gynecological examination (general) (routine) without abnormal findings: Secondary | ICD-10-CM | POA: Diagnosis not present

## 2017-02-02 DIAGNOSIS — Z1231 Encounter for screening mammogram for malignant neoplasm of breast: Secondary | ICD-10-CM | POA: Diagnosis not present

## 2017-02-02 DIAGNOSIS — R8290 Unspecified abnormal findings in urine: Secondary | ICD-10-CM | POA: Diagnosis not present

## 2017-02-02 DIAGNOSIS — Z1389 Encounter for screening for other disorder: Secondary | ICD-10-CM | POA: Diagnosis not present

## 2017-02-02 LAB — HM MAMMOGRAPHY: HM MAMMO: NORMAL (ref 0–4)

## 2017-02-02 LAB — HM PAP SMEAR

## 2017-03-02 ENCOUNTER — Ambulatory Visit: Payer: Medicare Other | Admitting: Internal Medicine

## 2017-03-05 ENCOUNTER — Telehealth: Payer: Self-pay

## 2017-03-05 DIAGNOSIS — J4540 Moderate persistent asthma, uncomplicated: Secondary | ICD-10-CM

## 2017-03-05 NOTE — Telephone Encounter (Signed)
Pt dtr came in requesting sample. 1 box given and noted in sample book.

## 2017-03-08 ENCOUNTER — Ambulatory Visit (INDEPENDENT_AMBULATORY_CARE_PROVIDER_SITE_OTHER): Payer: Medicare Other | Admitting: Internal Medicine

## 2017-03-08 ENCOUNTER — Other Ambulatory Visit (INDEPENDENT_AMBULATORY_CARE_PROVIDER_SITE_OTHER): Payer: Medicare Other

## 2017-03-08 ENCOUNTER — Ambulatory Visit (INDEPENDENT_AMBULATORY_CARE_PROVIDER_SITE_OTHER)
Admission: RE | Admit: 2017-03-08 | Discharge: 2017-03-08 | Disposition: A | Discharge status: Home / Self Care | Payer: Medicare Other | Source: Ambulatory Visit | Attending: Internal Medicine | Admitting: Internal Medicine

## 2017-03-08 ENCOUNTER — Encounter: Payer: Self-pay | Admitting: Internal Medicine

## 2017-03-08 VITALS — BP 130/70 | HR 88 | Temp 97.8°F | Resp 16 | Ht 64.0 in | Wt 153.2 lb

## 2017-03-08 DIAGNOSIS — M8000XA Age-related osteoporosis with current pathological fracture, unspecified site, initial encounter for fracture: Secondary | ICD-10-CM

## 2017-03-08 DIAGNOSIS — E2839 Other primary ovarian failure: Secondary | ICD-10-CM | POA: Diagnosis not present

## 2017-03-08 DIAGNOSIS — E118 Type 2 diabetes mellitus with unspecified complications: Secondary | ICD-10-CM | POA: Diagnosis not present

## 2017-03-08 DIAGNOSIS — E559 Vitamin D deficiency, unspecified: Secondary | ICD-10-CM

## 2017-03-08 DIAGNOSIS — I10 Essential (primary) hypertension: Secondary | ICD-10-CM

## 2017-03-08 DIAGNOSIS — Z23 Encounter for immunization: Secondary | ICD-10-CM

## 2017-03-08 LAB — URINALYSIS, ROUTINE W REFLEX MICROSCOPIC
BILIRUBIN URINE: NEGATIVE
KETONES UR: NEGATIVE
NITRITE: NEGATIVE
PH: 8 (ref 5.0–8.0)
Specific Gravity, Urine: 1.02 (ref 1.000–1.030)
Total Protein, Urine: 100 — AB
Urine Glucose: NEGATIVE
Urobilinogen, UA: 0.2 (ref 0.0–1.0)

## 2017-03-08 LAB — BASIC METABOLIC PANEL
BUN: 13 mg/dL (ref 6–23)
CHLORIDE: 104 meq/L (ref 96–112)
CO2: 26 mEq/L (ref 19–32)
CREATININE: 0.76 mg/dL (ref 0.40–1.20)
Calcium: 10 mg/dL (ref 8.4–10.5)
GFR: 77.78 mL/min (ref >60.00)
GLUCOSE: 122 mg/dL — AB (ref 70–99)
Potassium: 3.8 mEq/L (ref 3.5–5.1)
Sodium: 141 mEq/L (ref 135–145)

## 2017-03-08 LAB — MICROALBUMIN / CREATININE URINE RATIO
CREATININE, U: 106.3 mg/dL
MICROALB UR: 88.8 mg/dL — AB (ref 0.0–1.9)
MICROALB/CREAT RATIO: 83.6 mg/g — AB (ref 0.0–30.0)

## 2017-03-08 LAB — VITAMIN D 25 HYDROXY (VIT D DEFICIENCY, FRACTURES): VITD: 88.37 ng/mL (ref 30.00–100.00)

## 2017-03-08 LAB — HEMOGLOBIN A1C: HEMOGLOBIN A1C: 6.3 % (ref 4.6–6.5)

## 2017-03-08 NOTE — Patient Instructions (Signed)

## 2017-03-08 NOTE — Progress Notes (Signed)
Subjective:  Patient ID: Nichole Silva, female    DOB: 1934-09-08  Age: 82 y.o. MRN: [id number]  CC: Hypertension and Diabetes   HPI Nichole Silva presents for f/up - she feels well and offers no complaints.she wants to have a DEXA scan completed.  Outpatient Medications Prior to Visit  Medication Sig Dispense Refill  . acetaminophen (TYLENOL) 325 MG tablet Take 325 mg by mouth every 6 (six) hours as needed for mild pain or moderate pain. Pain/headache    . ALPRAZolam (XANAX) 0.5 MG tablet TAKE 1/2 TO 1 TABLET BY MOUTH 3 TIMES A DAY AS NEEDED 90 tablet 5  . aspirin 81 MG tablet Take 81 mg by mouth daily.      Marland Kitchen atorvastatin (LIPITOR) 40 MG tablet Take 1 tablet (40 mg total) by mouth daily at 6 PM. 90 tablet 1  . BREO ELLIPTA 200-25 MCG/INH AEPB INHALE 1 PUFF INTO THE LUNGS DAILY. 30 each 11  . cetirizine (ZYRTEC) 10 MG tablet Take 10 mg by mouth daily.      Marland Kitchen dicyclomine (BENTYL) 10 MG capsule TAKE ONE CAPSULE BY MOUTH 3 TIMES A DAY BEFORE MEALS 90 capsule 7  . fluocinonide-emollient (LIDEX-E) 0.05 % cream Apply 1 application topically 2 (two) times daily. 60 g 2  . fluticasone (FLONASE) 50 MCG/ACT nasal spray USE 2 SPRAYS IN EACH NOSTRIL AT BEDTIME 16 g 11  . furosemide (LASIX) 40 MG tablet Take 1-2 tablets (40-80 mg total) by mouth daily. 180 tablet 1  . losartan (COZAAR) 50 MG tablet Take 1 tablet (50 mg total) by mouth daily. 90 tablet 3  . NIFEdipine (PROCARDIA XL/ADALAT-CC) 60 MG 24 hr tablet Take 1 tablet (60 mg total) by mouth daily. 90 tablet 3  . PROAIR HFA 108 (90 Base) MCG/ACT inhaler INHALE 1 TO 2 PUFFS BY MOUTH EVERY 4 HOURS AS NEEDED FOR WHEEZE OR SHORTNESS OF BREATH 8.5 Inhaler 11  . Vitamin D, Ergocalciferol, (DRISDOL) 50000 units CAPS capsule TAKE ONE CAPSULE BY MOUTH EVERY MONDAY 12 capsule 3   No facility-administered medications prior to visit.     ROS Review of Systems  Constitutional: Negative.  Negative for activity change, appetite change, fatigue and fever.    HENT: Negative.  Negative for trouble swallowing.   Eyes: Negative.   Respiratory: Negative.  Negative for cough, chest tightness, shortness of breath and wheezing.   Cardiovascular: Negative for chest pain, palpitations and leg swelling.  Gastrointestinal: Negative for abdominal pain, constipation, diarrhea, nausea and vomiting.  Endocrine: Negative.  Negative for polydipsia, polyphagia and polyuria.  Genitourinary: Negative.  Negative for difficulty urinating.  Musculoskeletal: Negative.  Negative for arthralgias, back pain, myalgias and neck pain.  Skin: Negative.  Negative for color change and rash.  Allergic/Immunologic: Negative.   Neurological: Negative.  Negative for dizziness, weakness and numbness.  Hematological: Negative for adenopathy. Does not bruise/bleed easily.  Psychiatric/Behavioral: Negative for decreased concentration, dysphoric mood and sleep disturbance. The patient is nervous/anxious.     Objective:  BP 130/70 (BP Location: Left Arm, Patient Position: Sitting, Cuff Size: Large)   Pulse 88   Temp 97.8 F (36.6 C) (Oral)   Resp 16   Ht 5\' 4"  (1.626 m)   Wt 153 lb 4 oz (69.5 kg)   SpO2 95%   BMI 26.31 kg/m   BP Readings from Last 3 Encounters:  03/08/17 130/70  09/01/16 130/78  08/11/16 122/62    Wt Readings from Last 3 Encounters:  03/08/17 153 lb 4 oz (69.5 kg)  09/01/16 163 lb 1.3 oz (74 kg)  08/11/16 165 lb 0.6 oz (74.9 kg)    Physical Exam  Constitutional: She is oriented to person, place, and time. No distress.  HENT:  Mouth/Throat: Oropharynx is clear and moist. No oropharyngeal exudate.  Eyes: Conjunctivae are normal. Right eye exhibits no discharge. Left eye exhibits no discharge. No scleral icterus.  Neck: Normal range of motion. Neck supple. No JVD present. No thyromegaly present.  Cardiovascular: Normal rate, regular rhythm and intact distal pulses.  Exam reveals no gallop and no friction rub.   No murmur heard. Pulmonary/Chest: Effort  normal and breath sounds normal. No respiratory distress. She has no wheezes. She has no rales. She exhibits no tenderness.  Abdominal: Soft. Bowel sounds are normal. She exhibits no distension and no mass. There is no tenderness. There is no rebound and no guarding.  Musculoskeletal: Normal range of motion. She exhibits edema. She exhibits no tenderness or deformity.  ++ BLE non-pitting edema  Lymphadenopathy:    She has no cervical adenopathy.  Neurological: She is alert and oriented to person, place, and time.  Skin: Skin is warm and dry. No rash noted. She is not diaphoretic. No erythema. No pallor.  Psychiatric: She has a normal mood and affect. Her behavior is normal. Judgment and thought content normal.  Vitals reviewed.   Lab Results  Component Value Date   WBC 9.1 09/01/2016   HGB 13.5 09/01/2016   HCT 41.2 09/01/2016   PLT 369.0 09/01/2016   GLUCOSE 122 (H) 03/08/2017   CHOL 167 09/01/2016   TRIG 111.0 09/01/2016   HDL 46.30 09/01/2016   LDLDIRECT 102.0 07/29/2015   LDLCALC 99 09/01/2016   ALT 9 09/01/2016   AST 12 09/01/2016   NA 141 03/08/2017   K 3.8 03/08/2017   CL 104 03/08/2017   CREATININE 0.76 03/08/2017   BUN 13 03/08/2017   CO2 26 03/08/2017   TSH 3.68 09/14/2013   HGBA1C 6.3 03/08/2017   MICROALBUR 88.8 (H) 03/08/2017    Dg Ankle Complete Left  Result Date: 08/11/2016 CLINICAL DATA:  Left ankle pain and swelling after fall 1 week ago. EXAM: LEFT ANKLE COMPLETE - 3+ VIEW COMPARISON:  None. FINDINGS: Mildly displaced fracture is seen involving the lateral malleolus. Joint spaces are intact. No other fracture or bony abnormality is noted. IMPRESSION: Mildly displaced lateral malleolar fracture. Electronically Signed   By: Marijo Conception, M.D.   On: 08/11/2016 15:01   Dg Foot Complete Left  Result Date: 08/11/2016 CLINICAL DATA:  Left foot pain and swelling after fall 1 week ago. EXAM: LEFT FOOT - COMPLETE 3+ VIEW COMPARISON:  None. FINDINGS: Mildly  displaced oblique fracture is seen involving the distal fifth metatarsal. Joint spaces are intact. No soft tissue abnormality is noted. IMPRESSION: Mildly displaced distal fifth metatarsal fracture. Electronically Signed   By: Marijo Conception, M.D.   On: 08/11/2016 15:02    Assessment & Plan:   Anokhi was seen today for hypertension and diabetes.  Diagnoses and all orders for this visit:  Estrogen deficiency -     DG Bone Density; Future  Age-related osteoporosis with current pathological fracture, initial encounter -     DG Bone Density; Future -     VITAMIN D 25 Hydroxy (Vit-D Deficiency, Fractures); Future  Vitamin D deficiency- Her vitamin D level is normal -     VITAMIN D 25 Hydroxy (Vit-D Deficiency, Fractures); Future  Type 2 diabetes mellitus with complication, without long-term current use  of insulin ([place])- her blood sugars are adequately well-controlled, she does not need a blood sugar lowering agent -     Basic metabolic panel; Future -     Hemoglobin A1c; Future -     Microalbumin / creatinine urine ratio; Future  Essential hypertension- her blood pressure is well-controlled, electrolytes and renal function are normal. She will continue taking the loop diuretic, ARB, and CCP. -     Basic metabolic panel; Future -     Urinalysis, Routine w reflex microscopic; Future  Need for influenza vaccination -     Flu vaccine HIGH DOSE PF (Fluzone High dose)   I am having Ms. Ranes maintain her aspirin, cetirizine, acetaminophen, PROAIR HFA, fluocinonide-emollient, dicyclomine, fluticasone, NIFEdipine, Vitamin D (Ergocalciferol), losartan, BREO ELLIPTA, furosemide, atorvastatin, ALPRAZolam, and fluticasone furoate-vilanterol.  Meds ordered this encounter  Medications  . fluticasone furoate-vilanterol (BREO ELLIPTA) 200-25 MCG/INH AEPB    Sig: Breo Ellipta 200 mcg-25 mcg/dose powder for inhalation     Follow-up: Return in about 6 months (around 09/06/2017).  Scarlette Calico,  MD

## 2017-03-11 ENCOUNTER — Encounter: Payer: Self-pay | Admitting: Internal Medicine

## 2017-03-15 ENCOUNTER — Encounter: Payer: Self-pay | Admitting: Internal Medicine

## 2017-03-15 LAB — HM DEXA SCAN: HM Dexa Scan: -1.9

## 2017-06-04 ENCOUNTER — Other Ambulatory Visit: Payer: Self-pay | Admitting: Internal Medicine

## 2017-06-08 ENCOUNTER — Other Ambulatory Visit: Payer: Self-pay | Admitting: Internal Medicine

## 2017-06-22 ENCOUNTER — Other Ambulatory Visit: Payer: Self-pay | Admitting: Internal Medicine

## 2017-06-25 ENCOUNTER — Other Ambulatory Visit: Payer: Self-pay | Admitting: Internal Medicine

## 2017-06-25 DIAGNOSIS — IMO0002 Reserved for concepts with insufficient information to code with codable children: Secondary | ICD-10-CM

## 2017-06-25 DIAGNOSIS — E1165 Type 2 diabetes mellitus with hyperglycemia: Secondary | ICD-10-CM

## 2017-06-25 DIAGNOSIS — I10 Essential (primary) hypertension: Secondary | ICD-10-CM

## 2017-07-23 ENCOUNTER — Other Ambulatory Visit: Payer: Self-pay | Admitting: Internal Medicine

## 2017-07-23 DIAGNOSIS — K589 Irritable bowel syndrome without diarrhea: Secondary | ICD-10-CM

## 2017-07-23 NOTE — Telephone Encounter (Signed)
Copied from [place]. Topic: Inquiry >> Jul 23, 2017  2:46 PM Pricilla Handler wrote: Reason for CRM: Patient's daughter called requesting a refill of patient's dicyclomine (BENTYL) 10 MG capsule. Patient's preferred pharmacy is [id number] [address], [place] [phone] (Phone)      [phone] (Fax).      Thank You!!!

## 2017-09-02 ENCOUNTER — Other Ambulatory Visit: Payer: Self-pay | Admitting: Internal Medicine

## 2017-09-06 ENCOUNTER — Telehealth: Payer: Self-pay | Admitting: Internal Medicine

## 2017-09-06 ENCOUNTER — Ambulatory Visit (INDEPENDENT_AMBULATORY_CARE_PROVIDER_SITE_OTHER): Payer: Medicare Other | Admitting: Internal Medicine

## 2017-09-06 ENCOUNTER — Encounter: Payer: Self-pay | Admitting: Internal Medicine

## 2017-09-06 ENCOUNTER — Other Ambulatory Visit (INDEPENDENT_AMBULATORY_CARE_PROVIDER_SITE_OTHER): Payer: Medicare Other

## 2017-09-06 VITALS — BP 130/70 | HR 68 | Temp 98.1°F | Resp 16 | Ht 64.0 in | Wt 147.0 lb

## 2017-09-06 DIAGNOSIS — E559 Vitamin D deficiency, unspecified: Secondary | ICD-10-CM

## 2017-09-06 DIAGNOSIS — M8000XS Age-related osteoporosis with current pathological fracture, unspecified site, sequela: Secondary | ICD-10-CM

## 2017-09-06 DIAGNOSIS — E785 Hyperlipidemia, unspecified: Secondary | ICD-10-CM

## 2017-09-06 DIAGNOSIS — I2510 Atherosclerotic heart disease of native coronary artery without angina pectoris: Secondary | ICD-10-CM

## 2017-09-06 DIAGNOSIS — E118 Type 2 diabetes mellitus with unspecified complications: Secondary | ICD-10-CM

## 2017-09-06 DIAGNOSIS — I10 Essential (primary) hypertension: Secondary | ICD-10-CM | POA: Diagnosis not present

## 2017-09-06 DIAGNOSIS — N814 Uterovaginal prolapse, unspecified: Secondary | ICD-10-CM

## 2017-09-06 LAB — COMPREHENSIVE METABOLIC PANEL
ALK PHOS: 68 U/L (ref 39–117)
ALT: 10 U/L (ref 0–35)
AST: 13 U/L (ref 0–37)
Albumin: 4.2 g/dL (ref 3.5–5.2)
BILIRUBIN TOTAL: 0.3 mg/dL (ref 0.2–1.2)
BUN: 12 mg/dL (ref 6–23)
CO2: 27 meq/L (ref 19–32)
Calcium: 10 mg/dL (ref 8.4–10.5)
Chloride: 104 mEq/L (ref 96–112)
Creatinine, Ser: 0.81 mg/dL (ref 0.40–1.20)
GFR: 72.17 mL/min (ref >60.00)
GLUCOSE: 102 mg/dL — AB (ref 70–99)
Potassium: 4.6 mEq/L (ref 3.5–5.1)
SODIUM: 139 meq/L (ref 135–145)
TOTAL PROTEIN: 7.6 g/dL (ref 6.0–8.3)

## 2017-09-06 LAB — CBC WITH DIFFERENTIAL/PLATELET
BASOS ABS: 0.1 10*3/uL (ref 0.0–0.1)
Basophils Relative: 0.6 % (ref 0.0–3.0)
Eosinophils Absolute: 0.3 10*3/uL (ref 0.0–0.7)
Eosinophils Relative: 3 % (ref 0.0–5.0)
HCT: 41.4 % (ref 36.0–46.0)
Hemoglobin: 13.7 g/dL (ref 12.0–15.0)
LYMPHS ABS: 2 10*3/uL (ref 0.7–4.0)
Lymphocytes Relative: 22.7 % (ref 12.0–46.0)
MCHC: 33.2 g/dL (ref 30.0–36.0)
MCV: 95.9 fl (ref 78.0–100.0)
MONO ABS: 0.8 10*3/uL (ref 0.1–1.0)
MONOS PCT: 9.1 % (ref 3.0–12.0)
NEUTROS PCT: 64.6 % (ref 43.0–77.0)
Neutro Abs: 5.6 10*3/uL (ref 1.4–7.7)
Platelets: 266 10*3/uL (ref 150.0–400.0)
RBC: 4.31 Mil/uL (ref 3.87–5.11)
RDW: 14.1 % (ref 11.5–15.5)
WBC: 8.7 10*3/uL (ref 4.0–10.5)

## 2017-09-06 LAB — THYROID PANEL WITH TSH
FREE THYROXINE INDEX: 2.4 (ref 1.4–3.8)
T3 Uptake: 25 % (ref 22–35)
T4, Total: 9.7 ug/dL (ref 5.1–11.9)
TSH: 1.06 mIU/L (ref 0.40–4.50)

## 2017-09-06 LAB — URINALYSIS, ROUTINE W REFLEX MICROSCOPIC
BILIRUBIN URINE: NEGATIVE
Ketones, ur: NEGATIVE
NITRITE: NEGATIVE
PH: 5.5 (ref 5.0–8.0)
SPECIFIC GRAVITY, URINE: 1.02 (ref 1.000–1.030)
Total Protein, Urine: 30 — AB
Urine Glucose: NEGATIVE
Urobilinogen, UA: 0.2 (ref 0.0–1.0)

## 2017-09-06 LAB — LIPID PANEL
CHOL/HDL RATIO: 3
Cholesterol: 160 mg/dL (ref 0–200)
HDL: 49.1 mg/dL (ref >39.00)
LDL CALC: 91 mg/dL (ref 0–99)
NONHDL: 111.19
Triglycerides: 99 mg/dL (ref 0.0–149.0)
VLDL: 19.8 mg/dL (ref 0.0–40.0)

## 2017-09-06 LAB — MICROALBUMIN / CREATININE URINE RATIO
Creatinine,U: 57.5 mg/dL
MICROALB UR: 11.6 mg/dL — AB (ref 0.0–1.9)
MICROALB/CREAT RATIO: 20.2 mg/g (ref 0.0–30.0)

## 2017-09-06 LAB — VITAMIN D 25 HYDROXY (VIT D DEFICIENCY, FRACTURES): VITD: 63.03 ng/mL (ref 30.00–100.00)

## 2017-09-06 NOTE — Patient Instructions (Signed)

## 2017-09-06 NOTE — Telephone Encounter (Signed)
Copied from [place]. Topic: Referral - Request >> Sep 06, 2017  4:11 PM Cleaster Corin, [place] wrote: CRM for notification. See Telephone encounter for: 09/06/17.   Reason for CRM: pt. Requesting referral to Dr. Harlow Asa medical urology [phone]

## 2017-09-06 NOTE — Progress Notes (Signed)
Subjective:  Patient ID: Nichole Silva, female    DOB: Nov 10, 1934  Age: 83 y.o. MRN: [id number]  CC: Hypertension; Hyperlipidemia; and Diabetes   HPI Nichole Silva presents for f/up - She tells me her blood pressure and blood sugars have been well controlled.  She complains of fatigue because she is taking care of her dying husband 24/7.  She denies any recent episodes of headache/blurred vision/chest pain/shortness of breath/edema/or polys.  Outpatient Medications Prior to Visit  Medication Sig Dispense Refill  . acetaminophen (TYLENOL) 325 MG tablet Take 325 mg by mouth every 6 (six) hours as needed for mild pain or moderate pain. Pain/headache    . ALPRAZolam (XANAX) 0.5 MG tablet TAKE 1/2 TO 1 TABLET BY MOUTH 3 TIMES A DAY AS NEEDED 90 tablet 3  . aspirin 81 MG tablet Take 81 mg by mouth daily.      Marland Kitchen atorvastatin (LIPITOR) 40 MG tablet Take 1 tablet (40 mg total) by mouth daily at 6 PM. 90 tablet 1  . BREO ELLIPTA 200-25 MCG/INH AEPB INHALE 1 PUFF INTO THE LUNGS DAILY. 30 each 11  . cetirizine (ZYRTEC) 10 MG tablet Take 10 mg by mouth daily.      Marland Kitchen dicyclomine (BENTYL) 10 MG capsule TAKE ONE CAPSULE BY MOUTH 3 TIMES A DAY BEFORE MEALS 90 capsule 6  . fluocinonide-emollient (LIDEX-E) 0.05 % cream Apply 1 application topically 2 (two) times daily. 60 g 2  . fluticasone (FLONASE) 50 MCG/ACT nasal spray USE 2 SPRAYS IN EACH NOSTRIL AT BEDTIME 48 g 1  . fluticasone furoate-vilanterol (BREO ELLIPTA) 200-25 MCG/INH AEPB Breo Ellipta 200 mcg-25 mcg/dose powder for inhalation    . furosemide (LASIX) 40 MG tablet Take 1-2 tablets (40-80 mg total) by mouth daily. 180 tablet 1  . losartan (COZAAR) 50 MG tablet TAKE 1 TABLET BY MOUTH EVERY DAY 90 tablet 1  . NIFEdipine (PROCARDIA XL/ADALAT-CC) 60 MG 24 hr tablet TAKE 1 TABLET BY MOUTH DAILY 90 tablet 1  . PROAIR HFA 108 (90 Base) MCG/ACT inhaler INHALE 1 TO 2 PUFFS BY MOUTH EVERY 4 HOURS AS NEEDED FOR WHEEZE OR SHORTNESS OF BREATH 8.5 Inhaler 11  .  Vitamin D, Ergocalciferol, (DRISDOL) 50000 units CAPS capsule TAKE ONE CAPSULE BY MOUTH EVERY MONDAY 12 capsule 1   No facility-administered medications prior to visit.     ROS Review of Systems  Constitutional: Positive for fatigue. Negative for activity change, appetite change, diaphoresis and unexpected weight change.  HENT: Negative.   Eyes: Negative for visual disturbance.  Respiratory: Negative.  Negative for apnea, chest tightness, shortness of breath and wheezing.   Cardiovascular: Negative for chest pain, palpitations and leg swelling.  Gastrointestinal: Negative for abdominal pain, constipation, diarrhea, nausea and vomiting.  Endocrine: Negative for polydipsia, polyphagia and polyuria.  Genitourinary: Negative.  Negative for difficulty urinating.  Musculoskeletal: Negative.  Negative for arthralgias, back pain, myalgias and neck pain.  Skin: Negative.   Allergic/Immunologic: Negative.   Neurological: Negative.  Negative for dizziness, weakness and light-headedness.  Hematological: Negative for adenopathy. Does not bruise/bleed easily.  Psychiatric/Behavioral: Negative.     Objective:  BP 130/70 (BP Location: Left Arm, Patient Position: Sitting, Cuff Size: Normal)   Pulse 68   Temp 98.1 F (36.7 C) (Oral)   Resp 16   Ht 5\' 4"  (1.626 m)   Wt 147 lb (66.7 kg)   SpO2 95%   BMI 25.23 kg/m   BP Readings from Last 3 Encounters:  09/06/17 130/70  03/08/17 130/70  09/01/16 130/78    Wt Readings from Last 3 Encounters:  09/06/17 147 lb (66.7 kg)  03/08/17 153 lb 4 oz (69.5 kg)  09/01/16 163 lb 1.3 oz (74 kg)    Physical Exam  Constitutional: She is oriented to person, place, and time. No distress.  HENT:  Mouth/Throat: Oropharynx is clear and moist. No oropharyngeal exudate.  Eyes: Conjunctivae are normal. Left eye exhibits no discharge. No scleral icterus.  Neck: Normal range of motion. Neck supple. No JVD present. No thyromegaly present.  Cardiovascular: Normal  rate, regular rhythm, normal heart sounds and intact distal pulses. Exam reveals no gallop and no friction rub.  No murmur heard. Pulmonary/Chest: Effort normal and breath sounds normal. No stridor. No respiratory distress. She has no wheezes. She has no rales. She exhibits no tenderness.  Abdominal: Soft. Bowel sounds are normal. She exhibits no mass. There is no tenderness. There is no guarding. No hernia.  Musculoskeletal: Normal range of motion. She exhibits no edema, tenderness or deformity.  Lymphadenopathy:    She has no cervical adenopathy.  Neurological: She is alert and oriented to person, place, and time.  Skin: Skin is warm and dry. She is not diaphoretic.  Psychiatric: She has a normal mood and affect. Her behavior is normal. Judgment and thought content normal.  Vitals reviewed.   Lab Results  Component Value Date   WBC 8.7 09/06/2017   HGB 13.7 09/06/2017   HCT 41.4 09/06/2017   PLT 266.0 09/06/2017   GLUCOSE 102 (H) 09/06/2017   CHOL 160 09/06/2017   TRIG 99.0 09/06/2017   HDL 49.10 09/06/2017   LDLDIRECT 102.0 07/29/2015   LDLCALC 91 09/06/2017   ALT 10 09/06/2017   AST 13 09/06/2017   NA 139 09/06/2017   K 4.6 09/06/2017   CL 104 09/06/2017   CREATININE 0.81 09/06/2017   BUN 12 09/06/2017   CO2 27 09/06/2017   TSH 3.68 09/14/2013   HGBA1C 6.3 03/08/2017   MICROALBUR 88.8 (H) 03/08/2017    Dg Bone Density  Result Date: 03/14/2017 Date of study: 03/08/17 Exam: DUAL X-RAY ABSORPTIOMETRY (DXA) FOR BONE MINERAL DENSITY (BMD) Instrument: [employer] Provider: PCP Indication: follow up for low BMD Comparison: none (please note that it is not possible to compare data from different instruments) Clinical data: Pt is a 83 y.o. female with  previous history of fracture. On calcium and vitamin D. Results:  Lumbar spine L1-L4 Femoral neck (FN) 33% distal radius T-score 0.2 RFN: -0.9 LFN: -1.9 n/a Change in BMD from previous DXA test (%) n/a n/a n/a (*)  statistically significant Assessment: the BMD is low according to the [facility] classification for osteoporosis (see below). Fracture risk: moderate FRAX score: 10 year major osteoporotic risk: 22.8%. 10 year hip fracture risk: 5.6%. The thresholds for treatment are 20% and 3%, respectively. Comments: the technical quality of the study is good. Evaluation for secondary causes should be considered if clinically indicated. Recommend optimizing calcium (1200 mg/day) and vitamin D (800 IU/day) intake. Followup: Repeat BMD is appropriate after 2 years or after 1-2 years if starting treatment. WHO criteria for diagnosis of osteoporosis in postmenopausal women and in men 64 y/o or older: - normal: T-score -1.0 to + 1.0 - osteopenia/low bone density: T-score between -2.5 and -1.0 - osteoporosis: T-score below -2.5 - severe osteoporosis: T-score below -2.5 with history of fragility fracture Note: although not part of the WHO classification, the presence of a fragility fracture, regardless of the T-score, should be considered  diagnostic of osteoporosis, provided other causes for the fracture have been excluded. Treatment: The National Osteoporosis Foundation recommends that treatment be considered in postmenopausal women and men age 42 or older with: 1. Hip or vertebral (clinical or morphometric) fracture 2. T-score of - 2.5 or lower at the spine or hip 3. 10-year fracture probability by FRAX of at least 20% for a major osteoporotic fracture and 3% for a hip fracture Nichole Pardon MD    Assessment & Plan:   Nichole Silva was seen today for hypertension, hyperlipidemia and diabetes.  Diagnoses and all orders for this visit:  Atherosclerosis of native coronary artery of native heart without angina pectoris- She denies any recent episodes of angina.  Will continue primary prevention and risk factor modification. -     Lipid panel; Future  Essential hypertension- Her blood pressure is well controlled.  Electrolytes and renal function  are normal. -     Comprehensive metabolic panel; Future -     CBC with Differential/Platelet; Future -     Thyroid Panel With TSH; Future -     Urinalysis, Routine w reflex microscopic; Future  Type 2 diabetes mellitus with complication, without long-term current use of insulin (Nichole Silva)- Her A1c is at 6.3%.  Her blood sugars are adequately well controlled. -     Comprehensive metabolic panel; Future -     Microalbumin / creatinine urine ratio; Future  Age-related osteoporosis with current pathological fracture, sequela -     VITAMIN D 25 Hydroxy (Vit-D Deficiency, Fractures); Future  Hyperlipidemia with target LDL less than 100- She has achieved her LDL goal and is doing well on the statin. -     Comprehensive metabolic panel; Future -     Thyroid Panel With TSH; Future  Vitamin D deficiency- Her vitamin D level is in the normal range.  Will continue the current vitamin D supplement. -     VITAMIN D 25 Hydroxy (Vit-D Deficiency, Fractures); Future   I am having Nichole Silva maintain her aspirin, cetirizine, acetaminophen, PROAIR HFA, fluocinonide-emollient, BREO ELLIPTA, furosemide, atorvastatin, fluticasone furoate-vilanterol, Vitamin D (Ergocalciferol), ALPRAZolam, NIFEdipine, losartan, dicyclomine, and fluticasone.  No orders of the defined types were placed in this encounter.    Follow-up: Return in about 6 months (around 03/08/2018).  Scarlette Calico, MD

## 2017-09-07 NOTE — Telephone Encounter (Signed)
Left msg on daughters vm for pt to call back regarding referral - pt needs to call [phone] Unable to leave msg on pt's vm

## 2017-09-07 NOTE — Telephone Encounter (Signed)
Referral entered as requested.  

## 2017-09-24 DIAGNOSIS — N390 Urinary tract infection, site not specified: Secondary | ICD-10-CM | POA: Diagnosis not present

## 2017-09-24 DIAGNOSIS — R339 Retention of urine, unspecified: Secondary | ICD-10-CM | POA: Diagnosis not present

## 2017-10-02 ENCOUNTER — Other Ambulatory Visit: Payer: Self-pay | Admitting: Internal Medicine

## 2017-10-03 ENCOUNTER — Other Ambulatory Visit: Payer: Self-pay | Admitting: Internal Medicine

## 2017-10-04 NOTE — Telephone Encounter (Signed)
[place] controlled substance database checked.  Ok to fill medication. rx sent 

## 2017-10-04 NOTE — Telephone Encounter (Signed)
Can you advise in PCP absence.  

## 2017-10-05 DIAGNOSIS — N3592 Unspecified urethral stricture, female: Secondary | ICD-10-CM | POA: Diagnosis not present

## 2017-10-22 DIAGNOSIS — R828 Abnormal findings on cytological and histological examination of urine: Secondary | ICD-10-CM | POA: Diagnosis not present

## 2017-10-22 DIAGNOSIS — D414 Neoplasm of uncertain behavior of bladder: Secondary | ICD-10-CM | POA: Diagnosis not present

## 2017-10-24 ENCOUNTER — Other Ambulatory Visit: Payer: Self-pay | Admitting: Internal Medicine

## 2017-10-24 DIAGNOSIS — J4540 Moderate persistent asthma, uncomplicated: Secondary | ICD-10-CM

## 2017-11-08 ENCOUNTER — Other Ambulatory Visit: Payer: Self-pay | Admitting: Internal Medicine

## 2017-11-09 ENCOUNTER — Telehealth: Payer: Self-pay | Admitting: Internal Medicine

## 2017-11-09 NOTE — Telephone Encounter (Signed)
Patient has dropped off a form to be completed for her [employer]. She states they are unable to bring the trash cans down to the road. If this form is completed by provider, they will come get the trash cans from the house. Form has been placed in providers box to approve and sign.    Daughter Nunzio Cory will pick up the form.

## 2017-11-10 NOTE — Telephone Encounter (Signed)
Daughter informed the form is ready to be picked up.   Copy sent to scan.

## 2017-11-11 ENCOUNTER — Ambulatory Visit: Payer: Medicare Other

## 2017-11-26 DIAGNOSIS — R339 Retention of urine, unspecified: Secondary | ICD-10-CM | POA: Diagnosis not present

## 2017-11-30 ENCOUNTER — Other Ambulatory Visit: Payer: Self-pay | Admitting: Internal Medicine

## 2017-12-01 NOTE — Telephone Encounter (Signed)
LOV w/you: 09/06/17 No existing w/you Ok to Rf?

## 2017-12-22 ENCOUNTER — Other Ambulatory Visit: Payer: Self-pay | Admitting: Internal Medicine

## 2017-12-22 DIAGNOSIS — IMO0002 Reserved for concepts with insufficient information to code with codable children: Secondary | ICD-10-CM

## 2017-12-22 DIAGNOSIS — I10 Essential (primary) hypertension: Secondary | ICD-10-CM

## 2017-12-22 DIAGNOSIS — E1165 Type 2 diabetes mellitus with hyperglycemia: Secondary | ICD-10-CM

## 2018-01-04 DIAGNOSIS — N819 Female genital prolapse, unspecified: Secondary | ICD-10-CM | POA: Diagnosis not present

## 2018-03-04 DIAGNOSIS — N3592 Unspecified urethral stricture, female: Secondary | ICD-10-CM | POA: Diagnosis not present

## 2018-03-04 DIAGNOSIS — N390 Urinary tract infection, site not specified: Secondary | ICD-10-CM | POA: Diagnosis not present

## 2018-03-08 ENCOUNTER — Ambulatory Visit (INDEPENDENT_AMBULATORY_CARE_PROVIDER_SITE_OTHER): Payer: Medicare Other | Admitting: Internal Medicine

## 2018-03-08 ENCOUNTER — Encounter: Payer: Self-pay | Admitting: Internal Medicine

## 2018-03-08 VITALS — BP 130/80 | HR 76 | Temp 98.1°F | Resp 16 | Ht 64.0 in | Wt 146.2 lb

## 2018-03-08 DIAGNOSIS — E118 Type 2 diabetes mellitus with unspecified complications: Secondary | ICD-10-CM | POA: Diagnosis not present

## 2018-03-08 DIAGNOSIS — Z23 Encounter for immunization: Secondary | ICD-10-CM

## 2018-03-08 DIAGNOSIS — I10 Essential (primary) hypertension: Secondary | ICD-10-CM

## 2018-03-08 LAB — POCT GLYCOSYLATED HEMOGLOBIN (HGB A1C): HEMOGLOBIN A1C: 5.7 % — AB (ref 4.0–5.6)

## 2018-03-08 NOTE — Patient Instructions (Signed)

## 2018-03-08 NOTE — Progress Notes (Signed)
Subjective:  Patient ID: Nichole Silva, female    DOB: [birth year]  Age: 83 y.o. MRN: [id number]  CC: Hypertension and Diabetes   HPI Nichole Silva presents for f/up - She tells me her blood pressures and blood sugars have been well controlled.  She feels well today and offers no complaints.  Outpatient Medications Prior to Visit  Medication Sig Dispense Refill  . acetaminophen (TYLENOL) 325 MG tablet Take 325 mg by mouth every 6 (six) hours as needed for mild pain or moderate pain. Pain/headache    . ALPRAZolam (XANAX) 0.5 MG tablet TAKE 1/2 TO 1 TABLET BY MOUTH 3 TIMES A DAY AS NEEDED 90 tablet 3  . aspirin 81 MG tablet Take 81 mg by mouth daily.      [place] Kitchen atorvastatin (LIPITOR) 40 MG tablet TAKE ONE TABLET BY MOUTH ONCE DAILY 90 tablet 1  . BREO ELLIPTA 200-25 MCG/INH AEPB INHALE 1 PUFF INTO THE LUNGS DAILY 90 each 1  . cetirizine (ZYRTEC) 10 MG tablet Take 10 mg by mouth daily.      [place] Kitchen dicyclomine (BENTYL) 10 MG capsule TAKE ONE CAPSULE BY MOUTH 3 TIMES A DAY BEFORE MEALS 90 capsule 6  . fluocinonide-emollient (LIDEX-E) 0.05 % cream Apply 1 application topically 2 (two) times daily. 60 g 2  . fluticasone (FLONASE) 50 MCG/ACT nasal spray USE 2 SPRAYS IN EACH NOSTRIL AT BEDTIME 48 g 1  . fluticasone furoate-vilanterol (BREO ELLIPTA) 200-25 MCG/INH AEPB Breo Ellipta 200 mcg-25 mcg/dose powder for inhalation    . furosemide (LASIX) 40 MG tablet Take 1-2 tablets (40-80 mg total) by mouth daily. 180 tablet 1  . losartan (COZAAR) 50 MG tablet Take 1 tablet (50 mg total) by mouth daily. 90 tablet 1  . NIFEdipine (PROCARDIA XL/ADALAT-CC) 60 MG 24 hr tablet Take 1 tablet (60 mg total) by mouth daily. 90 tablet 1  . PROAIR HFA 108 (90 Base) MCG/ACT inhaler INHALE 1 TO 2 PUFFS BY MOUTH EVERY 4 HOURS AS NEEDED FOR WHEEZE OR SHORTNESS OF BREATH 8.5 Inhaler 11  . Vitamin D, Ergocalciferol, (DRISDOL) 50000 units CAPS capsule TAKE ONE CAPSULE BY MOUTH EVERY MONDAY 12 capsule 1  . ciprofloxacin (CIPRO) 500 MG  tablet      No facility-administered medications prior to visit.     ROS Review of Systems  Constitutional: Negative for appetite change, diaphoresis, fatigue and unexpected weight change.  HENT: Negative.   Eyes: Negative for visual disturbance.  Respiratory: Negative for cough, chest tightness, shortness of breath and wheezing.   Cardiovascular: Negative for chest pain, palpitations and leg swelling.  Gastrointestinal: Negative for abdominal pain, constipation, diarrhea, nausea and vomiting.  Endocrine: Negative.   Genitourinary: Negative.  Negative for difficulty urinating.  Musculoskeletal: Negative.  Negative for arthralgias and myalgias.  Skin: Negative.  Negative for color change.  Neurological: Negative.  Negative for dizziness, weakness and light-headedness.  Hematological: Negative for adenopathy. Does not bruise/bleed easily.  Psychiatric/Behavioral: Negative.     Objective:  BP 130/80 (BP Location: Left Arm, Patient Position: Sitting, Cuff Size: Normal)   Pulse 76   Temp 98.1 F (36.7 C) (Oral)   Resp 16   Ht 5\' 4"  (1.626 m)   Wt 146 lb 4 oz (66.3 kg)   SpO2 97%   BMI 25.10 kg/m   BP Readings from Last 3 Encounters:  03/08/18 130/80  09/06/17 130/70  03/08/17 130/70    Wt Readings from Last 3 Encounters:  03/08/18 146 lb 4 oz (66.3 kg)  09/06/17 147  lb (66.7 kg)  03/08/17 153 lb 4 oz (69.5 kg)    Physical Exam  Constitutional: She is oriented to person, place, and time. No distress.  HENT:  Mouth/Throat: Oropharynx is clear and moist. No oropharyngeal exudate.  Eyes: Conjunctivae are normal. No scleral icterus.  Neck: Normal range of motion. Neck supple. No JVD present. No thyromegaly present.  Cardiovascular: Normal rate, regular rhythm and normal heart sounds. Exam reveals no gallop.  No murmur heard. Pulmonary/Chest: Effort normal and breath sounds normal. No respiratory distress. She has no wheezes. She has no rhonchi. She has no rales.    Abdominal: Soft. Bowel sounds are normal. She exhibits no mass. There is no tenderness.  Musculoskeletal: Normal range of motion. She exhibits no edema, tenderness or deformity.  Lymphadenopathy:    She has no cervical adenopathy.  Neurological: She is alert and oriented to person, place, and time.  Skin: Skin is warm and dry. No rash noted. She is not diaphoretic. No erythema. No pallor.  Vitals reviewed.   Lab Results  Component Value Date   WBC 8.7 09/06/2017   HGB 13.7 09/06/2017   HCT 41.4 09/06/2017   PLT 266.0 09/06/2017   GLUCOSE 102 (H) 09/06/2017   CHOL 160 09/06/2017   TRIG 99.0 09/06/2017   HDL 49.10 09/06/2017   LDLDIRECT 102.0 07/29/2015   LDLCALC 91 09/06/2017   ALT 10 09/06/2017   AST 13 09/06/2017   NA 139 09/06/2017   K 4.6 09/06/2017   CL 104 09/06/2017   CREATININE 0.81 09/06/2017   BUN 12 09/06/2017   CO2 27 09/06/2017   TSH 1.06 09/06/2017   HGBA1C 5.7 (A) 03/08/2018   MICROALBUR 11.6 (H) 09/06/2017    Dg Bone Density  Result Date: 03/14/2017 Date of study: 03/08/17 Exam: DUAL X-RAY ABSORPTIOMETRY (DXA) FOR BONE MINERAL DENSITY (BMD) Instrument: [employer] Provider: PCP Indication: follow up for low BMD Comparison: none (please note that it is not possible to compare data from different instruments) Clinical data: Pt is a 83 y.o. female with  previous history of fracture. On calcium and vitamin D. Results:  Lumbar spine L1-L4 Femoral neck (FN) 33% distal radius T-score 0.2 RFN: -0.9 LFN: -1.9 n/a Change in BMD from previous DXA test (%) n/a n/a n/a (*) statistically significant Assessment: the BMD is low according to the [facility] classification for osteoporosis (see below). Fracture risk: moderate FRAX score: 10 year major osteoporotic risk: 22.8%. 10 year hip fracture risk: 5.6%. The thresholds for treatment are 20% and 3%, respectively. Comments: the technical quality of the study is good. Evaluation for secondary causes should be  considered if clinically indicated. Recommend optimizing calcium (1200 mg/day) and vitamin D (800 IU/day) intake. Followup: Repeat BMD is appropriate after 2 years or after 1-2 years if starting treatment. WHO criteria for diagnosis of osteoporosis in postmenopausal women and in men 66 y/o or older: - normal: T-score -1.0 to + 1.0 - osteopenia/low bone density: T-score between -2.5 and -1.0 - osteoporosis: T-score below -2.5 - severe osteoporosis: T-score below -2.5 with history of fragility fracture Note: although not part of the WHO classification, the presence of a fragility fracture, regardless of the T-score, should be considered diagnostic of osteoporosis, provided other causes for the fracture have been excluded. Treatment: The National Osteoporosis Foundation recommends that treatment be considered in postmenopausal women and men age 30 or older with: 1. Hip or vertebral (clinical or morphometric) fracture 2. T-score of - 2.5 or lower at the spine or  hip 3. 10-year fracture probability by FRAX of at least 20% for a major osteoporotic fracture and 3% for a hip fracture Loura Pardon MD    Assessment & Plan:   Kenlee was seen today for hypertension and diabetes.  Diagnoses and all orders for this visit:  Need for influenza vaccination -     Flu vaccine HIGH DOSE PF (Fluzone High dose)  Essential hypertension- Her blood pressure is well controlled.  Recent electrolytes and renal function are normal.  Type II diabetes mellitus with manifestations ([place])- Her blood sugars are very well controlled.  Medical therapy is not indicated. -     POCT glycosylated hemoglobin (Hb A1C)   I have discontinued Yenny Misenheimer's ciprofloxacin. I am also having her maintain her aspirin, cetirizine, acetaminophen, PROAIR HFA, fluocinonide-emollient, furosemide, fluticasone furoate-vilanterol, dicyclomine, fluticasone, atorvastatin, BREO ELLIPTA, Vitamin D (Ergocalciferol), ALPRAZolam, losartan, and NIFEdipine.  No  orders of the defined types were placed in this encounter.    Follow-up: Return in about 6 months (around 09/07/2018).  Scarlette Calico, MD

## 2018-03-20 ENCOUNTER — Other Ambulatory Visit: Payer: Self-pay | Admitting: Internal Medicine

## 2018-03-25 ENCOUNTER — Other Ambulatory Visit: Payer: Self-pay | Admitting: Internal Medicine

## 2018-03-30 ENCOUNTER — Ambulatory Visit (INDEPENDENT_AMBULATORY_CARE_PROVIDER_SITE_OTHER): Payer: Medicare Other

## 2018-03-30 ENCOUNTER — Encounter (HOSPITAL_COMMUNITY): Payer: Self-pay | Admitting: Emergency Medicine

## 2018-03-30 ENCOUNTER — Ambulatory Visit (HOSPITAL_COMMUNITY)
Admission: EM | Admit: 2018-03-30 | Discharge: 2018-03-30 | Disposition: A | Discharge status: Home / Self Care | Payer: Medicare Other | Attending: Family Medicine | Admitting: Family Medicine

## 2018-03-30 DIAGNOSIS — S42211A Unspecified displaced fracture of surgical neck of right humerus, initial encounter for closed fracture: Secondary | ICD-10-CM

## 2018-03-30 DIAGNOSIS — S42291A Other displaced fracture of upper end of right humerus, initial encounter for closed fracture: Secondary | ICD-10-CM | POA: Diagnosis not present

## 2018-03-30 IMAGING — DX DG HUMERUS 2V *R*
2 series · 2 of 2 positions shown · non-contrast
Comparison: None.

CLINICAL DATA: Patient fell last night injuring the right shoulder
and arm. The patient reports pain with lifting and lowering the
right arm.

EXAM:
RIGHT HUMERUS - 2+ VIEW

[humerus ap]
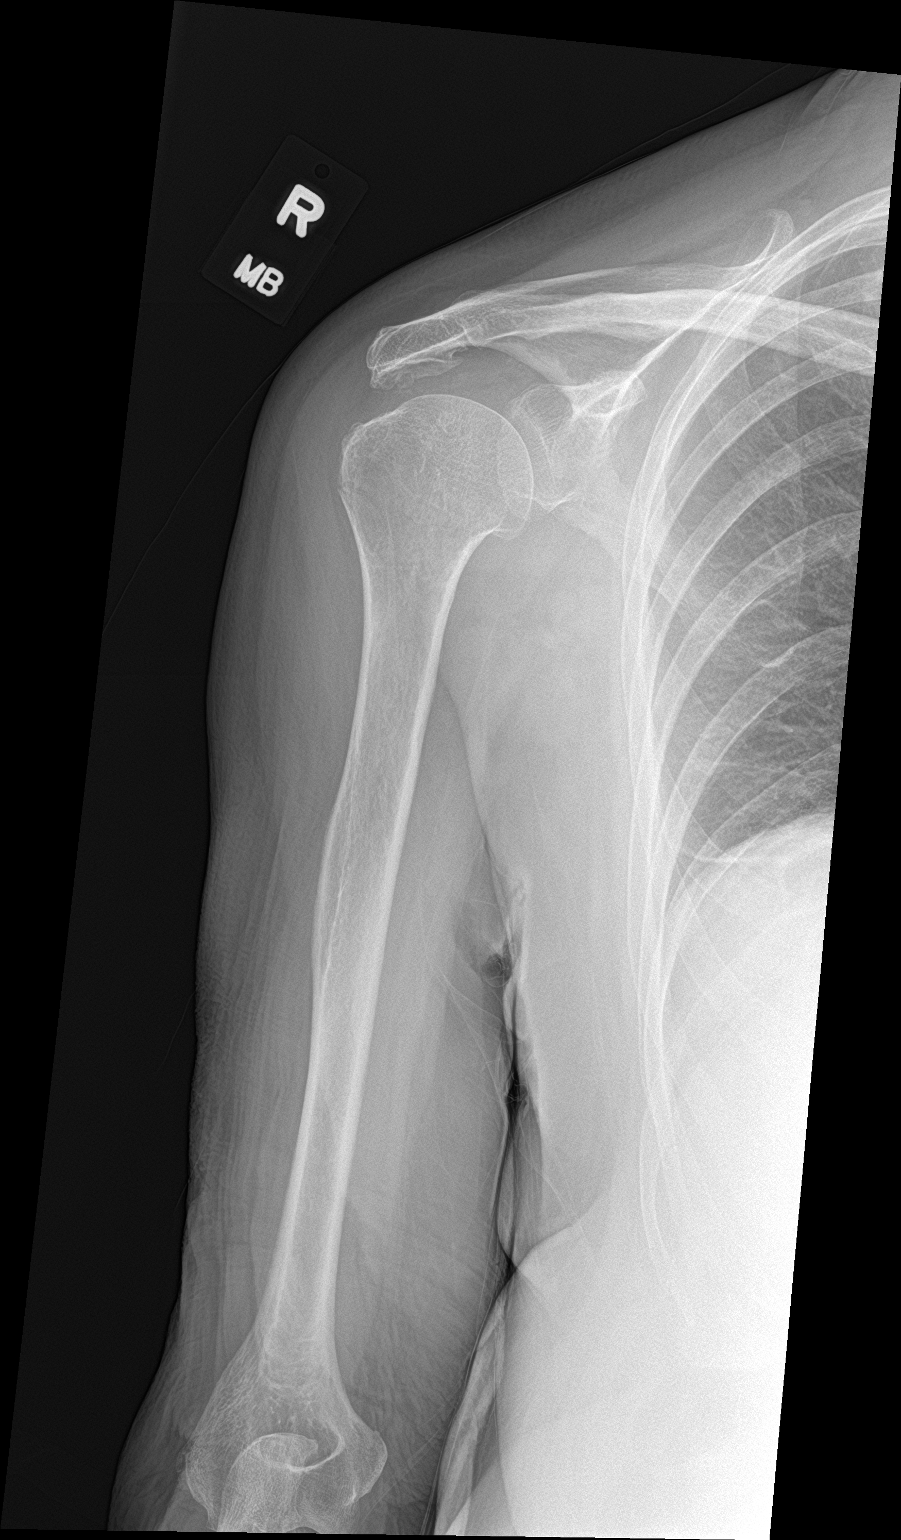

[humerus lat]
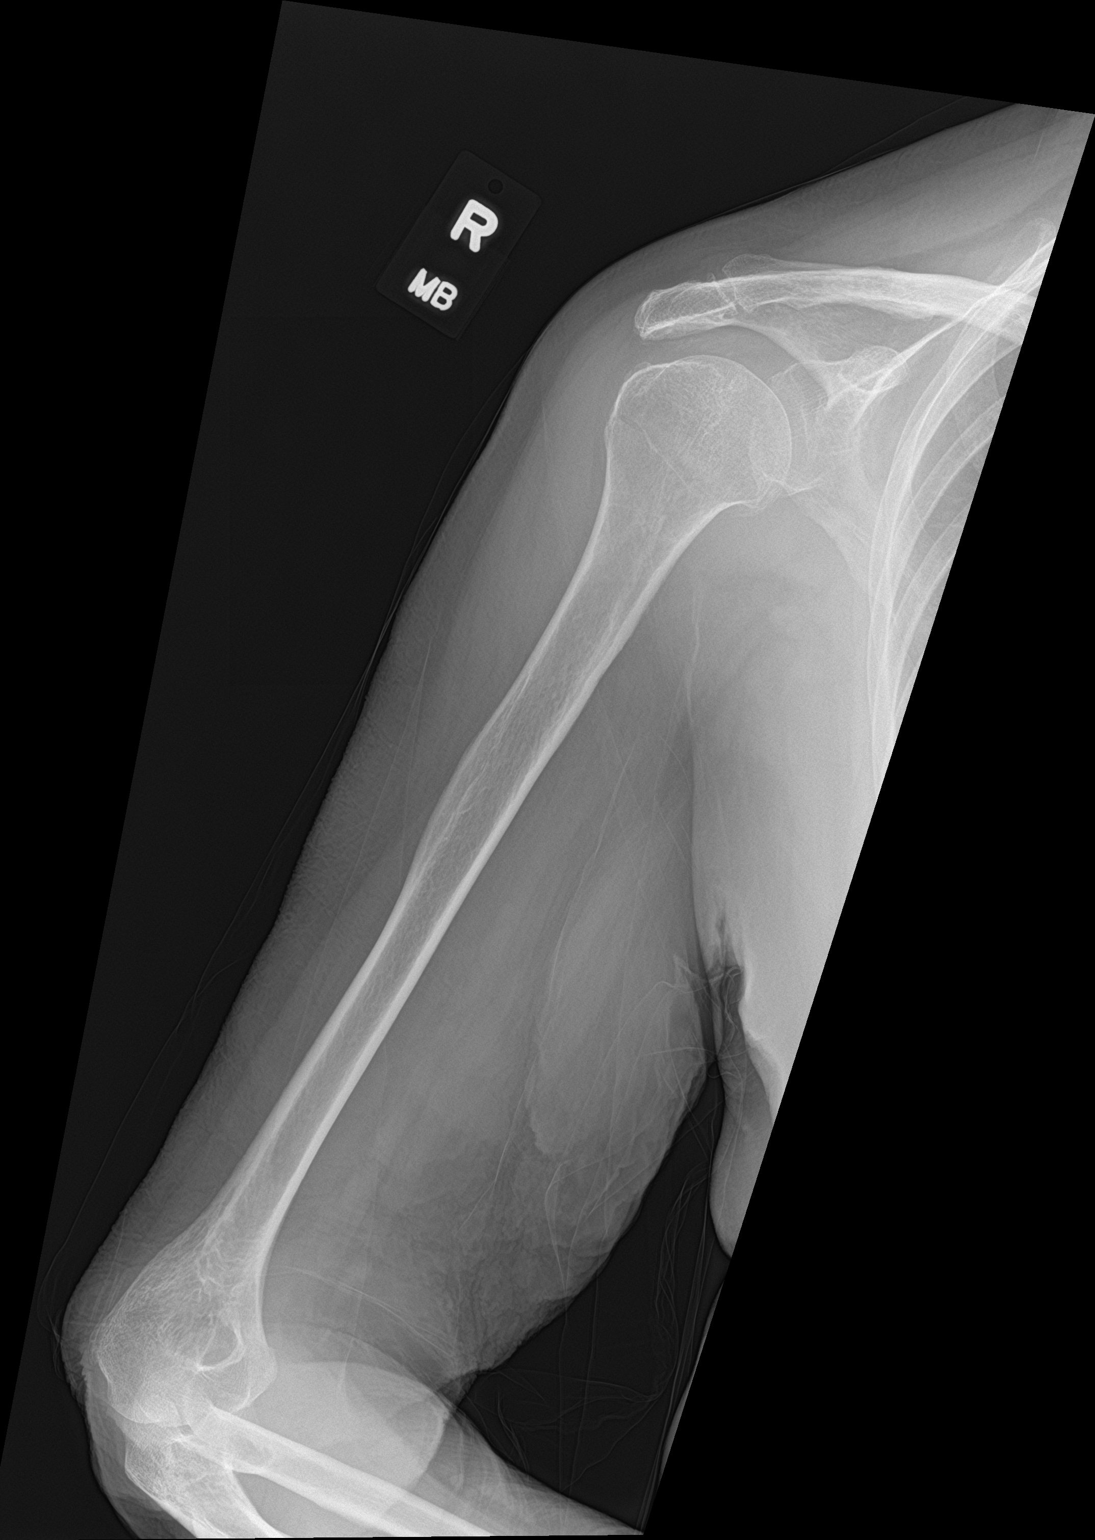

[2 of 2 positions shown; findings below may reference images not displayed]

FINDINGS: The patient has sustained a minimally distracted fracture through
the neck of the right humerus. The humeral head remains normally
positioned with respect to the bony glenoid. The humeral shaft and
condylar and supracondylar portions of the distal humerus appear
normal. The observed portions of the right scapula as well as of the
right clavicle are intact. The visualized right lateral ribs exhibit
no acute abnormalities.
IMPRESSION: Acute minimally distracted fracture through the neck of the right
humerus. These results will be called to the ordering clinician or
representative by the Radiologist Assistant, and communication
documented in the PACS or zVision Dashboard.

## 2018-03-30 NOTE — ED Triage Notes (Signed)
Pt here after fall last night; pt sts right arm pain in upper part

## 2018-03-30 NOTE — ED Provider Notes (Signed)
Nichole Silva   [id number] 03/30/18 Arrival Time: 1232  ASSESSMENT & PLAN:  1. Fracture of humerus neck, right, closed, initial encounter    I have personally viewed the imaging studies ordered this visit. Proximal humerus fracture noted.  Imaging: Dg Humerus Right  Result Date: 03/30/2018 CLINICAL DATA:  Patient fell last night injuring the right shoulder and arm. The patient reports pain with lifting and lowering the right arm. EXAM: RIGHT HUMERUS - 2+ VIEW COMPARISON:  None. FINDINGS: The patient has sustained a minimally distracted fracture through the neck of the right humerus. The humeral head remains normally positioned with respect to the bony glenoid. The humeral shaft and condylar and supracondylar portions of the distal humerus appear normal. The observed portions of the right scapula as well as of the right clavicle are intact. The visualized right lateral ribs exhibit no acute abnormalities. IMPRESSION: Acute minimally distracted fracture through the neck of the right humerus. These results will be called to the ordering clinician or representative by the Radiologist Assistant, and communication documented in the PACS or zVision Dashboard. Electronically Signed   By: David  Martinique M.D.   On: 03/30/2018 13:55   No neuro/vascular RUE compromise.  Follow-up Information    Schedule an appointment as soon as possible for a visit  with Leandrew Koyanagi, MD.   Specialty:  Orthopedic Surgery Contact information: [place] [address] [phone]          Orders Placed This Encounter  Procedures  . Sling immobilizer    Standing Status:   Standing    Number of Occurrences:   1    Order Specific Question:   Laterality    Answer:   Right   OTC analgesics as needed. She declines pain medication. To arrange orthopaedic follow up as soon as possible. She will schedule or go through her PCP. May f/u here as needed.  Reviewed expectations re:  course of current medical issues. Questions answered. Outlined signs and symptoms indicating need for more acute intervention. Patient verbalized understanding. After Visit Summary given.  SUBJECTIVE: History from: patient. Nichole Silva is a 83 y.o. female who reports persistent pain of her right upper arm/shoulder without swelling. Onset abrupt beginning yesterday evening. Injury/trama: yes, reports falling; questions landing on R arm. Discomfort described as aching without radiation. Worse with any RUE movement at shoulder. Better with holding RUE still. No CP/SOB. No neck pain. Extremity sensation changes or weakness: none. Self treatment: Tylenol with some help. No head injury reported.  ROS: As per HPI. All other systems negative.   OBJECTIVE:  Vitals:   03/30/18 1319  BP: (!) 150/93  Pulse: 82  Resp: 18  Temp: 98 F (36.7 C)  TempSrc: Oral  SpO2: 100%    General appearance: alert; no distress Neck: supple with FROM; no midline tenderness Extremities: no cyanosis or edema; symmetrical with no gross deformities; poorly localized tenderness over her right proximal humerus with no swelling and mild bruising; ROM: limited by pain; normal RUE elbow ROM; normal extremity capillary refill and radial pulse of RUE CV: RRR Lungs: unlabored respirations; CTAB Chest wall: without tenderness Abd: soft; nontender Skin: warm and dry Neurologic: gait normal; normal symmetric reflexes of RUE and LUE; normal sensation of RUE and LUE Psychological: alert and cooperative; normal mood and affect   Allergies  Allergen Reactions  . Codeine     Unknown per pt   . Metformin And Related     N&V  . Penicillins  REACTION: ITCHING AND SWELLING    Past Medical History:  Diagnosis Date  . Allergic rhinitis, cause unspecified   . Anxiety state, unspecified   . Asthma   . Coronary atherosclerosis of unspecified type of vessel, native or graft   . Depression   . Hyperlipidemia   . IBS  (irritable bowel syndrome)   . Osteoarthrosis, unspecified whether generalized or localized, unspecified site   . Type II or unspecified type diabetes mellitus without mention of complication, not stated as uncontrolled   . Unspecified chronic bronchitis ([place])   . Unspecified essential hypertension   . Unspecified hearing loss   . Unspecified venous (peripheral) insufficiency   . Unspecified vitamin D deficiency    Social History   Socioeconomic History  . Marital status: Married    Spouse name: Nichole Silva  . Number of children: 3  . Years of education: Not on file  . Highest education level: Not on file  Occupational History    Employer: [employer]  Social Needs  . Financial resource strain: Not on file  . Food insecurity:    Worry: Not on file    Inability: Not on file  . Transportation needs:    Medical: Not on file    Non-medical: Not on file  Tobacco Use  . Smoking status: Never Smoker  . Smokeless tobacco: Never Used  Substance and Sexual Activity  . Alcohol use: No    Alcohol/week: 0.0 standard drinks  . Drug use: No  . Sexual activity: Not on file  Lifestyle  . Physical activity:    Days per week: Not on file    Minutes per session: Not on file  . Stress: Not on file  Relationships  . Social connections:    Talks on phone: Not on file    Gets together: Not on file    Attends religious service: Not on file    Active member of club or organization: Not on file    Attends meetings of clubs or organizations: Not on file    Relationship status: Not on file  . Intimate partner violence:    Fear of current or ex partner: Not on file    Emotionally abused: Not on file    Physically abused: Not on file    Forced sexual activity: Not on file  Other Topics Concern  . Not on file  Social History Narrative  . Not on file   Family History  Problem Relation Age of Onset  . Emphysema Mother   . Heart disease Father   . Colon cancer Sister   .  Colon cancer Unknown   . Pancreatic cancer Unknown    Past Surgical History:  Procedure Laterality Date  . Cataract surg  2009  . CHOLECYSTECTOMY  1997  . DILATION AND CURETTAGE OF UTERUS  1960's  . Left elbow surgery  7001      Vanessa Kick, MD 04/02/18 1028

## 2018-03-30 NOTE — Discharge Instructions (Addendum)
You may use over the counter ibuprofen or acetaminophen as needed to help with any discomfort.

## 2018-03-31 ENCOUNTER — Other Ambulatory Visit (INDEPENDENT_AMBULATORY_CARE_PROVIDER_SITE_OTHER): Payer: Self-pay

## 2018-03-31 ENCOUNTER — Ambulatory Visit (INDEPENDENT_AMBULATORY_CARE_PROVIDER_SITE_OTHER): Payer: Medicare Other | Admitting: Orthopaedic Surgery

## 2018-03-31 ENCOUNTER — Encounter (INDEPENDENT_AMBULATORY_CARE_PROVIDER_SITE_OTHER): Payer: Self-pay | Admitting: Orthopaedic Surgery

## 2018-03-31 DIAGNOSIS — S42211A Unspecified displaced fracture of surgical neck of right humerus, initial encounter for closed fracture: Secondary | ICD-10-CM | POA: Diagnosis not present

## 2018-03-31 NOTE — Progress Notes (Signed)
Office Visit Note   Patient: Nichole Silva           Date of Birth: 1935-05-24           MRN: [id number] Visit Date: 03/31/2018              Requested by: Janith Lima, MD [address] [place], [place] [address] PCP: Janith Lima, MD   Assessment & Plan: Visit Diagnoses:  1. Closed fracture of neck of right humerus, initial encounter     Plan: Impression is nondisplaced radial neck fracture on the right.  This should be amenable to conservative treatment.  The patient will continue in a sling for the next 2 weeks.  She will follow-up with [place] in 2 weeks time for repeat evaluation and x-ray.  In the meantime, we will try and get occupational therapy or home health nurse out to her house to help with ADLs.  Her daughter will also look into this through her insurance company.  Follow-Up Instructions: Return in about 2 weeks (around 04/14/2018).   Orders:  No orders of the defined types were placed in this encounter.  No orders of the defined types were placed in this encounter.     Procedures: No procedures performed   Clinical Data: No additional findings.   Subjective: Chief Complaint  Patient presents with  . Right Shoulder - Fracture    HPI patient is a pleasant 83 year old female presents to our clinic today for follow-up of her right shoulder.  Tuesday night, she tripped and fell landing on her right upper extremity.  She was seen at [facility] urgent care x-rays were obtained.  These showed a humeral neck fracture.  She was placed in a sling nonweightbearing.  She has been doing well with this.  She has been taking Tylenol with moderate relief of symptoms.  She has been having trouble not using her right arm due the lack of strength to the left upper extremity from a previous injury.  Review of Systems as detailed in HPI.  All others reviewed and are negative.   Objective: Vital Signs: There were no vitals taken for this visit.  Physical Exam  well-developed well-nourished female no acute distress.  Alert and oriented x3.  Ortho Exam examination of her right upper extremity reveals moderate tenderness to the proximal humerus.  Full sensation distally.  Specialty Comments:  No specialty comments available.  Imaging: Dg Humerus Right  Result Date: 03/30/2018 CLINICAL DATA:  Patient fell last night injuring the right shoulder and arm. The patient reports pain with lifting and lowering the right arm. EXAM: RIGHT HUMERUS - 2+ VIEW COMPARISON:  None. FINDINGS: The patient has sustained a minimally distracted fracture through the neck of the right humerus. The humeral head remains normally positioned with respect to the bony glenoid. The humeral shaft and condylar and supracondylar portions of the distal humerus appear normal. The observed portions of the right scapula as well as of the right clavicle are intact. The visualized right lateral ribs exhibit no acute abnormalities. IMPRESSION: Acute minimally distracted fracture through the neck of the right humerus. These results will be called to the ordering clinician or representative by the Radiologist Assistant, and communication documented in the PACS or zVision Dashboard. Electronically Signed   By: David  Martinique M.D.   On: 03/30/2018 13:55     PMFS History: Patient Active Problem List   Diagnosis Date Noted  . Closed fracture of neck of right humerus 03/31/2018  .  Estrogen deficiency 03/08/2017  . Age-related osteoporosis with current pathological fracture 03/08/2017  . Eczema 05/28/2016  . Asthma, moderate persistent 07/29/2015  . IBS (irritable bowel syndrome) 01/30/2015  . Pessary maintenance 10/25/2013  . Cystocele with uterine prolapse 10/25/2013  . Anxiety 10/06/2013  . Routine general medical examination at a health care facility 09/14/2013  . Vitamin D deficiency 02/25/2009  . DEGENERATIVE JOINT DISEASE 02/27/2008  . Type II diabetes mellitus with manifestations ([place])  09/02/2007  . Hyperlipidemia with target LDL less than 100 07/05/2007  . Essential hypertension 07/05/2007  . Coronary atherosclerosis 07/05/2007   Past Medical History:  Diagnosis Date  . Allergic rhinitis, cause unspecified   . Anxiety state, unspecified   . Asthma   . Coronary atherosclerosis of unspecified type of vessel, native or graft   . Depression   . Hyperlipidemia   . IBS (irritable bowel syndrome)   . Osteoarthrosis, unspecified whether generalized or localized, unspecified site   . Type II or unspecified type diabetes mellitus without mention of complication, not stated as uncontrolled   . Unspecified chronic bronchitis ([place])   . Unspecified essential hypertension   . Unspecified hearing loss   . Unspecified venous (peripheral) insufficiency   . Unspecified vitamin D deficiency     Family History  Problem Relation Age of Onset  . Emphysema Mother   . Heart disease Father   . Colon cancer Sister   . Colon cancer Unknown   . Pancreatic cancer Unknown     Past Surgical History:  Procedure Laterality Date  . Cataract surg  2009  . CHOLECYSTECTOMY  1997  . DILATION AND CURETTAGE OF UTERUS  1960's  . Left elbow surgery  1992   Social History   Occupational History    Employer: [employer]  Tobacco Use  . Smoking status: Never Smoker  . Smokeless tobacco: Never Used  Substance and Sexual Activity  . Alcohol use: No    Alcohol/week: 0.0 standard drinks  . Drug use: No  . Sexual activity: Not on file

## 2018-04-01 ENCOUNTER — Other Ambulatory Visit: Payer: Self-pay | Admitting: Internal Medicine

## 2018-04-01 ENCOUNTER — Telehealth (INDEPENDENT_AMBULATORY_CARE_PROVIDER_SITE_OTHER): Payer: Self-pay

## 2018-04-01 NOTE — Telephone Encounter (Signed)
Patients daughter called on behalf of patient because she is patient Healthcare POA and patient is hard of hearing. They were concerned because patient was seen by Mendel Ryder on 11/7 for humerus fx and patients pain has gotten some worse since she was seen. They questioned what they should do if pain increased. Patient not wanting to take more than tylenol for pain. Advised always doctor on call for office and also urgent care/ER was option. They just wanted to make sure that they had a plan in the even that she worsened over the weekend.

## 2018-04-05 ENCOUNTER — Telehealth (INDEPENDENT_AMBULATORY_CARE_PROVIDER_SITE_OTHER): Payer: Self-pay | Admitting: Orthopaedic Surgery

## 2018-04-05 NOTE — Telephone Encounter (Signed)
Nichole Silva, from [facility] called stating that he did received your referral but it was not complete and is needing more information.  CB#[phone]

## 2018-04-07 NOTE — Telephone Encounter (Signed)
FAXED TO [facility]

## 2018-04-11 ENCOUNTER — Telehealth (INDEPENDENT_AMBULATORY_CARE_PROVIDER_SITE_OTHER): Payer: Self-pay

## 2018-04-11 NOTE — Telephone Encounter (Signed)
Dondra Spry with [facility] would like to let Dr. Erlinda Hong know that they will be going to see the patient on Tuesday, 04/12/2018 to start PT.

## 2018-04-12 ENCOUNTER — Telehealth (INDEPENDENT_AMBULATORY_CARE_PROVIDER_SITE_OTHER): Payer: Self-pay | Admitting: Orthopaedic Surgery

## 2018-04-12 NOTE — Telephone Encounter (Signed)
FYI ONLY

## 2018-04-12 NOTE — Telephone Encounter (Signed)
[name] from [facility] called requesting VO for a new start of care for tomorrow due to the fact that she was unable to contact the patient.  CB#[phone].

## 2018-04-14 NOTE — Telephone Encounter (Signed)
Called [name] to advise ok on orders. [name] states patient nor daughter has answered phone call. Patient has scheduled appt tomorrow with Dr Erlinda Hong.

## 2018-04-15 ENCOUNTER — Ambulatory Visit (INDEPENDENT_AMBULATORY_CARE_PROVIDER_SITE_OTHER): Payer: Self-pay

## 2018-04-15 ENCOUNTER — Encounter (INDEPENDENT_AMBULATORY_CARE_PROVIDER_SITE_OTHER): Payer: Self-pay | Admitting: Orthopaedic Surgery

## 2018-04-15 ENCOUNTER — Ambulatory Visit (INDEPENDENT_AMBULATORY_CARE_PROVIDER_SITE_OTHER): Payer: Medicare Other | Admitting: Orthopaedic Surgery

## 2018-04-15 VITALS — Ht 64.0 in | Wt 146.2 lb

## 2018-04-15 DIAGNOSIS — S42211A Unspecified displaced fracture of surgical neck of right humerus, initial encounter for closed fracture: Secondary | ICD-10-CM

## 2018-04-15 NOTE — Progress Notes (Signed)
Office Visit Note   Patient: Nichole Silva           Date of Birth: 1934/10/26           MRN: [id number] Visit Date: 04/15/2018              Requested by: Janith Lima, MD [address], [place] [address] PCP: Janith Lima, MD   Assessment & Plan: Visit Diagnoses:  1. Closed fracture of neck of right humerus, initial encounter     Plan: Impression is 2 weeks status post nondisplaced right radial neck fracture.  We will have the patient discontinue the sling at this point.  She will start to work on pendulum swings as well as gentle range of motion as tolerated.  We will try and get home health physical therapy approved for this.  No lifting.  She will follow-up with [place] in 3 weeks time for repeat evaluation and x-ray.  Follow-Up Instructions: Return in about 3 weeks (around 05/06/2018).   Orders:  Orders Placed This Encounter  Procedures  . XR Humerus Right   No orders of the defined types were placed in this encounter.     Procedures: No procedures performed   Clinical Data: No additional findings.   Subjective: Chief Complaint  Patient presents with  . Right Arm - Follow-up, Pain    HPI patient is a pleasant 83 year old female presents to our clinic today for follow-up of her right shoulder.  She is approximately 2 weeks out right humeral neck fracture, date of injury 03/29/2018.  She has been compliant in a sling for the past few weeks.  Minimal pain to the right upper extremity.  Review of Systems as detailed in HPI.  All others reviewed and are negative.   Objective: Vital Signs: Ht 5\' 4"  (1.626 m)   Wt 146 lb 4 oz (66.3 kg)   BMI 25.10 kg/m   Physical Exam well-developed well-nourished female in no acute distress.  Alert and oriented x3.  Ortho Exam examination of the right shoulder reveals minimal tenderness to deep palpation over the humeral neck.  Full sensation distally.  Specialty Comments:  No specialty comments  available.  Imaging: Xr Humerus Right  Result Date: 04/15/2018 X-rays demonstrate a healing nondisplaced radial neck fracture    PMFS History: Patient Active Problem List   Diagnosis Date Noted  . Closed fracture of neck of right humerus 03/31/2018  . Estrogen deficiency 03/08/2017  . Age-related osteoporosis with current pathological fracture 03/08/2017  . Eczema 05/28/2016  . Asthma, moderate persistent 07/29/2015  . IBS (irritable bowel syndrome) 01/30/2015  . Pessary maintenance 10/25/2013  . Cystocele with uterine prolapse 10/25/2013  . Anxiety 10/06/2013  . Routine general medical examination at a health care facility 09/14/2013  . Vitamin D deficiency 02/25/2009  . DEGENERATIVE JOINT DISEASE 02/27/2008  . Type II diabetes mellitus with manifestations ([place]) 09/02/2007  . Hyperlipidemia with target LDL less than 100 07/05/2007  . Essential hypertension 07/05/2007  . Coronary atherosclerosis 07/05/2007   Past Medical History:  Diagnosis Date  . Allergic rhinitis, cause unspecified   . Anxiety state, unspecified   . Asthma   . Coronary atherosclerosis of unspecified type of vessel, native or graft   . Depression   . Hyperlipidemia   . IBS (irritable bowel syndrome)   . Osteoarthrosis, unspecified whether generalized or localized, unspecified site   . Type II or unspecified type diabetes mellitus without mention of complication, not stated as  uncontrolled   . Unspecified chronic bronchitis ([place])   . Unspecified essential hypertension   . Unspecified hearing loss   . Unspecified venous (peripheral) insufficiency   . Unspecified vitamin D deficiency     Family History  Problem Relation Age of Onset  . Emphysema Mother   . Heart disease Father   . Colon cancer Sister   . Colon cancer Unknown   . Pancreatic cancer Unknown     Past Surgical History:  Procedure Laterality Date  . Cataract surg  2009  . CHOLECYSTECTOMY  1997  . DILATION AND CURETTAGE OF UTERUS   1960's  . Left elbow surgery  1992   Social History   Occupational History    Employer: [employer]  Tobacco Use  . Smoking status: Never Smoker  . Smokeless tobacco: Never Used  Substance and Sexual Activity  . Alcohol use: No    Alcohol/week: 0.0 standard drinks  . Drug use: No  . Sexual activity: Not on file

## 2018-04-18 ENCOUNTER — Telehealth (INDEPENDENT_AMBULATORY_CARE_PROVIDER_SITE_OTHER): Payer: Self-pay | Admitting: Orthopaedic Surgery

## 2018-04-18 ENCOUNTER — Telehealth: Payer: Self-pay

## 2018-04-18 DIAGNOSIS — J45909 Unspecified asthma, uncomplicated: Secondary | ICD-10-CM | POA: Diagnosis not present

## 2018-04-18 DIAGNOSIS — I10 Essential (primary) hypertension: Secondary | ICD-10-CM | POA: Diagnosis not present

## 2018-04-18 DIAGNOSIS — I2510 Atherosclerotic heart disease of native coronary artery without angina pectoris: Secondary | ICD-10-CM | POA: Diagnosis not present

## 2018-04-18 DIAGNOSIS — Z7982 Long term (current) use of aspirin: Secondary | ICD-10-CM | POA: Diagnosis not present

## 2018-04-18 DIAGNOSIS — E1151 Type 2 diabetes mellitus with diabetic peripheral angiopathy without gangrene: Secondary | ICD-10-CM | POA: Diagnosis not present

## 2018-04-18 DIAGNOSIS — M1991 Primary osteoarthritis, unspecified site: Secondary | ICD-10-CM | POA: Diagnosis not present

## 2018-04-18 DIAGNOSIS — H9190 Unspecified hearing loss, unspecified ear: Secondary | ICD-10-CM | POA: Diagnosis not present

## 2018-04-18 DIAGNOSIS — K589 Irritable bowel syndrome without diarrhea: Secondary | ICD-10-CM | POA: Diagnosis not present

## 2018-04-18 DIAGNOSIS — E785 Hyperlipidemia, unspecified: Secondary | ICD-10-CM | POA: Diagnosis not present

## 2018-04-18 DIAGNOSIS — M80021D Age-related osteoporosis with current pathological fracture, right humerus, subsequent encounter for fracture with routine healing: Secondary | ICD-10-CM | POA: Diagnosis not present

## 2018-04-18 DIAGNOSIS — Z9181 History of falling: Secondary | ICD-10-CM | POA: Diagnosis not present

## 2018-04-18 NOTE — Telephone Encounter (Signed)
[place]  ([place] would like to know patient limitations and restrictions for  HHT

## 2018-04-18 NOTE — Telephone Encounter (Signed)
Copied from [place] [id number]. Topic: General - Other >> Apr 13, 2018  4:46 PM Keene Breath wrote: Reason for CRM: Patient's daughter, Nichole Silva,  (on the [facility]) called to discuss some issues regarding her mother's condition and care.  Did not want to specify on the phone with agent, but she needs a call back to discuss these issues.  CB#[phone], call after 2:15 tomorrow. >> Apr 18, 2018 10:18 AM Cairrikier Dian Queen, CMA wrote: I spoke to [place] (pt dtr). She stated that pt is starting to decline more noticably than before. She wanted [place] to know that she thinks pt needs to see a neurologist or some medications to help her. She did not know if she could get pt in for a sooner appt than April 2020 but will try.

## 2018-04-19 ENCOUNTER — Telehealth (INDEPENDENT_AMBULATORY_CARE_PROVIDER_SITE_OTHER): Payer: Self-pay | Admitting: Orthopaedic Surgery

## 2018-04-19 NOTE — Telephone Encounter (Signed)
General ROM.  NWB

## 2018-04-19 NOTE — Telephone Encounter (Signed)
See message. Please advise.

## 2018-04-19 NOTE — Telephone Encounter (Signed)
Received voicemail message from [place] (PT) from [facility] needing update on verbal orders for HHPT request. Flore also asked for any restrictions for the patient. The number to contact Flore is [phone]

## 2018-04-20 NOTE — Telephone Encounter (Signed)
Called flor to advise.

## 2018-04-20 NOTE — Telephone Encounter (Signed)
PROM or AROM she is asking?

## 2018-04-20 NOTE — Telephone Encounter (Signed)
CALLED [name] TO ADVISE.

## 2018-04-20 NOTE — Telephone Encounter (Signed)
PROM

## 2018-04-20 NOTE — Telephone Encounter (Signed)
[name] from [facility] called back to give the frequency for the [facility].  She would like to see the patient 2x a week for 4 weeks.  Thank you.

## 2018-04-22 DIAGNOSIS — Z7982 Long term (current) use of aspirin: Secondary | ICD-10-CM | POA: Diagnosis not present

## 2018-04-22 DIAGNOSIS — I2510 Atherosclerotic heart disease of native coronary artery without angina pectoris: Secondary | ICD-10-CM | POA: Diagnosis not present

## 2018-04-22 DIAGNOSIS — M1991 Primary osteoarthritis, unspecified site: Secondary | ICD-10-CM | POA: Diagnosis not present

## 2018-04-22 DIAGNOSIS — M80021D Age-related osteoporosis with current pathological fracture, right humerus, subsequent encounter for fracture with routine healing: Secondary | ICD-10-CM | POA: Diagnosis not present

## 2018-04-22 DIAGNOSIS — I10 Essential (primary) hypertension: Secondary | ICD-10-CM | POA: Diagnosis not present

## 2018-04-22 DIAGNOSIS — E785 Hyperlipidemia, unspecified: Secondary | ICD-10-CM | POA: Diagnosis not present

## 2018-04-22 DIAGNOSIS — E1151 Type 2 diabetes mellitus with diabetic peripheral angiopathy without gangrene: Secondary | ICD-10-CM | POA: Diagnosis not present

## 2018-04-22 DIAGNOSIS — J45909 Unspecified asthma, uncomplicated: Secondary | ICD-10-CM | POA: Diagnosis not present

## 2018-04-22 DIAGNOSIS — H9190 Unspecified hearing loss, unspecified ear: Secondary | ICD-10-CM | POA: Diagnosis not present

## 2018-04-22 DIAGNOSIS — K589 Irritable bowel syndrome without diarrhea: Secondary | ICD-10-CM | POA: Diagnosis not present

## 2018-04-22 DIAGNOSIS — Z9181 History of falling: Secondary | ICD-10-CM | POA: Diagnosis not present

## 2018-04-25 ENCOUNTER — Other Ambulatory Visit: Payer: Self-pay | Admitting: Internal Medicine

## 2018-04-26 DIAGNOSIS — Z9181 History of falling: Secondary | ICD-10-CM | POA: Diagnosis not present

## 2018-04-26 DIAGNOSIS — M80021D Age-related osteoporosis with current pathological fracture, right humerus, subsequent encounter for fracture with routine healing: Secondary | ICD-10-CM | POA: Diagnosis not present

## 2018-04-26 DIAGNOSIS — I10 Essential (primary) hypertension: Secondary | ICD-10-CM | POA: Diagnosis not present

## 2018-04-26 DIAGNOSIS — H9190 Unspecified hearing loss, unspecified ear: Secondary | ICD-10-CM | POA: Diagnosis not present

## 2018-04-26 DIAGNOSIS — J45909 Unspecified asthma, uncomplicated: Secondary | ICD-10-CM | POA: Diagnosis not present

## 2018-04-26 DIAGNOSIS — E1151 Type 2 diabetes mellitus with diabetic peripheral angiopathy without gangrene: Secondary | ICD-10-CM | POA: Diagnosis not present

## 2018-04-26 DIAGNOSIS — Z7982 Long term (current) use of aspirin: Secondary | ICD-10-CM | POA: Diagnosis not present

## 2018-04-26 DIAGNOSIS — K589 Irritable bowel syndrome without diarrhea: Secondary | ICD-10-CM | POA: Diagnosis not present

## 2018-04-26 DIAGNOSIS — E785 Hyperlipidemia, unspecified: Secondary | ICD-10-CM | POA: Diagnosis not present

## 2018-04-26 DIAGNOSIS — I2510 Atherosclerotic heart disease of native coronary artery without angina pectoris: Secondary | ICD-10-CM | POA: Diagnosis not present

## 2018-04-26 DIAGNOSIS — M1991 Primary osteoarthritis, unspecified site: Secondary | ICD-10-CM | POA: Diagnosis not present

## 2018-04-28 DIAGNOSIS — I10 Essential (primary) hypertension: Secondary | ICD-10-CM | POA: Diagnosis not present

## 2018-04-28 DIAGNOSIS — E785 Hyperlipidemia, unspecified: Secondary | ICD-10-CM | POA: Diagnosis not present

## 2018-04-28 DIAGNOSIS — Z9181 History of falling: Secondary | ICD-10-CM | POA: Diagnosis not present

## 2018-04-28 DIAGNOSIS — I2510 Atherosclerotic heart disease of native coronary artery without angina pectoris: Secondary | ICD-10-CM | POA: Diagnosis not present

## 2018-04-28 DIAGNOSIS — K589 Irritable bowel syndrome without diarrhea: Secondary | ICD-10-CM | POA: Diagnosis not present

## 2018-04-28 DIAGNOSIS — Z7982 Long term (current) use of aspirin: Secondary | ICD-10-CM | POA: Diagnosis not present

## 2018-04-28 DIAGNOSIS — M1991 Primary osteoarthritis, unspecified site: Secondary | ICD-10-CM | POA: Diagnosis not present

## 2018-04-28 DIAGNOSIS — J45909 Unspecified asthma, uncomplicated: Secondary | ICD-10-CM | POA: Diagnosis not present

## 2018-04-28 DIAGNOSIS — M80021D Age-related osteoporosis with current pathological fracture, right humerus, subsequent encounter for fracture with routine healing: Secondary | ICD-10-CM | POA: Diagnosis not present

## 2018-04-28 DIAGNOSIS — E1151 Type 2 diabetes mellitus with diabetic peripheral angiopathy without gangrene: Secondary | ICD-10-CM | POA: Diagnosis not present

## 2018-04-28 DIAGNOSIS — H9190 Unspecified hearing loss, unspecified ear: Secondary | ICD-10-CM | POA: Diagnosis not present

## 2018-05-03 DIAGNOSIS — M80021D Age-related osteoporosis with current pathological fracture, right humerus, subsequent encounter for fracture with routine healing: Secondary | ICD-10-CM | POA: Diagnosis not present

## 2018-05-03 DIAGNOSIS — K589 Irritable bowel syndrome without diarrhea: Secondary | ICD-10-CM | POA: Diagnosis not present

## 2018-05-03 DIAGNOSIS — E1151 Type 2 diabetes mellitus with diabetic peripheral angiopathy without gangrene: Secondary | ICD-10-CM | POA: Diagnosis not present

## 2018-05-03 DIAGNOSIS — Z7982 Long term (current) use of aspirin: Secondary | ICD-10-CM | POA: Diagnosis not present

## 2018-05-03 DIAGNOSIS — H9190 Unspecified hearing loss, unspecified ear: Secondary | ICD-10-CM | POA: Diagnosis not present

## 2018-05-03 DIAGNOSIS — Z9181 History of falling: Secondary | ICD-10-CM | POA: Diagnosis not present

## 2018-05-03 DIAGNOSIS — I10 Essential (primary) hypertension: Secondary | ICD-10-CM | POA: Diagnosis not present

## 2018-05-03 DIAGNOSIS — J45909 Unspecified asthma, uncomplicated: Secondary | ICD-10-CM | POA: Diagnosis not present

## 2018-05-03 DIAGNOSIS — M1991 Primary osteoarthritis, unspecified site: Secondary | ICD-10-CM | POA: Diagnosis not present

## 2018-05-03 DIAGNOSIS — I2510 Atherosclerotic heart disease of native coronary artery without angina pectoris: Secondary | ICD-10-CM | POA: Diagnosis not present

## 2018-05-03 DIAGNOSIS — E785 Hyperlipidemia, unspecified: Secondary | ICD-10-CM | POA: Diagnosis not present

## 2018-05-05 DIAGNOSIS — Z9181 History of falling: Secondary | ICD-10-CM | POA: Diagnosis not present

## 2018-05-05 DIAGNOSIS — Z7982 Long term (current) use of aspirin: Secondary | ICD-10-CM | POA: Diagnosis not present

## 2018-05-05 DIAGNOSIS — M80021D Age-related osteoporosis with current pathological fracture, right humerus, subsequent encounter for fracture with routine healing: Secondary | ICD-10-CM | POA: Diagnosis not present

## 2018-05-05 DIAGNOSIS — M1991 Primary osteoarthritis, unspecified site: Secondary | ICD-10-CM | POA: Diagnosis not present

## 2018-05-05 DIAGNOSIS — K589 Irritable bowel syndrome without diarrhea: Secondary | ICD-10-CM | POA: Diagnosis not present

## 2018-05-05 DIAGNOSIS — H9190 Unspecified hearing loss, unspecified ear: Secondary | ICD-10-CM | POA: Diagnosis not present

## 2018-05-05 DIAGNOSIS — I2510 Atherosclerotic heart disease of native coronary artery without angina pectoris: Secondary | ICD-10-CM | POA: Diagnosis not present

## 2018-05-05 DIAGNOSIS — J45909 Unspecified asthma, uncomplicated: Secondary | ICD-10-CM | POA: Diagnosis not present

## 2018-05-05 DIAGNOSIS — E1151 Type 2 diabetes mellitus with diabetic peripheral angiopathy without gangrene: Secondary | ICD-10-CM | POA: Diagnosis not present

## 2018-05-05 DIAGNOSIS — E785 Hyperlipidemia, unspecified: Secondary | ICD-10-CM | POA: Diagnosis not present

## 2018-05-05 DIAGNOSIS — I10 Essential (primary) hypertension: Secondary | ICD-10-CM | POA: Diagnosis not present

## 2018-05-06 ENCOUNTER — Ambulatory Visit (INDEPENDENT_AMBULATORY_CARE_PROVIDER_SITE_OTHER): Payer: Medicare Other | Admitting: Orthopaedic Surgery

## 2018-05-06 ENCOUNTER — Ambulatory Visit (INDEPENDENT_AMBULATORY_CARE_PROVIDER_SITE_OTHER): Payer: Self-pay

## 2018-05-06 ENCOUNTER — Encounter (INDEPENDENT_AMBULATORY_CARE_PROVIDER_SITE_OTHER): Payer: Self-pay | Admitting: Orthopaedic Surgery

## 2018-05-06 ENCOUNTER — Telehealth (INDEPENDENT_AMBULATORY_CARE_PROVIDER_SITE_OTHER): Payer: Self-pay

## 2018-05-06 DIAGNOSIS — S42211A Unspecified displaced fracture of surgical neck of right humerus, initial encounter for closed fracture: Secondary | ICD-10-CM

## 2018-05-06 NOTE — Progress Notes (Signed)
Office Visit Note   Patient: Nichole Silva           Date of Birth: 06-May-1935           MRN: [id number] Visit Date: 05/06/2018              Requested by: Janith Lima, MD [address] [place], [place] [address] PCP: Janith Lima, MD   Assessment & Plan: Visit Diagnoses:  1. Closed fracture of neck of right humerus, initial encounter     Plan: At this point, she will wean out of the sling completely.  She will continue with range of motion exercises as tolerated.  She will follow-up with [place] in 3 weeks time for repeat evaluation and x-ray.  Follow-Up Instructions: Return in about 3 weeks (around 05/27/2018).   Orders:  Orders Placed This Encounter  Procedures  . XR Humerus Right   No orders of the defined types were placed in this encounter.     Procedures: No procedures performed   Clinical Data: No additional findings.   Subjective: Chief Complaint  Patient presents with  . Right Shoulder - Pain, Follow-up    HPI patient is a pleasant 83 year old female who presents to our clinic today 5 weeks out nondisplaced fracture right humeral neck.  She has been wearing her sling for comfort.  She has been compliant nonweightbearing.  She has been working with home health physical therapy with range of motion exercises.  Doing well.  Overall dramatically improved.  Review of Systems as detailed in HPI.  All others reviewed and are negative.   Objective: Vital Signs: There were no vitals taken for this visit.  Physical Exam well-developed well-nourished female no acute distress.  Alert and oriented x3.  Ortho Exam examination of her right shoulder reveals minimal tenderness to deep palpation of the fracture site.  She is neurovascularly intact distally.  Specialty Comments:  No specialty comments available.  Imaging: Xr Humerus Right  Result Date: 05/06/2018 X-rays demonstrate stable alignment of the humeral neck    PMFS History: Patient  Active Problem List   Diagnosis Date Noted  . Closed fracture of neck of right humerus 03/31/2018  . Estrogen deficiency 03/08/2017  . Age-related osteoporosis with current pathological fracture 03/08/2017  . Eczema 05/28/2016  . Asthma, moderate persistent 07/29/2015  . IBS (irritable bowel syndrome) 01/30/2015  . Pessary maintenance 10/25/2013  . Cystocele with uterine prolapse 10/25/2013  . Anxiety 10/06/2013  . Routine general medical examination at a health care facility 09/14/2013  . Vitamin D deficiency 02/25/2009  . DEGENERATIVE JOINT DISEASE 02/27/2008  . Type II diabetes mellitus with manifestations ([place]) 09/02/2007  . Hyperlipidemia with target LDL less than 100 07/05/2007  . Essential hypertension 07/05/2007  . Coronary atherosclerosis 07/05/2007   Past Medical History:  Diagnosis Date  . Allergic rhinitis, cause unspecified   . Anxiety state, unspecified   . Asthma   . Coronary atherosclerosis of unspecified type of vessel, native or graft   . Depression   . Hyperlipidemia   . IBS (irritable bowel syndrome)   . Osteoarthrosis, unspecified whether generalized or localized, unspecified site   . Type II or unspecified type diabetes mellitus without mention of complication, not stated as uncontrolled   . Unspecified chronic bronchitis ([place])   . Unspecified essential hypertension   . Unspecified hearing loss   . Unspecified venous (peripheral) insufficiency   . Unspecified vitamin D deficiency     Family History  Problem  Relation Age of Onset  . Emphysema Mother   . Heart disease Father   . Colon cancer Sister   . Colon cancer Unknown   . Pancreatic cancer Unknown     Past Surgical History:  Procedure Laterality Date  . Cataract surg  2009  . CHOLECYSTECTOMY  1997  . DILATION AND CURETTAGE OF UTERUS  1960's  . Left elbow surgery  1992   Social History   Occupational History    Employer: [employer]  Tobacco Use  . Smoking status: Never Smoker  .  Smokeless tobacco: Never Used  Substance and Sexual Activity  . Alcohol use: No    Alcohol/week: 0.0 standard drinks  . Drug use: No  . Sexual activity: Not on file

## 2018-05-06 NOTE — Telephone Encounter (Signed)
Nichole Silva from [facility] orders to Extend HHPT for 3 weeks.

## 2018-05-10 ENCOUNTER — Ambulatory Visit (INDEPENDENT_AMBULATORY_CARE_PROVIDER_SITE_OTHER): Payer: Medicare Other | Admitting: Orthopaedic Surgery

## 2018-05-10 DIAGNOSIS — Z9181 History of falling: Secondary | ICD-10-CM | POA: Diagnosis not present

## 2018-05-10 DIAGNOSIS — J45909 Unspecified asthma, uncomplicated: Secondary | ICD-10-CM | POA: Diagnosis not present

## 2018-05-10 DIAGNOSIS — I10 Essential (primary) hypertension: Secondary | ICD-10-CM | POA: Diagnosis not present

## 2018-05-10 DIAGNOSIS — Z7982 Long term (current) use of aspirin: Secondary | ICD-10-CM | POA: Diagnosis not present

## 2018-05-10 DIAGNOSIS — I2510 Atherosclerotic heart disease of native coronary artery without angina pectoris: Secondary | ICD-10-CM | POA: Diagnosis not present

## 2018-05-10 DIAGNOSIS — E1151 Type 2 diabetes mellitus with diabetic peripheral angiopathy without gangrene: Secondary | ICD-10-CM | POA: Diagnosis not present

## 2018-05-10 DIAGNOSIS — M80021D Age-related osteoporosis with current pathological fracture, right humerus, subsequent encounter for fracture with routine healing: Secondary | ICD-10-CM | POA: Diagnosis not present

## 2018-05-10 DIAGNOSIS — K589 Irritable bowel syndrome without diarrhea: Secondary | ICD-10-CM | POA: Diagnosis not present

## 2018-05-10 DIAGNOSIS — E785 Hyperlipidemia, unspecified: Secondary | ICD-10-CM | POA: Diagnosis not present

## 2018-05-10 DIAGNOSIS — M1991 Primary osteoarthritis, unspecified site: Secondary | ICD-10-CM | POA: Diagnosis not present

## 2018-05-10 DIAGNOSIS — H9190 Unspecified hearing loss, unspecified ear: Secondary | ICD-10-CM | POA: Diagnosis not present

## 2018-05-11 ENCOUNTER — Telehealth (INDEPENDENT_AMBULATORY_CARE_PROVIDER_SITE_OTHER): Payer: Self-pay | Admitting: Orthopaedic Surgery

## 2018-05-11 DIAGNOSIS — Z7982 Long term (current) use of aspirin: Secondary | ICD-10-CM | POA: Diagnosis not present

## 2018-05-11 DIAGNOSIS — H9190 Unspecified hearing loss, unspecified ear: Secondary | ICD-10-CM | POA: Diagnosis not present

## 2018-05-11 DIAGNOSIS — E1151 Type 2 diabetes mellitus with diabetic peripheral angiopathy without gangrene: Secondary | ICD-10-CM | POA: Diagnosis not present

## 2018-05-11 DIAGNOSIS — I2510 Atherosclerotic heart disease of native coronary artery without angina pectoris: Secondary | ICD-10-CM | POA: Diagnosis not present

## 2018-05-11 DIAGNOSIS — I10 Essential (primary) hypertension: Secondary | ICD-10-CM | POA: Diagnosis not present

## 2018-05-11 DIAGNOSIS — K589 Irritable bowel syndrome without diarrhea: Secondary | ICD-10-CM | POA: Diagnosis not present

## 2018-05-11 DIAGNOSIS — Z9181 History of falling: Secondary | ICD-10-CM | POA: Diagnosis not present

## 2018-05-11 DIAGNOSIS — M80021D Age-related osteoporosis with current pathological fracture, right humerus, subsequent encounter for fracture with routine healing: Secondary | ICD-10-CM | POA: Diagnosis not present

## 2018-05-11 DIAGNOSIS — E785 Hyperlipidemia, unspecified: Secondary | ICD-10-CM | POA: Diagnosis not present

## 2018-05-11 DIAGNOSIS — J45909 Unspecified asthma, uncomplicated: Secondary | ICD-10-CM | POA: Diagnosis not present

## 2018-05-11 DIAGNOSIS — M1991 Primary osteoarthritis, unspecified site: Secondary | ICD-10-CM | POA: Diagnosis not present

## 2018-05-11 NOTE — Telephone Encounter (Signed)
ok 

## 2018-05-11 NOTE — Telephone Encounter (Signed)
Called [name] back. She states patient  just wants to do home exercises. Does not want to do extension on HHPT. Declined HHPT.

## 2018-05-11 NOTE — Telephone Encounter (Signed)
Flore-(PT) with [facility] called advised she is on her way to see the patient now and need to confirm range of motion. The number to contact Flore is [phone]

## 2018-05-11 NOTE — Telephone Encounter (Signed)
SEE MESSAGE

## 2018-05-11 NOTE — Telephone Encounter (Signed)
ROm as tolerated

## 2018-05-18 ENCOUNTER — Other Ambulatory Visit: Payer: Self-pay | Admitting: Internal Medicine

## 2018-05-18 DIAGNOSIS — E1165 Type 2 diabetes mellitus with hyperglycemia: Secondary | ICD-10-CM

## 2018-05-18 DIAGNOSIS — I10 Essential (primary) hypertension: Secondary | ICD-10-CM

## 2018-05-18 DIAGNOSIS — IMO0002 Reserved for concepts with insufficient information to code with codable children: Secondary | ICD-10-CM

## 2018-05-31 ENCOUNTER — Ambulatory Visit (INDEPENDENT_AMBULATORY_CARE_PROVIDER_SITE_OTHER): Payer: Medicare Other | Admitting: Orthopaedic Surgery

## 2018-06-02 ENCOUNTER — Other Ambulatory Visit: Payer: Self-pay | Admitting: Internal Medicine

## 2018-06-13 DIAGNOSIS — N819 Female genital prolapse, unspecified: Secondary | ICD-10-CM | POA: Diagnosis not present

## 2018-06-13 DIAGNOSIS — Z1231 Encounter for screening mammogram for malignant neoplasm of breast: Secondary | ICD-10-CM | POA: Diagnosis not present

## 2018-06-13 DIAGNOSIS — Z960 Presence of urogenital implants: Secondary | ICD-10-CM | POA: Diagnosis not present

## 2018-06-20 ENCOUNTER — Other Ambulatory Visit: Payer: Self-pay | Admitting: Internal Medicine

## 2018-06-20 DIAGNOSIS — K589 Irritable bowel syndrome without diarrhea: Secondary | ICD-10-CM

## 2018-06-21 ENCOUNTER — Other Ambulatory Visit: Payer: Self-pay | Admitting: Internal Medicine

## 2018-07-13 ENCOUNTER — Other Ambulatory Visit: Payer: Self-pay | Admitting: Internal Medicine

## 2018-07-25 ENCOUNTER — Other Ambulatory Visit: Payer: Self-pay | Admitting: Internal Medicine

## 2018-07-25 DIAGNOSIS — J4540 Moderate persistent asthma, uncomplicated: Secondary | ICD-10-CM

## 2018-08-01 ENCOUNTER — Other Ambulatory Visit: Payer: Self-pay | Admitting: Internal Medicine

## 2018-09-02 ENCOUNTER — Ambulatory Visit: Payer: Self-pay | Admitting: *Deleted

## 2018-09-02 NOTE — Telephone Encounter (Signed)
Pt and her daughter, Nevin Bloodgood, called stating that the pt twisted her right ankle 2 days ago; she is having swelling, and her toes are almost black; they say that the pt was sitting down, but when she stood up, her foot felt like it was asleep, and when she stood up it buckled; the pt says that she did not fall; her foot/ankle are swollen and red; she has taken tylenol and applied ice but has relief in symptoms; recommedations made per nurse triage protocol; they verbalized understanding; Nevin Bloodgood states that she has already made her mother an appointment at an urgent care; the pt is normally seen by Dr Scarlette Calico, [facility]; will route to office for notification.   Reason for Disposition . [1] SEVERE pain (e.g., excruciating, unable to walk) AND [2] not improved after 2 hours of pain medicine  Answer Assessment - Initial Assessment Questions 1. LOCATION: "Which joint is swollen?"     Right akle 2. ONSET: "When did the swelling start?"     08/31/2018 3. SIZE: "How large is the swelling?"     Elevation makes the swelling go down slightly  4. PAIN: "Is there any pain?" If so, ask: "How bad is it?" (Scale 1-10; or mild, moderate, severe)     Pain rated 10 out of 10 with movemet 5. CAUSE: "What do you think caused the swollen joint?"     Not sure 6. OTHER SYMPTOMS: "Do you have any other symptoms?" (e.g., fever, chest pain, difficulty breathing, calf pain)     no 7. PREGNANCY: "Is there any chance you are pregnant?" "When was your last menstrual period?"     no  Protocols used: ANKLE SWELLING-A-AH

## 2018-09-05 ENCOUNTER — Emergency Department (HOSPITAL_COMMUNITY): Payer: Medicare Other

## 2018-09-05 ENCOUNTER — Emergency Department (HOSPITAL_COMMUNITY)
Admission: EM | Admit: 2018-09-05 | Discharge: 2018-09-05 | Disposition: A | Discharge status: Home / Self Care | Payer: Medicare Other | Attending: Emergency Medicine | Admitting: Emergency Medicine

## 2018-09-05 ENCOUNTER — Other Ambulatory Visit: Payer: Self-pay

## 2018-09-05 ENCOUNTER — Encounter (HOSPITAL_COMMUNITY): Payer: Self-pay

## 2018-09-05 ENCOUNTER — Ambulatory Visit: Payer: Self-pay

## 2018-09-05 DIAGNOSIS — Y999 Unspecified external cause status: Secondary | ICD-10-CM | POA: Diagnosis not present

## 2018-09-05 DIAGNOSIS — I2510 Atherosclerotic heart disease of native coronary artery without angina pectoris: Secondary | ICD-10-CM | POA: Diagnosis not present

## 2018-09-05 DIAGNOSIS — S96911A Strain of unspecified muscle and tendon at ankle and foot level, right foot, initial encounter: Secondary | ICD-10-CM | POA: Diagnosis not present

## 2018-09-05 DIAGNOSIS — Z7982 Long term (current) use of aspirin: Secondary | ICD-10-CM | POA: Diagnosis not present

## 2018-09-05 DIAGNOSIS — Z79899 Other long term (current) drug therapy: Secondary | ICD-10-CM | POA: Insufficient documentation

## 2018-09-05 DIAGNOSIS — Y939 Activity, unspecified: Secondary | ICD-10-CM | POA: Diagnosis not present

## 2018-09-05 DIAGNOSIS — S92001A Unspecified fracture of right calcaneus, initial encounter for closed fracture: Secondary | ICD-10-CM | POA: Insufficient documentation

## 2018-09-05 DIAGNOSIS — J45909 Unspecified asthma, uncomplicated: Secondary | ICD-10-CM | POA: Insufficient documentation

## 2018-09-05 DIAGNOSIS — E119 Type 2 diabetes mellitus without complications: Secondary | ICD-10-CM | POA: Insufficient documentation

## 2018-09-05 DIAGNOSIS — M7989 Other specified soft tissue disorders: Secondary | ICD-10-CM | POA: Diagnosis not present

## 2018-09-05 DIAGNOSIS — S92024A Nondisplaced fracture of anterior process of right calcaneus, initial encounter for closed fracture: Secondary | ICD-10-CM | POA: Diagnosis not present

## 2018-09-05 DIAGNOSIS — X509XXA Other and unspecified overexertion or strenuous movements or postures, initial encounter: Secondary | ICD-10-CM | POA: Diagnosis not present

## 2018-09-05 DIAGNOSIS — I10 Essential (primary) hypertension: Secondary | ICD-10-CM | POA: Insufficient documentation

## 2018-09-05 DIAGNOSIS — S99921A Unspecified injury of right foot, initial encounter: Secondary | ICD-10-CM | POA: Diagnosis present

## 2018-09-05 DIAGNOSIS — Y929 Unspecified place or not applicable: Secondary | ICD-10-CM | POA: Diagnosis not present

## 2018-09-05 DIAGNOSIS — S92021A Displaced fracture of anterior process of right calcaneus, initial encounter for closed fracture: Secondary | ICD-10-CM | POA: Diagnosis not present

## 2018-09-05 HISTORY — DX: Cystocele, unspecified: N81.10

## 2018-09-05 IMAGING — DX RIGHT ANKLE - COMPLETE 3+ VIEW
3 series · 3 of 3 positions shown · non-contrast
Comparison: Concurrently obtained radiographs of the foot
09/05/2018

CLINICAL DATA: 83-year-old female with right foot and ankle pain
after a twisting injury suffered to 4 days previously

EXAM:
RIGHT ANKLE - COMPLETE 3+ VIEW

[ankle ap]
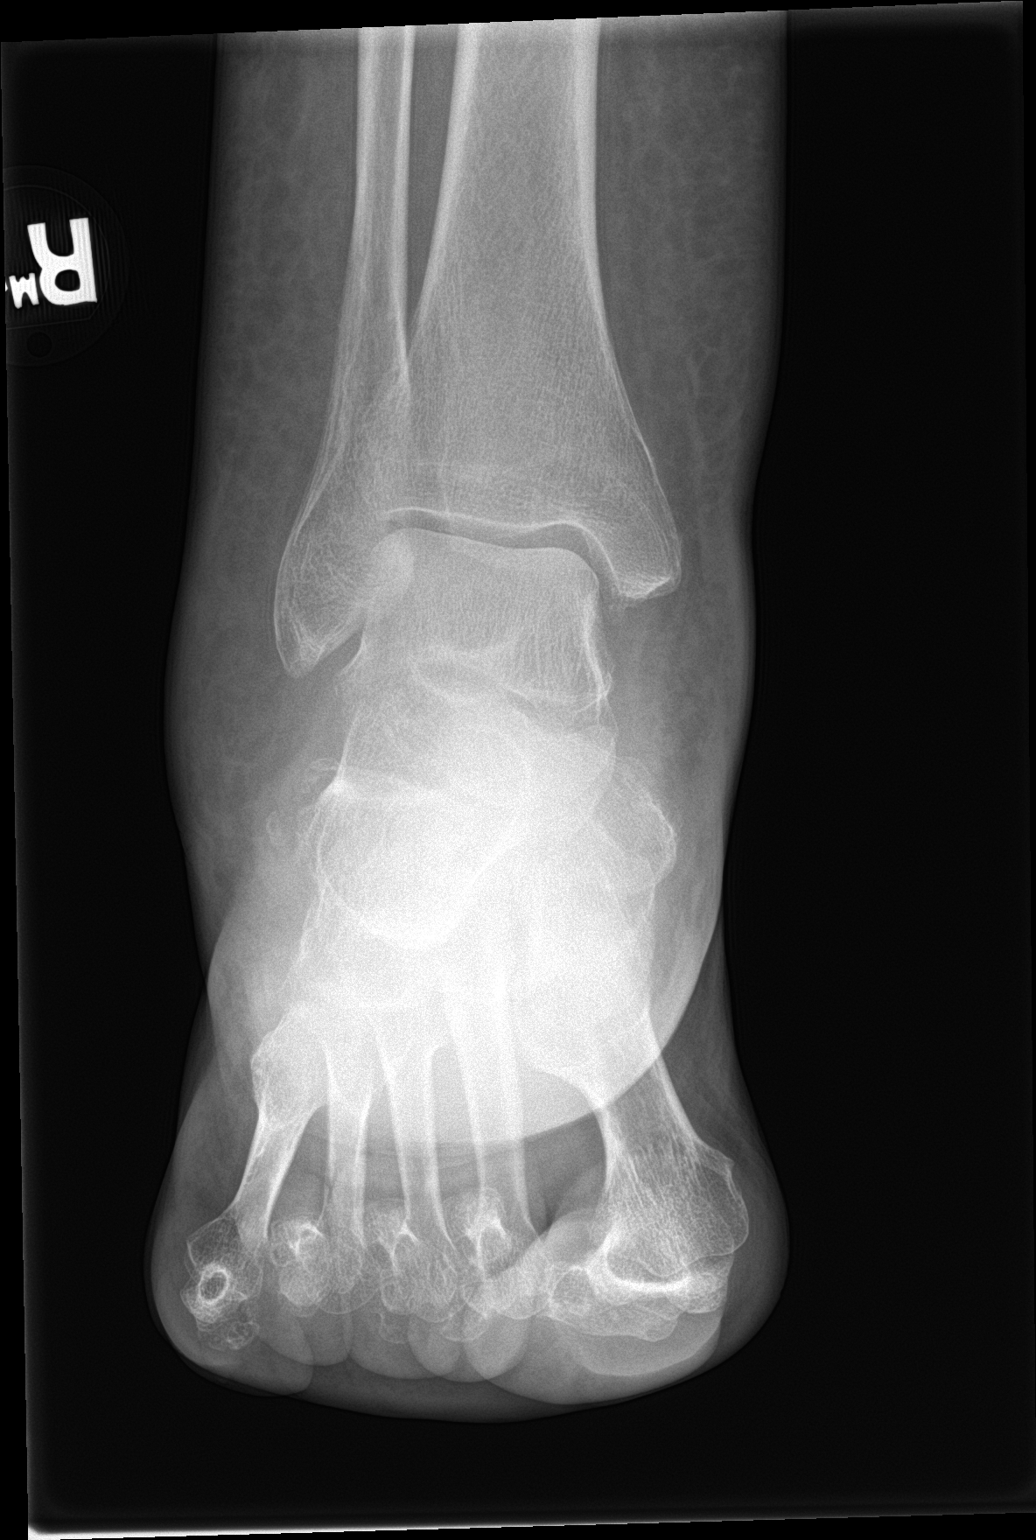

[ankle obl]
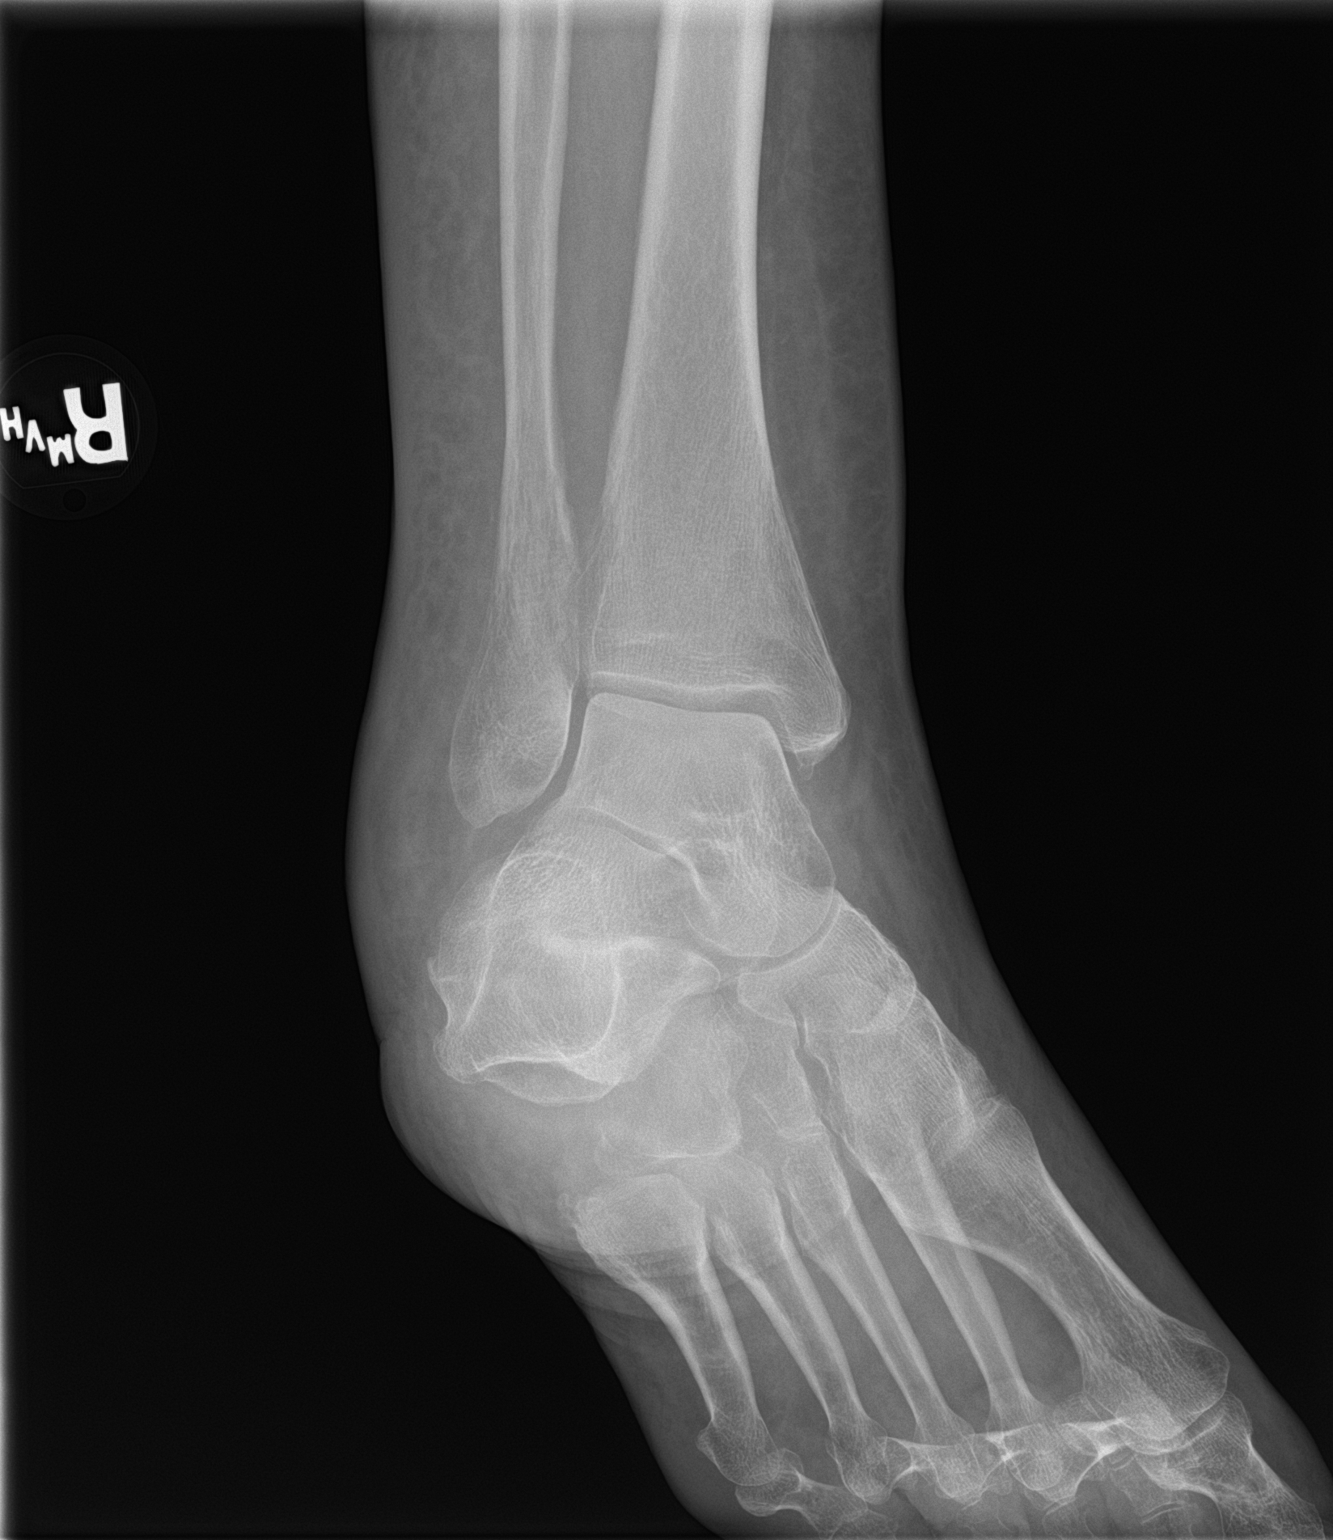

[ankle lat]
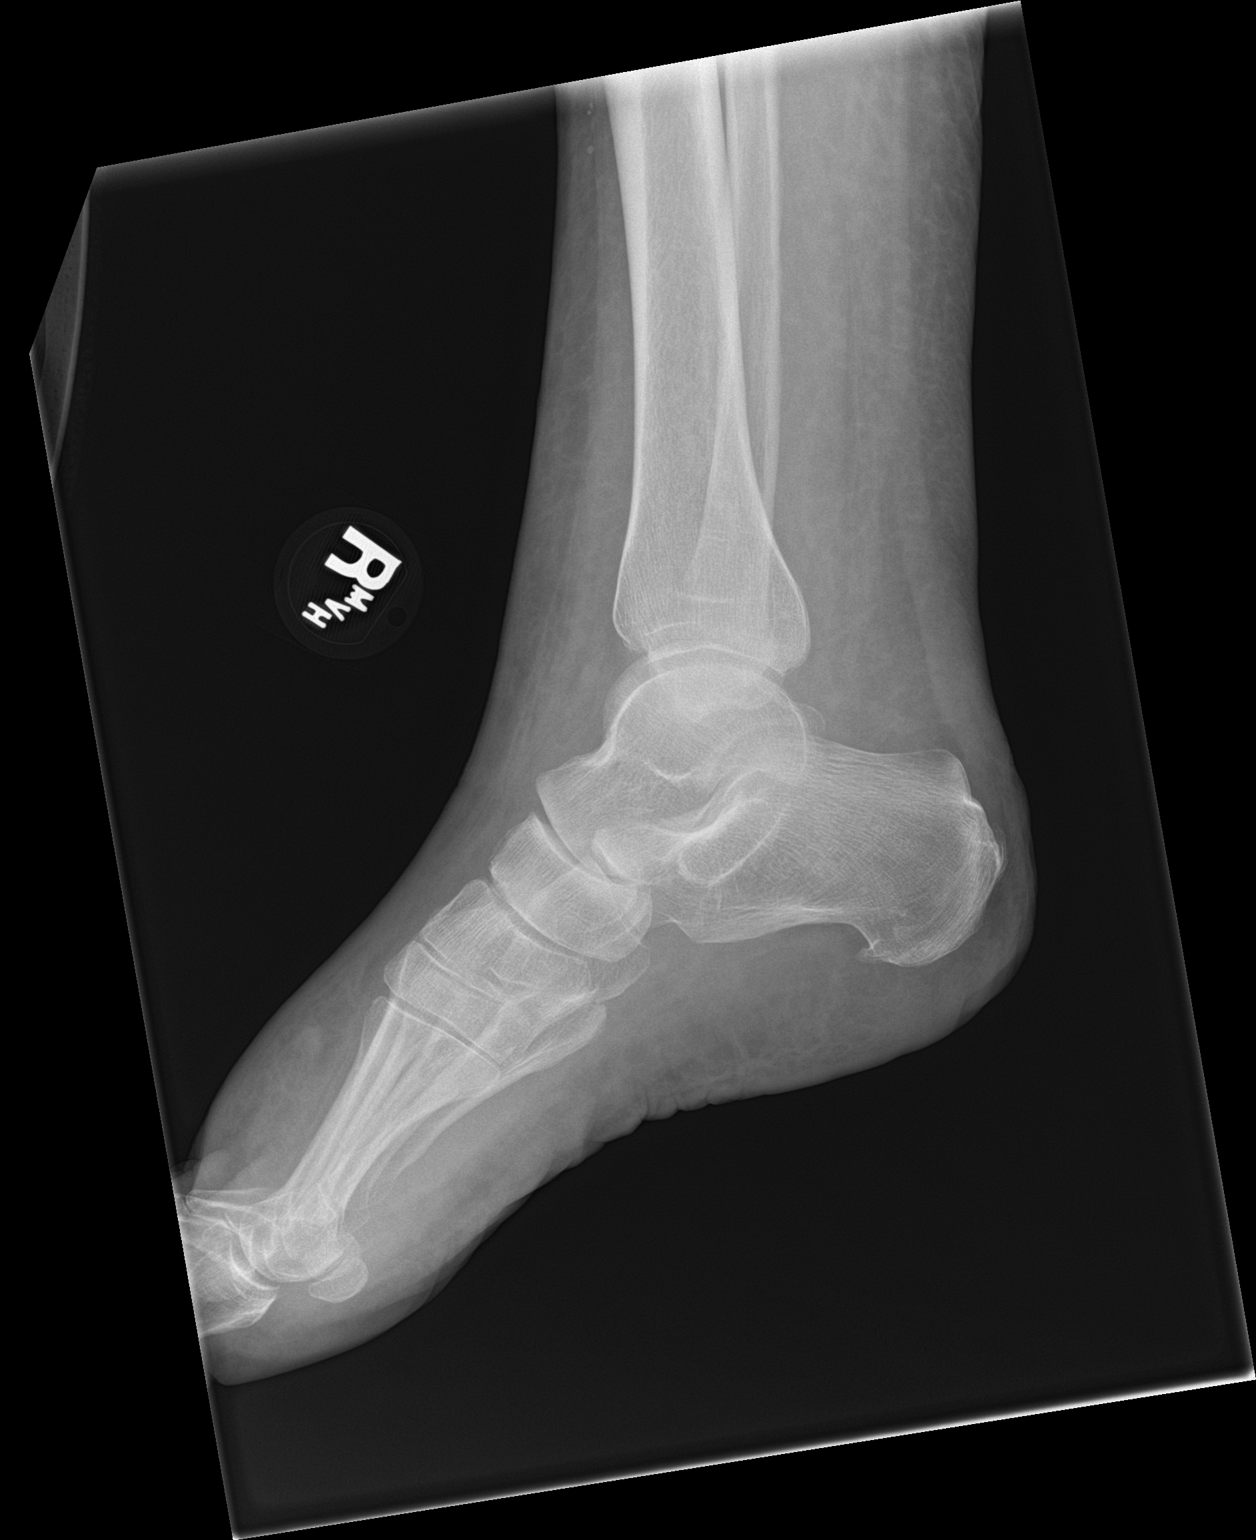

[3 of 3 positions shown; findings below may reference images not displayed]

FINDINGS: Diffuse soft tissue swelling is present about the ankle and dorsal
forefoot. On the AP view, there are 2 small osseous fragments
adjacent to the lateral talar neck which appear well corticated.
Otherwise, no acute bony lesion or malalignment. No evidence of
ankle joint effusion. No lytic or blastic osseous lesion.
IMPRESSION: Osseous fragments adjacent to the lateral aspect of the talar neck
appear well-corticated and are favored to be chronic. However, these
could also represent small avulsion fractures at the ankle joint
capsule insertion in the appropriate clinical setting.

Diffuse soft tissue swelling about the ankle and dorsal forefoot.

No evidence of joint effusion.

## 2018-09-05 IMAGING — CR RIGHT FOOT COMPLETE - 3+ VIEW
3 series · 3 of 3 positions shown · non-contrast
Comparison: None.

CLINICAL DATA: Pain and bruising after a twisting injury to the
foot.

EXAM:
RIGHT FOOT COMPLETE - 3+ VIEW

[x foot ap right]
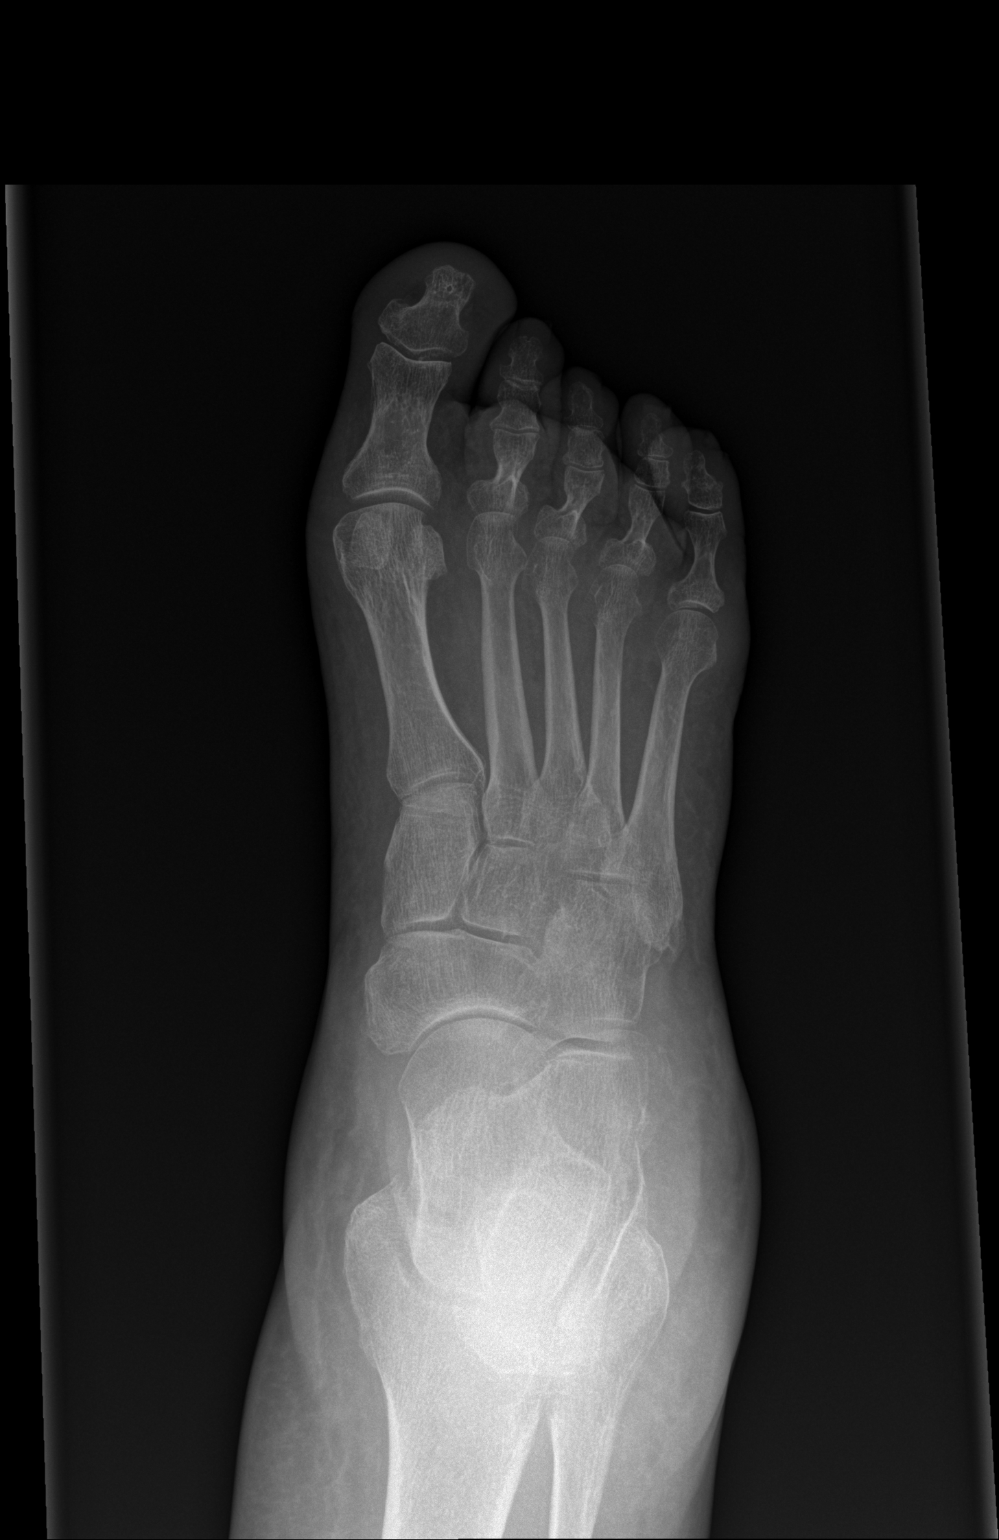

[x foot obl right]
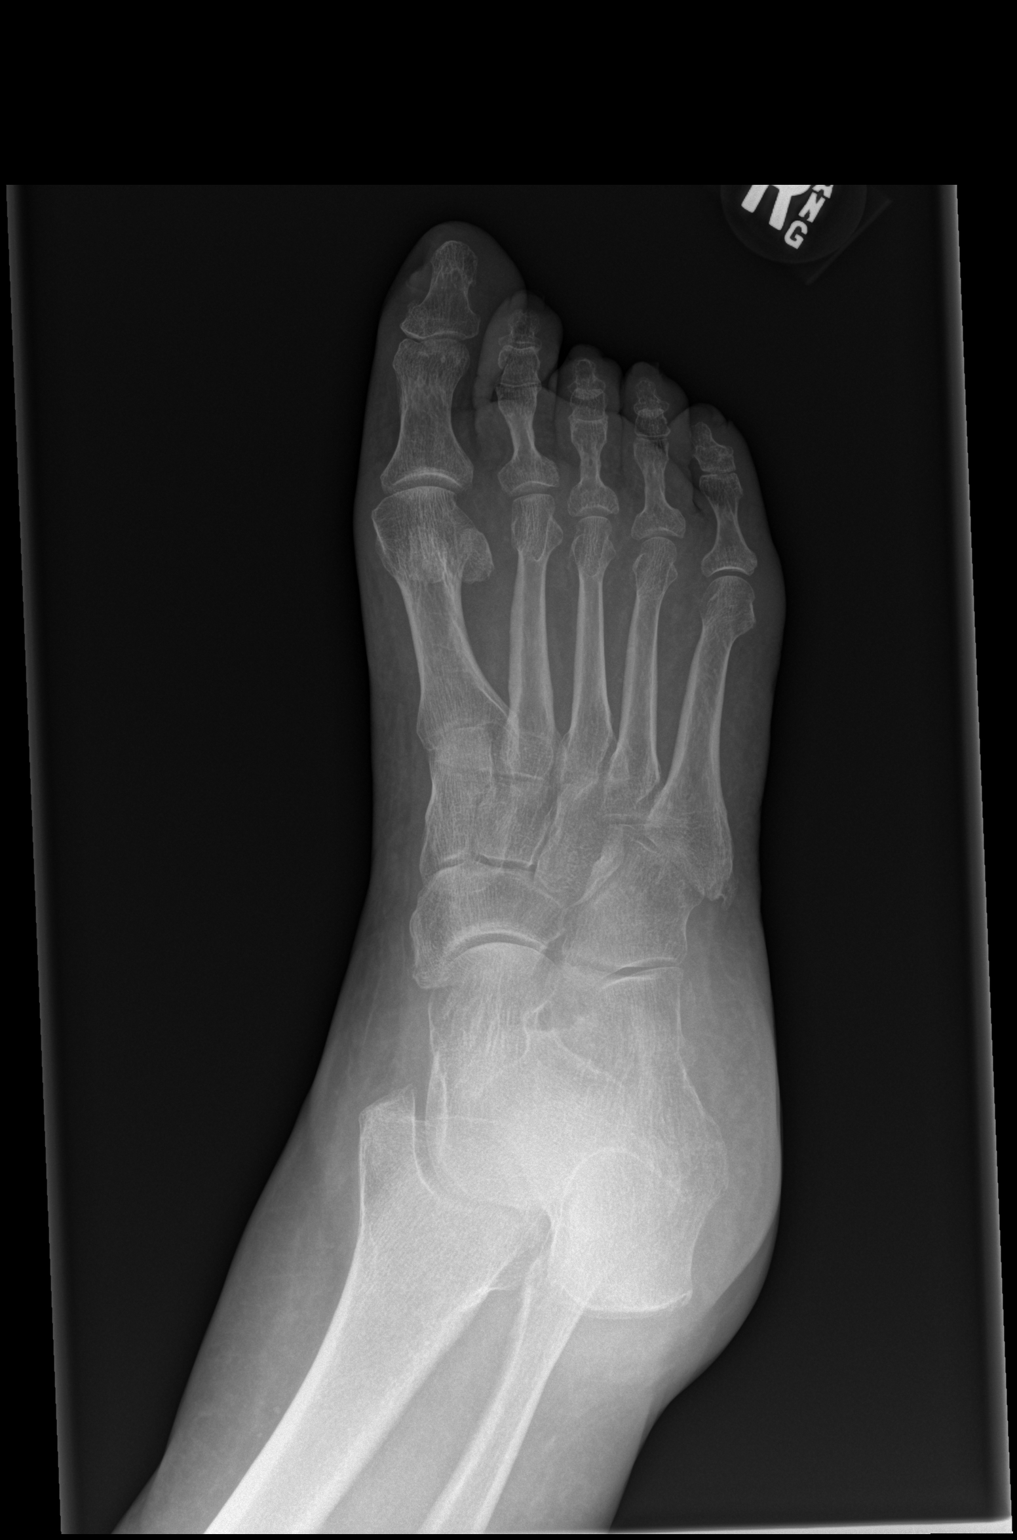

[x foot lat right]
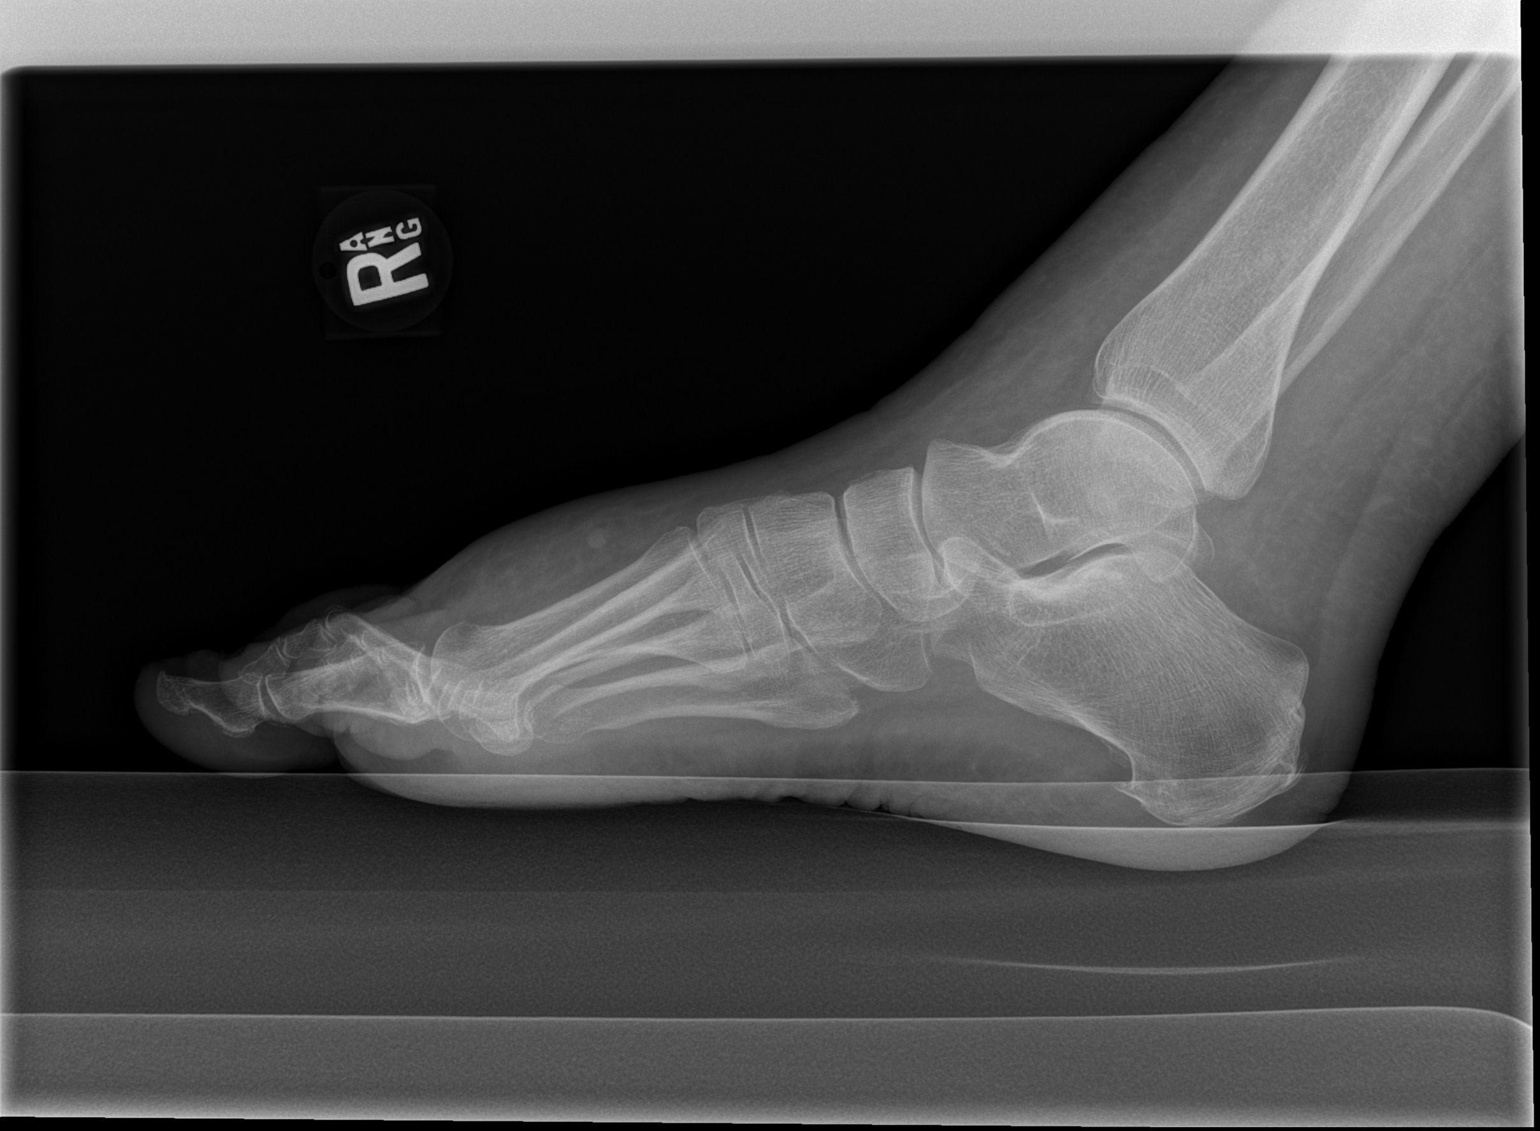

[3 of 3 positions shown; findings below may reference images not displayed]

FINDINGS: There is no evidence of fracture or dislocation. There is no
evidence of arthropathy or other focal bone abnormality. Slight soft
tissue prominence on the dorsum of the forefoot.
IMPRESSION: No acute bone abnormality.

## 2018-09-05 NOTE — ED Notes (Signed)
Patient soiled brief. Patient cleaned and new brief placed.

## 2018-09-05 NOTE — ED Notes (Signed)
Ortho tech has been called 

## 2018-09-05 NOTE — Telephone Encounter (Signed)
Pt daughter called to say about 4 days ago her mother stood up and twisted her foot.. She states that her mother said she stood up and her foot had gone to sleep.  When she stepped down she twisted the foot. Her ankle swelled immediately. She has been icing the area and elevating.  The pain ranges from 5-8. She does bare wait.The daughter states that her mothers toes were curled and bruised. Per protocol pt will go to the ER for evaluation of her injury. Care advice read to daughter paula.  Nevin Bloodgood verbalized understanding of instructions.  Reason for Disposition . Sounds like a serious injury to the triager  Answer Assessment - Initial Assessment Questions 1. MECHANISM: "How did the injury happen?" (e.g., twisting injury, direct blow)      Stood up foot asleep miss step and twisted 2. ONSET: "When did the injury happen?" (Minutes or hours ago)     Approximately 4 days ago  3. LOCATION: "Where is the injury located?"     Rt 4. APPEARANCE of INJURY: "What does the injury look like?"      Swollen and bruised 5. WEIGHT-BEARING: "Can you put weight on that foot?" "Can you walk (four steps or more)?"       Walks  6. SIZE: For cuts, bruises, or swelling, ask: "How large is it?" (e.g., inches or centimeters;  entire joint)      Whole top of foot and ankle 7. PAIN: "Is there pain?" If so, ask: "How bad is the pain?"    (e.g., Scale 1-10; or mild, moderate, severe)     5-8 8. TETANUS: For any breaks in the skin, ask: "When was the last tetanus booster?"     Not applicable 9. OTHER SYMPTOMS: "Do you have any other symptoms?"      no 10. PREGNANCY: "Is there any chance you are pregnant?" "When was your last menstrual period?"       N/A  Protocols used: FOOT AND ANKLE INJURY-A-AH

## 2018-09-05 NOTE — ED Provider Notes (Signed)
[place] DEPT Provider Note   CSN: [id number] Arrival date & time: 09/05/18  1418    History   Chief Complaint Chief Complaint  Patient presents with   Foot Pain   Foot Swelling    HPI Nichole Silva is a 84 y.o. female.     The history is provided by the patient and medical records. No language interpreter was used.  Foot Pain    Nichole Silva is a 84 y.o. female who presents to the Emergency Department complaining of foot pain/swelling.  Four days ago she was going to walk and her right foot bent at the midfoot and inverted.  She was able to catch herself and not fall but she complains of progressive swelling and pain.  Pain is located in the mid and lateral right foot.  There is no pain at rest but there is pain on weight bearing. Initially there is significant bruising and swelling to the mid foot. She states that the ecchymosis has spread to her toes and now her foot appears more bread. She denies any fevers, chills, chest pain, shortness of breath. She lives at home with her husband. She denies any recent illnesses. Symptoms are moderate and constant in nature. Past Medical History:  Diagnosis Date   Allergic rhinitis, cause unspecified    Anxiety state, unspecified    Asthma    Coronary atherosclerosis of unspecified type of vessel, native or graft    Depression    Female bladder prolapse    Hyperlipidemia    IBS (irritable bowel syndrome)    Osteoarthrosis, unspecified whether generalized or localized, unspecified site    Type II or unspecified type diabetes mellitus without mention of complication, not stated as uncontrolled    Unspecified chronic bronchitis ([place])    Unspecified essential hypertension    Unspecified hearing loss    Unspecified venous (peripheral) insufficiency    Unspecified vitamin D deficiency     Patient Active Problem List   Diagnosis Date Noted   Closed fracture of neck of right humerus  03/31/2018   Estrogen deficiency 03/08/2017   Age-related osteoporosis with current pathological fracture 03/08/2017   Eczema 05/28/2016   Asthma, moderate persistent 07/29/2015   IBS (irritable bowel syndrome) 01/30/2015   Pessary maintenance 10/25/2013   Cystocele with uterine prolapse 10/25/2013   Anxiety 10/06/2013   Routine general medical examination at a health care facility 09/14/2013   Vitamin D deficiency 02/25/2009   DEGENERATIVE JOINT DISEASE 02/27/2008   Type II diabetes mellitus with manifestations ([place]) 09/02/2007   Hyperlipidemia with target LDL less than 100 07/05/2007   Essential hypertension 07/05/2007   Coronary atherosclerosis 07/05/2007    Past Surgical History:  Procedure Laterality Date   Cataract surg  2009   CHOLECYSTECTOMY  1997   DILATION AND CURETTAGE OF UTERUS  1960's   Left elbow surgery  1992     OB History    Gravida  6   Para  4   Term  3   Preterm  1   AB  2   Living  4     SAB  2   TAB      Ectopic      Multiple      Live Births  4            Home Medications    Prior to Admission medications   Medication Sig Start Date End Date Taking? Authorizing Provider  acetaminophen (TYLENOL) 325 MG tablet Take 325 mg  by mouth every 6 (six) hours as needed for mild pain or moderate pain. Pain/headache   Yes [provider]  ALPRAZolam (XANAX) 0.5 MG tablet TAKE 1/2 TO 1 TABLET BY MOUTH 3 TIMES A DAY AS NEEDED Patient taking differently: Take 0.5-1 mg by mouth 3 (three) times daily as needed for anxiety.  08/01/18  Yes Janith Lima, MD  aspirin 81 MG tablet Take 81 mg by mouth daily.     Yes [provider]  atorvastatin (LIPITOR) 40 MG tablet TAKE ONE TABLET BY MOUTH ONCE DAILY Patient taking differently: Take 40 mg by mouth daily at 6 PM.  03/25/18  Yes Janith Lima, MD  BREO ELLIPTA 200-25 MCG/INH AEPB TAKE 1 PUFF BY MOUTH EVERY DAY Patient taking differently: Inhale 1 puff into the  lungs daily.  07/25/18  Yes Janith Lima, MD  cetirizine (ZYRTEC) 10 MG tablet Take 10 mg by mouth daily.     Yes [provider]  dicyclomine (BENTYL) 10 MG capsule TAKE ONE CAPSULE BY MOUTH 3 TIMES A DAY BEFORE MEALS Patient taking differently: Take 10 mg by mouth 3 (three) times daily before meals.  06/20/18  Yes Janith Lima, MD  fluticasone (FLONASE) 50 MCG/ACT nasal spray USE 2 SPRAYS IN EACH NOSTRIL AT BEDTIME Patient taking differently: Place 2 sprays into both nostrils at bedtime.  03/20/18  Yes Janith Lima, MD  furosemide (LASIX) 40 MG tablet TAKE 1 TO 2 TABLETS (40-80 MG TOTAL) BY MOUTH DAILY. Patient taking differently: Take 40-80 mg by mouth daily.  07/13/18  Yes Janith Lima, MD  losartan (COZAAR) 50 MG tablet TAKE 1 TABLET BY MOUTH EVERY DAY Patient taking differently: Take 50 mg by mouth daily.  05/19/18  Yes Marrian Salvage, FNP  NIFEdipine (PROCARDIA XL/NIFEDICAL XL) 60 MG 24 hr tablet TAKE 1 TABLET BY MOUTH EVERY DAY Patient taking differently: Take 60 mg by mouth daily.  06/21/18  Yes Janith Lima, MD  Vitamin D, Ergocalciferol, (DRISDOL) 1.25 MG (50000 UT) CAPS capsule TAKE ONE CAPSULE BY MOUTH EVERY MONDAY Patient taking differently: Take 50,000 Units by mouth every 7 (seven) days.  06/02/18  Yes Janith Lima, MD  fluocinonide-emollient (LIDEX-E) 0.05 % cream Apply 1 application topically 2 (two) times daily. Patient not taking: Reported on 09/05/2018 05/28/16   Janith Lima, MD  PROAIR HFA 108 [phone] Base) MCG/ACT inhaler INHALE 1 TO 2 PUFFS BY MOUTH EVERY 4 HOURS AS NEEDED FOR WHEEZE OR SHORTNESS OF BREATH Patient not taking: Reported on 09/05/2018 06/05/15   Janith Lima, MD    Family History Family History  Problem Relation Age of Onset   Emphysema Mother    Heart disease Father    Colon cancer Sister    Colon cancer Other    Pancreatic cancer Other     Social History Social History   Tobacco Use   Smoking status: Never  Smoker   Smokeless tobacco: Never Used  Substance Use Topics   Alcohol use: No    Alcohol/week: 0.0 standard drinks   Drug use: No     Allergies   Codeine; Metformin and related; and Penicillins   Review of Systems Review of Systems  All other systems reviewed and are negative.    Physical Exam Updated Vital Signs BP 118/61 (BP Location: Left Arm)    Pulse 80    Temp 98.6 F (37 C) (Oral)    Resp 17    Ht 5\' 4"  (1.626 m)  Wt 63.5 kg    SpO2 95%    BMI 24.03 kg/m   Physical Exam Vitals signs and nursing note reviewed.  Constitutional:      Appearance: She is well-developed.  HENT:     Head: Normocephalic and atraumatic.  Cardiovascular:     Rate and Rhythm: Normal rate and regular rhythm.  Pulmonary:     Effort: Pulmonary effort is normal. No respiratory distress.  Musculoskeletal:     Comments: 2+ DP pulses bilaterally. There is one plus none pitting edema to the left lower extremity. There is moderate edema to the right ankle, foot. There is ecchymosis to the toes of the right foot. There is tenderness to palpation over the lateral malleolus of the right foot as well as the mid foot. Flexion extension intact at the ankle. There is no knee, shin tenderness to palpation.  Skin:    General: Skin is warm and dry.  Neurological:     Mental Status: She is alert and oriented to person, place, and time.     Comments: Very hard of hearing. Five out of five strength and bilateral lower extremities with sensation to light touch intact and bilateral lower extremities.  Psychiatric:        Behavior: Behavior normal.      ED Treatments / Results  Labs (all labs ordered are listed, but only abnormal results are displayed) Labs Reviewed - No data to display  EKG None  Radiology Dg Ankle Complete Right  Result Date: 09/05/2018 CLINICAL DATA:  84 year old female with right foot and ankle pain after a twisting injury suffered to 4 days previously EXAM: RIGHT ANKLE -  COMPLETE 3+ VIEW COMPARISON:  Concurrently obtained radiographs of the foot 09/05/2018 FINDINGS: Diffuse soft tissue swelling is present about the ankle and dorsal forefoot. On the AP view, there are 2 small osseous fragments adjacent to the lateral talar neck which appear well corticated. Otherwise, no acute bony lesion or malalignment. No evidence of ankle joint effusion. No lytic or blastic osseous lesion. IMPRESSION: Osseous fragments adjacent to the lateral aspect of the talar neck appear well-corticated and are favored to be chronic. However, these could also represent small avulsion fractures at the ankle joint capsule insertion in the appropriate clinical setting. Diffuse soft tissue swelling about the ankle and dorsal forefoot. No evidence of joint effusion. Electronically Signed   By: Jacqulynn Cadet M.D.   On: 09/05/2018 15:51   Ct Foot Right Wo Contrast  Result Date: 09/05/2018 CLINICAL DATA:  Foot trauma, pain EXAM: CT OF THE RIGHT FOOT WITHOUT CONTRAST TECHNIQUE: Multidetector CT imaging of the right foot was performed according to the standard protocol. Multiplanar CT image reconstructions were also generated. COMPARISON:  None. FINDINGS: Bones/Joint/Cartilage Mildly comminuted fracture of the dorsal peripheral anterolateral aspect of the calcaneus extending into the anterior process. Subtle nondisplaced fracture of the peripheral anterolateral aspect of the talus adjacent to the talonavicular joint. No other acute fracture or dislocation. No aggressive osseous lesion. Normal alignment. No joint effusion. Ligaments Ligaments are suboptimally evaluated by CT. Muscles and Tendons Muscles are normal. No muscle atrophy. Flexor, extensor, peroneal and Achilles tendons are grossly intact. Soft tissue No fluid collection or hematoma.  No soft tissue mass. IMPRESSION: 1. Mildly comminuted fracture of the dorsal peripheral anterolateral aspect of the calcaneus extending into the anterior process. 2.  Subtle nondisplaced fracture of the peripheral anterolateral aspect of the talus adjacent to the talonavicular joint. Electronically Signed   By: Kathreen Devoid  On: 09/05/2018 17:55   Dg Foot Complete Right  Result Date: 09/05/2018 CLINICAL DATA:  Pain and bruising after a twisting injury to the foot. EXAM: RIGHT FOOT COMPLETE - 3+ VIEW COMPARISON:  None. FINDINGS: There is no evidence of fracture or dislocation. There is no evidence of arthropathy or other focal bone abnormality. Slight soft tissue prominence on the dorsum of the forefoot. IMPRESSION: No acute bone abnormality. Electronically Signed   By: Lorriane Shire M.D.   On: 09/05/2018 15:03    Procedures Procedures (including critical care time)  Medications Ordered in ED Medications - No data to display   Initial Impression / Assessment and Plan / ED Course  I have reviewed the triage vital signs and the nursing notes.  Pertinent labs & imaging results that were available during my care of the patient were reviewed by me and considered in my medical decision making (see chart for details).       Patient here for evaluation of right foot pain following an injury four days ago. Plain films are negative for fracture but due to concern for possible lis franc injury a CT of the foot was obtained. CT does demonstrate a calcaneal fracture. Discussed with Dr. Marlou Sa with orthopedics, recommends a walking boot with minimal weight-bearing. Discussed with patient findings of studies and home care recommendations. Discussed outpatient follow-up and return precautions.   Findings and plan were also discussed with the patient's daughter Nevin Bloodgood over the phone.   Final Clinical Impressions(s) / ED Diagnoses   Final diagnoses:  Closed nondisplaced fracture of right calcaneus, unspecified portion of calcaneus, initial encounter    ED Discharge Orders    None       Quintella Reichert, MD 09/05/18 1831

## 2018-09-05 NOTE — Telephone Encounter (Signed)
Tried to contact pt to see how she is feeling and for possibly an in office visit appointment. Pt spouse answered the phone and would let me speak to [facility] or patient. He asked that I call back. I tried to call Curt Bears but vm picked up. There is no power at the [place] office or [facility] for pt dtr to call back.

## 2018-09-05 NOTE — ED Triage Notes (Signed)
Patient reports that she stood to walk and got her foot and toes doubled under her foot. Patient reports bruising and pain to toes and foot.

## 2018-09-05 NOTE — Discharge Instructions (Signed)
You have a broken calcaneous (heel bone).  Wear the boot when you are walking.  Keep your foot elevated as much as possible to decrease swelling.  You can take acetaminophen (tylenol) according to label instructions as needed for pain.

## 2018-09-05 NOTE — ED Notes (Signed)
Rees,MD at bedside.  

## 2018-09-05 NOTE — Telephone Encounter (Signed)
Pt went to ED

## 2018-09-05 NOTE — ED Notes (Signed)
Patient transported to x-ray. ?

## 2018-09-05 NOTE — Telephone Encounter (Signed)
Other telephone encounter came in. Pt dtr give RN triage advise to go to ED. Reviewed chart and pt is in ED.

## 2018-09-05 NOTE — ED Notes (Signed)
Patient refused ice pack

## 2018-09-05 NOTE — ED Notes (Signed)
Swelling and pulses noted to right foot/ankle.

## 2018-09-05 NOTE — ED Notes (Signed)
Contact number for Juliann Pulse to pick up daughter [phone].

## 2018-09-06 ENCOUNTER — Ambulatory Visit: Payer: Self-pay

## 2018-09-06 NOTE — Telephone Encounter (Signed)
Pt daughter called.  She states that yesterday her mother per triage was seen in  the ER.  She was Dx with a fx to her Rt foot. She refused pain medication because it causes nausea.  The foot was place in a boot and the patient was told to walk with a walker and put minimal pressure on the foot. Today her daughter states that her mother is in a lot of pain. She is requesting pain medication be ordered and sent for her. Per daughter the foot is elevated when her mother is not using it and the boot is in place. She states her mother has good circulation to her toes. She is icing as much as she can but with the boot it has been ineffective. Per protocol Daughter was instructed to maintain elevation, Ice, check circulation to toes loosen boot if more comfortable when resting. She need to return to ER if pain continues through the night. Use tylenol as prescribed. Daughter will call back if pain continues to be a problem.  She is requesting a call back from Dr Ronnald Ramp in the AM. Reason for Disposition . Leg pain  Answer Assessment - Initial Assessment Questions 1. ONSET: "When did the pain start?"     Rt foot 2. LOCATION: "Where is the pain located?"      bottom 3. PAIN: "How bad is the pain?"    (Scale 1-10; or mild, moderate, severe)   -  MILD (1-3): doesn't interfere with normal activities    -  MODERATE (4-7): interferes with normal activities (e.g., work or school) or awakens from sleep, limping    -  SEVERE (8-10): excruciating pain, unable to do any normal activities, unable to walk     With walker non weight baring 4. WORK OR EXERCISE: "Has there been any recent work or exercise that involved this part of the body?"      Injury broken bone in foot Dx  5. CAUSE: "What do you think is causing the leg pain?"     Fx foot 6. OTHER SYMPTOMS: "Do you have any other symptoms?" (e.g., chest pain, back pain, breathing difficulty, swelling, rash, fever, numbness, weakness)     no 7. PREGNANCY: "Is there  any chance you are pregnant?" "When was your last menstrual period?"     N/A  Protocols used: LEG PAIN-A-AH

## 2018-09-07 ENCOUNTER — Other Ambulatory Visit: Payer: Self-pay | Admitting: Internal Medicine

## 2018-09-07 ENCOUNTER — Ambulatory Visit: Payer: Medicare Other | Admitting: Internal Medicine

## 2018-09-07 DIAGNOSIS — S82891D Other fracture of right lower leg, subsequent encounter for closed fracture with routine healing: Secondary | ICD-10-CM

## 2018-09-07 MED ORDER — OXYCODONE HCL 5 MG PO TABS: 5.0000 mg | ORAL_TABLET | Freq: Four times a day (QID) | ORAL | 0 refills | Status: DC | PRN

## 2018-09-07 NOTE — Telephone Encounter (Signed)
Tried to call number listed for pt dtr. Both time line rang busy.

## 2018-09-07 NOTE — Telephone Encounter (Signed)
RX sent to [facility] pharmacy for oxycodone

## 2018-09-07 NOTE — Telephone Encounter (Signed)
Request for something stronger than Tylenol.   Recent fx of rt foot per CT:   IMPRESSION: 1. Mildly comminuted fracture of the dorsal peripheral anterolateral aspect of the calcaneus extending into the anterior process. 2. Subtle nondisplaced fracture of the peripheral anterolateral aspect of the talus adjacent to the talonavicular joint.   Please advise

## 2018-09-08 NOTE — Telephone Encounter (Signed)
Pt dtr Nevin Bloodgood informed rx was sent.

## 2018-09-22 ENCOUNTER — Other Ambulatory Visit: Payer: Self-pay | Admitting: Internal Medicine

## 2018-10-14 ENCOUNTER — Other Ambulatory Visit: Payer: Self-pay

## 2018-10-14 NOTE — Patient Outreach (Signed)
[place] [facility]) Care Management  10/14/2018  Nichole Silva 08-Nov-1934 [id number]   Medication Adherence call to Mrs. Nichole Silva HIPPA Compliant Voice message left with a call back number. Mrs. Nichole Silva is showing past due on Atorvastatin 40 mg under [place].   [place] Management Direct Dial [phone]  Fax [phone] [email or web address]

## 2018-10-21 ENCOUNTER — Encounter: Payer: Self-pay | Admitting: Internal Medicine

## 2018-10-24 NOTE — Telephone Encounter (Signed)
Called patient in response to her message she sent requesting SW. Nurse explained that a SW would not be able to assist with changing Durable POA documents. It was discussed that the patient would need to call the [employer] who created the documents for her and have them change the document to who she feels she can trust completely. Nurse also stated that she should go to all of the banks that have her daughter listed on the durable POA and request that they deny her daughter any access to her bank accounts. Patient verbalized understanding and was appreciative for the call-back.

## 2018-11-14 ENCOUNTER — Other Ambulatory Visit: Payer: Self-pay

## 2018-11-14 NOTE — Patient Outreach (Signed)
[place] [facility]) Care Management  11/14/2018  Coley Littles [birth year] [id number]   Medication Adherence call to Mrs. Rainy Garno [employer] Identifiers Verify spoke with patient she is past due on Atorvastatin 40 mg patient explain she has an appointment on Wednesday and will ask doctor if she should continued taking this medication.Mrs. Yeary is showing past due under [place].   [place] Management Direct Dial [phone]  Fax [phone] [email or web address]

## 2018-11-16 ENCOUNTER — Encounter: Payer: Self-pay | Admitting: Internal Medicine

## 2018-11-16 ENCOUNTER — Other Ambulatory Visit: Payer: Self-pay

## 2018-11-16 ENCOUNTER — Other Ambulatory Visit (INDEPENDENT_AMBULATORY_CARE_PROVIDER_SITE_OTHER): Payer: Medicare Other

## 2018-11-16 ENCOUNTER — Ambulatory Visit (INDEPENDENT_AMBULATORY_CARE_PROVIDER_SITE_OTHER): Payer: Medicare Other | Admitting: Internal Medicine

## 2018-11-16 VITALS — BP 124/72 | HR 96 | Temp 98.0°F | Resp 16 | Ht 64.0 in | Wt 135.0 lb

## 2018-11-16 DIAGNOSIS — E785 Hyperlipidemia, unspecified: Secondary | ICD-10-CM | POA: Diagnosis not present

## 2018-11-16 DIAGNOSIS — Z0000 Encounter for general adult medical examination without abnormal findings: Secondary | ICD-10-CM | POA: Diagnosis not present

## 2018-11-16 DIAGNOSIS — E118 Type 2 diabetes mellitus with unspecified complications: Secondary | ICD-10-CM | POA: Diagnosis not present

## 2018-11-16 DIAGNOSIS — I10 Essential (primary) hypertension: Secondary | ICD-10-CM | POA: Diagnosis not present

## 2018-11-16 DIAGNOSIS — E559 Vitamin D deficiency, unspecified: Secondary | ICD-10-CM | POA: Diagnosis not present

## 2018-11-16 DIAGNOSIS — F0390 Unspecified dementia without behavioral disturbance: Secondary | ICD-10-CM

## 2018-11-16 LAB — CBC WITH DIFFERENTIAL/PLATELET
Basophils Absolute: 0.1 10*3/uL (ref 0.0–0.1)
Basophils Relative: 0.5 % (ref 0.0–3.0)
Eosinophils Absolute: 0.1 10*3/uL (ref 0.0–0.7)
Eosinophils Relative: 1.1 % (ref 0.0–5.0)
HCT: 38.7 % (ref 36.0–46.0)
Hemoglobin: 12.9 g/dL (ref 12.0–15.0)
Lymphocytes Relative: 16.5 % (ref 12.0–46.0)
Lymphs Abs: 1.7 10*3/uL (ref 0.7–4.0)
MCHC: 33.4 g/dL (ref 30.0–36.0)
MCV: 95.9 fl (ref 78.0–100.0)
Monocytes Absolute: 0.7 10*3/uL (ref 0.1–1.0)
Monocytes Relative: 7.2 % (ref 3.0–12.0)
Neutro Abs: 7.5 10*3/uL (ref 1.4–7.7)
Neutrophils Relative %: 74.7 % (ref 43.0–77.0)
Platelets: 295 10*3/uL (ref 150.0–400.0)
RBC: 4.04 Mil/uL (ref 3.87–5.11)
RDW: 13.7 % (ref 11.5–15.5)
WBC: 10 10*3/uL (ref 4.0–10.5)

## 2018-11-16 LAB — COMPREHENSIVE METABOLIC PANEL
ALT: 13 U/L (ref 0–35)
AST: 17 U/L (ref 0–37)
Albumin: 4.4 g/dL (ref 3.5–5.2)
Alkaline Phosphatase: 67 U/L (ref 39–117)
BUN: 13 mg/dL (ref 6–23)
CO2: 28 mEq/L (ref 19–32)
Calcium: 10.2 mg/dL (ref 8.4–10.5)
Chloride: 102 mEq/L (ref 96–112)
Creatinine, Ser: 0.73 mg/dL (ref 0.40–1.20)
GFR: 76.34 mL/min (ref >60.00)
Glucose, Bld: 130 mg/dL — ABNORMAL HIGH (ref 70–99)
Potassium: 4.5 mEq/L (ref 3.5–5.1)
Sodium: 138 mEq/L (ref 135–145)
Total Bilirubin: 0.4 mg/dL (ref 0.2–1.2)
Total Protein: 7.7 g/dL (ref 6.0–8.3)

## 2018-11-16 LAB — LIPID PANEL
Cholesterol: 229 mg/dL — ABNORMAL HIGH (ref 0–200)
HDL: 57.5 mg/dL (ref >39.00)
LDL Cholesterol: 145 mg/dL — ABNORMAL HIGH (ref 0–99)
NonHDL: 171.22
Total CHOL/HDL Ratio: 4
Triglycerides: 131 mg/dL (ref 0.0–149.0)
VLDL: 26.2 mg/dL (ref 0.0–40.0)

## 2018-11-16 LAB — HEMOGLOBIN A1C: Hgb A1c MFr Bld: 6.1 % (ref 4.6–6.5)

## 2018-11-16 LAB — TSH: TSH: 1.74 u[IU]/mL (ref 0.35–4.50)

## 2018-11-16 LAB — VITAMIN D 25 HYDROXY (VIT D DEFICIENCY, FRACTURES): VITD: 76.53 ng/mL (ref 30.00–100.00)

## 2018-11-16 NOTE — Patient Instructions (Signed)

## 2018-11-16 NOTE — Progress Notes (Signed)
Subjective:  Patient ID: Nichole Silva, female    DOB: 17-May-1935  Age: 84 y.o. MRN: [id number]  CC: Hypertension, Hyperlipidemia, Diabetes, and Annual Exam   HPI Nichole Silva presents for f/up - It is hard to get a history from her but it sounds like she is not doing well at home.  She is losing weight without explanation.  She said she does not have much desire for food.  She said her IBS has not been symptomatic recently and she rarely has to take Imodium.  She denies nausea, vomiting, or paresthesias.  Outpatient Medications Prior to Visit  Medication Sig Dispense Refill   acetaminophen (TYLENOL) 325 MG tablet Take 325 mg by mouth every 6 (six) hours as needed for mild pain or moderate pain. Pain/headache     ALPRAZolam (XANAX) 0.5 MG tablet TAKE 1/2 TO 1 TABLET BY MOUTH 3 TIMES A DAY AS NEEDED (Patient taking differently: Take 0.5-1 mg by mouth 3 (three) times daily as needed for anxiety. ) 90 tablet 3   BREO ELLIPTA 200-25 MCG/INH AEPB TAKE 1 PUFF BY MOUTH EVERY DAY (Patient taking differently: Inhale 1 puff into the lungs daily. ) 60 each 5   cetirizine (ZYRTEC) 10 MG tablet Take 10 mg by mouth daily.       dicyclomine (BENTYL) 10 MG capsule TAKE ONE CAPSULE BY MOUTH 3 TIMES A DAY BEFORE MEALS (Patient taking differently: Take 10 mg by mouth 3 (three) times daily before meals. ) 270 capsule 2   fluticasone (FLONASE) 50 MCG/ACT nasal spray USE 2 SPRAYS IN EACH NOSTRIL AT BEDTIME (Patient taking differently: Place 2 sprays into both nostrils at bedtime. ) 48 g 1   furosemide (LASIX) 40 MG tablet TAKE 1 TO 2 TABLETS (40-80 MG TOTAL) BY MOUTH DAILY. (Patient taking differently: Take 40-80 mg by mouth daily. ) 170 tablet 0   losartan (COZAAR) 50 MG tablet TAKE 1 TABLET BY MOUTH EVERY DAY (Patient taking differently: Take 50 mg by mouth daily. ) 90 tablet 1   PROAIR HFA 108 (90 Base) MCG/ACT inhaler INHALE 1 TO 2 PUFFS BY MOUTH EVERY 4 HOURS AS NEEDED FOR WHEEZE OR SHORTNESS OF BREATH  8.5 Inhaler 11   Vitamin D, Ergocalciferol, (DRISDOL) 1.25 MG (50000 UT) CAPS capsule TAKE ONE CAPSULE BY MOUTH EVERY MONDAY (Patient taking differently: Take 50,000 Units by mouth every 7 (seven) days. ) 12 capsule 1   aspirin 81 MG tablet Take 81 mg by mouth daily.       atorvastatin (LIPITOR) 40 MG tablet Take 1 tablet (40 mg total) by mouth daily at 6 PM. 90 tablet 1   fluocinonide-emollient (LIDEX-E) 0.05 % cream Apply 1 application topically 2 (two) times daily. 60 g 2   NIFEdipine (PROCARDIA XL/NIFEDICAL XL) 60 MG 24 hr tablet TAKE 1 TABLET BY MOUTH EVERY DAY (Patient taking differently: Take 60 mg by mouth daily. ) 90 tablet 1   oxyCODONE (OXY IR/ROXICODONE) 5 MG immediate release tablet Take 1 tablet (5 mg total) by mouth every 6 (six) hours as needed for severe pain. 45 tablet 0   No facility-administered medications prior to visit.     ROS Review of Systems  Constitutional: Positive for activity change, appetite change and unexpected weight change. Negative for diaphoresis and fatigue.  HENT: Negative.  Negative for trouble swallowing and voice change.   Eyes: Negative for visual disturbance.  Respiratory: Negative for cough, chest tightness, shortness of breath and wheezing.   Cardiovascular: Negative for chest pain, palpitations  and leg swelling.  Gastrointestinal: Positive for diarrhea. Negative for abdominal pain, constipation, nausea and vomiting.  Endocrine: Negative.  Negative for cold intolerance.  Genitourinary: Negative.  Negative for difficulty urinating.  Musculoskeletal: Negative for arthralgias and myalgias.  Skin: Negative.  Negative for color change and rash.  Neurological: Negative.  Negative for dizziness, weakness and light-headedness.  Hematological: Negative for adenopathy. Does not bruise/bleed easily.  Psychiatric/Behavioral: Positive for confusion and decreased concentration. Negative for behavioral problems, sleep disturbance and suicidal ideas. The  patient is not nervous/anxious.     Objective:  BP 124/72 (BP Location: Left Arm, Patient Position: Sitting, Cuff Size: Normal)    Pulse 96    Temp 98 F (36.7 C) (Oral)    Resp 16    Ht 5\' 4"  (1.626 m)    Wt 135 lb (61.2 kg)    SpO2 96%    BMI 23.17 kg/m   BP Readings from Last 3 Encounters:  11/16/18 124/72  09/05/18 128/65  03/30/18 (!) 150/93    Wt Readings from Last 3 Encounters:  11/16/18 135 lb (61.2 kg)  09/05/18 140 lb (63.5 kg)  04/15/18 146 lb 4 oz (66.3 kg)    Physical Exam Vitals signs reviewed.  Constitutional:      Appearance: She is not ill-appearing or diaphoretic.  HENT:     Nose: Nose normal.     Mouth/Throat:     Mouth: Mucous membranes are moist.     Pharynx: Oropharynx is clear.  Eyes:     General: No scleral icterus.    Conjunctiva/sclera: Conjunctivae normal.  Neck:     Musculoskeletal: Normal range of motion. No neck rigidity.  Cardiovascular:     Rate and Rhythm: Normal rate and regular rhythm.     Heart sounds: No murmur. No gallop.   Pulmonary:     Effort: Pulmonary effort is normal.     Breath sounds: No stridor. No wheezing, rhonchi or rales.  Abdominal:     General: Abdomen is protuberant. Bowel sounds are normal. There is no distension.     Palpations: Abdomen is soft.     Tenderness: There is no abdominal tenderness.  Genitourinary:    Comments: Breast, GU, rectal exams were deferred at her request. Musculoskeletal: Normal range of motion.     Right lower leg: No edema.     Left lower leg: No edema.  Lymphadenopathy:     Cervical: No cervical adenopathy.  Skin:    General: Skin is warm and dry.     Coloration: Skin is not pale.     Findings: No erythema or rash.  Neurological:     General: No focal deficit present.     Mental Status: She is alert.  Psychiatric:        Attention and Perception: Perception normal. She is attentive.        Mood and Affect: Mood normal. Mood is not anxious.        Speech: Speech is delayed and  tangential. Speech is not slurred.        Behavior: Behavior is slowed and withdrawn. Behavior is cooperative.        Thought Content: Thought content normal. Thought content is not paranoid. Thought content does not include homicidal or suicidal ideation.        Cognition and Memory: Cognition is impaired. Memory is impaired. She exhibits impaired recent memory and impaired remote memory.        Judgment: Judgment normal.     Lab  Results  Component Value Date   WBC 10.0 11/16/2018   HGB 12.9 11/16/2018   HCT 38.7 11/16/2018   PLT 295.0 11/16/2018   GLUCOSE 130 (H) 11/16/2018   CHOL 229 (H) 11/16/2018   TRIG 131.0 11/16/2018   HDL 57.50 11/16/2018   LDLDIRECT 102.0 07/29/2015   LDLCALC 145 (H) 11/16/2018   ALT 13 11/16/2018   AST 17 11/16/2018   NA 138 11/16/2018   K 4.5 11/16/2018   CL 102 11/16/2018   CREATININE 0.73 11/16/2018   BUN 13 11/16/2018   CO2 28 11/16/2018   TSH 1.74 11/16/2018   HGBA1C 6.1 11/16/2018   MICROALBUR 18.2 (H) 11/17/2018    Dg Ankle Complete Right  Result Date: 09/05/2018 CLINICAL DATA:  84 year old female with right foot and ankle pain after a twisting injury suffered to 4 days previously EXAM: RIGHT ANKLE - COMPLETE 3+ VIEW COMPARISON:  Concurrently obtained radiographs of the foot 09/05/2018 FINDINGS: Diffuse soft tissue swelling is present about the ankle and dorsal forefoot. On the AP view, there are 2 small osseous fragments adjacent to the lateral talar neck which appear well corticated. Otherwise, no acute bony lesion or malalignment. No evidence of ankle joint effusion. No lytic or blastic osseous lesion. IMPRESSION: Osseous fragments adjacent to the lateral aspect of the talar neck appear well-corticated and are favored to be chronic. However, these could also represent small avulsion fractures at the ankle joint capsule insertion in the appropriate clinical setting. Diffuse soft tissue swelling about the ankle and dorsal forefoot. No evidence of  joint effusion. Electronically Signed   By: Jacqulynn Cadet M.D.   On: 09/05/2018 15:51   Ct Foot Right Wo Contrast  Result Date: 09/05/2018 CLINICAL DATA:  Foot trauma, pain EXAM: CT OF THE RIGHT FOOT WITHOUT CONTRAST TECHNIQUE: Multidetector CT imaging of the right foot was performed according to the standard protocol. Multiplanar CT image reconstructions were also generated. COMPARISON:  None. FINDINGS: Bones/Joint/Cartilage Mildly comminuted fracture of the dorsal peripheral anterolateral aspect of the calcaneus extending into the anterior process. Subtle nondisplaced fracture of the peripheral anterolateral aspect of the talus adjacent to the talonavicular joint. No other acute fracture or dislocation. No aggressive osseous lesion. Normal alignment. No joint effusion. Ligaments Ligaments are suboptimally evaluated by CT. Muscles and Tendons Muscles are normal. No muscle atrophy. Flexor, extensor, peroneal and Achilles tendons are grossly intact. Soft tissue No fluid collection or hematoma.  No soft tissue mass. IMPRESSION: 1. Mildly comminuted fracture of the dorsal peripheral anterolateral aspect of the calcaneus extending into the anterior process. 2. Subtle nondisplaced fracture of the peripheral anterolateral aspect of the talus adjacent to the talonavicular joint. Electronically Signed   By: Kathreen Devoid   On: 09/05/2018 17:55   Dg Foot Complete Right  Result Date: 09/05/2018 CLINICAL DATA:  Pain and bruising after a twisting injury to the foot. EXAM: RIGHT FOOT COMPLETE - 3+ VIEW COMPARISON:  None. FINDINGS: There is no evidence of fracture or dislocation. There is no evidence of arthropathy or other focal bone abnormality. Slight soft tissue prominence on the dorsum of the forefoot. IMPRESSION: No acute bone abnormality. Electronically Signed   By: Lorriane Shire M.D.   On: 09/05/2018 15:03    Assessment & Plan:   Marvene was seen today for hypertension, hyperlipidemia, diabetes and annual  exam.  Diagnoses and all orders for this visit:  Type II diabetes mellitus with manifestations ([place])- She has good glycemic control. -     HM Diabetes Foot Exam -  Comprehensive metabolic panel; Future -     Microalbumin / creatinine urine ratio; Future -     Hemoglobin A1c; Future -     Urinalysis, Routine w reflex microscopic; Future  Essential hypertension- Her blood pressure is adequately well controlled. -     Comprehensive metabolic panel; Future -     CBC with Differential/Platelet; Future -     TSH; Future -     Urinalysis, Routine w reflex microscopic; Future  Hyperlipidemia with target LDL less than 100- She has not achieved her LDL goal.  I have asked to restart the statin for cardiovascular risk reduction. -     Comprehensive metabolic panel; Future -     Lipid panel; Future -     TSH; Future -     atorvastatin (LIPITOR) 40 MG tablet; Take 1 tablet (40 mg total) by mouth daily at 6 PM.  Vitamin D deficiency -     VITAMIN D 25 Hydroxy (Vit-D Deficiency, Fractures); Future  Dementia without behavioral disturbance, unspecified dementia type ([place])- Tt does look like she is compliant with meds.  She may not be getting enough food.  I think she would benefit from a hospice home health care team. -     Ambulatory referral to Hospice   I have discontinued [place] Savoia's aspirin, fluocinonide-emollient, NIFEdipine, and oxyCODONE. I am also having her maintain her cetirizine, acetaminophen, ProAir HFA, fluticasone, losartan, Vitamin D (Ergocalciferol), dicyclomine, furosemide, Breo Ellipta, ALPRAZolam, and atorvastatin.  Meds ordered this encounter  Medications   atorvastatin (LIPITOR) 40 MG tablet    Sig: Take 1 tablet (40 mg total) by mouth daily at 6 PM.    Dispense:  90 tablet    Refill:  1     Follow-up: Return in about 6 months (around 05/18/2019).  Scarlette Calico, MD

## 2018-11-17 ENCOUNTER — Encounter: Payer: Self-pay | Admitting: Internal Medicine

## 2018-11-17 ENCOUNTER — Other Ambulatory Visit (INDEPENDENT_AMBULATORY_CARE_PROVIDER_SITE_OTHER): Payer: Medicare Other

## 2018-11-17 DIAGNOSIS — I10 Essential (primary) hypertension: Secondary | ICD-10-CM | POA: Diagnosis not present

## 2018-11-17 DIAGNOSIS — E118 Type 2 diabetes mellitus with unspecified complications: Secondary | ICD-10-CM

## 2018-11-17 DIAGNOSIS — F0390 Unspecified dementia without behavioral disturbance: Secondary | ICD-10-CM | POA: Insufficient documentation

## 2018-11-17 LAB — URINALYSIS, ROUTINE W REFLEX MICROSCOPIC
Bilirubin Urine: NEGATIVE
Ketones, ur: NEGATIVE
Nitrite: NEGATIVE
Specific Gravity, Urine: 1.015 (ref 1.000–1.030)
Total Protein, Urine: 30 — AB
Urine Glucose: NEGATIVE
Urobilinogen, UA: 0.2 (ref 0.0–1.0)
pH: 8 (ref 5.0–8.0)

## 2018-11-17 LAB — MICROALBUMIN / CREATININE URINE RATIO
Creatinine,U: 88.7 mg/dL
Microalb Creat Ratio: 20.5 mg/g (ref 0.0–30.0)
Microalb, Ur: 18.2 mg/dL — ABNORMAL HIGH (ref 0.0–1.9)

## 2018-11-17 MED ORDER — ATORVASTATIN CALCIUM 40 MG PO TABS: 40.0000 mg | ORAL_TABLET | Freq: Every day | ORAL | 1 refills | Status: DC

## 2018-11-17 NOTE — Assessment & Plan Note (Signed)
Exam completed Labs reviewed Vaccines reviewed No cancer screening initiatives are indicated

## 2018-11-22 DIAGNOSIS — Z960 Presence of urogenital implants: Secondary | ICD-10-CM | POA: Diagnosis not present

## 2018-11-22 DIAGNOSIS — N819 Female genital prolapse, unspecified: Secondary | ICD-10-CM | POA: Diagnosis not present

## 2018-11-24 ENCOUNTER — Other Ambulatory Visit: Payer: Self-pay | Admitting: Internal Medicine

## 2018-11-30 ENCOUNTER — Other Ambulatory Visit: Payer: Self-pay | Admitting: Internal Medicine

## 2018-11-30 DIAGNOSIS — IMO0002 Reserved for concepts with insufficient information to code with codable children: Secondary | ICD-10-CM

## 2018-11-30 DIAGNOSIS — I10 Essential (primary) hypertension: Secondary | ICD-10-CM

## 2018-11-30 DIAGNOSIS — E1165 Type 2 diabetes mellitus with hyperglycemia: Secondary | ICD-10-CM

## 2018-12-05 ENCOUNTER — Telehealth: Payer: Self-pay

## 2018-12-05 NOTE — Telephone Encounter (Signed)
Spoke to pt dtr Juliann Pulse - She wanted to know if there is a medication to help with her dementia.

## 2018-12-05 NOTE — Telephone Encounter (Signed)
Copied from [place] [id number]. Topic: General - Other >> Dec 02, 2018  3:34 PM Leward Quan A wrote: Reason for CRM: Patient daughter Elvera Lennox called to ask if Dr Ronnald Ramp can give her a call regarding the referral that was made to [facility]. Would like a call back preferably before 12/05/2018 pm  Please call her at Ph# [phone]

## 2018-12-05 NOTE — Telephone Encounter (Signed)
I ordered a hospice referral to help her manage her symptoms, her husband, and her medical concerns.  It wants more of a palliative care referral.  TJ

## 2018-12-05 NOTE — Telephone Encounter (Signed)
Daughter did come into the office today. She would like to know what the outcome of patients last appt was stating that the patient does not remember nor knows how she got to the appt.  States that hospice called her stating that Dr. Ronnald Ramp had declared patient not competent to make decisions and that daughter was listed as contact since she was POA.  Daughter would like to know if Dr. Ronnald Ramp came to this conclusion at patients last OV? Daughter states she does believe patient needs assistance but states that patient also does not like Hospice.  Daughter wants to make sure she is discussing the whole situation clearly with the patient.

## 2018-12-06 ENCOUNTER — Encounter: Payer: Self-pay | Admitting: Internal Medicine

## 2018-12-08 ENCOUNTER — Ambulatory Visit: Payer: Medicare Other | Admitting: Internal Medicine

## 2018-12-09 ENCOUNTER — Telehealth: Payer: Self-pay | Admitting: Internal Medicine

## 2018-12-09 NOTE — Telephone Encounter (Signed)
FYI:  Daughter dropped off [facility] from [place].  I have scanned in to [place].

## 2018-12-25 ENCOUNTER — Telehealth: Payer: Self-pay | Admitting: Internal Medicine

## 2018-12-30 ENCOUNTER — Other Ambulatory Visit: Payer: Self-pay | Admitting: Internal Medicine

## 2019-01-02 ENCOUNTER — Other Ambulatory Visit: Payer: Self-pay | Admitting: Internal Medicine

## 2019-01-04 NOTE — Telephone Encounter (Signed)
Medication Refill - Medication:  NIFEdipine 60 MG   Has the patient contacted their pharmacy? Yes.   (Agent: If no, request that the patient contact the pharmacy for the refill.) (Agent: If yes, when and what did the pharmacy advise?)  Preferred Pharmacy (with phone number or street name):  [facility], [place] [phone] (Phone) [phone] (Fax)     Agent: Please be advised that RX refills may take up to 3 business days. We ask that you follow-up with your pharmacy.

## 2019-01-05 ENCOUNTER — Encounter: Payer: Self-pay | Admitting: Internal Medicine

## 2019-01-05 ENCOUNTER — Other Ambulatory Visit: Payer: Self-pay | Admitting: Internal Medicine

## 2019-01-05 DIAGNOSIS — I2510 Atherosclerotic heart disease of native coronary artery without angina pectoris: Secondary | ICD-10-CM

## 2019-01-05 DIAGNOSIS — I10 Essential (primary) hypertension: Secondary | ICD-10-CM

## 2019-01-05 MED ORDER — NIFEDIPINE ER OSMOTIC RELEASE 60 MG PO TB24: 60.0000 mg | ORAL_TABLET | Freq: Every day | ORAL | 1 refills | Status: DC

## 2019-01-05 NOTE — Telephone Encounter (Signed)
Pt informed via detailed vm.

## 2019-01-26 ENCOUNTER — Other Ambulatory Visit: Payer: Self-pay | Admitting: Internal Medicine

## 2019-01-26 DIAGNOSIS — J4540 Moderate persistent asthma, uncomplicated: Secondary | ICD-10-CM

## 2019-01-30 ENCOUNTER — Other Ambulatory Visit: Payer: Self-pay | Admitting: Internal Medicine

## 2019-01-31 ENCOUNTER — Other Ambulatory Visit: Payer: Self-pay | Admitting: Internal Medicine

## 2019-02-03 DIAGNOSIS — H52223 Regular astigmatism, bilateral: Secondary | ICD-10-CM | POA: Diagnosis not present

## 2019-02-03 DIAGNOSIS — H2512 Age-related nuclear cataract, left eye: Secondary | ICD-10-CM | POA: Diagnosis not present

## 2019-02-03 DIAGNOSIS — H5212 Myopia, left eye: Secondary | ICD-10-CM | POA: Diagnosis not present

## 2019-02-03 DIAGNOSIS — H524 Presbyopia: Secondary | ICD-10-CM | POA: Diagnosis not present

## 2019-02-09 ENCOUNTER — Other Ambulatory Visit: Payer: Self-pay

## 2019-02-09 ENCOUNTER — Ambulatory Visit (INDEPENDENT_AMBULATORY_CARE_PROVIDER_SITE_OTHER): Payer: Medicare Other | Admitting: *Deleted

## 2019-02-09 ENCOUNTER — Ambulatory Visit: Payer: Medicare Other

## 2019-02-09 VITALS — BP 128/70 | HR 114 | Resp 18 | Ht 64.0 in | Wt 138.0 lb

## 2019-02-09 DIAGNOSIS — Z0000 Encounter for general adult medical examination without abnormal findings: Secondary | ICD-10-CM | POA: Diagnosis not present

## 2019-02-09 DIAGNOSIS — Z23 Encounter for immunization: Secondary | ICD-10-CM

## 2019-02-09 NOTE — Patient Instructions (Addendum)
Continue doing brain stimulating activities (puzzles, reading, adult coloring books, staying active) to keep memory sharp.   Continue to eat heart healthy diet (full of fruits, vegetables, whole grains, lean protein, water--limit salt, fat, and sugar intake) and increase physical activity as tolerated.   Nichole Silva , Thank you for taking time to come for your Medicare Wellness Visit. I appreciate your ongoing commitment to your health goals. Please review the following plan we discussed and let me know if I can assist you in the future.   These are the goals we discussed: Goals    . Patient Stated     Stay as healthy, active and as independent as possible.       This is a list of the screening recommended for you and due dates:  Health Maintenance  Topic Date Due  . Eye exam for diabetics  09/03/2016  . Flu Shot  12/24/2018  . Hemoglobin A1C  05/18/2019  . Complete foot exam   11/16/2019  . Tetanus Vaccine  09/15/2023  . DEXA scan (bone density measurement)  Completed  . Pneumonia vaccines  Completed    Preventive Care 24 Years and Older, Female Preventive care refers to lifestyle choices and visits with your health care provider that can promote health and wellness. This includes:  A yearly physical exam. This is also called an annual well check.  Regular dental and eye exams.  Immunizations.  Screening for certain conditions.  Healthy lifestyle choices, such as diet and exercise. What can I expect for my preventive care visit? Physical exam Your health care provider will check:  Height and weight. These may be used to calculate body mass index (BMI), which is a measurement that tells if you are at a healthy weight.  Heart rate and blood pressure.  Your skin for abnormal spots. Counseling Your health care provider may ask you questions about:  Alcohol, tobacco, and drug use.  Emotional well-being.  Home and relationship well-being.  Sexual activity.  Eating  habits.  History of falls.  Memory and ability to understand (cognition).  Work and work Statistician.  Pregnancy and menstrual history. What immunizations do I need?  Influenza (flu) vaccine  This is recommended every year. Tetanus, diphtheria, and pertussis (Tdap) vaccine  You may need a Td booster every 10 years. Varicella (chickenpox) vaccine  You may need this vaccine if you have not already been vaccinated. Zoster (shingles) vaccine  You may need this after age 71. Pneumococcal conjugate (PCV13) vaccine  One dose is recommended after age 34. Pneumococcal polysaccharide (PPSV23) vaccine  One dose is recommended after age 45. Measles, mumps, and rubella (MMR) vaccine  You may need at least one dose of MMR if you were born in 1957 or later. You may also need a second dose. Meningococcal conjugate (MenACWY) vaccine  You may need this if you have certain conditions. Hepatitis A vaccine  You may need this if you have certain conditions or if you travel or work in places where you may be exposed to hepatitis A. Hepatitis B vaccine  You may need this if you have certain conditions or if you travel or work in places where you may be exposed to hepatitis B. Haemophilus influenzae type b (Hib) vaccine  You may need this if you have certain conditions. You may receive vaccines as individual doses or as more than one vaccine together in one shot (combination vaccines). Talk with your health care provider about the risks and benefits of combination vaccines.  What tests do I need? Blood tests  Lipid and cholesterol levels. These may be checked every 5 years, or more frequently depending on your overall health.  Hepatitis C test.  Hepatitis B test. Screening  Lung cancer screening. You may have this screening every year starting at age 30 if you have a 30-pack-year history of smoking and currently smoke or have quit within the past 15 years.  Colorectal cancer screening.  All adults should have this screening starting at age 1 and continuing until age 29. Your health care provider may recommend screening at age 25 if you are at increased risk. You will have tests every 1-10 years, depending on your results and the type of screening test.  Diabetes screening. This is done by checking your blood sugar (glucose) after you have not eaten for a while (fasting). You may have this done every 1-3 years.  Mammogram. This may be done every 1-2 years. Talk with your health care provider about how often you should have regular mammograms.  BRCA-related cancer screening. This may be done if you have a family history of breast, ovarian, tubal, or peritoneal cancers. Other tests  Sexually transmitted disease (STD) testing.  Bone density scan. This is done to screen for osteoporosis. You may have this done starting at age 31. Follow these instructions at home: Eating and drinking  Eat a diet that includes fresh fruits and vegetables, whole grains, lean protein, and low-fat dairy products. Limit your intake of foods with high amounts of sugar, saturated fats, and salt.  Take vitamin and mineral supplements as recommended by your health care provider.  Do not drink alcohol if your health care provider tells you not to drink.  If you drink alcohol: ? Limit how much you have to 0-1 drink a day. ? Be aware of how much alcohol is in your drink. In the [place] one drink equals one 12 oz bottle of beer (355 mL), one 5 oz glass of Adeel Guiffre (148 mL), or one 1 oz glass of hard liquor (44 mL). Lifestyle  Take daily care of your teeth and gums.  Stay active. Exercise for at least 30 minutes on 5 or more days each week.  Do not use any products that contain nicotine or tobacco, such as cigarettes, e-cigarettes, and chewing tobacco. If you need help quitting, ask your health care provider.  If you are sexually active, practice safe sex. Use a condom or other form of protection in order  to prevent STIs (sexually transmitted infections).  Talk with your health care provider about taking a low-dose aspirin or statin. What's next?  Go to your health care provider once a year for a well check visit.  Ask your health care provider how often you should have your eyes and teeth checked.  Stay up to date on all vaccines. This information is not intended to replace advice given to you by your health care provider. Make sure you discuss any questions you have with your health care provider. Document Released: 06/07/2015 Document Revised: 05/05/2018 Document Reviewed: 05/05/2018 Elsevier Patient Education  2020 [employer].

## 2019-02-09 NOTE — Progress Notes (Addendum)
Subjective:   Nichole Silva is a 84 y.o. female who presents for Medicare Annual (Subsequent) preventive examination.  Review of Systems:   Cardiac Risk Factors include: advanced age (>65men, >65 women) Sleep patterns: feels rested on waking and sleeps 7-8 hours nightly.    Home Safety/Smoke Alarms: Feels safe in home. Smoke alarms in place.  Living environment; residence and Firearm Safety: 1-story house/ trailer, number of inside stairs: 2 sets of stairs with 3-4 steps , equipment: Walkers, Type: Conservation officer, nature. Lives with husband and daughter, no needs for DME, good support system Seat Belt Safety/Bike Helmet: Wears seat belt.       Objective:     Vitals: BP 128/70   Pulse (!) 114   Resp 18   Ht 5\' 4"  (1.626 m)   Wt 138 lb (62.6 kg)   SpO2 98%   BMI 23.69 kg/m   Body mass index is 23.69 kg/m.  Advanced Directives 02/09/2019 09/05/2018 05/26/2016 02/02/2016  Does Patient Have a Medical Advance Directive? Yes Yes Yes No;Yes  Type of Paramedic of [place];Living will [place];Living will Healthcare Power of [place];Living will  Does patient want to make changes to medical advance directive? - - - No - Patient declined  Copy of [place] in Chart? No - copy requested - - Yes    Tobacco Social History   Tobacco Use  Smoking Status Never Smoker  Smokeless Tobacco Never Used     Counseling given: Not Answered  Past Medical History:  Diagnosis Date  . Allergic rhinitis, cause unspecified   . Anxiety state, unspecified   . Asthma   . Coronary atherosclerosis of unspecified type of vessel, native or graft   . Depression   . Female bladder prolapse   . Hyperlipidemia   . IBS (irritable bowel syndrome)   . Osteoarthrosis, unspecified whether generalized or localized, unspecified site   . Type II or unspecified type diabetes mellitus without mention of complication, not stated as  uncontrolled   . Unspecified chronic bronchitis ([place])   . Unspecified essential hypertension   . Unspecified hearing loss   . Unspecified venous (peripheral) insufficiency   . Unspecified vitamin D deficiency    Past Surgical History:  Procedure Laterality Date  . Cataract surg  2009  . CHOLECYSTECTOMY  1997  . DILATION AND CURETTAGE OF UTERUS  1960's  . Left elbow surgery  1992   Family History  Problem Relation Age of Onset  . Emphysema Mother   . Heart disease Father   . Colon cancer Sister   . Colon cancer Other   . Pancreatic cancer Other    Social History   Socioeconomic History  . Marital status: Married    Spouse name: Kaytie Wiitala  . Number of children: 3  . Years of education: Not on file  . Highest education level: Not on file  Occupational History  . Occupation: Retired    Fish farm manager: [employer] [place]  . Financial resource strain: Not hard at all  . Food insecurity    Worry: Never true    Inability: Never true  . Transportation needs    Medical: No    Non-medical: No  Tobacco Use  . Smoking status: Never Smoker  . Smokeless tobacco: Never Used  Substance and Sexual Activity  . Alcohol use: No    Alcohol/week: 0.0 standard drinks  . Drug use: No  . Sexual activity:  Never  Lifestyle  . Physical activity    Days per week: 0 days    Minutes per session: 0 min  . Stress: To some extent  Relationships  . Social connections    Talks on phone: More than three times a week    Gets together: More than three times a week    Attends religious service: More than 4 times per year    Active member of club or organization: Yes    Attends meetings of clubs or organizations: More than 4 times per year    Relationship status: Married  Other Topics Concern  . Not on file  Social History Narrative  . Not on file    Outpatient Encounter Medications as of 02/09/2019  Medication Sig  . acetaminophen (TYLENOL) 325 MG tablet Take 325 mg by  mouth every 6 (six) hours as needed for mild pain or moderate pain. Pain/headache  . ALPRAZolam (XANAX) 0.5 MG tablet Take 1-2 tablets (0.5-1 mg total) by mouth 3 (three) times daily as needed for anxiety.  Marland Kitchen atorvastatin (LIPITOR) 40 MG tablet Take 1 tablet (40 mg total) by mouth daily at 6 PM.  . BREO ELLIPTA 200-25 MCG/INH AEPB INHALE 1 PUFF BY MOUTH EVERY DAY  . cetirizine (ZYRTEC) 10 MG tablet Take 10 mg by mouth daily.    Marland Kitchen dicyclomine (BENTYL) 10 MG capsule TAKE ONE CAPSULE BY MOUTH 3 TIMES A DAY BEFORE MEALS (Patient taking differently: Take 10 mg by mouth 3 (three) times daily before meals. )  . fluticasone (FLONASE) 50 MCG/ACT nasal spray Place 2 sprays into both nostrils at bedtime.  . furosemide (LASIX) 40 MG tablet TAKE 1 TO 2 TABLETS (40-80 MG TOTAL) BY MOUTH DAILY. (Patient taking differently: Take 40-80 mg by mouth daily. )  . losartan (COZAAR) 50 MG tablet Take 1 tablet (50 mg total) by mouth daily.  Marland Kitchen NIFEdipine (NIFEDICAL XL) 60 MG 24 hr tablet Take 1 tablet (60 mg total) by mouth daily.  Marland Kitchen PROAIR HFA 108 (90 Base) MCG/ACT inhaler INHALE 1 TO 2 PUFFS BY MOUTH EVERY 4 HOURS AS NEEDED FOR WHEEZE OR SHORTNESS OF BREATH  . Vitamin D, Ergocalciferol, (DRISDOL) 1.25 MG (50000 UT) CAPS capsule Take 1 capsule (50,000 Units total) by mouth every 7 (seven) days.   No facility-administered encounter medications on file as of 02/09/2019.     Activities of Daily Living In your present state of health, do you have any difficulty performing the following activities: 02/09/2019  Hearing? Y  Vision? N  Difficulty concentrating or making decisions? Y  Walking or climbing stairs? N  Dressing or bathing? N  Doing errands, shopping? N  Preparing Food and eating ? N  Using the Toilet? N  In the past six months, have you accidently leaked urine? Y  Do you have problems with loss of bowel control? Y  Managing your Medications? N  Managing your Finances? N  Housekeeping or managing your  Housekeeping? N  Some recent data might be hidden    Patient Care Team: Janith Lima, MD as PCP - General (Internal Medicine)    Assessment:   This is a routine wellness examination for Nichole Silva. Physical assessment deferred to PCP.   Exercise Activities and Dietary recommendations Current Exercise Habits: The patient does not participate in regular exercise at present, Exercise limited by: orthopedic condition(s)  Diet (meal preparation, eat out, water intake, caffeinated beverages, dairy products, fruits and vegetables): in general, a "healthy" diet   Patient reports good  appetite.   Reviewed heart healthy and diabetic diet. Encouraged patient to increase daily water and healthy fluid intake. Discussed supplementing with Ensure or Boost to maintain weight.   Goals    . Patient Stated     Stay as healthy, active and as independent as possible.       Fall Risk Fall Risk  02/09/2019 11/16/2018 03/08/2017 03/08/2017 02/02/2016  Falls in the past year? 0 1 Yes Yes No  Number falls in past yr: 1 1 1 1  -  Injury with Fall? 1 1 Yes Yes -  Risk for fall due to : Impaired balance/gait;Impaired mobility Impaired balance/gait;Impaired mobility - - -  Follow up Falls prevention discussed;Education provided Falls evaluation completed;Education provided Falls evaluation completed - -    Depression Screen PHQ 2/9 Scores 02/09/2019 11/16/2018 03/08/2017 02/02/2016  PHQ - 2 Score 1 0 0 0  PHQ- 9 Score 2 2 - -     Cognitive Function     6CIT Screen 02/09/2019  What Year? 0 points  What month? 0 points  What time? 0 points  Count back from 20 4 points  Months in reverse 4 points  Repeat phrase 2 points  Total Score 10    Immunization History  Administered Date(s) Administered  . Fluad Quad(high Dose 65+) 02/09/2019  . Influenza Split 03/18/2011, 02/23/2012  . Influenza Whole 02/25/2009, 02/24/2010  . Influenza, High Dose Seasonal PF 01/30/2016, 03/08/2017, 03/08/2018  .  Influenza,inj,Quad PF,6+ Mos 03/29/2013, 04/09/2014, 07/29/2015  . Pneumococcal Conjugate-13 09/14/2013  . Pneumococcal Polysaccharide-23 05/11/2014  . Tdap 09/14/2013  . Zoster 05/11/2014   Screening Tests Health Maintenance  Topic Date Due  . OPHTHALMOLOGY EXAM  09/03/2016  . HEMOGLOBIN A1C  05/18/2019  . FOOT EXAM  11/16/2019  . TETANUS/TDAP  09/15/2023  . INFLUENZA VACCINE  Completed  . DEXA SCAN  Completed  . PNA vac Low Risk Adult  Completed      Plan:    Reviewed health maintenance screenings with patient today and relevant education, vaccines, and/or referrals were provided.   I have personally reviewed and noted the following in the patient's chart:   . Medical and social history . Use of alcohol, tobacco or illicit drugs  . Current medications and supplements . Functional ability and status . Nutritional status . Physical activity . Advanced directives . List of other physicians . Vitals . Screenings to include cognitive, depression, and falls . Referrals and appointments  In addition, I have reviewed and discussed with patient certain preventive protocols, quality metrics, and best practice recommendations. A written personalized care plan for preventive services as well as general preventive health recommendations were provided to patient.     Michiel Cowboy, RN  02/09/2019   Medical screening examination/treatment/procedure(s) were performed by non-physician practitioner and as supervising physician I was immediately available for consultation/collaboration. I agree with above. Scarlette Calico, MD

## 2019-02-28 DIAGNOSIS — N819 Female genital prolapse, unspecified: Secondary | ICD-10-CM | POA: Diagnosis not present

## 2019-02-28 DIAGNOSIS — Z960 Presence of urogenital implants: Secondary | ICD-10-CM | POA: Diagnosis not present

## 2019-03-02 DIAGNOSIS — H2512 Age-related nuclear cataract, left eye: Secondary | ICD-10-CM | POA: Diagnosis not present

## 2019-03-02 DIAGNOSIS — Z01818 Encounter for other preprocedural examination: Secondary | ICD-10-CM | POA: Diagnosis not present

## 2019-03-27 ENCOUNTER — Other Ambulatory Visit: Payer: Self-pay | Admitting: Internal Medicine

## 2019-03-27 DIAGNOSIS — I10 Essential (primary) hypertension: Secondary | ICD-10-CM

## 2019-03-27 DIAGNOSIS — IMO0002 Reserved for concepts with insufficient information to code with codable children: Secondary | ICD-10-CM

## 2019-03-27 DIAGNOSIS — E1165 Type 2 diabetes mellitus with hyperglycemia: Secondary | ICD-10-CM

## 2019-03-31 ENCOUNTER — Other Ambulatory Visit: Payer: Self-pay | Admitting: Internal Medicine

## 2019-04-03 ENCOUNTER — Telehealth: Payer: Self-pay | Admitting: Internal Medicine

## 2019-04-03 NOTE — Telephone Encounter (Signed)
AccessNurse Call: 04/02/2019 at 11:10pm -   Patient's daughter called stating that for the last 3 days her mother has felt fatigued and sick. She is complaining of a sore throat with a temperature of 100.1 and then 100.8. She was scheduled for cataract surgery tomorrow. RN was unable to reach the on call doctor. Patient's daughter states that they have canceled the procedure for tomorrow and will back about testing tomorrow. She did say that if the patient got worse, they will take her to the ED.  Called the patient and left message for her to call back regarding.

## 2019-04-05 ENCOUNTER — Emergency Department (HOSPITAL_COMMUNITY): Payer: Medicare Other

## 2019-04-05 ENCOUNTER — Other Ambulatory Visit: Payer: Self-pay

## 2019-04-05 ENCOUNTER — Emergency Department (HOSPITAL_COMMUNITY)
Admission: EM | Admit: 2019-04-05 | Discharge: 2019-04-05 | Disposition: A | Discharge status: Home / Self Care | Payer: Medicare Other | Attending: Emergency Medicine | Admitting: Emergency Medicine

## 2019-04-05 ENCOUNTER — Encounter (HOSPITAL_COMMUNITY): Payer: Self-pay | Admitting: *Deleted

## 2019-04-05 DIAGNOSIS — Z743 Need for continuous supervision: Secondary | ICD-10-CM | POA: Diagnosis not present

## 2019-04-05 DIAGNOSIS — I2510 Atherosclerotic heart disease of native coronary artery without angina pectoris: Secondary | ICD-10-CM | POA: Insufficient documentation

## 2019-04-05 DIAGNOSIS — Z20822 Contact with and (suspected) exposure to covid-19: Secondary | ICD-10-CM

## 2019-04-05 DIAGNOSIS — R070 Pain in throat: Secondary | ICD-10-CM | POA: Diagnosis not present

## 2019-04-05 DIAGNOSIS — F0390 Unspecified dementia without behavioral disturbance: Secondary | ICD-10-CM | POA: Diagnosis not present

## 2019-04-05 DIAGNOSIS — R05 Cough: Secondary | ICD-10-CM | POA: Diagnosis not present

## 2019-04-05 DIAGNOSIS — U071 COVID-19: Secondary | ICD-10-CM | POA: Insufficient documentation

## 2019-04-05 DIAGNOSIS — E119 Type 2 diabetes mellitus without complications: Secondary | ICD-10-CM | POA: Insufficient documentation

## 2019-04-05 DIAGNOSIS — Z79899 Other long term (current) drug therapy: Secondary | ICD-10-CM | POA: Diagnosis not present

## 2019-04-05 DIAGNOSIS — I10 Essential (primary) hypertension: Secondary | ICD-10-CM | POA: Insufficient documentation

## 2019-04-05 DIAGNOSIS — R509 Fever, unspecified: Secondary | ICD-10-CM | POA: Diagnosis not present

## 2019-04-05 LAB — SARS CORONAVIRUS 2 (TAT 6-24 HRS): SARS Coronavirus 2: POSITIVE — AB

## 2019-04-05 IMAGING — DX DG CHEST 1V PORT
1 series · 1 of 1 positions shown · non-contrast
Comparison: None.

CLINICAL DATA: Cough.  Recent exposure to COVID

EXAM:
PORTABLE CHEST 1 VIEW

[chest ap]
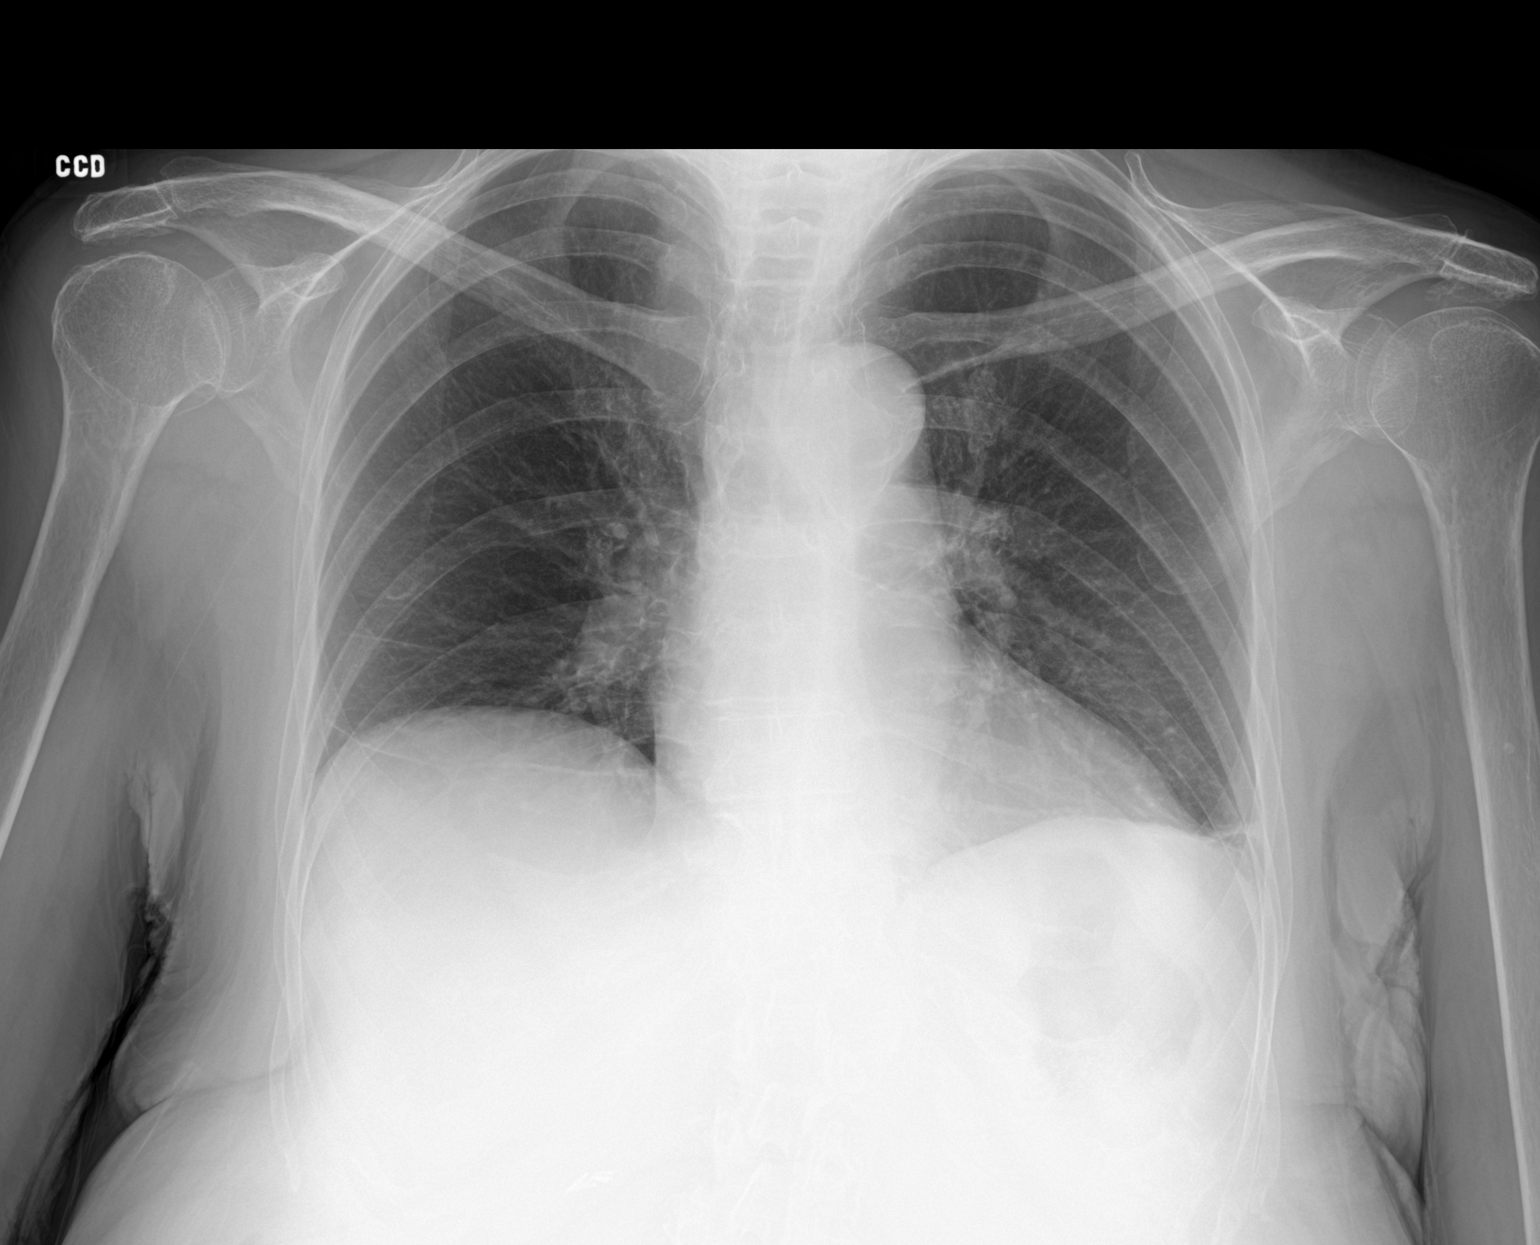

[1 of 1 positions shown; findings below may reference images not displayed]

FINDINGS: The heart size and mediastinal contours are within normal limits.
Both lungs are clear. The visualized skeletal structures are
unremarkable.
IMPRESSION: No active disease.

## 2019-04-05 NOTE — ED Provider Notes (Signed)
[facility] DEPT Provider Note   CSN: [id number] Arrival date & time: 04/05/19  1156     History   Chief Complaint Chief Complaint  Patient presents with  . Fever  . Cough    Covid exposure    HPI Nichole Silva is a 84 y.o. female.     Nichole Silva is a 84 y.o. female with a history of IBS, diabetes, hypertension, chronic bronchitis, CAD, who presents to the emergency department via EMS for Covid testing.  Patient reports that her husband recently tested positive for Covid and is currently hospitalized at the [facility] due to this as well as thyroid issues.  She reports that yesterday she was having slight fevers, T-max of 100.8, these responded well to Tylenol and temp has only been 99 today.  She reports she has been having some mild body aches and today started having a runny nose and an occasional nonproductive cough.  She denies any chest pain or shortness of breath.  No dyspnea on exertion.  She denies any abdominal pain, nausea or vomiting.  She has some diarrhea at baseline due to IBS but this is no worse than usual.  Her daughter sent her here with concern due to her recent Covid exposure from her husband.  No other known sick contacts.     Past Medical History:  Diagnosis Date  . Allergic rhinitis, cause unspecified   . Anxiety state, unspecified   . Asthma   . Coronary atherosclerosis of unspecified type of vessel, native or graft   . Depression   . Female bladder prolapse   . Hyperlipidemia   . IBS (irritable bowel syndrome)   . Osteoarthrosis, unspecified whether generalized or localized, unspecified site   . Type II or unspecified type diabetes mellitus without mention of complication, not stated as uncontrolled   . Unspecified chronic bronchitis ([place])   . Unspecified essential hypertension   . Unspecified hearing loss   . Unspecified venous (peripheral) insufficiency   . Unspecified vitamin D deficiency     Patient Active Problem  List   Diagnosis Date Noted  . Dementia without behavioral disturbance ([place]) 11/17/2018  . Estrogen deficiency 03/08/2017  . Age-related osteoporosis with current pathological fracture 03/08/2017  . Eczema 05/28/2016  . Asthma, moderate persistent 07/29/2015  . IBS (irritable bowel syndrome) 01/30/2015  . Pessary maintenance 10/25/2013  . Cystocele with uterine prolapse 10/25/2013  . Anxiety 10/06/2013  . Routine general medical examination at a health care facility 09/14/2013  . Vitamin D deficiency 02/25/2009  . DEGENERATIVE JOINT DISEASE 02/27/2008  . Type II diabetes mellitus with manifestations ([place]) 09/02/2007  . Hyperlipidemia with target LDL less than 100 07/05/2007  . Essential hypertension 07/05/2007  . Coronary atherosclerosis 07/05/2007    Past Surgical History:  Procedure Laterality Date  . Cataract surg  2009  . CHOLECYSTECTOMY  1997  . DILATION AND CURETTAGE OF UTERUS  1960's  . Left elbow surgery  1992     OB History    Gravida  6   Para  4   Term  3   Preterm  1   AB  2   Living  4     SAB  2   TAB      Ectopic      Multiple      Live Births  4            Home Medications    Prior to Admission medications   Medication Sig Start  Date End Date Taking? Authorizing Provider  acetaminophen (TYLENOL) 325 MG tablet Take 325 mg by mouth every 6 (six) hours as needed for mild pain or moderate pain. Pain/headache    [provider]  ALPRAZolam (XANAX) 0.5 MG tablet TAKE 1-2 TABLETS (0.5-1 MG TOTAL) BY MOUTH 3 (THREE) TIMES DAILY AS NEEDED FOR ANXIETY. 03/31/19   Janith Lima, MD  atorvastatin (LIPITOR) 40 MG tablet Take 1 tablet (40 mg total) by mouth daily at 6 PM. 11/17/18   Janith Lima, MD  BREO ELLIPTA 200-25 MCG/INH AEPB INHALE 1 PUFF BY MOUTH EVERY DAY 01/26/19   Janith Lima, MD  cetirizine (ZYRTEC) 10 MG tablet Take 10 mg by mouth daily.      [provider]  dicyclomine (BENTYL) 10 MG capsule TAKE ONE CAPSULE  BY MOUTH 3 TIMES A DAY BEFORE MEALS Patient taking differently: Take 10 mg by mouth 3 (three) times daily before meals.  06/20/18   Janith Lima, MD  fluticasone (FLONASE) 50 MCG/ACT nasal spray Place 2 sprays into both nostrils at bedtime. 12/30/18   Janith Lima, MD  furosemide (LASIX) 40 MG tablet TAKE 1 TO 2 TABLETS (40-80 MG TOTAL) BY MOUTH DAILY. Patient taking differently: Take 40-80 mg by mouth daily.  07/13/18   Janith Lima, MD  losartan (COZAAR) 50 MG tablet TAKE 1 TABLET BY MOUTH EVERY DAY 03/27/19   Janith Lima, MD  NIFEdipine (NIFEDICAL XL) 60 MG 24 hr tablet Take 1 tablet (60 mg total) by mouth daily. 01/05/19   Janith Lima, MD  PROAIR HFA 108 [phone] Base) MCG/ACT inhaler INHALE 1 TO 2 PUFFS BY MOUTH EVERY 4 HOURS AS NEEDED FOR WHEEZE OR SHORTNESS OF BREATH 06/05/15   Janith Lima, MD  Vitamin D, Ergocalciferol, (DRISDOL) 1.25 MG (50000 UT) CAPS capsule Take 1 capsule (50,000 Units total) by mouth every 7 (seven) days. 11/24/18   Janith Lima, MD    Family History Family History  Problem Relation Age of Onset  . Emphysema Mother   . Heart disease Father   . Colon cancer Sister   . Colon cancer Other   . Pancreatic cancer Other     Social History Social History   Tobacco Use  . Smoking status: Never Smoker  . Smokeless tobacco: Never Used  Substance Use Topics  . Alcohol use: No    Alcohol/week: 0.0 standard drinks  . Drug use: No     Allergies   Codeine, Metformin and related, and Penicillins   Review of Systems Review of Systems  Constitutional: Positive for chills and fever.  HENT: Positive for rhinorrhea and sore throat. Negative for congestion and ear pain.   Respiratory: Positive for cough. Negative for shortness of breath and wheezing.   Cardiovascular: Negative for chest pain.  Gastrointestinal: Negative for abdominal pain, diarrhea, nausea and vomiting.  Genitourinary: Negative for dysuria and frequency.  Musculoskeletal: Positive for  myalgias. Negative for arthralgias and back pain.  Skin: Negative for color change and rash.  Neurological: Negative for dizziness, syncope and light-headedness.  All other systems reviewed and are negative.    Physical Exam Updated Vital Signs BP 133/71 (BP Location: Right Arm)   Pulse 84   Temp 98.5 F (36.9 C) (Oral)   Resp 18   SpO2 96%   Physical Exam Vitals signs and nursing note reviewed.  Constitutional:      General: She is not in acute distress.    Appearance: Normal appearance. She  is well-developed and normal weight. She is not ill-appearing or diaphoretic.     Comments: Very well-appearing and in no distress.  HENT:     Head: Normocephalic and atraumatic.     Nose: Congestion and rhinorrhea present.     Mouth/Throat:     Mouth: Mucous membranes are moist.     Pharynx: Oropharynx is clear. No oropharyngeal exudate or posterior oropharyngeal erythema.     Comments: Posterior oropharynx is clear moist without erythema, edema or exudates. Eyes:     General:        Right eye: No discharge.        Left eye: No discharge.  Neck:     Musculoskeletal: Neck supple.  Cardiovascular:     Rate and Rhythm: Normal rate and regular rhythm.     Heart sounds: Normal heart sounds. No murmur. No friction rub. No gallop.   Pulmonary:     Effort: Pulmonary effort is normal. No respiratory distress.     Breath sounds: Normal breath sounds. No wheezing or rales.     Comments: Respirations equal and unlabored, patient able to speak in full sentences, lungs clear to auscultation bilaterally Abdominal:     General: Bowel sounds are normal. There is no distension.     Palpations: Abdomen is soft. There is no mass.     Tenderness: There is no abdominal tenderness. There is no guarding.     Comments: Abdomen soft, nondistended, nontender to palpation in all quadrants without guarding or peritoneal signs  Musculoskeletal:        General: No deformity.  Skin:    General: Skin is warm  and dry.     Capillary Refill: Capillary refill takes less than 2 seconds.  Neurological:     Mental Status: She is alert.     Coordination: Coordination normal.     Comments: Speech is clear, able to follow commands Moves extremities without ataxia, coordination intact  Psychiatric:        Mood and Affect: Mood normal.        Behavior: Behavior normal.      ED Treatments / Results  Labs (all labs ordered are listed, but only abnormal results are displayed) Labs Reviewed  SARS CORONAVIRUS 2 (TAT 6-24 HRS)    EKG None  Radiology Dg Chest Port 1 View  Result Date: 04/05/2019 CLINICAL DATA:  Cough.  Recent exposure to COVID EXAM: PORTABLE CHEST 1 VIEW COMPARISON:  None. FINDINGS: The heart size and mediastinal contours are within normal limits. Both lungs are clear. The visualized skeletal structures are unremarkable. IMPRESSION: No active disease. Electronically Signed   By: Kerby Moors M.D.   On: 04/05/2019 13:50    Procedures Procedures (including critical care time)  Medications Ordered in ED Medications - No data to display   Initial Impression / Assessment and Plan / ED Course  I have reviewed the triage vital signs and the nursing notes.  Pertinent labs & imaging results that were available during my care of the patient were reviewed by me and considered in my medical decision making (see chart for details).  84 yo F presents after known COVID exposure presents via EMS for COVID testing, husband is currently hospitalized with COVID and yesterday patient developed fevers and mild cough and rhinorrhea. NO shortness of breath or chest pain and ambulated in department and maintained O2 sats > 95%. CXR is clear. Has IBS at baseline but no increased diarrhea no vomiting or abdominal pain.  Patient is  very well-appearing with normal vitals.  Covid test sent and I have discussed with patient and both of her daughters that she is assumed to be positive, but is stable for  discharge home with continued symptomatic treatment with Tylenol and over-the-counter cough medications, discussed strict return precautions and encourage home pulse ox monitoring.  Patient and daughters expressed understanding and agreement with plan.  Discharged home in good condition.  Ayisha Meyn was evaluated in Emergency Department on 04/05/2019 for the symptoms described in the history of present illness. She was evaluated in the context of the global COVID-19 pandemic, which necessitated consideration that the patient might be at risk for infection with the SARS-CoV-2 virus that causes COVID-19. Institutional protocols and algorithms that pertain to the evaluation of patients at risk for COVID-19 are in a state of rapid change based on information released by regulatory bodies including the CDC and federal and state organizations. These policies and algorithms were followed during the patient's care in the ED.  Final Clinical Impressions(s) / ED Diagnoses   Final diagnoses:  Suspected COVID-19 virus infection    ED Discharge Orders    None       Jacqlyn Larsen, [place] 04/05/19 1753    Lacretia Leigh, MD 04/06/19 [phone]

## 2019-04-05 NOTE — Discharge Instructions (Addendum)
You likely have coronavirus, you have a test pending and will be called with positive results..  Please continue to quarantine at home and monitor your symptoms closely. You chest x-ray was clear. Antibiotics are not helpful in treating viral infection, the virus should run its course in about 10-14 days. Please make sure you are drinking plenty of fluids. You can treat your symptoms supportively with tylenol for fevers and pains, and over the counter cough syrups and throat lozenges to help with cough. If your symptoms are not improving please follow up with you Primary doctor.   I recommend that you purchase a home pulse ox to help better monitor your oxygen at home, if you start to have increased work of breathing or shortness of breath or your oxygen drops below 92% please immediately return to the hospital for reevaluation.  If you develop persistent fevers, shortness of breath or difficulty breathing, chest pain, severe headache and neck pain, persistent nausea and vomiting or other new or concerning symptoms return to the Emergency department.

## 2019-04-05 NOTE — ED Notes (Signed)
Pt ambulated in the room and was able to maintain her O2 level above 95%.  Pt denies any SOB on exertion.

## 2019-04-05 NOTE — ED Triage Notes (Signed)
Pt presents to the ED for a COVID test.  Pt's only symptom is bodyaches. Pt's husband tested positive for COVID.  Pt is hard of hearing. Pt a/o x 4.

## 2019-04-05 NOTE — ED Notes (Signed)
X-ray at bedside

## 2019-04-06 ENCOUNTER — Telehealth: Payer: Self-pay | Admitting: *Deleted

## 2019-04-06 ENCOUNTER — Emergency Department (HOSPITAL_COMMUNITY): Payer: Medicare Other

## 2019-04-06 ENCOUNTER — Encounter (HOSPITAL_COMMUNITY): Payer: Self-pay | Admitting: Emergency Medicine

## 2019-04-06 ENCOUNTER — Emergency Department (HOSPITAL_COMMUNITY)
Admission: EM | Admit: 2019-04-06 | Discharge: 2019-04-06 | Disposition: A | Discharge status: Home / Self Care | Payer: Medicare Other | Attending: Emergency Medicine | Admitting: Emergency Medicine

## 2019-04-06 DIAGNOSIS — J45909 Unspecified asthma, uncomplicated: Secondary | ICD-10-CM | POA: Insufficient documentation

## 2019-04-06 DIAGNOSIS — Z79899 Other long term (current) drug therapy: Secondary | ICD-10-CM | POA: Insufficient documentation

## 2019-04-06 DIAGNOSIS — E119 Type 2 diabetes mellitus without complications: Secondary | ICD-10-CM | POA: Diagnosis not present

## 2019-04-06 DIAGNOSIS — R918 Other nonspecific abnormal finding of lung field: Secondary | ICD-10-CM | POA: Diagnosis not present

## 2019-04-06 DIAGNOSIS — R509 Fever, unspecified: Secondary | ICD-10-CM | POA: Diagnosis not present

## 2019-04-06 DIAGNOSIS — U071 COVID-19: Secondary | ICD-10-CM | POA: Insufficient documentation

## 2019-04-06 DIAGNOSIS — M7918 Myalgia, other site: Secondary | ICD-10-CM | POA: Diagnosis present

## 2019-04-06 DIAGNOSIS — R52 Pain, unspecified: Secondary | ICD-10-CM

## 2019-04-06 DIAGNOSIS — R531 Weakness: Secondary | ICD-10-CM | POA: Diagnosis not present

## 2019-04-06 DIAGNOSIS — I959 Hypotension, unspecified: Secondary | ICD-10-CM | POA: Diagnosis not present

## 2019-04-06 DIAGNOSIS — R0602 Shortness of breath: Secondary | ICD-10-CM | POA: Insufficient documentation

## 2019-04-06 DIAGNOSIS — Z743 Need for continuous supervision: Secondary | ICD-10-CM | POA: Diagnosis not present

## 2019-04-06 DIAGNOSIS — I2510 Atherosclerotic heart disease of native coronary artery without angina pectoris: Secondary | ICD-10-CM | POA: Diagnosis not present

## 2019-04-06 DIAGNOSIS — I10 Essential (primary) hypertension: Secondary | ICD-10-CM | POA: Insufficient documentation

## 2019-04-06 DIAGNOSIS — R279 Unspecified lack of coordination: Secondary | ICD-10-CM | POA: Diagnosis not present

## 2019-04-06 DIAGNOSIS — R5381 Other malaise: Secondary | ICD-10-CM | POA: Diagnosis not present

## 2019-04-06 LAB — CBC WITH DIFFERENTIAL/PLATELET
Abs Immature Granulocytes: 0.02 10*3/uL (ref 0.00–0.07)
Basophils Absolute: 0 10*3/uL (ref 0.0–0.1)
Basophils Relative: 0 %
Eosinophils Absolute: 0.2 10*3/uL (ref 0.0–0.5)
Eosinophils Relative: 2 %
HCT: 39.8 % (ref 36.0–46.0)
Hemoglobin: 12.5 g/dL (ref 12.0–15.0)
Immature Granulocytes: 0 %
Lymphocytes Relative: 32 %
Lymphs Abs: 2 10*3/uL (ref 0.7–4.0)
MCH: 30.9 pg (ref 26.0–34.0)
MCHC: 31.4 g/dL (ref 30.0–36.0)
MCV: 98.5 fL (ref 80.0–100.0)
Monocytes Absolute: 0.7 10*3/uL (ref 0.1–1.0)
Monocytes Relative: 11 %
Neutro Abs: 3.3 10*3/uL (ref 1.7–7.7)
Neutrophils Relative %: 55 %
Platelets: 169 10*3/uL (ref 150–400)
RBC: 4.04 MIL/uL (ref 3.87–5.11)
RDW: 13.2 % (ref 11.5–15.5)
WBC: 6.2 10*3/uL (ref 4.0–10.5)
nRBC: 0 % (ref 0.0–0.2)

## 2019-04-06 LAB — COMPREHENSIVE METABOLIC PANEL
ALT: 16 U/L (ref 0–44)
AST: 18 U/L (ref 15–41)
Albumin: 3.5 g/dL (ref 3.5–5.0)
Alkaline Phosphatase: 57 U/L (ref 38–126)
Anion gap: 9 (ref 5–15)
BUN: 11 mg/dL (ref 8–23)
CO2: 23 mmol/L (ref 22–32)
Calcium: 8.6 mg/dL — ABNORMAL LOW (ref 8.9–10.3)
Chloride: 105 mmol/L (ref 98–111)
Creatinine, Ser: 0.71 mg/dL (ref 0.44–1.00)
GFR calc Af Amer: >60 mL/min (ref >60)
GFR calc non Af Amer: >60 mL/min (ref >60)
Glucose, Bld: 107 mg/dL — ABNORMAL HIGH (ref 70–99)
Potassium: 4 mmol/L (ref 3.5–5.1)
Sodium: 137 mmol/L (ref 135–145)
Total Bilirubin: 0.8 mg/dL (ref 0.3–1.2)
Total Protein: 6.5 g/dL (ref 6.5–8.1)

## 2019-04-06 IMAGING — DX DG CHEST 1V PORT
1 series · 1 of 1 positions shown · non-contrast
Comparison: Chest x-ray from yesterday.

CLINICAL DATA: Weakness.  [id number] positive.

EXAM:
PORTABLE CHEST 1 VIEW

[chest ap]
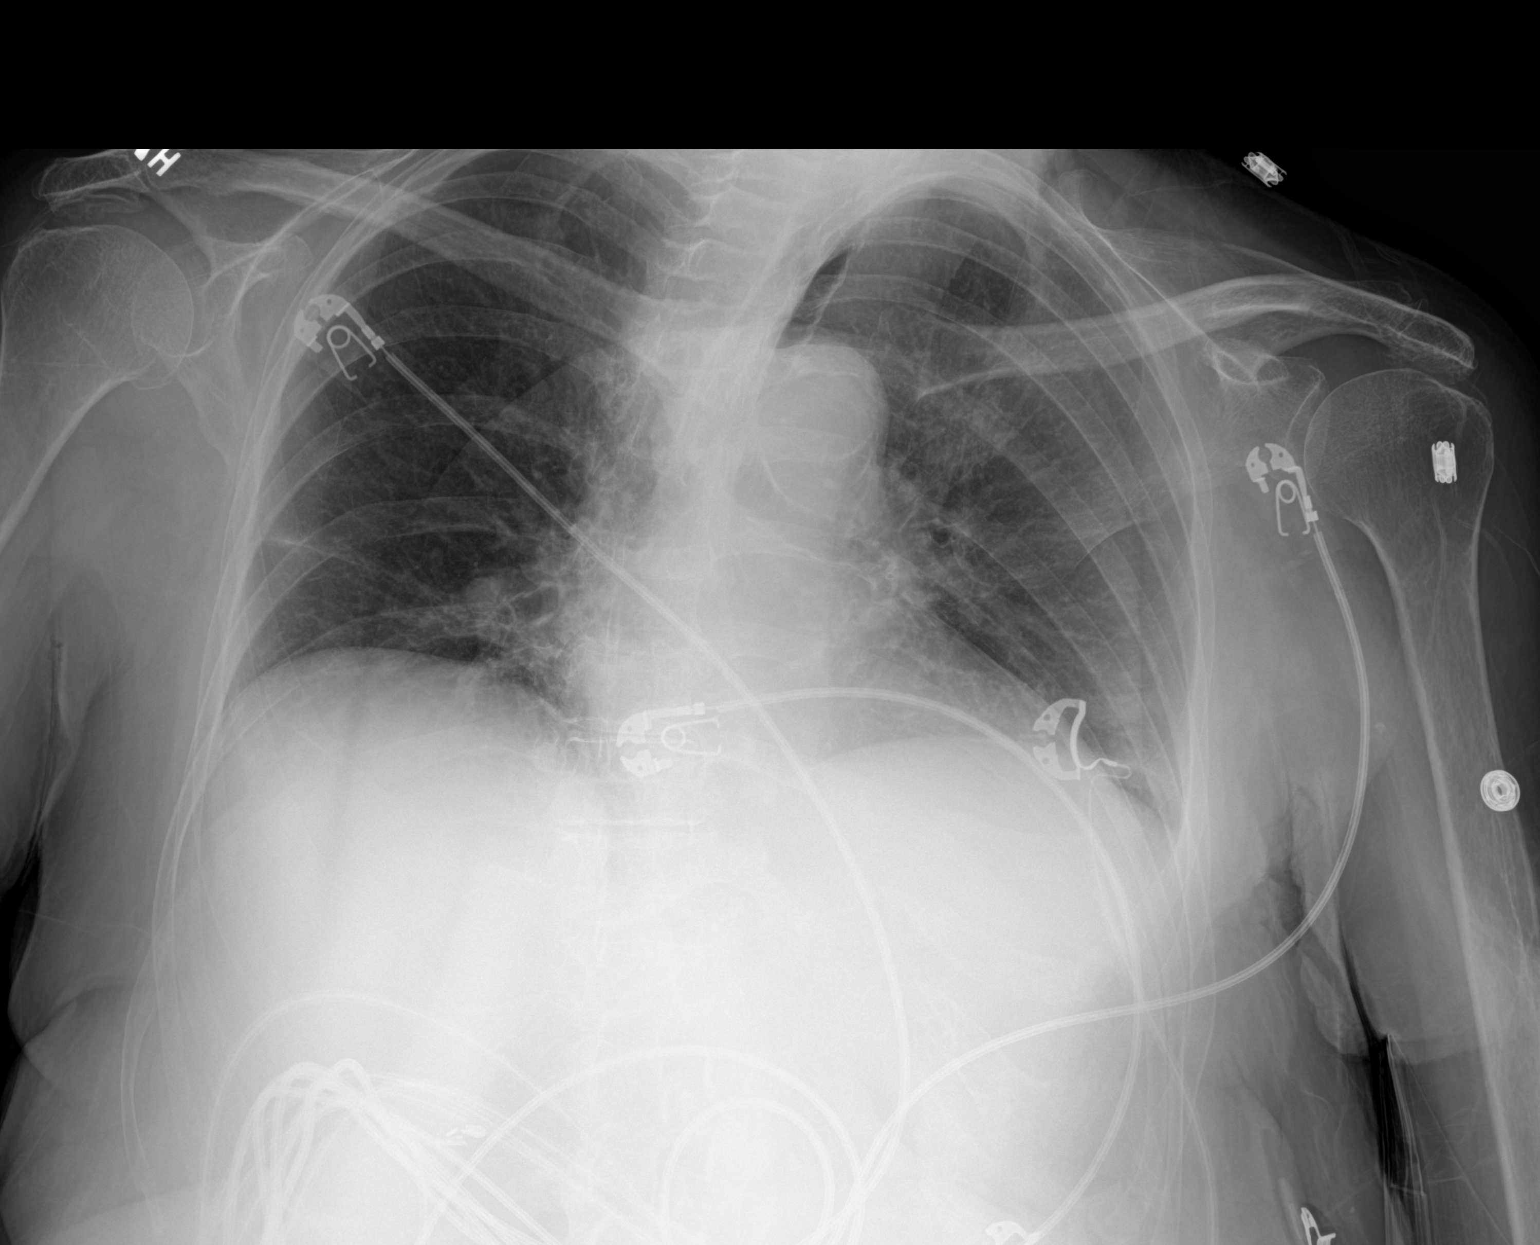

[1 of 1 positions shown; findings below may reference images not displayed]

FINDINGS: The patient is rotated to the left. The heart size and mediastinal
contours are within normal limits. Normal pulmonary vascularity. New
subtle increased opacity at the peripheral left lung base. No
pleural effusion or pneumothorax. No acute osseous abnormality.
IMPRESSION: 1. New subtle increased opacity at the left lung base, concerning
for developing pneumonia.

## 2019-04-06 NOTE — ED Notes (Addendum)
Pt maintained O2 sats pf 97% RA while ambulating in room without assistance

## 2019-04-06 NOTE — ED Notes (Signed)
Pt was verbalized discharge instructions. Pt had no further questions at this time. NAD. 

## 2019-04-06 NOTE — Telephone Encounter (Signed)
Called daughter in response to the VM that she left nurse. Daughter Nevin Bloodgood explained that patient has been diagnosed with covid-19 and she is feeling symptomatic with SOB and weakness. Nurse advised daughter to bring the pateint to the ED to be checked out for her health and safety. Daughter explained that she does not have transportation and nurse did advise her to call EMS and the EMS number was provided. Daughter verbalized understanding and stated that she agreed and the she would contact EMS.

## 2019-04-06 NOTE — ED Triage Notes (Signed)
Patient here from home with complaints of weakness today. Positive COVID test yesterday. Reports bodyaches

## 2019-04-06 NOTE — ED Notes (Signed)
[id number] 

## 2019-04-06 NOTE — Telephone Encounter (Signed)
Copied from [place] [id number]. Topic: General - Other >> Apr 06, 2019 12:58 PM Burchel, Abbi R wrote: Reason for CRM: Please have Sharee Pimple call pt's daughter, Stanton Kidney. [phone]

## 2019-04-06 NOTE — ED Notes (Signed)
[facility] unable to obtain signature. [facility] called for transportation. Paperwork at nurses stations.

## 2019-04-06 NOTE — ED Provider Notes (Addendum)
[facility] DEPT Provider Note   CSN: [id number] Arrival date & time: 04/06/19  1410     History   Chief Complaint Chief Complaint  Patient presents with  . Weakness  . COVID +    HPI Unknown Nichole Silva is a 84 y.o. female who presents with body aches.  Patient states that she was here yesterday for a prolonged period of time due to fever with body aches and exposure to Covid since her husband currently has it.  Patient was discharged home last night and Covid test came back positive.  Patient states that today she has been mostly lying in bed watching TV.  She states that she has some mild body aches and generalized malaise but otherwise feels okay.  Staff member called patient's daughter who told her that the patient tested positive.  Her daughter told the person over the phone that the patient was complaining of generalized weakness and shortness of breath and therefore it was recommended for her to come back to the ED.  Patient states that the person on the phone told her to be admitted to the hospital.  Patient states that she does not feel extremely ill.  She denies fever today, headache, chest pain, shortness of breath, cough, abdominal pain, nausea, vomiting, diarrhea.     HPI  Past Medical History:  Diagnosis Date  . Allergic rhinitis, cause unspecified   . Anxiety state, unspecified   . Asthma   . Coronary atherosclerosis of unspecified type of vessel, native or graft   . Depression   . Female bladder prolapse   . Hyperlipidemia   . IBS (irritable bowel syndrome)   . Osteoarthrosis, unspecified whether generalized or localized, unspecified site   . Type II or unspecified type diabetes mellitus without mention of complication, not stated as uncontrolled   . Unspecified chronic bronchitis ([facility] [place])   . Unspecified essential hypertension   . Unspecified hearing loss   . Unspecified venous (peripheral) insufficiency   . Unspecified vitamin D  deficiency     Patient Active Problem List   Diagnosis Date Noted  . Dementia without behavioral disturbance ([place]) 11/17/2018  . Estrogen deficiency 03/08/2017  . Age-related osteoporosis with current pathological fracture 03/08/2017  . Eczema 05/28/2016  . Asthma, moderate persistent 07/29/2015  . IBS (irritable bowel syndrome) 01/30/2015  . Pessary maintenance 10/25/2013  . Cystocele with uterine prolapse 10/25/2013  . Anxiety 10/06/2013  . Routine general medical examination at a health care facility 09/14/2013  . Vitamin D deficiency 02/25/2009  . DEGENERATIVE JOINT DISEASE 02/27/2008  . Type II diabetes mellitus with manifestations ([place]) 09/02/2007  . Hyperlipidemia with target LDL less than 100 07/05/2007  . Essential hypertension 07/05/2007  . Coronary atherosclerosis 07/05/2007    Past Surgical History:  Procedure Laterality Date  . Cataract surg  2009  . CHOLECYSTECTOMY  1997  . DILATION AND CURETTAGE OF UTERUS  1960's  . Left elbow surgery  1992     OB History    Gravida  6   Para  4   Term  3   Preterm  1   AB  2   Living  4     SAB  2   TAB      Ectopic      Multiple      Live Births  4            Home Medications    Prior to Admission medications   Medication Sig Start Date  End Date Taking? Authorizing Provider  acetaminophen (TYLENOL) 325 MG tablet Take 325 mg by mouth every 6 (six) hours as needed for mild pain or moderate pain. Pain/headache    [provider]  ALPRAZolam (XANAX) 0.5 MG tablet TAKE 1-2 TABLETS (0.5-1 MG TOTAL) BY MOUTH 3 (THREE) TIMES DAILY AS NEEDED FOR ANXIETY. 03/31/19   Janith Lima, MD  atorvastatin (LIPITOR) 40 MG tablet Take 1 tablet (40 mg total) by mouth daily at 6 PM. 11/17/18   Janith Lima, MD  BREO ELLIPTA 200-25 MCG/INH AEPB INHALE 1 PUFF BY MOUTH EVERY DAY 01/26/19   Janith Lima, MD  cetirizine (ZYRTEC) 10 MG tablet Take 10 mg by mouth daily.      [provider]  dicyclomine  (BENTYL) 10 MG capsule TAKE ONE CAPSULE BY MOUTH 3 TIMES A DAY BEFORE MEALS Patient taking differently: Take 10 mg by mouth 3 (three) times daily before meals.  06/20/18   Janith Lima, MD  fluticasone (FLONASE) 50 MCG/ACT nasal spray Place 2 sprays into both nostrils at bedtime. 12/30/18   Janith Lima, MD  furosemide (LASIX) 40 MG tablet TAKE 1 TO 2 TABLETS (40-80 MG TOTAL) BY MOUTH DAILY. Patient taking differently: Take 40-80 mg by mouth daily.  07/13/18   Janith Lima, MD  losartan (COZAAR) 50 MG tablet TAKE 1 TABLET BY MOUTH EVERY DAY 03/27/19   Janith Lima, MD  NIFEdipine (NIFEDICAL XL) 60 MG 24 hr tablet Take 1 tablet (60 mg total) by mouth daily. 01/05/19   Janith Lima, MD  PROAIR HFA 108 [phone] Base) MCG/ACT inhaler INHALE 1 TO 2 PUFFS BY MOUTH EVERY 4 HOURS AS NEEDED FOR WHEEZE OR SHORTNESS OF BREATH 06/05/15   Janith Lima, MD  Vitamin D, Ergocalciferol, (DRISDOL) 1.25 MG (50000 UT) CAPS capsule Take 1 capsule (50,000 Units total) by mouth every 7 (seven) days. 11/24/18   Janith Lima, MD    Family History Family History  Problem Relation Age of Onset  . Emphysema Mother   . Heart disease Father   . Colon cancer Sister   . Colon cancer Other   . Pancreatic cancer Other     Social History Social History   Tobacco Use  . Smoking status: Never Smoker  . Smokeless tobacco: Never Used  Substance Use Topics  . Alcohol use: No    Alcohol/week: 0.0 standard drinks  . Drug use: No     Allergies   Codeine, Metformin and related, and Penicillins   Review of Systems Review of Systems  Constitutional: Positive for fatigue and fever (Resolved). Negative for activity change, appetite change and chills.  Respiratory: Negative for cough and shortness of breath.   Cardiovascular: Negative for chest pain.  Gastrointestinal: Negative for abdominal pain, diarrhea, nausea and vomiting.  Neurological: Negative for syncope and headaches.  All other systems reviewed and are  negative.    Physical Exam Updated Vital Signs BP 123/68 (BP Location: Left Arm)   Pulse 68   Resp 15   SpO2 96%   Physical Exam Vitals signs and nursing note reviewed.  Constitutional:      General: She is not in acute distress.    Appearance: Normal appearance. She is well-developed. She is not ill-appearing.     Comments: Elderly female in no acute distress  HENT:     Head: Normocephalic and atraumatic.  Eyes:     General: No scleral icterus.       Right eye:  No discharge.        Left eye: No discharge.     Conjunctiva/sclera: Conjunctivae normal.     Pupils: Pupils are equal, round, and reactive to light.  Neck:     Musculoskeletal: Normal range of motion.  Cardiovascular:     Rate and Rhythm: Normal rate and regular rhythm.  Pulmonary:     Effort: Pulmonary effort is normal. No respiratory distress.     Breath sounds: Normal breath sounds.  Abdominal:     General: There is no distension.     Palpations: Abdomen is soft.     Tenderness: There is no abdominal tenderness.  Skin:    General: Skin is warm and dry.  Neurological:     Mental Status: She is alert and oriented to person, place, and time.  Psychiatric:        Behavior: Behavior normal.      ED Treatments / Results  Labs (all labs ordered are listed, but only abnormal results are displayed) Labs Reviewed  COMPREHENSIVE METABOLIC PANEL - Abnormal; Notable for the following components:      Result Value   Glucose, Bld 107 (*)    Calcium 8.6 (*)    All other components within normal limits  CBC WITH DIFFERENTIAL/PLATELET    EKG EKG Interpretation  Date/Time:  Thursday April 06 2019 15:29:00 EST Ventricular Rate:  66 PR Interval:    QRS Duration: 101 QT Interval:  412 QTC Calculation: 432 R Axis:   -22 Text Interpretation: Sinus rhythm Borderline left axis deviation Low voltage, precordial leads Consider anterior infarct No old tracing to compare Confirmed by Daleen Bo [phone]) on  04/06/2019 7:07:25 PM   Radiology Dg Chest Port 1 View  Result Date: 04/06/2019 CLINICAL DATA:  Weakness.  COVID-19 positive. EXAM: PORTABLE CHEST 1 VIEW COMPARISON:  Chest x-ray from yesterday. FINDINGS: The patient is rotated to the left. The heart size and mediastinal contours are within normal limits. Normal pulmonary vascularity. New subtle increased opacity at the peripheral left lung base. No pleural effusion or pneumothorax. No acute osseous abnormality. IMPRESSION: 1. New subtle increased opacity at the left lung base, concerning for developing pneumonia. Electronically Signed   By: Titus Dubin M.D.   On: 04/06/2019 17:15    Procedures Procedures (including critical care time)  Medications Ordered in ED Medications - No data to display   Initial Impression / Assessment and Plan / ED Course  I have reviewed the triage vital signs and the nursing notes.  Pertinent labs & imaging results that were available during my care of the patient were reviewed by me and considered in my medical decision making (see chart for details).  84 year old female who is COVID + presents for generalized weakness and body aches. Vitals are normal. She is very well appearing. Exam is unremarkable. She states she is primarily here because the person on the phone who told her daughter what the COVID results are told her to come to the ED to be admitted (although on chart review her daughter told the RN that she was symptomatic and so they advised to go to the ED to be rechecked). She really has no significant complaints and was doing fine at home. Will obtain labs, CXR, and ambulate to check O2 sats.  Labs are reassuring. CXR shows possible developing pneumonia in the left lung base. She ambulated and maintained her sats and has tolerated PO. Shared visit with Dr. Eulis Foster. She does not meet criteria for admission  at this time despite being higher risk due to age. She was advised to return if she is  worsening.  Final Clinical Impressions(s) / ED Diagnoses   Final diagnoses:  COVID-19  Body aches    ED Discharge Orders    None       Recardo Evangelist, PA-C 04/07/19 1735    Recardo Evangelist, PA-C 04/07/19 1735    Daleen Bo, MD 04/09/19 [phone]

## 2019-04-06 NOTE — Discharge Instructions (Signed)
Take Tylenol as needed for fever or body aches Please rest and drink plenty of fluids Return if you are worsening

## 2019-04-06 NOTE — ED Notes (Signed)
Patient has a extra blue top and gold top in the main lab

## 2019-04-06 NOTE — ED Provider Notes (Signed)
  Face-to-face evaluation   History: She complains of mild shortness of breath and general weakness.  She was able to eat today.  Physical exam: Alert, elderly female.  No respiratory distress.  Normal pulse.  Abdomen soft and nontender.  Medical screening examination/treatment/procedure(s) were conducted as a shared visit with non-physician practitioner(s) and myself.  I personally evaluated the patient during the encounter    Daleen Bo, MD 04/09/19 [phone]

## 2019-04-07 NOTE — Telephone Encounter (Signed)
Nurse called and LVM in response to a staff message that patient's Daughter Stanton Kidney would like to speak with her. Nurse LVM stating she would try to call [facility] back later today.

## 2019-04-07 NOTE — Telephone Encounter (Signed)
Nurse called and left a second VM for patient's daughter Stanton Kidney and called number [phone] as was requested by staff message. Nurse left her desk number for [facility] to call her back and nurse will attempt to reach back out to [place] at a later date.

## 2019-04-10 ENCOUNTER — Ambulatory Visit: Payer: Medicare Other | Admitting: Internal Medicine

## 2019-04-11 ENCOUNTER — Other Ambulatory Visit: Payer: Self-pay | Admitting: *Deleted

## 2019-04-11 NOTE — Patient Outreach (Signed)
[name] Care Management  04/11/2019  Nichole Silva [birth year] [id number]    Referral received 04/10/2019 Initial Outreach 04/11/2019  RN attempted outreach however unsuccessful. RN able to leave a HIPAA approved voice message to the contact number available.   PLAN: Will further engage upon the next outreach or call back for this pt. Will reschedule another outreach call over the next week.  Raina Mina, RN Care Management Coordinator [place] Office [phone]

## 2019-04-18 ENCOUNTER — Other Ambulatory Visit: Payer: Self-pay | Admitting: *Deleted

## 2019-04-18 NOTE — Patient Outreach (Addendum)
[facility]) Care Management  04/18/2019  Nichole Silva November 19, 1934 [id number]    Telephone Assessment  RN initially spoke with pt's spouse and pt was in the background however pt is very [place] and unable to speak directly to RN case manager. RN introduced [facility] services however spouse very irritated due to all the calls received and selling of services. RN verified [facility] services are at no charge and how we receive referrals. RN offered to speak with one of daughter's Stanton Kidney was listed on the referral and spouse indicated that would be okay. Spouse also requested a call back next week.   RN contacted Stanton Kidney pt's daughter and introduced [facility] services and again the purpose for the call. Daughter was more in detail concerning all the calls the pt and spouse are received indicating an agency has tried to sign them up for services that they were not aware of on the referrals and the spouse may have mistaken [facility] was one of those agencies. RN explained in detail the purpose for assisting pt with any of her medical issues and availabilty of any community resources that maybe available to assist through a referral to a Education officer, museum at [facility]. Also discussed any issues related to medications as [facility] has pharmacy assist also availabl. Explained the difference between [facility] and [facility] services and informed daughter that there would be ongoing communication with the pt's provider Dr. Ronnald Ramp on pt's disposition and progress while enrolled in [facility] services (daughter verbalized an understanding).  Daughter states pt is currently getting her special phone for [facility] replaced and this would  be in place next week. Daughter really wants RN case manager to speak directly with the pt. RN offered to call back next week on Friday later in the afternoon to allow phone replacement for the pt. Daughter receptive to a date and time as family would be allow to enter the home at that time based upon the quarantine time allow per pt's  provider as stated.  NOTE: Also states everyone in the household has tested positive for Co-vid so all are a little irritated with the incoming calls.   PLAN: RN will follow up next Friday with pending [facility] services for this pt.   Raina Mina, RN Care Management Coordinator [place] Office [phone]

## 2019-04-26 ENCOUNTER — Other Ambulatory Visit: Payer: Self-pay | Admitting: *Deleted

## 2019-04-26 NOTE — Patient Outreach (Signed)
[facility] Care Management  04/26/2019  Nichole Silva April 21, 1935 [id number]  Case closure  RN received a written request that pt does not wish to continue [facility] services and requested no additional calls at this time. Pt has [place] contacts and will reach out at her leisure for any [facility] services needed. Pt has indicated in her letter that she will keep the [facility] information on file and if she decides she needs services will contact [place] directly. Pt further indicates she has a good support system with family and church friends who delivers food to her "just about everyday".   This pt is continue to recovering from Co-vide and her spouse has been in the hospital since 11/3. Pt is being very careful and feels she is managing her care sufficiently. Pt mentioned several times in her letter to remove her name from [facility] file indicating someone else can benefit but again she is not in need of [facility] services at this time.   PLAN: RN will close any pending outreach calls and close this case with no additional call or contact to this pt as requested. Will alert CMA and provider's office of pt's request of the denial of [facility] services.  Raina Mina, RN Care Management Coordinator [place] Office [phone]

## 2019-04-28 ENCOUNTER — Ambulatory Visit: Payer: Self-pay | Admitting: *Deleted

## 2019-05-28 ENCOUNTER — Other Ambulatory Visit: Payer: Self-pay | Admitting: Internal Medicine

## 2019-05-28 DIAGNOSIS — E785 Hyperlipidemia, unspecified: Secondary | ICD-10-CM

## 2019-06-07 ENCOUNTER — Other Ambulatory Visit: Payer: Self-pay

## 2019-06-07 ENCOUNTER — Encounter: Payer: Self-pay | Admitting: Internal Medicine

## 2019-06-07 ENCOUNTER — Ambulatory Visit (INDEPENDENT_AMBULATORY_CARE_PROVIDER_SITE_OTHER): Payer: Medicare Other | Admitting: Internal Medicine

## 2019-06-07 VITALS — BP 120/70 | HR 79 | Temp 98.7°F | Ht 64.0 in | Wt 137.0 lb

## 2019-06-07 DIAGNOSIS — Z8616 Personal history of COVID-19: Secondary | ICD-10-CM

## 2019-06-07 DIAGNOSIS — I10 Essential (primary) hypertension: Secondary | ICD-10-CM

## 2019-06-07 NOTE — Progress Notes (Signed)
Subjective:  Patient ID: Nichole Silva, female    DOB: 13-Sep-1934  Age: 85 y.o. MRN: [id number]  CC: Hypertension  This visit occurred during the SARS-CoV-2 public health emergency.  Safety protocols were in place, including screening questions prior to the visit, additional usage of staff PPE, and extensive cleaning of exam room while observing appropriate contact time as indicated for disinfecting solutions.    HPI Nichole Silva presents for f/up - She contracted Covid about 2 months ago.  She had a few days of diarrhea, nausea, vomiting, fever, and chills.  Her only lingering symptom is mild muscle aches which are improving.  She otherwise feels well today and offers no complaints.  Outpatient Medications Prior to Visit  Medication Sig Dispense Refill  . acetaminophen (TYLENOL) 325 MG tablet Take 325 mg by mouth every 6 (six) hours as needed for mild pain or moderate pain. Pain/headache    . ALPRAZolam (XANAX) 0.5 MG tablet TAKE 1-2 TABLETS (0.5-1 MG TOTAL) BY MOUTH 3 (THREE) TIMES DAILY AS NEEDED FOR ANXIETY. 90 tablet 3  . atorvastatin (LIPITOR) 40 MG tablet TAKE 1 TABLET (40 MG TOTAL) BY MOUTH DAILY AT 6 PM. 90 tablet 1  . BREO ELLIPTA 200-25 MCG/INH AEPB INHALE 1 PUFF BY MOUTH EVERY DAY (Patient taking differently: Inhale 1 puff into the lungs daily. ) 120 each 1  . cetirizine (ZYRTEC) 10 MG tablet Take 10 mg by mouth daily.      Nichole Kitchen dicyclomine (BENTYL) 10 MG capsule TAKE ONE CAPSULE BY MOUTH 3 TIMES A DAY BEFORE MEALS (Patient taking differently: Take 10 mg by mouth 3 (three) times daily before meals. ) 270 capsule 2  . fluticasone (FLONASE) 50 MCG/ACT nasal spray Place 2 sprays into both nostrils at bedtime. 48 mL 1  . furosemide (LASIX) 40 MG tablet TAKE 1 TO 2 TABLETS (40-80 MG TOTAL) BY MOUTH DAILY. (Patient taking differently: Take 40-80 mg by mouth daily. ) 170 tablet 0  . losartan (COZAAR) 50 MG tablet TAKE 1 TABLET BY MOUTH EVERY DAY (Patient taking differently: Take 50 mg by  mouth daily. ) 90 tablet 1  . NIFEdipine (NIFEDICAL XL) 60 MG 24 hr tablet Take 1 tablet (60 mg total) by mouth daily. 90 tablet 1  . PROAIR HFA 108 (90 Base) MCG/ACT inhaler INHALE 1 TO 2 PUFFS BY MOUTH EVERY 4 HOURS AS NEEDED FOR WHEEZE OR SHORTNESS OF BREATH (Patient taking differently: Inhale 1-2 puffs into the lungs every 4 (four) hours as needed for wheezing or shortness of breath. ) 8.5 Inhaler 11  . Vitamin D, Ergocalciferol, (DRISDOL) 1.25 MG (50000 UT) CAPS capsule Take 1 capsule (50,000 Units total) by mouth every 7 (seven) days. 90 capsule 0   No facility-administered medications prior to visit.    ROS Review of Systems  Constitutional: Negative for chills, diaphoresis, fatigue and fever.  HENT: Negative.  Negative for sore throat and trouble swallowing.   Eyes: Negative.   Respiratory: Negative for cough, chest tightness, shortness of breath and wheezing.   Cardiovascular: Negative for chest pain, palpitations and leg swelling.  Gastrointestinal: Negative for abdominal pain, constipation, diarrhea and nausea.  Endocrine: Negative.   Genitourinary: Negative.  Negative for difficulty urinating and dysuria.  Musculoskeletal: Positive for arthralgias and myalgias.  Skin: Negative.  Negative for pallor and rash.  Neurological: Negative.  Negative for dizziness, weakness, light-headedness and headaches.  Hematological: Negative for adenopathy. Does not bruise/bleed easily.  Psychiatric/Behavioral: Negative.     Objective:  BP 120/70 (BP Location:  Left Arm, Patient Position: Sitting, Cuff Size: Normal)   Pulse 79   Temp 98.7 F (37.1 C) (Oral)   Ht 5\' 4"  (1.626 m)   Wt 137 lb (62.1 kg)   SpO2 97%   BMI 23.52 kg/m   BP Readings from Last 3 Encounters:  06/07/19 120/70  04/06/19 127/63  04/05/19 118/81    Wt Readings from Last 3 Encounters:  06/07/19 137 lb (62.1 kg)  02/09/19 138 lb (62.6 kg)  11/16/18 135 lb (61.2 kg)    Physical Exam Vitals reviewed.    Constitutional:      General: She is not in acute distress.    Appearance: Normal appearance. She is not ill-appearing, toxic-appearing or diaphoretic.  HENT:     Nose: Nose normal.     Mouth/Throat:     Mouth: Mucous membranes are moist.  Eyes:     General: No scleral icterus.    Conjunctiva/sclera: Conjunctivae normal.  Cardiovascular:     Rate and Rhythm: Normal rate and regular rhythm.     Heart sounds: No murmur.  Pulmonary:     Effort: Pulmonary effort is normal.     Breath sounds: No stridor. No wheezing, rhonchi or rales.  Abdominal:     General: Abdomen is flat. Bowel sounds are normal. There is no distension.     Palpations: Abdomen is soft. There is no hepatomegaly or splenomegaly.     Tenderness: There is no abdominal tenderness.  Musculoskeletal:        General: Normal range of motion.     Cervical back: Neck supple.     Right lower leg: No edema.     Left lower leg: No edema.  Lymphadenopathy:     Cervical: No cervical adenopathy.  Skin:    General: Skin is warm and dry.  Neurological:     General: No focal deficit present.     Mental Status: She is alert and oriented to person, place, and time. Mental status is at baseline.  Psychiatric:        Mood and Affect: Mood normal.     Lab Results  Component Value Date   WBC 6.2 04/06/2019   HGB 12.5 04/06/2019   HCT 39.8 04/06/2019   PLT 169 04/06/2019   GLUCOSE 107 (H) 04/06/2019   CHOL 229 (H) 11/16/2018   TRIG 131.0 11/16/2018   HDL 57.50 11/16/2018   LDLDIRECT 102.0 07/29/2015   LDLCALC 145 (H) 11/16/2018   ALT 16 04/06/2019   AST 18 04/06/2019   NA 137 04/06/2019   K 4.0 04/06/2019   CL 105 04/06/2019   CREATININE 0.71 04/06/2019   BUN 11 04/06/2019   CO2 23 04/06/2019   TSH 1.74 11/16/2018   HGBA1C 6.1 11/16/2018   MICROALBUR 18.2 (H) 11/17/2018    DG Chest Port 1 View  Result Date: 04/06/2019 CLINICAL DATA:  Weakness.  COVID-19 positive. EXAM: PORTABLE CHEST 1 VIEW COMPARISON:  Chest  x-ray from yesterday. FINDINGS: The patient is rotated to the left. The heart size and mediastinal contours are within normal limits. Normal pulmonary vascularity. New subtle increased opacity at the peripheral left lung base. No pleural effusion or pneumothorax. No acute osseous abnormality. IMPRESSION: 1. New subtle increased opacity at the left lung base, concerning for developing pneumonia. Electronically Signed   By: Titus Dubin M.D.   On: 04/06/2019 17:15    Assessment & Plan:   Betzabeth was seen today for hypertension.  Diagnoses and all orders for this visit:  Essential  hypertension- Her blood pressure is adequately well controlled.  Recent electrolytes and renal function were normal.  History of COVID-19- Her only lingering symptom is mild myalgias that are improving.  I offered her reassurance and encouraged her to stay active and let me know if she develops any new or worsening symptoms.   I am having Nichole Silva maintain her cetirizine, acetaminophen, ProAir HFA, dicyclomine, furosemide, Vitamin D (Ergocalciferol), fluticasone, NIFEdipine, Breo Ellipta, losartan, ALPRAZolam, and atorvastatin.  No orders of the defined types were placed in this encounter.    Follow-up: Return in about 6 months (around 12/05/2019).  Scarlette Calico, MD

## 2019-06-07 NOTE — Patient Instructions (Signed)
COVID-19 Vaccine Information can be found at: [email or web address] For questions related to vaccine distribution or appointments, please email [email or web address] or call [phone]    

## 2019-06-21 DIAGNOSIS — Z960 Presence of urogenital implants: Secondary | ICD-10-CM | POA: Diagnosis not present

## 2019-06-21 DIAGNOSIS — N819 Female genital prolapse, unspecified: Secondary | ICD-10-CM | POA: Diagnosis not present

## 2019-06-21 DIAGNOSIS — Z1231 Encounter for screening mammogram for malignant neoplasm of breast: Secondary | ICD-10-CM | POA: Diagnosis not present

## 2019-07-27 ENCOUNTER — Other Ambulatory Visit: Payer: Self-pay | Admitting: Internal Medicine

## 2019-07-27 DIAGNOSIS — F419 Anxiety disorder, unspecified: Secondary | ICD-10-CM

## 2019-07-27 MED ORDER — ALPRAZOLAM 1 MG PO TABS: 1.0000 mg | ORAL_TABLET | Freq: Three times a day (TID) | ORAL | 2 refills | Status: DC | PRN

## 2019-07-31 ENCOUNTER — Ambulatory Visit: Payer: Medicare Other | Attending: Internal Medicine

## 2019-07-31 DIAGNOSIS — Z23 Encounter for immunization: Secondary | ICD-10-CM

## 2019-07-31 NOTE — Progress Notes (Signed)
   Covid-19 Vaccination Clinic  Name:  Nichole Silva    MRN: [id number] DOB: Apr 20, 1935  07/31/2019  [name] was observed post Covid-19 immunization for 15 minutes without incident. She was provided with Vaccine Information Sheet and instruction to access the V-Safe system.   [name] was instructed to call 911 with any severe reactions post vaccine: Marland Kitchen Difficulty breathing  . Swelling of face and throat  . A fast heartbeat  . A bad rash all over body  . Dizziness and weakness   Immunizations Administered    Name Date Dose VIS Date Route   Pfizer COVID-19 Vaccine 07/31/2019  1:21 PM 0.3 mL 05/05/2019 Intramuscular   Manufacturer: [place]   Lot: [id number]   [place]: [id number]

## 2019-08-08 ENCOUNTER — Ambulatory Visit (INDEPENDENT_AMBULATORY_CARE_PROVIDER_SITE_OTHER): Payer: Medicare Other

## 2019-08-08 ENCOUNTER — Other Ambulatory Visit: Payer: Self-pay

## 2019-08-08 ENCOUNTER — Ambulatory Visit (INDEPENDENT_AMBULATORY_CARE_PROVIDER_SITE_OTHER): Payer: Medicare Other | Admitting: Family

## 2019-08-08 VITALS — BP 116/72 | HR 88 | Temp 97.9°F | Ht 64.0 in | Wt 133.8 lb

## 2019-08-08 DIAGNOSIS — R29898 Other symptoms and signs involving the musculoskeletal system: Secondary | ICD-10-CM

## 2019-08-08 DIAGNOSIS — M545 Low back pain: Secondary | ICD-10-CM | POA: Diagnosis not present

## 2019-08-08 DIAGNOSIS — R531 Weakness: Secondary | ICD-10-CM

## 2019-08-08 LAB — CBC WITH DIFFERENTIAL/PLATELET
Basophils Absolute: 0 10*3/uL (ref 0.0–0.1)
Basophils Relative: 0.5 % (ref 0.0–3.0)
Eosinophils Absolute: 0.2 10*3/uL (ref 0.0–0.7)
Eosinophils Relative: 2.7 % (ref 0.0–5.0)
HCT: 37.9 % (ref 36.0–46.0)
Hemoglobin: 12.6 g/dL (ref 12.0–15.0)
Lymphocytes Relative: 20.8 % (ref 12.0–46.0)
Lymphs Abs: 1.6 10*3/uL (ref 0.7–4.0)
MCHC: 33.2 g/dL (ref 30.0–36.0)
MCV: 97.7 fl (ref 78.0–100.0)
Monocytes Absolute: 0.8 10*3/uL (ref 0.1–1.0)
Monocytes Relative: 9.9 % (ref 3.0–12.0)
Neutro Abs: 5.1 10*3/uL (ref 1.4–7.7)
Neutrophils Relative %: 66.1 % (ref 43.0–77.0)
Platelets: 271 10*3/uL (ref 150.0–400.0)
RBC: 3.88 Mil/uL (ref 3.87–5.11)
RDW: 13.1 % (ref 11.5–15.5)
WBC: 7.7 10*3/uL (ref 4.0–10.5)

## 2019-08-08 LAB — COMPREHENSIVE METABOLIC PANEL
ALT: 13 U/L (ref 0–35)
AST: 18 U/L (ref 0–37)
Albumin: 4.1 g/dL (ref 3.5–5.2)
Alkaline Phosphatase: 60 U/L (ref 39–117)
BUN: 20 mg/dL (ref 6–23)
CO2: 28 mEq/L (ref 19–32)
Calcium: 10.1 mg/dL (ref 8.4–10.5)
Chloride: 104 mEq/L (ref 96–112)
Creatinine, Ser: 0.72 mg/dL (ref 0.40–1.20)
GFR: 77.42 mL/min (ref >60.00)
Glucose, Bld: 107 mg/dL — ABNORMAL HIGH (ref 70–99)
Potassium: 3.8 mEq/L (ref 3.5–5.1)
Sodium: 138 mEq/L (ref 135–145)
Total Bilirubin: 0.3 mg/dL (ref 0.2–1.2)
Total Protein: 7.5 g/dL (ref 6.0–8.3)

## 2019-08-08 LAB — VITAMIN B12: Vitamin B-12: 286 pg/mL (ref 211–911)

## 2019-08-08 LAB — TSH: TSH: 2.01 u[IU]/mL (ref 0.35–4.50)

## 2019-08-08 IMAGING — DX DG LUMBAR SPINE 2-3V
3 series · 3 of 3 positions shown · non-contrast
Comparison: CT 05/27/2016

CLINICAL DATA: Chronic back pain and leg weakness

EXAM:
LUMBAR SPINE - 2-3 VIEW

[lumbar spine ap]
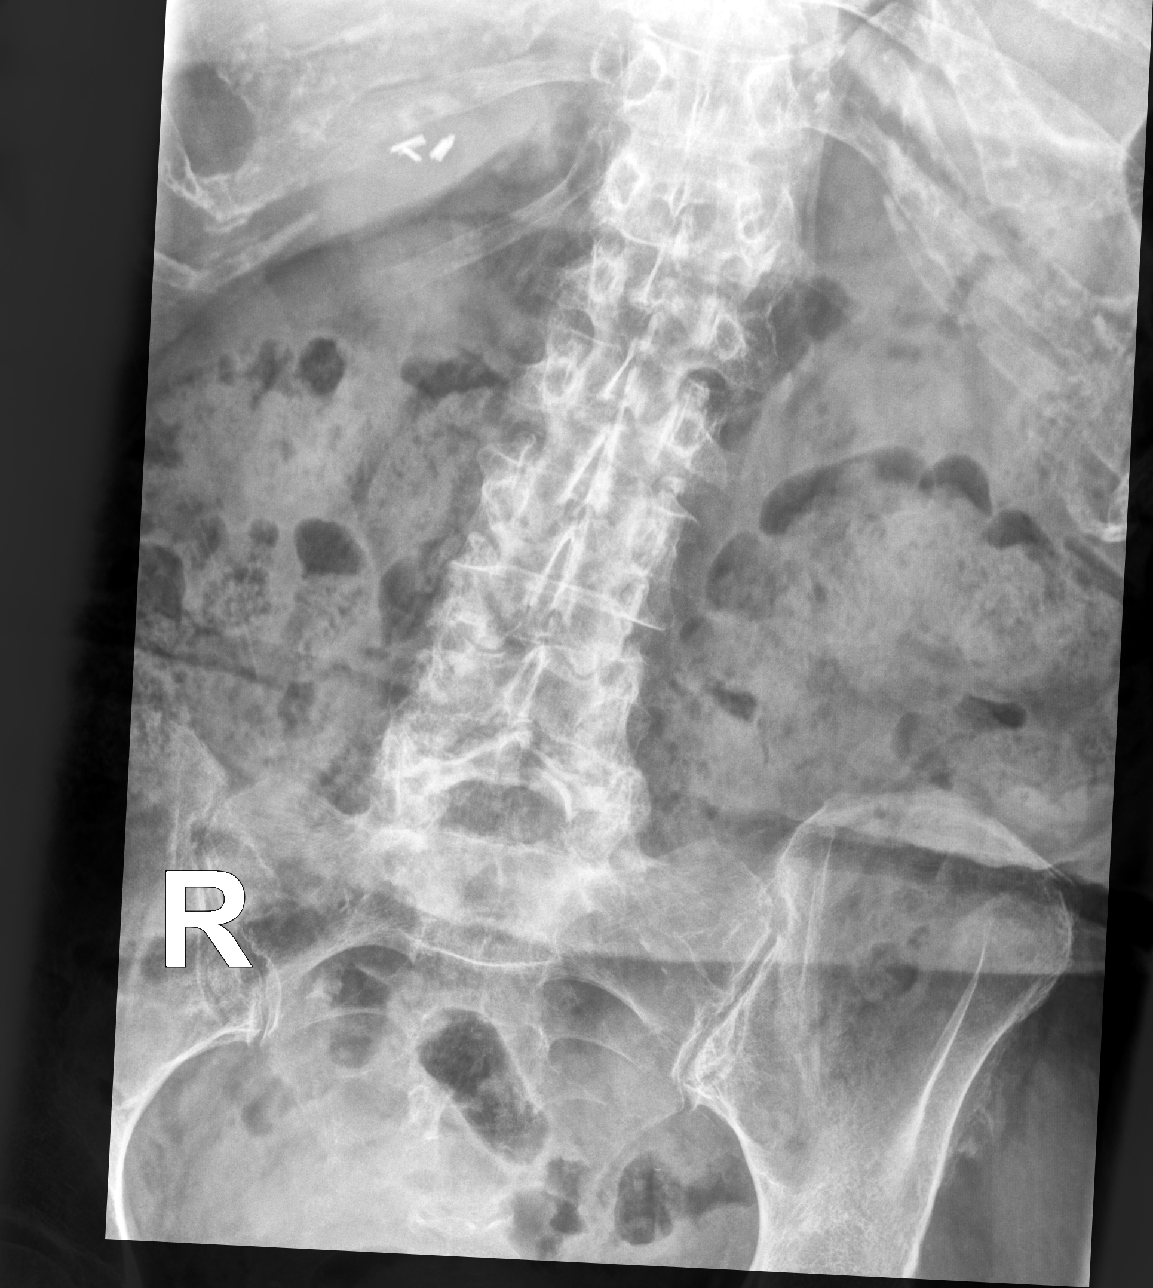

[lumbar spine lat (1 of 2)]
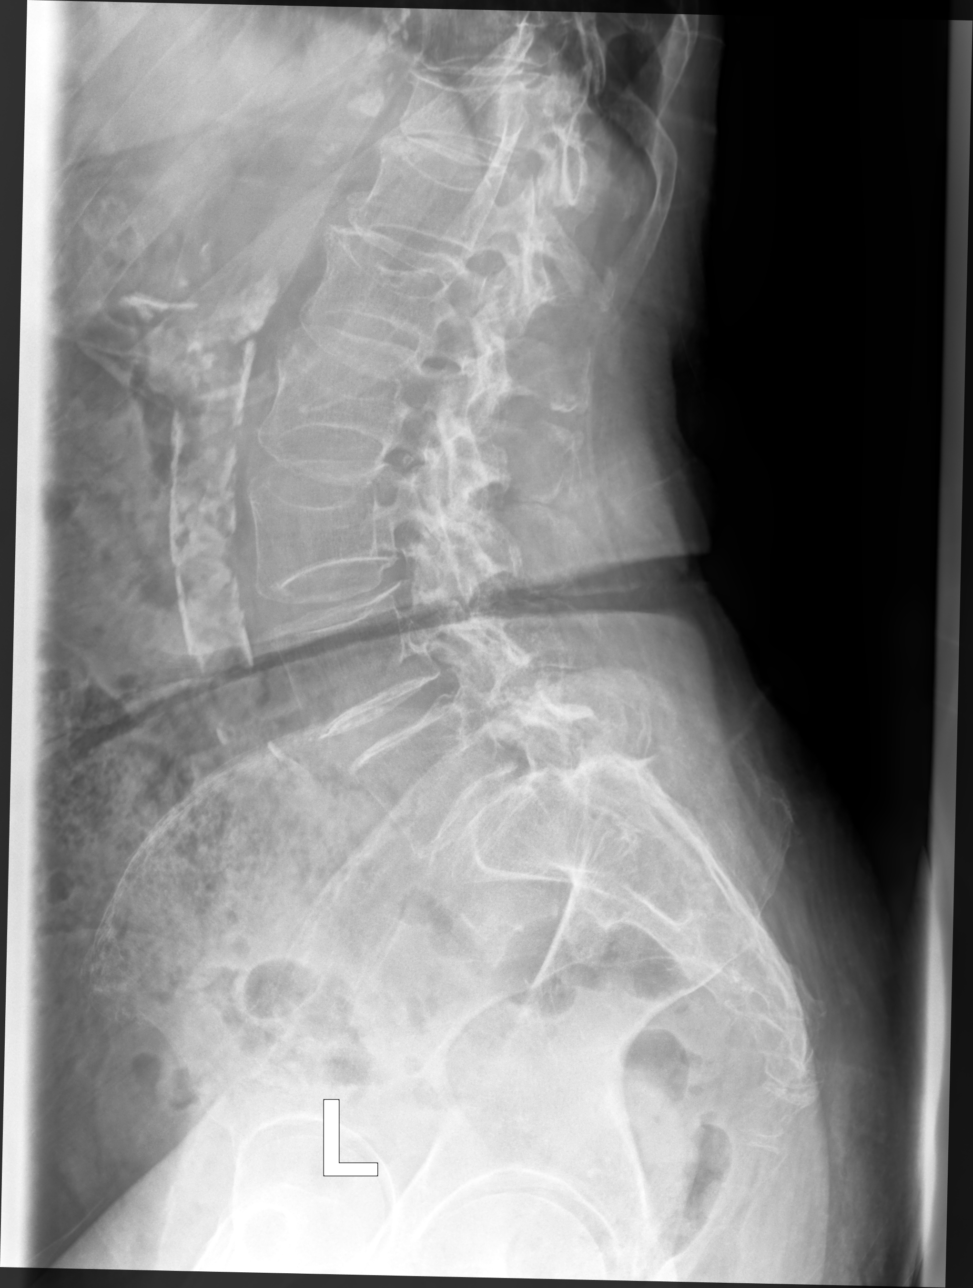

[lumbar spine lat (2 of 2)]
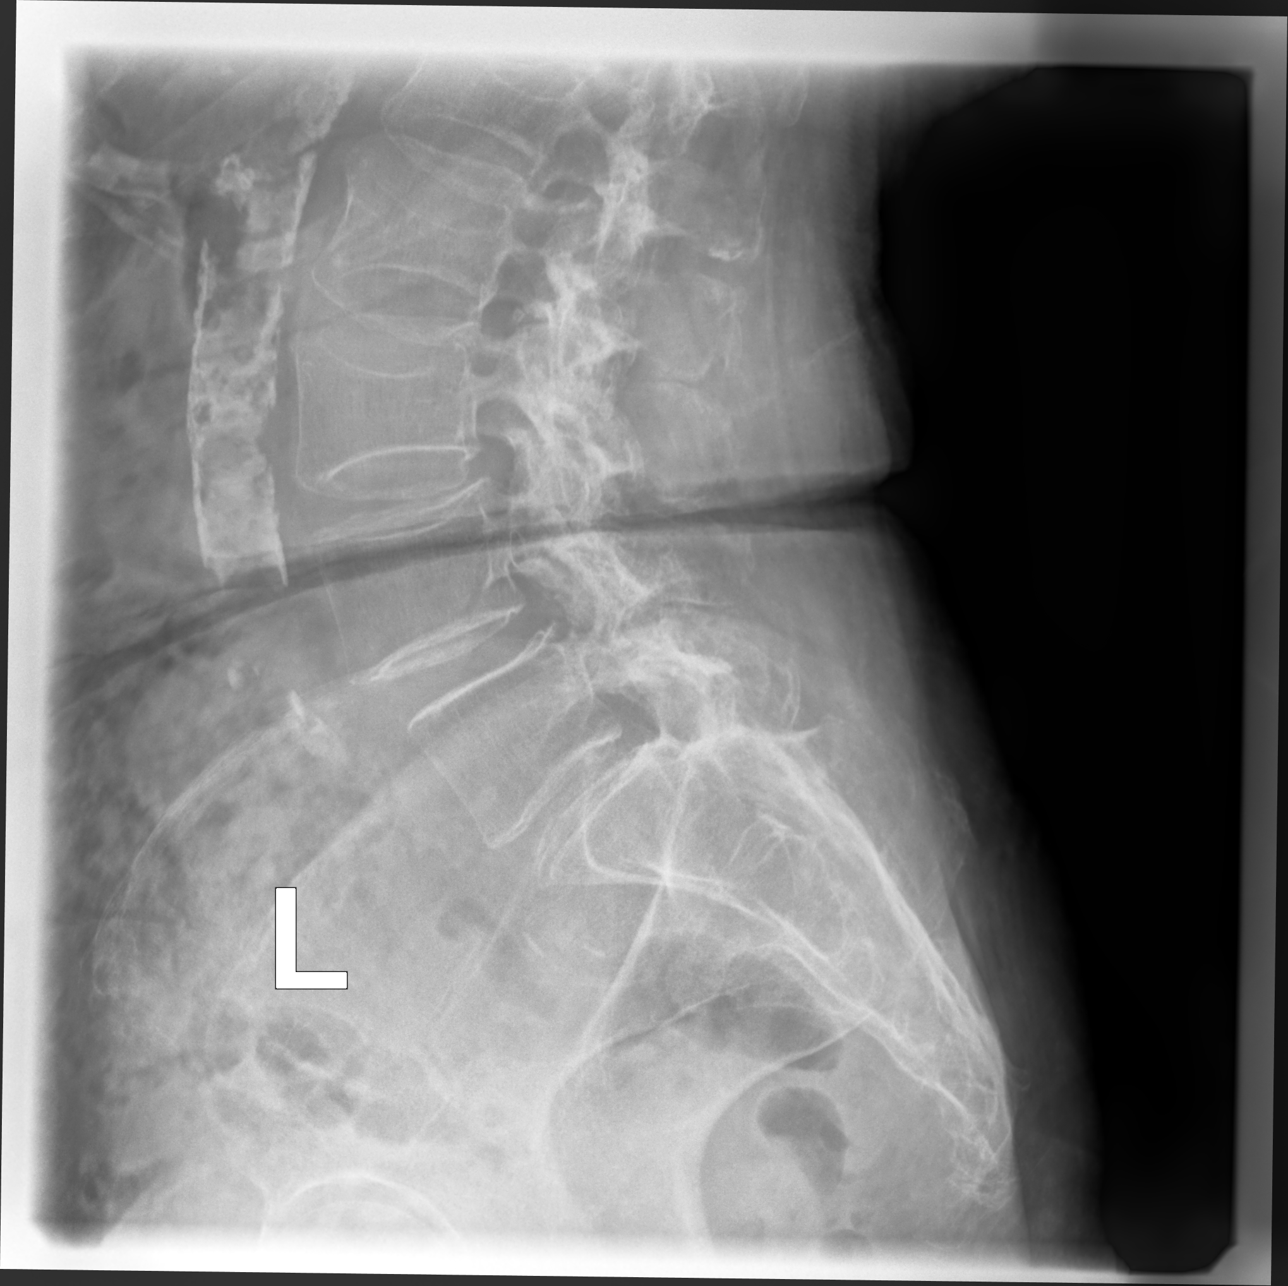

[3 of 3 positions shown; findings below may reference images not displayed]

FINDINGS: Diffuse osseous demineralization. Mild superior endplate compression
deformity of T11 with approximately 25% vertebral body height loss,
new from 05/27/2016. Mild superior endplate irregularity of L1 is
unchanged from prior. Chronic grade 1 anterolisthesis L5 on S1.
Intervertebral disc spaces are relatively preserved. Lower lumbar
facet arthrosis. Prominent abdominal aortic atherosclerotic
calcification.
IMPRESSION: 1. Mild superior endplate compression deformity of T11 with
approximately 25% vertebral body height loss, new from 05/27/2016.
MRI could be performed to further assess the acuity of this finding,
as clinically indicated.
2. Chronic grade 1 anterolisthesis L5 on S1.
3. Diffuse osseous demineralization.

These results will be called to the ordering clinician or
representative by the Radiologist Assistant, and communication
documented in the PACS or [REDACTED].

## 2019-08-08 NOTE — Progress Notes (Signed)
Nichole Silva is a 85 y.o. female with the following history as recorded in EpicCare:  Patient Active Problem List   Diagnosis Date Noted  . Dementia without behavioral disturbance ([place]) 11/17/2018  . Estrogen deficiency 03/08/2017  . Age-related osteoporosis with current pathological fracture 03/08/2017  . Eczema 05/28/2016  . Asthma, moderate persistent 07/29/2015  . IBS (irritable bowel syndrome) 01/30/2015  . Pessary maintenance 10/25/2013  . Cystocele with uterine prolapse 10/25/2013  . Anxiety 10/06/2013  . Routine general medical examination at a health care facility 09/14/2013  . Vitamin D deficiency 02/25/2009  . DEGENERATIVE JOINT DISEASE 02/27/2008  . Type II diabetes mellitus with manifestations ([place]) 09/02/2007  . Hyperlipidemia with target LDL less than 100 07/05/2007  . Essential hypertension 07/05/2007  . Coronary atherosclerosis 07/05/2007    Current Outpatient Medications  Medication Sig Dispense Refill  . acetaminophen (TYLENOL) 325 MG tablet Take 325 mg by mouth every 6 (six) hours as needed for mild pain or moderate pain. Pain/headache    . ALPRAZolam (XANAX) 1 MG tablet Take 1 tablet (1 mg total) by mouth 3 (three) times daily as needed for anxiety. 90 tablet 2  . atorvastatin (LIPITOR) 40 MG tablet TAKE 1 TABLET (40 MG TOTAL) BY MOUTH DAILY AT 6 PM. 90 tablet 1  . BREO ELLIPTA 200-25 MCG/INH AEPB INHALE 1 PUFF BY MOUTH EVERY DAY (Patient taking differently: Inhale 1 puff into the lungs daily. ) 120 each 1  . cetirizine (ZYRTEC) 10 MG tablet Take 10 mg by mouth daily.      Marland Kitchen dicyclomine (BENTYL) 10 MG capsule TAKE ONE CAPSULE BY MOUTH 3 TIMES A DAY BEFORE MEALS (Patient taking differently: Take 10 mg by mouth 3 (three) times daily before meals. ) 270 capsule 2  . fluticasone (FLONASE) 50 MCG/ACT nasal spray Place 2 sprays into both nostrils at bedtime. 48 mL 1  . furosemide (LASIX) 40 MG tablet TAKE 1 TO 2 TABLETS (40-80 MG TOTAL) BY MOUTH DAILY. (Patient taking  differently: Take 40-80 mg by mouth daily. ) 170 tablet 0  . losartan (COZAAR) 50 MG tablet TAKE 1 TABLET BY MOUTH EVERY DAY (Patient taking differently: Take 50 mg by mouth daily. ) 90 tablet 1  . NIFEdipine (ADALAT CC) 60 MG 24 hr tablet Take 60 mg by mouth daily.    Marland Kitchen NIFEdipine (NIFEDICAL XL) 60 MG 24 hr tablet Take 1 tablet (60 mg total) by mouth daily. 90 tablet 1  . PROAIR HFA 108 (90 Base) MCG/ACT inhaler INHALE 1 TO 2 PUFFS BY MOUTH EVERY 4 HOURS AS NEEDED FOR WHEEZE OR SHORTNESS OF BREATH (Patient taking differently: Inhale 1-2 puffs into the lungs every 4 (four) hours as needed for wheezing or shortness of breath. ) 8.5 Inhaler 11  . Vitamin D, Ergocalciferol, (DRISDOL) 1.25 MG (50000 UT) CAPS capsule Take 1 capsule (50,000 Units total) by mouth every 7 (seven) days. 90 capsule 0   No current facility-administered medications for this visit.    Allergies: Codeine, Metformin and related, and Penicillins  Past Medical History:  Diagnosis Date  . Allergic rhinitis, cause unspecified   . Anxiety state, unspecified   . Asthma   . Coronary atherosclerosis of unspecified type of vessel, native or graft   . Depression   . Female bladder prolapse   . Hyperlipidemia   . IBS (irritable bowel syndrome)   . Osteoarthrosis, unspecified whether generalized or localized, unspecified site   . Type II or unspecified type diabetes mellitus without mention of complication, not  stated as uncontrolled   . Unspecified chronic bronchitis ([place])   . Unspecified essential hypertension   . Unspecified hearing loss   . Unspecified venous (peripheral) insufficiency   . Unspecified vitamin D deficiency     Past Surgical History:  Procedure Laterality Date  . Cataract surg  2009  . CHOLECYSTECTOMY  1997  . DILATION AND CURETTAGE OF UTERUS  1960's  . Left elbow surgery  1992    Family History  Problem Relation Age of Onset  . Emphysema Mother   . Heart disease Father   . Colon cancer Sister   .  Colon cancer Other   . Pancreatic cancer Other     Social History   Tobacco Use  . Smoking status: Never Smoker  . Smokeless tobacco: Never Used  Substance Use Topics  . Alcohol use: No    Alcohol/week: 0.0 standard drinks    Subjective:  Patient is concerned that her legs "feel weak" - very limited historian; does not hear well and is unaccompanied today; does have chronic swelling in her legs and uses compression stockings- actually wearing panty hose today; was able to walk into office unassisted today- does have a cane at home apparently but did not feel that she needed to use today;  Notes that symptoms are improved today but was concerned that her symptoms are COVID related;     Objective:  Vitals:   08/08/19 1119  BP: 116/72  Pulse: 88  Temp: 97.9 F (36.6 C)  TempSrc: Oral  SpO2: 97%  Weight: 133 lb 12.8 oz (60.7 kg)  Height: 5' 4" (1.626 m)    General: Well developed, well nourished, in no acute distress  Skin : Warm and dry.  Head: Normocephalic and atraumatic  Lungs: Respirations unlabored; clear to auscultation bilaterally without wheeze, rales, rhonchi  Musculoskeletal: No deformities; no active joint inflammation  Extremities: No edema, cyanosis, clubbing  Vessels: Symmetric bilaterally  Neurologic: Alert and oriented; speech intact; face symmetrical; moves all extremities well; CNII-XII intact without focal deficit   Assessment:  1. Weakness   2. Weakness of both lower extremities     Plan:  Physical exam is very reassuring today; explained to patient that do not see any concerns for complications from [place];  Update lumbar X-ray and labs today; will plan for home PT as well; follow up to be determined.   This visit occurred during the SARS-CoV-2 public health emergency.  Safety protocols were in place, including screening questions prior to the visit, additional usage of staff PPE, and extensive cleaning of exam room while observing appropriate contact  time as indicated for disinfecting solutions.     No follow-ups on file.  Orders Placed This Encounter  Procedures  . DG Lumbar Spine 2-3 Views    Standing Status:   Future    Number of Occurrences:   1    Standing Expiration Date:   10/07/2020    Order Specific Question:   Reason for Exam (SYMPTOM  OR DIAGNOSIS REQUIRED)    Answer:   lower extremity weakness    Order Specific Question:   Preferred imaging location?    Answer:   [facility]    Order Specific Question:   Radiology Contrast Protocol - do NOT remove file path    Answer:   _0 [email or web address]  . B12  . TSH  . CBC with Differential/Platelet  . Comp Met (CMET)    Requested Prescriptions    No prescriptions requested or ordered in this  encounter

## 2019-08-09 ENCOUNTER — Other Ambulatory Visit: Payer: Self-pay | Admitting: Family

## 2019-08-09 DIAGNOSIS — R9389 Abnormal findings on diagnostic imaging of other specified body structures: Secondary | ICD-10-CM

## 2019-08-09 DIAGNOSIS — S22080A Wedge compression fracture of T11-T12 vertebra, initial encounter for closed fracture: Secondary | ICD-10-CM

## 2019-09-05 ENCOUNTER — Ambulatory Visit: Payer: Medicare Other | Attending: Internal Medicine

## 2019-09-05 DIAGNOSIS — Z23 Encounter for immunization: Secondary | ICD-10-CM

## 2019-09-05 NOTE — Progress Notes (Signed)
   Covid-19 Vaccination Clinic  Name:  Deniqua Shroff    MRN: [id number] DOB: 10-05-1934  09/05/2019  Ms. Moulin was observed post Covid-19 immunization for 15 minutes without incident. She was provided with Vaccine Information Sheet and instruction to access the V-Safe system.   Ms. Silverio was instructed to call 911 with any severe reactions post vaccine: Marland Kitchen Difficulty breathing  . Swelling of face and throat  . A fast heartbeat  . A bad rash all over body  . Dizziness and weakness   Immunizations Administered    Name Date Dose VIS Date Route   Pfizer COVID-19 Vaccine 09/05/2019  2:00 PM 0.3 mL 05/05/2019 Intramuscular   Manufacturer: [employer] [place]   Lot: [id number]   [place]: [id number]

## 2019-09-13 ENCOUNTER — Ambulatory Visit
Admission: RE | Admit: 2019-09-13 | Discharge: 2019-09-13 | Disposition: A | Discharge status: Home / Self Care | Payer: Medicare Other | Source: Ambulatory Visit | Attending: Family | Admitting: Family

## 2019-09-13 ENCOUNTER — Other Ambulatory Visit: Payer: Self-pay

## 2019-09-13 DIAGNOSIS — R9389 Abnormal findings on diagnostic imaging of other specified body structures: Secondary | ICD-10-CM

## 2019-09-13 DIAGNOSIS — S22080A Wedge compression fracture of T11-T12 vertebra, initial encounter for closed fracture: Secondary | ICD-10-CM

## 2019-09-13 DIAGNOSIS — M546 Pain in thoracic spine: Secondary | ICD-10-CM | POA: Diagnosis not present

## 2019-09-13 IMAGING — MR MR THORACIC SPINE W/O CM
4 of 6 series · 16 of 48 positions shown · non-contrast
Comparison: Chest radiographs 05/15/2013.

CLINICAL DATA: 84-year-old female with mid back pain. Abnormal
x-ray with possible T11 compression fracture.

EXAM:
MRI THORACIC SPINE WITHOUT CONTRAST
TECHNIQUE: Multiplanar, multisequence MR imaging of the thoracic spine was
performed. No intravenous contrast was administered.

[Series 5: T2 · sagittal · 4.0mm · 0.33mm/px · 4 of 12 slices shown (1 of 3)]
[im 1/12]
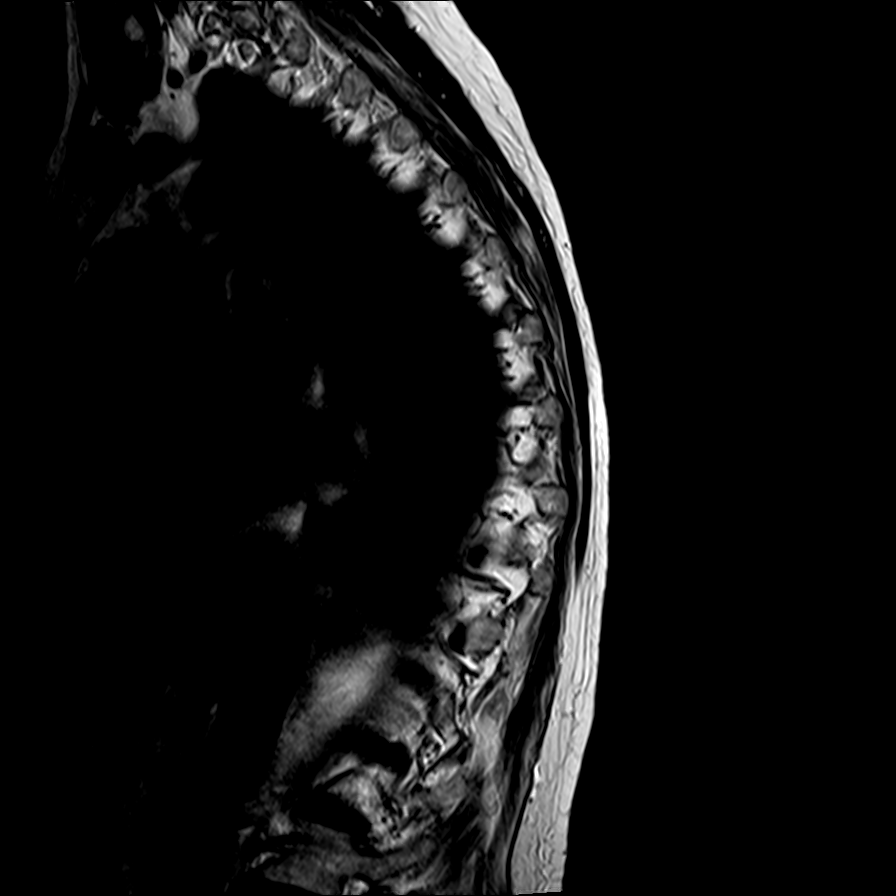
[im 4/12]
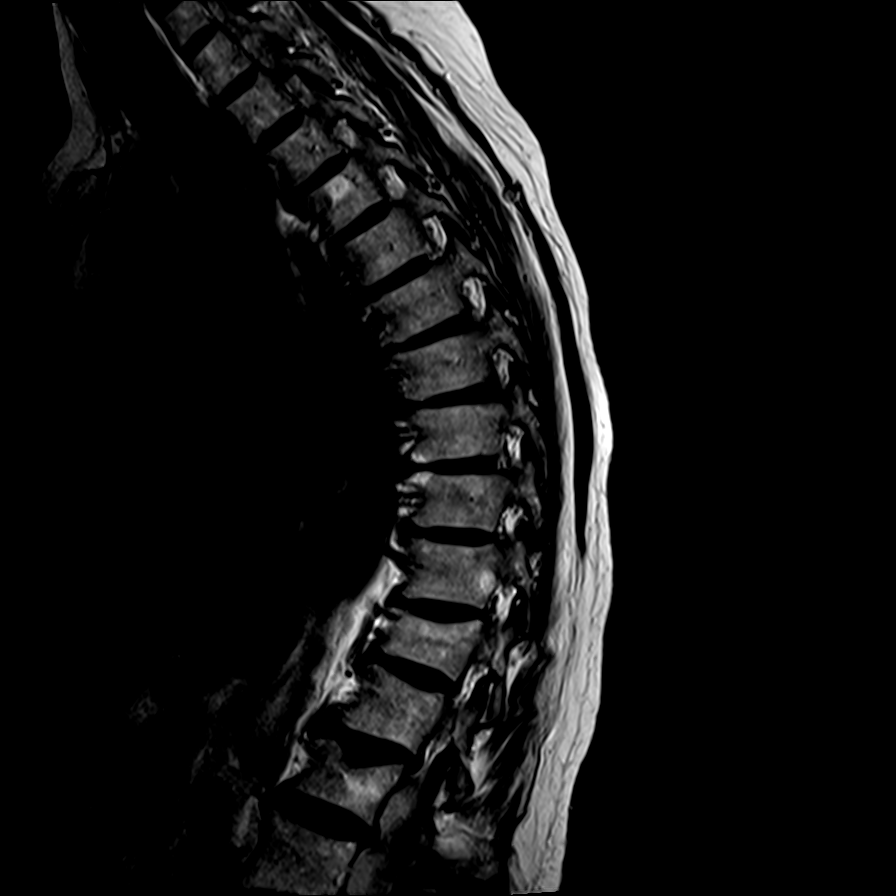
[im 8/12]
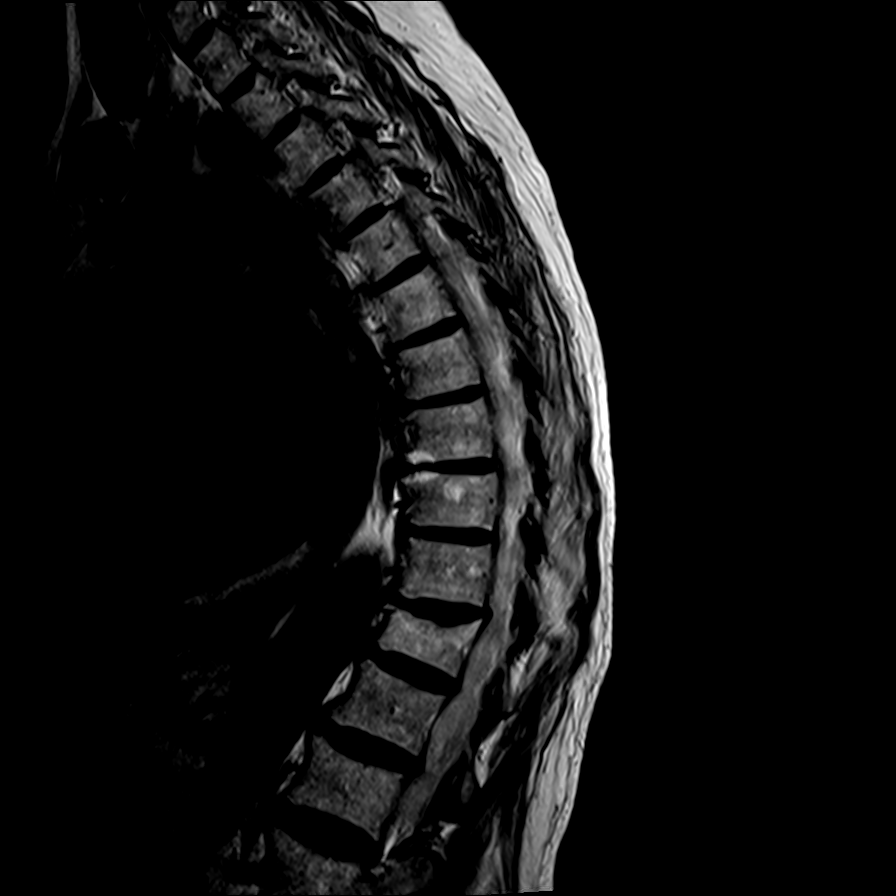
[im 12/12]
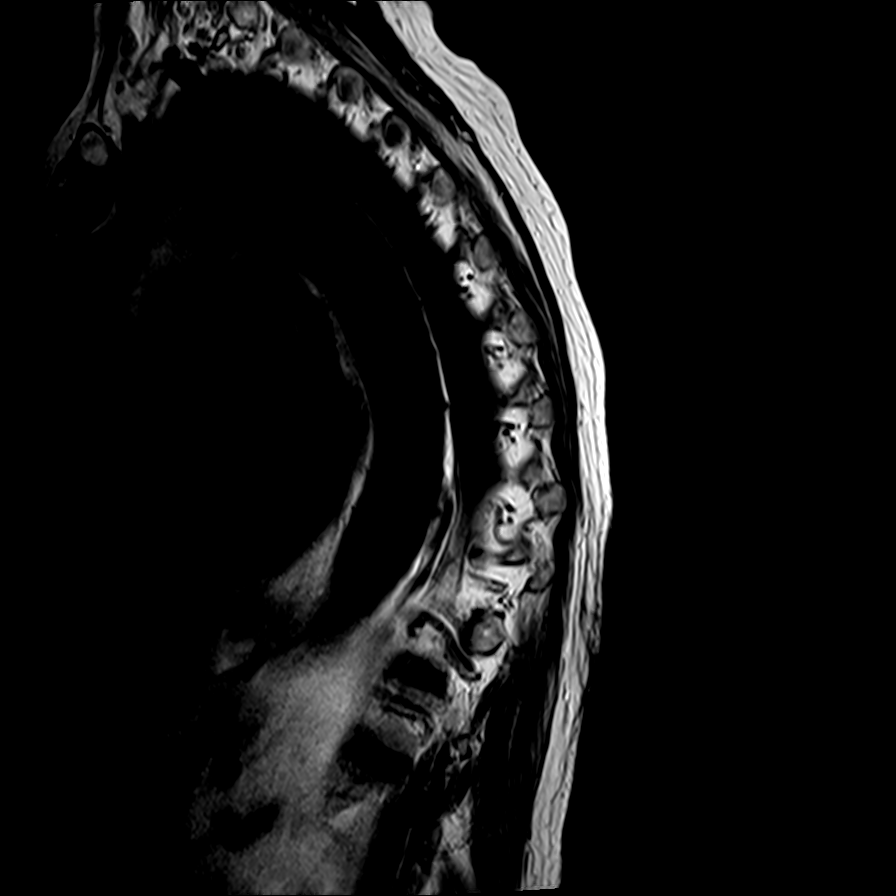

[Series 6: T1 · sagittal · 4.0mm · 0.67mm/px · 3 of 12 slices shown]
[im 1/12]
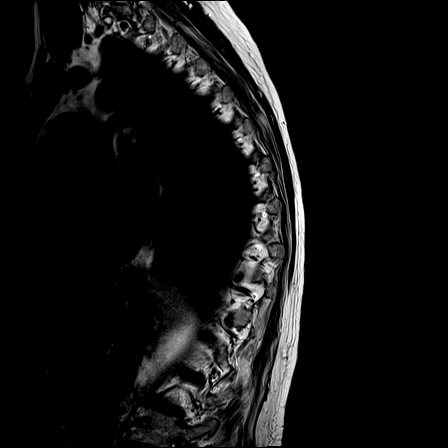
[im 6/12]
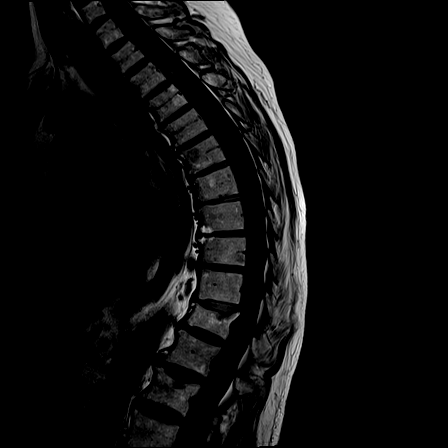
[im 12/12]
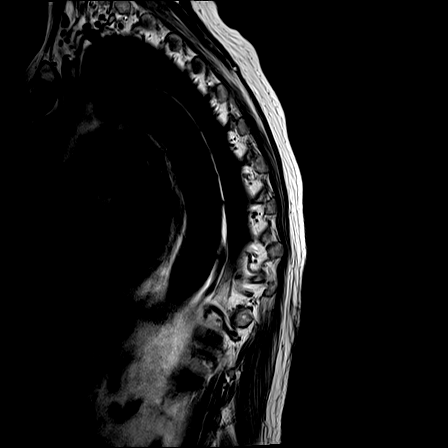

[Series 8: T2 · axial · 4.0mm · 0.52mm/px · z∈[-172,-27]mm · 6 of 40 slices shown (2 of 3)]
[im 3/40]
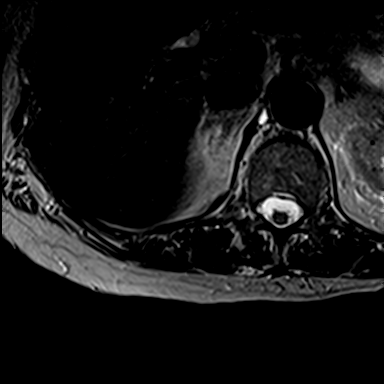
[im 6/40]
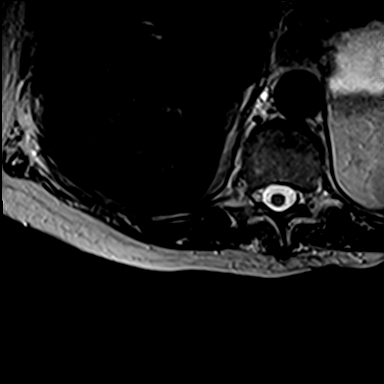
[im 8/40]
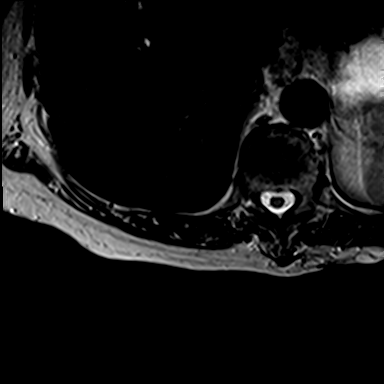
[im 14/40]
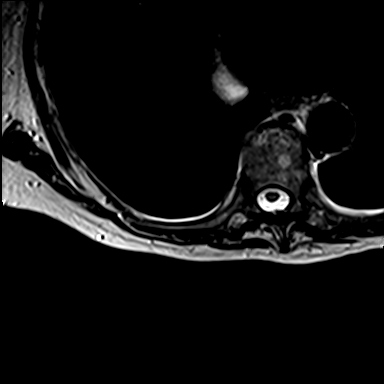
[im 21/40]
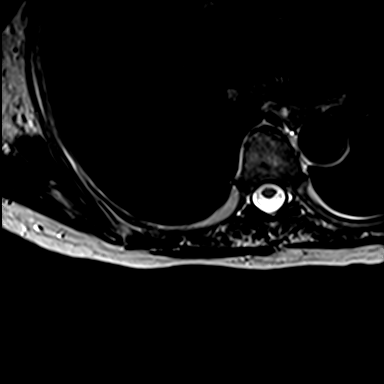
[im 34/40]
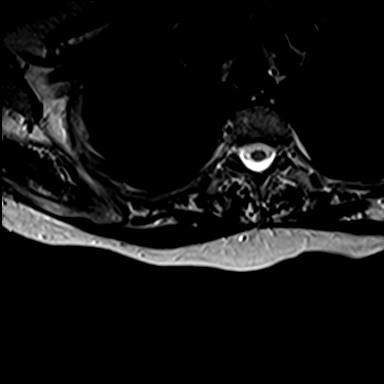

[Series 9: T2 · axial · 4.0mm · 0.52mm/px · z∈[-162,-24]mm · 3 of 40 slices shown (3 of 3)]
[im 6/40]
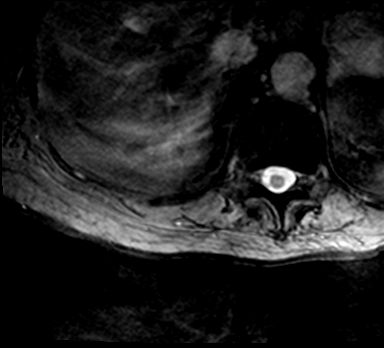
[im 21/40]
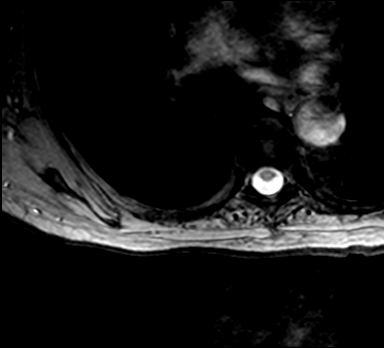
[im 34/40]
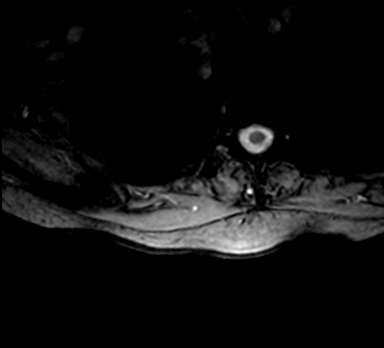

[16 of 48 positions shown; findings below may reference images not displayed]

FINDINGS: Limited cervical spine imaging: Preserved cervical lordosis. C4-C5
and C5-C6 disc degeneration with possible mild spinal stenosis at
those levels.

Thoracic spine segmentation: Appears to be normal, probably with
hypoplastic ribs at T12.

Alignment: Mildly exaggerated thoracic kyphosis. No
spondylolisthesis.

Vertebrae: Mild T11 superior endplate compression with minimal if
any associated marrow edema. Minor retropulsion of the
posterosuperior endplate without associated stenosis.

No marrow edema elsewhere in the thoracic spine. Normal background
bone marrow signal. Chronic degenerative endplate changes anteriorly
at T8-T9.

Mild chronic L1 superior endplate compression.

Cord: Capacious thoracic spinal canal. Normal thoracic spinal cord
signal and morphology. Normal conus medullaris at T12-L1.

Paraspinal and other soft tissues: Trace bilateral pleural fluid.
Otherwise negative visible thoracic and upper abdominal viscera.
Paraspinal soft tissues are within normal limits.

Disc levels:

The levels T1-T2 through T7-T8 are normal for age.

T8-T9: Mild disc bulge with mild to moderate facet hypertrophy. Mild
to moderate left and mild right T8 foraminal stenosis.

T9-T10: Mild disc bulge and facet hypertrophy without stenosis.

T10-T11: Mild disc bulge and retropulsion of the T11 posterosuperior
endplate, but no stenosis.

T11-T12: Negative.

T12-L1: Circumferential disc bulge with endplate spurring. No
stenosis.
IMPRESSION: 1. Confirmed T11 compression fracture with mild loss of height, but
this appears chronic. Minimal retropulsion of bone not resulting in
stenosis.
2. No acute osseous abnormality in the thoracic spine.
3. Capacious thoracic spinal canal and generally mild for age
thoracic spine degeneration. No spinal stenosis. There is mild to
moderate degenerative T8 neural foraminal stenosis.
4. Trace bilateral pleural fluid.
5. C4-C5 and C5-C6 disc and endplate degeneration with suspected
mild cervical spinal stenosis.

## 2019-09-14 ENCOUNTER — Other Ambulatory Visit: Payer: Self-pay | Admitting: Family

## 2019-09-14 DIAGNOSIS — S22000A Wedge compression fracture of unspecified thoracic vertebra, initial encounter for closed fracture: Secondary | ICD-10-CM

## 2019-09-20 ENCOUNTER — Other Ambulatory Visit: Payer: Self-pay | Admitting: Internal Medicine

## 2019-09-20 DIAGNOSIS — J4540 Moderate persistent asthma, uncomplicated: Secondary | ICD-10-CM

## 2019-09-21 ENCOUNTER — Other Ambulatory Visit: Payer: Self-pay | Admitting: Internal Medicine

## 2019-09-21 DIAGNOSIS — E1165 Type 2 diabetes mellitus with hyperglycemia: Secondary | ICD-10-CM

## 2019-09-21 DIAGNOSIS — IMO0002 Reserved for concepts with insufficient information to code with codable children: Secondary | ICD-10-CM

## 2019-09-21 DIAGNOSIS — I10 Essential (primary) hypertension: Secondary | ICD-10-CM

## 2019-10-24 ENCOUNTER — Other Ambulatory Visit: Payer: Self-pay | Admitting: Internal Medicine

## 2019-10-24 DIAGNOSIS — I2510 Atherosclerotic heart disease of native coronary artery without angina pectoris: Secondary | ICD-10-CM

## 2019-10-24 DIAGNOSIS — F419 Anxiety disorder, unspecified: Secondary | ICD-10-CM

## 2019-10-24 DIAGNOSIS — I10 Essential (primary) hypertension: Secondary | ICD-10-CM

## 2019-10-24 DIAGNOSIS — E785 Hyperlipidemia, unspecified: Secondary | ICD-10-CM

## 2019-12-06 ENCOUNTER — Other Ambulatory Visit: Payer: Self-pay

## 2019-12-06 ENCOUNTER — Encounter: Payer: Self-pay | Admitting: Internal Medicine

## 2019-12-06 ENCOUNTER — Ambulatory Visit (INDEPENDENT_AMBULATORY_CARE_PROVIDER_SITE_OTHER): Payer: Medicare Other | Admitting: Internal Medicine

## 2019-12-06 VITALS — BP 144/68 | HR 94 | Temp 98.3°F | Resp 16 | Ht 64.0 in | Wt 140.0 lb

## 2019-12-06 DIAGNOSIS — E559 Vitamin D deficiency, unspecified: Secondary | ICD-10-CM

## 2019-12-06 DIAGNOSIS — I2510 Atherosclerotic heart disease of native coronary artery without angina pectoris: Secondary | ICD-10-CM | POA: Diagnosis not present

## 2019-12-06 DIAGNOSIS — E118 Type 2 diabetes mellitus with unspecified complications: Secondary | ICD-10-CM

## 2019-12-06 DIAGNOSIS — E785 Hyperlipidemia, unspecified: Secondary | ICD-10-CM | POA: Diagnosis not present

## 2019-12-06 DIAGNOSIS — I10 Essential (primary) hypertension: Secondary | ICD-10-CM | POA: Diagnosis not present

## 2019-12-06 DIAGNOSIS — Z0000 Encounter for general adult medical examination without abnormal findings: Secondary | ICD-10-CM

## 2019-12-06 NOTE — Patient Instructions (Signed)

## 2019-12-06 NOTE — Progress Notes (Signed)
Subjective:  Patient ID: Nichole Silva, female    DOB: November 02, 1934  Age: 85 y.o. MRN: [id number]  CC: Hypertension, Annual Exam, Diabetes, and Hyperlipidemia  This visit occurred during the SARS-CoV-2 public health emergency.  Safety protocols were in place, including screening questions prior to the visit, additional usage of staff PPE, and extensive cleaning of exam room while observing appropriate contact time as indicated for disinfecting solutions.    HPI Nichole Silva presents for a CPX.  She tells me that her blood pressure and blood sugar have been well controlled.  She is active and denies any recent episodes of chest pain, shortness of breath, or palpitations.  She complains of worsening edema around her ankles that causes discomfort.  She denies claudication.  Outpatient Medications Prior to Visit  Medication Sig Dispense Refill  . acetaminophen (TYLENOL) 325 MG tablet Take 325 mg by mouth every 6 (six) hours as needed for mild pain or moderate pain. Pain/headache    . ALPRAZolam (XANAX) 1 MG tablet TAKE 1 TABLET BY MOUTH 3 TIMES DAILY AS NEEDED FOR ANXIETY. 90 tablet 2  . atorvastatin (LIPITOR) 40 MG tablet TAKE 1 TABLET (40 MG TOTAL) BY MOUTH DAILY AT 6 PM. 90 tablet 1  . BREO ELLIPTA 200-25 MCG/INH AEPB INHALE 1 PUFF BY MOUTH EVERY DAY 60 each 3  . cetirizine (ZYRTEC) 10 MG tablet Take 10 mg by mouth daily.      Marland Kitchen dicyclomine (BENTYL) 10 MG capsule TAKE ONE CAPSULE BY MOUTH 3 TIMES A DAY BEFORE MEALS (Patient taking differently: Take 10 mg by mouth 3 (three) times daily before meals. ) 270 capsule 2  . fluticasone (FLONASE) 50 MCG/ACT nasal spray PLACE 2 SPRAYS INTO BOTH NOSTRILS AT BEDTIME 48 mL 1  . furosemide (LASIX) 40 MG tablet TAKE 1 TO 2 TABLETS (40-80 MG TOTAL) BY MOUTH DAILY. (Patient taking differently: Take 40-80 mg by mouth daily. ) 170 tablet 0  . losartan (COZAAR) 50 MG tablet TAKE 1 TABLET BY MOUTH EVERY DAY 90 tablet 1  . PROAIR HFA 108 (90 Base) MCG/ACT inhaler  INHALE 1 TO 2 PUFFS BY MOUTH EVERY 4 HOURS AS NEEDED FOR WHEEZE OR SHORTNESS OF BREATH (Patient taking differently: Inhale 1-2 puffs into the lungs every 4 (four) hours as needed for wheezing or shortness of breath. ) 8.5 Inhaler 11  . Vitamin D, Ergocalciferol, (DRISDOL) 1.25 MG (50000 UT) CAPS capsule Take 1 capsule (50,000 Units total) by mouth every 7 (seven) days. 90 capsule 0  . NIFEdipine (ADALAT CC) 60 MG 24 hr tablet TAKE 1 TABLET BY MOUTH EVERY DAY 90 tablet 1  . NIFEdipine (NIFEDICAL XL) 60 MG 24 hr tablet Take 1 tablet (60 mg total) by mouth daily. 90 tablet 1   No facility-administered medications prior to visit.    ROS Review of Systems  Constitutional: Negative for appetite change, chills, diaphoresis, fatigue and fever.  HENT: Negative.   Eyes: Negative for visual disturbance.  Respiratory: Negative for cough, chest tightness, shortness of breath and wheezing.   Cardiovascular: Positive for leg swelling. Negative for chest pain and palpitations.  Gastrointestinal: Negative for abdominal pain, constipation, diarrhea, nausea and vomiting.  Endocrine: Negative.   Genitourinary: Negative.  Negative for difficulty urinating and dysuria.  Musculoskeletal: Negative for arthralgias, joint swelling and myalgias.  Skin: Negative.  Negative for color change and pallor.  Neurological: Negative.  Negative for dizziness, weakness, light-headedness and headaches.  Hematological: Negative.  Negative for adenopathy. Does not bruise/bleed easily.  Psychiatric/Behavioral: Negative.  Objective:  BP (!) 144/68 (BP Location: Left Arm, Patient Position: Sitting, Cuff Size: Normal)   Pulse 94   Temp 98.3 F (36.8 C) (Oral)   Resp 16   Ht 5\' 4"  (1.626 m)   Wt 140 lb (63.5 kg)   SpO2 96%   BMI 24.03 kg/m   BP Readings from Last 3 Encounters:  12/06/19 (!) 144/68  08/08/19 116/72  06/07/19 120/70    Wt Readings from Last 3 Encounters:  12/06/19 140 lb (63.5 kg)  08/08/19 133 lb  12.8 oz (60.7 kg)  06/07/19 137 lb (62.1 kg)    Physical Exam Constitutional:      Appearance: Normal appearance.  HENT:     Nose: Nose normal.     Mouth/Throat:     Mouth: Mucous membranes are moist.  Eyes:     General: No scleral icterus.    Conjunctiva/sclera: Conjunctivae normal.  Cardiovascular:     Rate and Rhythm: Normal rate and regular rhythm.     Heart sounds: No murmur heard.   Pulmonary:     Effort: Pulmonary effort is normal.     Breath sounds: No stridor. No wheezing, rhonchi or rales.  Abdominal:     General: Abdomen is flat.     Palpations: There is no mass.     Tenderness: There is no abdominal tenderness. There is no guarding.  Musculoskeletal:        General: Normal range of motion.     Cervical back: Neck supple.     Right lower leg: 1+ Pitting Edema present.     Left lower leg: 1+ Pitting Edema present.  Lymphadenopathy:     Cervical: No cervical adenopathy.  Skin:    General: Skin is warm and dry.     Coloration: Skin is not pale.  Neurological:     General: No focal deficit present.     Mental Status: She is alert and oriented to person, place, and time. Mental status is at baseline.  Psychiatric:        Mood and Affect: Mood normal.        Behavior: Behavior normal.     Lab Results  Component Value Date   WBC 7.7 08/08/2019   HGB 12.6 08/08/2019   HCT 37.9 08/08/2019   PLT 271.0 08/08/2019   GLUCOSE 107 (H) 08/08/2019   CHOL 159 12/06/2019   TRIG 137 12/06/2019   HDL 57 12/06/2019   LDLDIRECT 102.0 07/29/2015   LDLCALC 78 12/06/2019   ALT 13 08/08/2019   AST 18 08/08/2019   NA 138 08/08/2019   K 3.8 08/08/2019   CL 104 08/08/2019   CREATININE 0.72 08/08/2019   BUN 20 08/08/2019   CO2 28 08/08/2019   TSH 2.01 08/08/2019   HGBA1C 5.5 12/06/2019   MICROALBUR 13.1 12/07/2019    MR Thoracic Spine Wo Contrast  Result Date: 09/14/2019 CLINICAL DATA:  85 year old female with mid back pain. Abnormal x-ray with possible T11  compression fracture. EXAM: MRI THORACIC SPINE WITHOUT CONTRAST TECHNIQUE: Multiplanar, multisequence MR imaging of the thoracic spine was performed. No intravenous contrast was administered. COMPARISON:  Chest radiographs 05/15/2013. FINDINGS: Limited cervical spine imaging: Preserved cervical lordosis. C4-C5 and C5-C6 disc degeneration with possible mild spinal stenosis at those levels. Thoracic spine segmentation: Appears to be normal, probably with hypoplastic ribs at T12. Alignment: Mildly exaggerated thoracic kyphosis. No spondylolisthesis. Vertebrae: Mild T11 superior endplate compression with minimal if any associated marrow edema. Minor retropulsion of the posterosuperior endplate without associated  stenosis. No marrow edema elsewhere in the thoracic spine. Normal background bone marrow signal. Chronic degenerative endplate changes anteriorly at T8-T9. Mild chronic L1 superior endplate compression. Cord: Capacious thoracic spinal canal. Normal thoracic spinal cord signal and morphology. Normal conus medullaris at T12-L1. Paraspinal and other soft tissues: Trace bilateral pleural fluid. Otherwise negative visible thoracic and upper abdominal viscera. Paraspinal soft tissues are within normal limits. Disc levels: The levels T1-T2 through T7-T8 are normal for age. T8-T9: Mild disc bulge with mild to moderate facet hypertrophy. Mild to moderate left and mild right T8 foraminal stenosis. T9-T10: Mild disc bulge and facet hypertrophy without stenosis. T10-T11: Mild disc bulge and retropulsion of the T11 posterosuperior endplate, but no stenosis. T11-T12: Negative. T12-L1: Circumferential disc bulge with endplate spurring. No stenosis. IMPRESSION: 1. Confirmed T11 compression fracture with mild loss of height, but this appears chronic. Minimal retropulsion of bone not resulting in stenosis. 2. No acute osseous abnormality in the thoracic spine. 3. Capacious thoracic spinal canal and generally mild for age thoracic  spine degeneration. No spinal stenosis. There is mild to moderate degenerative T8 neural foraminal stenosis. 4. Trace bilateral pleural fluid. 5. C4-C5 and C5-C6 disc and endplate degeneration with suspected mild cervical spinal stenosis. Electronically Signed   By: Genevie Ann M.D.   On: 09/14/2019 09:43    Assessment & Plan:   Maanasa was seen today for hypertension, annual exam, diabetes and hyperlipidemia.  Diagnoses and all orders for this visit:  Essential hypertension- Her blood pressure is well controlled.  She complains of symptomatic lower extremity pitting edema.  I have asked her to stop taking the calcium channel blocker.  Will continue the current dose of the loop diuretic and the ARB. -     Cancel: Urinalysis, Routine w reflex microscopic; Future -     Cancel: Urinalysis, Routine w reflex microscopic -     Urinalysis, Routine w reflex microscopic; Future -     Urinalysis, Routine w reflex microscopic  Atherosclerosis of native coronary artery of native heart without angina pectoris- S has had no recent episodes of angina.  Will continue to address risk factor modifications. -     Lipid panel; Future -     Lipid panel  Type II diabetes mellitus with manifestations ([place])- Her A1c is down to 5.5% on no medications.  She is no longer diabetic. -     Hemoglobin A1c; Future -     Cancel: Microalbumin / creatinine urine ratio; Future -     Cancel: Microalbumin / creatinine urine ratio -     Hemoglobin A1c -     Microalbumin / creatinine urine ratio; Future -     Microalbumin / creatinine urine ratio  Hyperlipidemia with target LDL less than 100- She has achieved her LDL goal and is doing well on the statin. -     Lipid panel; Future -     Lipid panel  Vitamin D deficiency- Her vitamin D level is normal now. -     VITAMIN D 25 Hydroxy (Vit-D Deficiency, Fractures); Future -     VITAMIN D 25 Hydroxy (Vit-D Deficiency, Fractures)  Routine general medical examination at a health care  facility- Exam completed, labs reviewed, vaccines reviewed and updated, no cancer screenings are indicated, patient education material was given.  Other orders -     Microalbumin / creatinine urine ratio -     Urinalysis, Routine w reflex microscopic -     MICROSCOPIC MESSAGE   I have discontinued  Keali Wolven's NIFEdipine and NIFEdipine. I am also having her maintain her cetirizine, acetaminophen, ProAir HFA, dicyclomine, furosemide, Vitamin D (Ergocalciferol), Breo Ellipta, losartan, fluticasone, atorvastatin, and ALPRAZolam.  No orders of the defined types were placed in this encounter.    Follow-up: Return in about 6 months (around 06/07/2020).  Scarlette Calico, MD

## 2019-12-07 DIAGNOSIS — E118 Type 2 diabetes mellitus with unspecified complications: Secondary | ICD-10-CM | POA: Diagnosis not present

## 2019-12-07 DIAGNOSIS — I10 Essential (primary) hypertension: Secondary | ICD-10-CM | POA: Diagnosis not present

## 2019-12-07 LAB — URINALYSIS, ROUTINE W REFLEX MICROSCOPIC

## 2019-12-07 LAB — VITAMIN D 25 HYDROXY (VIT D DEFICIENCY, FRACTURES): Vit D, 25-Hydroxy: 56 ng/mL (ref 30–100)

## 2019-12-07 LAB — MICROALBUMIN / CREATININE URINE RATIO

## 2019-12-07 LAB — LIPID PANEL
Cholesterol: 159 mg/dL (ref <200)
HDL: 57 mg/dL (ref >=50)
LDL Cholesterol (Calc): 78 mg/dL (calc)
Non-HDL Cholesterol (Calc): 102 mg/dL (calc) (ref <130)
Total CHOL/HDL Ratio: 2.8 (calc) (ref <5.0)
Triglycerides: 137 mg/dL (ref <150)

## 2019-12-07 LAB — HEMOGLOBIN A1C
Hgb A1c MFr Bld: 5.5 % of total Hgb (ref <5.7)
Mean Plasma Glucose: 111 (calc)
eAG (mmol/L): 6.2 (calc)

## 2019-12-08 LAB — URINALYSIS, ROUTINE W REFLEX MICROSCOPIC
Bilirubin Urine: NEGATIVE
Glucose, UA: NEGATIVE
Ketones, ur: NEGATIVE
Nitrite: POSITIVE — AB
Specific Gravity, Urine: 1.015 (ref 1.001–1.03)
WBC, UA: >=60 /HPF — AB (ref 0–5)
pH: 7 (ref 5.0–8.0)

## 2019-12-08 LAB — MICROALBUMIN / CREATININE URINE RATIO
Creatinine, Urine: 65 mg/dL (ref 20–275)
Microalb Creat Ratio: 202 mcg/mg creat — ABNORMAL HIGH (ref <30)
Microalb, Ur: 13.1 mg/dL

## 2019-12-12 DIAGNOSIS — Z960 Presence of urogenital implants: Secondary | ICD-10-CM | POA: Diagnosis not present

## 2019-12-14 ENCOUNTER — Other Ambulatory Visit: Payer: Self-pay | Admitting: Internal Medicine

## 2020-01-03 ENCOUNTER — Telehealth: Payer: Self-pay

## 2020-01-03 NOTE — Telephone Encounter (Signed)
Patient dropped of handicap placard form to have Dr Ronnald Ramp or Adelina Mings fill out. Enclosed in the envelope was the check to mail with it for $10. States that once it's finished, we can mail it off for her. Placed in provider box

## 2020-01-04 NOTE — Telephone Encounter (Signed)
Pt spouse contacted and requested that we place in the mail for them. Envelope placed in the mail per request.

## 2020-01-19 ENCOUNTER — Other Ambulatory Visit: Payer: Self-pay | Admitting: Internal Medicine

## 2020-01-19 DIAGNOSIS — J4540 Moderate persistent asthma, uncomplicated: Secondary | ICD-10-CM

## 2020-01-26 ENCOUNTER — Other Ambulatory Visit: Payer: Self-pay | Admitting: Internal Medicine

## 2020-01-26 DIAGNOSIS — IMO0002 Reserved for concepts with insufficient information to code with codable children: Secondary | ICD-10-CM

## 2020-01-26 DIAGNOSIS — F419 Anxiety disorder, unspecified: Secondary | ICD-10-CM

## 2020-01-26 DIAGNOSIS — I10 Essential (primary) hypertension: Secondary | ICD-10-CM

## 2020-03-13 DIAGNOSIS — Z960 Presence of urogenital implants: Secondary | ICD-10-CM | POA: Diagnosis not present

## 2020-03-25 ENCOUNTER — Ambulatory Visit: Payer: Medicare Other | Admitting: Cardiology

## 2020-04-29 ENCOUNTER — Other Ambulatory Visit: Payer: Self-pay | Admitting: Internal Medicine

## 2020-04-29 DIAGNOSIS — F419 Anxiety disorder, unspecified: Secondary | ICD-10-CM

## 2020-05-11 ENCOUNTER — Ambulatory Visit: Payer: Medicare Other

## 2020-05-13 ENCOUNTER — Ambulatory Visit (HOSPITAL_COMMUNITY)
Admission: EM | Admit: 2020-05-13 | Discharge: 2020-05-13 | Disposition: A | Discharge status: Home / Self Care | Payer: Medicare Other

## 2020-05-13 ENCOUNTER — Telehealth: Payer: Self-pay | Admitting: Internal Medicine

## 2020-05-13 ENCOUNTER — Other Ambulatory Visit: Payer: Self-pay

## 2020-05-13 ENCOUNTER — Encounter (HOSPITAL_COMMUNITY): Payer: Self-pay | Admitting: Emergency Medicine

## 2020-05-13 DIAGNOSIS — R197 Diarrhea, unspecified: Secondary | ICD-10-CM | POA: Diagnosis not present

## 2020-05-13 NOTE — ED Provider Notes (Signed)
[facility]    CSN: [id number] Arrival date & time: 05/13/20  1610      History   Chief Complaint No chief complaint on file.   HPI Nichole Silva is a 85 y.o. female.   Pt brought in by daughter who assisted with history.  Pt with history of IBS, reports increased diarrhea lately.  Daughter reports pt's husband stated that she was confused earlier today.  Reports some confusion at baseline.   She denies fever, chills, abdominal pain, dysuria, blood in stool, congestion, cough, shortness of breath.  Daughter called primary care physician triage today and advised to follow up in UC/ED for further evaluation to evaluate for dehydration. Pt lives with husband and daughter.  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  She reports her IBS sx are worse when she is stressed.  Pt takes  Bentyl.  She reports she did talk to her husband earlier today in a way she usually does not which alarmed her husband, reports she was sticking up for herself.      Past Medical History:  Diagnosis Date  . Allergic rhinitis, cause unspecified   . Anxiety state, unspecified   . Asthma   . Coronary atherosclerosis of unspecified type of vessel, native or graft   . Depression   . Female bladder prolapse   . Hyperlipidemia   . IBS (irritable bowel syndrome)   . Osteoarthrosis, unspecified whether generalized or localized, unspecified site   . Type II or unspecified type diabetes mellitus without mention of complication, not stated as uncontrolled   . Unspecified chronic bronchitis ([place])   . Unspecified essential hypertension   . Unspecified hearing loss   . Unspecified venous (peripheral) insufficiency   . Unspecified vitamin D deficiency     Patient Active Problem List   Diagnosis Date Noted  . Dementia without behavioral disturbance ([place]) 11/17/2018  . Estrogen deficiency 03/08/2017  . Age-related osteoporosis with current  pathological fracture 03/08/2017  . Eczema 05/28/2016  . Asthma, moderate persistent 07/29/2015  . IBS (irritable bowel syndrome) 01/30/2015  . Pessary maintenance 10/25/2013  . Cystocele with uterine prolapse 10/25/2013  . Anxiety 10/06/2013  . Routine general medical examination at a health care facility 09/14/2013  . Vitamin D deficiency 02/25/2009  . DEGENERATIVE JOINT DISEASE 02/27/2008  . Type II diabetes mellitus with manifestations ([place]) 09/02/2007  . Hyperlipidemia with target LDL less than 100 07/05/2007  . Essential hypertension 07/05/2007  . Coronary atherosclerosis 07/05/2007    Past Surgical History:  Procedure Laterality Date  . Cataract surg  2009  . CHOLECYSTECTOMY  1997  . DILATION AND CURETTAGE OF UTERUS  1960's  . Left elbow surgery  1992    OB History    Gravida  6   Para  4   Term  3   Preterm  1   AB  2   Living  4     SAB  2   IAB      Ectopic      Multiple      Live Births  4            Home Medications    Prior to Admission medications   Medication Sig Start Date End Date Taking? Authorizing Provider  acetaminophen (TYLENOL) 325 MG tablet Take 325 mg by mouth every 6 (six) hours as needed for mild pain or moderate pain. Pain/headache    [provider]  ALPRAZolam (XANAX) 1 MG tablet TAKE 1 TABLET BY MOUTH THREE TIMES A DAY AS NEEDED FOR ANXIETY 04/29/20   Janith Lima, MD  atorvastatin (LIPITOR) 40 MG tablet TAKE 1 TABLET (40 MG TOTAL) BY MOUTH DAILY AT 6 PM. 10/24/19   Janith Lima, MD  BREO ELLIPTA 200-25 MCG/INH AEPB INHALE 1 PUFF BY MOUTH EVERY DAY 01/19/20   Janith Lima, MD  cetirizine (ZYRTEC) 10 MG tablet Take 10 mg by mouth daily.      [provider]  dicyclomine (BENTYL) 10 MG capsule TAKE ONE CAPSULE BY MOUTH 3 TIMES A DAY BEFORE MEALS Patient taking differently: Take 10 mg by mouth 3 (three) times daily before meals.  06/20/18   Janith Lima, MD  fluticasone (FLONASE) 50 MCG/ACT nasal  spray PLACE 2 SPRAYS INTO BOTH NOSTRILS AT BEDTIME 10/24/19   Janith Lima, MD  furosemide (LASIX) 40 MG tablet TAKE 1 TO 2 TABLETS (40-80 MG TOTAL) BY MOUTH DAILY. Patient taking differently: Take 40-80 mg by mouth daily.  07/13/18   Janith Lima, MD  losartan (COZAAR) 50 MG tablet TAKE 1 TABLET BY MOUTH EVERY DAY 01/26/20   Janith Lima, MD  PROAIR HFA 108 (90 Base) MCG/ACT inhaler INHALE 1 TO 2 PUFFS BY MOUTH EVERY 4 HOURS AS NEEDED FOR WHEEZE OR SHORTNESS OF BREATH Patient taking differently: Inhale 1-2 puffs into the lungs every 4 (four) hours as needed for wheezing or shortness of breath.  06/05/15   Janith Lima, MD  Vitamin D, Ergocalciferol, (DRISDOL) 1.25 MG (50000 UNIT) CAPS capsule TAKE 1 CAPSULE (50,000 UNITS TOTAL) BY MOUTH EVERY 7 (SEVEN) DAYS. 01/26/20   Janith Lima, MD  BREO ELLIPTA 200-25 MCG/INH AEPB INHALE 1 PUFF BY MOUTH EVERY DAY 09/20/19   Janith Lima, MD  Vitamin D, Ergocalciferol, (DRISDOL) 1.25 MG (50000 UT) CAPS capsule Take 1 capsule (50,000 Units total) by mouth every 7 (seven) days. 11/24/18   Janith Lima, MD    Family History Family History  Problem Relation Age of Onset  . Emphysema Mother   . Heart disease Father   . Colon cancer Sister   . Colon cancer Other   . Pancreatic cancer Other     Social History Social History   Tobacco Use  . Smoking status: Never Smoker  . Smokeless tobacco: Never Used  Vaping Use  . Vaping Use: Never used  Substance Use Topics  . Alcohol use: No    Alcohol/week: 0.0 standard drinks  . Drug use: No     Allergies   Nifedipine, Codeine, Metformin and related, and Penicillins   Review of Systems Review of Systems  Constitutional: Negative for chills and fever.  HENT: Negative for ear pain and sore throat.   Eyes: Negative for pain and visual disturbance.  Respiratory: Negative for cough and shortness of breath.   Cardiovascular: Negative for chest pain and palpitations.  Gastrointestinal: Positive  for diarrhea. Negative for abdominal pain, blood in stool, nausea and vomiting.  Genitourinary: Negative for dysuria and hematuria.  Musculoskeletal: Negative for arthralgias and back pain.  Skin: Negative for color change and rash.  Neurological: Negative for seizures and syncope.  All other systems reviewed and are negative.    Physical Exam Triage Vital Signs ED Triage Vitals  Enc Vitals Group     BP 05/13/20 1709 123/71     Pulse Rate 05/13/20 1709 84     Resp 05/13/20 1709 16     Temp  05/13/20 1709 (!) 97.4 F (36.3 C)     Temp Source 05/13/20 1709 Oral     SpO2 05/13/20 1709 98 %     Weight --      Height --      Head Circumference --      Peak Flow --      Pain Score 05/13/20 1706 0     Pain Loc --      Pain Edu? --      Excl. in [place]? --    No data found.  Updated Vital Signs BP 123/71 (BP Location: Left Arm)   Pulse 84   Temp (!) 97.4 F (36.3 C) (Oral)   Resp 16   SpO2 98%   Visual Acuity Right Eye Distance:   Left Eye Distance:   Bilateral Distance:    Right Eye Near:   Left Eye Near:    Bilateral Near:     Physical Exam Vitals and nursing note reviewed.  Constitutional:      General: She is not in acute distress.    Appearance: She is well-developed and well-nourished.  HENT:     Head: Normocephalic and atraumatic.  Eyes:     Conjunctiva/sclera: Conjunctivae normal.  Cardiovascular:     Rate and Rhythm: Normal rate and regular rhythm.     Heart sounds: No murmur heard.   Pulmonary:     Effort: Pulmonary effort is normal. No respiratory distress.     Breath sounds: Normal breath sounds.  Abdominal:     Palpations: Abdomen is soft.     Tenderness: There is no abdominal tenderness.  Musculoskeletal:        General: No edema.     Cervical back: Neck supple.  Skin:    General: Skin is warm and dry.  Neurological:     Mental Status: She is alert.  Psychiatric:        Mood and Affect: Mood and affect normal.      UC Treatments /  Results  Labs (all labs ordered are listed, but only abnormal results are displayed) Labs Reviewed - No data to display  EKG   Radiology No results found.  Procedures Procedures (including critical care time)  Medications Ordered in UC Medications - No data to display  Initial Impression / Assessment and Plan / UC Course  I have reviewed the triage vital signs and the nursing notes.  Pertinent labs & imaging results that were available during my care of the patient were reviewed by me and considered in my medical decision making (see chart for details).    Vitals WNL, benign exam.  Pt is alert and oriented.  She was unable to give urine sample today, she will bring it back tonight or in the morning.  She is tolerating fluids without difficulty in clinic today.  Pt well appearing with no signs of dehydration or mental status change, daughter agrees with this assessment.  Advised to follow up with PCP.  If she has status change before she is able to follow up with her PCP advised evaluation in the ED.  Final Clinical Impressions(s) / UC Diagnoses   Final diagnoses:  None   Discharge Instructions   None    ED Prescriptions    None     PDMP not reviewed this encounter.   Konrad Felix, PA-C 05/13/20 [id number]

## 2020-05-13 NOTE — Telephone Encounter (Signed)
Patients daughter Curt Bears calling stating her father is stating that the patient is experiencing some altered mental status, diarrhea, and "just doesn't seem right". Daughter also states patient hasn't been making much sense either recently. Transferred to team health for further evaluation.

## 2020-05-13 NOTE — Discharge Instructions (Signed)
Recommend follow up with Primary Care Physician   May bring urine sample in the morning  If symptoms worsen follow up in the Emergency Department

## 2020-05-13 NOTE — ED Triage Notes (Signed)
Concerns for IBS issues.  Patient has had diarrhea since Sunday.

## 2020-05-20 ENCOUNTER — Other Ambulatory Visit: Payer: Self-pay | Admitting: Internal Medicine

## 2020-05-20 DIAGNOSIS — E785 Hyperlipidemia, unspecified: Secondary | ICD-10-CM

## 2020-06-10 ENCOUNTER — Ambulatory Visit: Payer: Medicare Other | Admitting: Internal Medicine

## 2020-06-13 ENCOUNTER — Ambulatory Visit: Payer: Medicare Other | Admitting: Internal Medicine

## 2020-06-17 ENCOUNTER — Other Ambulatory Visit: Payer: Self-pay | Admitting: Internal Medicine

## 2020-06-21 ENCOUNTER — Ambulatory Visit: Payer: Medicare Other

## 2020-06-24 ENCOUNTER — Ambulatory Visit: Payer: Medicare Other

## 2020-07-01 ENCOUNTER — Other Ambulatory Visit: Payer: Self-pay | Admitting: Internal Medicine

## 2020-07-01 DIAGNOSIS — E1165 Type 2 diabetes mellitus with hyperglycemia: Secondary | ICD-10-CM

## 2020-07-01 DIAGNOSIS — IMO0002 Reserved for concepts with insufficient information to code with codable children: Secondary | ICD-10-CM

## 2020-07-01 DIAGNOSIS — I10 Essential (primary) hypertension: Secondary | ICD-10-CM

## 2020-07-11 DIAGNOSIS — Z1231 Encounter for screening mammogram for malignant neoplasm of breast: Secondary | ICD-10-CM | POA: Diagnosis not present

## 2020-07-11 DIAGNOSIS — Z960 Presence of urogenital implants: Secondary | ICD-10-CM | POA: Diagnosis not present

## 2020-07-22 ENCOUNTER — Ambulatory Visit (INDEPENDENT_AMBULATORY_CARE_PROVIDER_SITE_OTHER): Payer: Medicare Other | Admitting: Internal Medicine

## 2020-07-22 ENCOUNTER — Encounter: Payer: Self-pay | Admitting: Internal Medicine

## 2020-07-22 ENCOUNTER — Ambulatory Visit: Payer: Medicare Other | Admitting: Internal Medicine

## 2020-07-22 ENCOUNTER — Other Ambulatory Visit: Payer: Self-pay

## 2020-07-22 VITALS — BP 158/76 | HR 78 | Temp 98.7°F | Resp 16 | Ht 64.0 in | Wt 123.0 lb

## 2020-07-22 DIAGNOSIS — K589 Irritable bowel syndrome without diarrhea: Secondary | ICD-10-CM | POA: Diagnosis not present

## 2020-07-22 DIAGNOSIS — D539 Nutritional anemia, unspecified: Secondary | ICD-10-CM | POA: Diagnosis not present

## 2020-07-22 DIAGNOSIS — I10 Essential (primary) hypertension: Secondary | ICD-10-CM | POA: Diagnosis not present

## 2020-07-22 DIAGNOSIS — E118 Type 2 diabetes mellitus with unspecified complications: Secondary | ICD-10-CM

## 2020-07-22 MED ORDER — DICYCLOMINE HCL 10 MG PO CAPS: ORAL_CAPSULE | ORAL | 1 refills | Status: DC

## 2020-07-22 NOTE — Progress Notes (Signed)
Subjective:  Patient ID: Nichole Silva, female    DOB: 16-Feb-1935  Age: 86 y.o. MRN: [id number]  CC: Anemia, Hypertension, and Diabetes  This visit occurred during the SARS-CoV-2 public health emergency.  Safety protocols were in place, including screening questions prior to the visit, additional usage of staff PPE, and extensive cleaning of exam room while observing appropriate contact time as indicated for disinfecting solutions.    HPI Nichole Silva presents for f/up - She tells me her IBS symptoms have been well controlled with Bentyl.  She denies any recent episodes of abdominal pain, nausea, vomiting, loss of appetite, melena, bright red blood per rectum, diarrhea, or constipation.  Outpatient Medications Prior to Visit  Medication Sig Dispense Refill  . acetaminophen (TYLENOL) 325 MG tablet Take 325 mg by mouth every 6 (six) hours as needed for mild pain or moderate pain. Pain/headache    . ALPRAZolam (XANAX) 1 MG tablet TAKE 1 TABLET BY MOUTH THREE TIMES A DAY AS NEEDED FOR ANXIETY 90 tablet 5  . atorvastatin (LIPITOR) 40 MG tablet TAKE 1 TABLET (40 MG TOTAL) BY MOUTH DAILY AT 6 PM. 90 tablet 1  . BREO ELLIPTA 200-25 MCG/INH AEPB INHALE 1 PUFF BY MOUTH EVERY DAY 60 each 3  . cetirizine (ZYRTEC) 10 MG tablet Take 10 mg by mouth daily.    . fluticasone (FLONASE) 50 MCG/ACT nasal spray PLACE 2 SPRAYS INTO BOTH NOSTRILS AT BEDTIME 48 mL 1  . furosemide (LASIX) 40 MG tablet TAKE 1 TO 2 TABLETS (40-80 MG TOTAL) BY MOUTH DAILY. (Patient taking differently: Take 40-80 mg by mouth daily.) 170 tablet 0  . losartan (COZAAR) 50 MG tablet TAKE 1 TABLET BY MOUTH EVERY DAY 90 tablet 1  . PROAIR HFA 108 (90 Base) MCG/ACT inhaler INHALE 1 TO 2 PUFFS BY MOUTH EVERY 4 HOURS AS NEEDED FOR WHEEZE OR SHORTNESS OF BREATH (Patient taking differently: Inhale 1-2 puffs into the lungs every 4 (four) hours as needed for wheezing or shortness of breath.) 8.5 Inhaler 11  . Vitamin D, Ergocalciferol, (DRISDOL) 1.25  MG (50000 UNIT) CAPS capsule TAKE 1 CAPSULE BY MOUTH EVERY 7 DAYS 12 capsule 0  . dicyclomine (BENTYL) 10 MG capsule TAKE ONE CAPSULE BY MOUTH 3 TIMES A DAY BEFORE MEALS (Patient taking differently: Take 10 mg by mouth 3 (three) times daily before meals.) 270 capsule 2   No facility-administered medications prior to visit.    ROS Review of Systems  Constitutional: Negative.  Negative for appetite change, diaphoresis, fatigue and unexpected weight change.  HENT: Negative.   Eyes: Negative.   Respiratory: Negative for cough, chest tightness, shortness of breath and wheezing.   Cardiovascular: Negative for chest pain, palpitations and leg swelling.  Gastrointestinal: Negative for abdominal pain, constipation, diarrhea, nausea and vomiting.  Endocrine: Negative.   Genitourinary: Negative.  Negative for difficulty urinating.  Musculoskeletal: Negative for arthralgias, back pain and myalgias.  Skin: Negative.  Negative for color change and pallor.  Neurological: Negative.  Negative for dizziness and weakness.  Hematological: Negative for adenopathy. Does not bruise/bleed easily.  Psychiatric/Behavioral: Negative.     Objective:  BP (!) 158/76   Pulse 78   Temp 98.7 F (37.1 C) (Oral)   Resp 16   Ht 5\' 4"  (1.626 m)   Wt 123 lb (55.8 kg)   SpO2 95%   BMI 21.11 kg/m   BP Readings from Last 3 Encounters:  07/22/20 (!) 158/76  05/13/20 123/71  12/06/19 (!) 144/68    Wt Readings  from Last 3 Encounters:  07/22/20 123 lb (55.8 kg)  12/06/19 140 lb (63.5 kg)  08/08/19 133 lb 12.8 oz (60.7 kg)    Physical Exam Vitals reviewed.  Constitutional:      Appearance: She is not ill-appearing.  HENT:     Nose: Nose normal.     Mouth/Throat:     Mouth: Mucous membranes are moist.  Eyes:     General: No scleral icterus.    Conjunctiva/sclera: Conjunctivae normal.  Cardiovascular:     Rate and Rhythm: Normal rate and regular rhythm.     Heart sounds: No murmur heard.   Pulmonary:      Effort: Pulmonary effort is normal.     Breath sounds: No stridor. No wheezing, rhonchi or rales.  Abdominal:     General: Abdomen is protuberant. Bowel sounds are normal. There is no distension.     Palpations: Abdomen is soft. There is no hepatomegaly, splenomegaly or mass.     Tenderness: There is no abdominal tenderness.  Musculoskeletal:        General: Normal range of motion.     Cervical back: Neck supple.     Right lower leg: No edema.     Left lower leg: No edema.  Lymphadenopathy:     Cervical: No cervical adenopathy.  Skin:    General: Skin is warm and dry.     Coloration: Skin is not pale.  Neurological:     General: No focal deficit present.     Mental Status: She is alert.  Psychiatric:        Mood and Affect: Mood normal.        Behavior: Behavior normal.     Lab Results  Component Value Date   WBC 8.3 07/22/2020   HGB 11.9 (L) 07/22/2020   HCT 36.1 07/22/2020   PLT 272.0 07/22/2020   GLUCOSE 96 07/22/2020   CHOL 159 12/06/2019   TRIG 137 12/06/2019   HDL 57 12/06/2019   LDLDIRECT 102.0 07/29/2015   LDLCALC 78 12/06/2019   ALT 13 08/08/2019   AST 18 08/08/2019   NA 139 07/22/2020   K 4.7 07/22/2020   CL 102 07/22/2020   CREATININE 0.82 07/22/2020   BUN 16 07/22/2020   CO2 28 07/22/2020   TSH 1.29 07/22/2020   HGBA1C 5.7 07/22/2020   MICROALBUR 13.1 12/07/2019    No results found.  Assessment & Plan:   Nichole Silva was seen today for anemia, hypertension and diabetes.  Diagnoses and all orders for this visit:  Essential hypertension- Her blood pressure is adequately well controlled.  Electrolytes and renal function are normal. -     CBC with Differential/Platelet; Future -     Basic metabolic panel; Future -     TSH; Future -     TSH -     Basic metabolic panel -     CBC with Differential/Platelet  IBS (irritable bowel syndrome)- She is doing well on the current dose of dicyclomine.  Will continue. -     dicyclomine (BENTYL) 10 MG  capsule; TAKE ONE CAPSULE BY MOUTH 3 TIMES A DAY BEFORE MEALS  Type II diabetes mellitus with manifestations ([place])- Her blood sugar is adequately well controlled. -     Basic metabolic panel; Future -     Hemoglobin A1c; Future -     Hemoglobin A1c -     Basic metabolic panel  Deficiency anemia- I have asked her to return to be screened for vitamin deficiencies.  I am having Nichole Silva maintain her cetirizine, acetaminophen, ProAir HFA, furosemide, fluticasone, Breo Ellipta, ALPRAZolam, atorvastatin, Vitamin D (Ergocalciferol), losartan, and dicyclomine.  Meds ordered this encounter  Medications  . dicyclomine (BENTYL) 10 MG capsule    Sig: TAKE ONE CAPSULE BY MOUTH 3 TIMES A DAY BEFORE MEALS    Dispense:  270 capsule    Refill:  1     Follow-up: Return in about 6 months (around 01/19/2021).  Scarlette Calico, MD

## 2020-07-22 NOTE — Patient Instructions (Signed)

## 2020-07-23 DIAGNOSIS — D539 Nutritional anemia, unspecified: Secondary | ICD-10-CM | POA: Insufficient documentation

## 2020-07-23 LAB — CBC WITH DIFFERENTIAL/PLATELET
Basophils Absolute: 0.1 10*3/uL (ref 0.0–0.1)
Basophils Relative: 1.1 % (ref 0.0–3.0)
Eosinophils Absolute: 0.1 10*3/uL (ref 0.0–0.7)
Eosinophils Relative: 1.2 % (ref 0.0–5.0)
HCT: 36.1 % (ref 36.0–46.0)
Hemoglobin: 11.9 g/dL — ABNORMAL LOW (ref 12.0–15.0)
Lymphocytes Relative: 28.1 % (ref 12.0–46.0)
Lymphs Abs: 2.3 10*3/uL (ref 0.7–4.0)
MCHC: 33 g/dL (ref 30.0–36.0)
MCV: 93.4 fl (ref 78.0–100.0)
Monocytes Absolute: 0.7 10*3/uL (ref 0.1–1.0)
Monocytes Relative: 8.4 % (ref 3.0–12.0)
Neutro Abs: 5.1 10*3/uL (ref 1.4–7.7)
Neutrophils Relative %: 61.2 % (ref 43.0–77.0)
Platelets: 272 10*3/uL (ref 150.0–400.0)
RBC: 3.86 Mil/uL — ABNORMAL LOW (ref 3.87–5.11)
RDW: 13.9 % (ref 11.5–15.5)
WBC: 8.3 10*3/uL (ref 4.0–10.5)

## 2020-07-23 LAB — BASIC METABOLIC PANEL
BUN: 16 mg/dL (ref 6–23)
CO2: 28 mEq/L (ref 19–32)
Calcium: 10 mg/dL (ref 8.4–10.5)
Chloride: 102 mEq/L (ref 96–112)
Creatinine, Ser: 0.82 mg/dL (ref 0.40–1.20)
GFR: 66.06 mL/min (ref >60.00)
Glucose, Bld: 96 mg/dL (ref 70–99)
Potassium: 4.7 mEq/L (ref 3.5–5.1)
Sodium: 139 mEq/L (ref 135–145)

## 2020-07-23 LAB — TSH: TSH: 1.29 u[IU]/mL (ref 0.35–4.50)

## 2020-07-23 LAB — HEMOGLOBIN A1C: Hgb A1c MFr Bld: 5.7 % (ref 4.6–6.5)

## 2020-09-27 ENCOUNTER — Ambulatory Visit: Payer: Medicare Other | Admitting: Family Medicine

## 2020-09-27 ENCOUNTER — Ambulatory Visit (INDEPENDENT_AMBULATORY_CARE_PROVIDER_SITE_OTHER): Payer: Medicare Other

## 2020-09-27 ENCOUNTER — Other Ambulatory Visit: Payer: Self-pay

## 2020-09-27 ENCOUNTER — Encounter: Payer: Self-pay | Admitting: Family Medicine

## 2020-09-27 ENCOUNTER — Ambulatory Visit: Payer: Self-pay

## 2020-09-27 VITALS — BP 130/80 | HR 69 | Ht 64.0 in | Wt 123.0 lb

## 2020-09-27 DIAGNOSIS — M25561 Pain in right knee: Secondary | ICD-10-CM

## 2020-09-27 IMAGING — DX DG KNEE AP/LAT W/ SUNRISE*R*
3 series · 3 of 3 positions shown · non-contrast
Comparison: None.

CLINICAL DATA: Chronic right knee pain and increases with flexion.
No injury.

EXAM:
RIGHT KNEE 3 VIEWS

[knee ap]
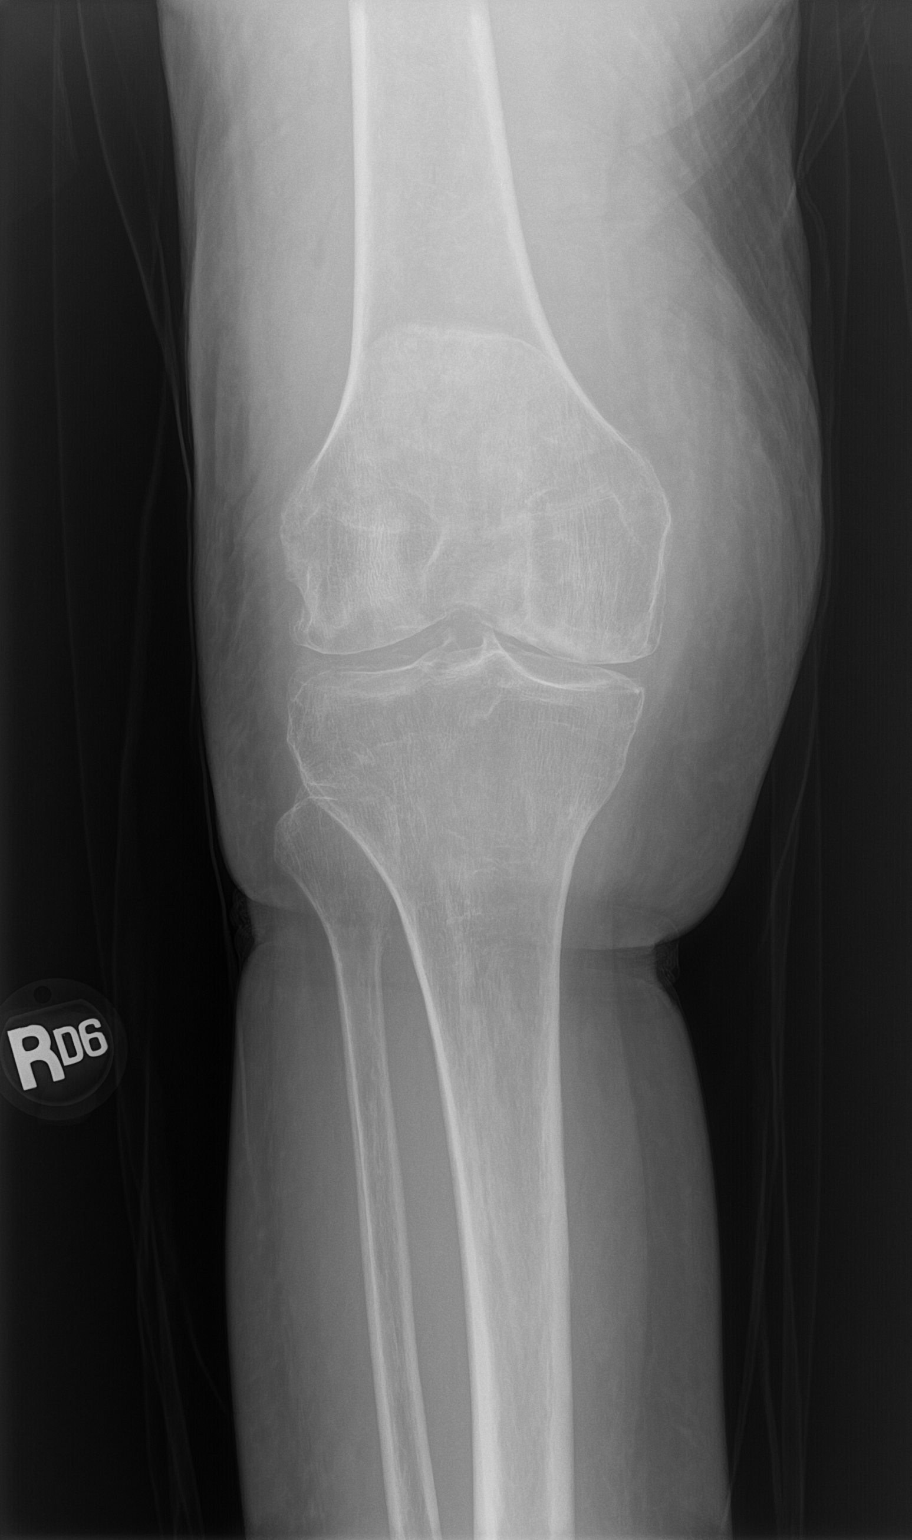

[knee lat]
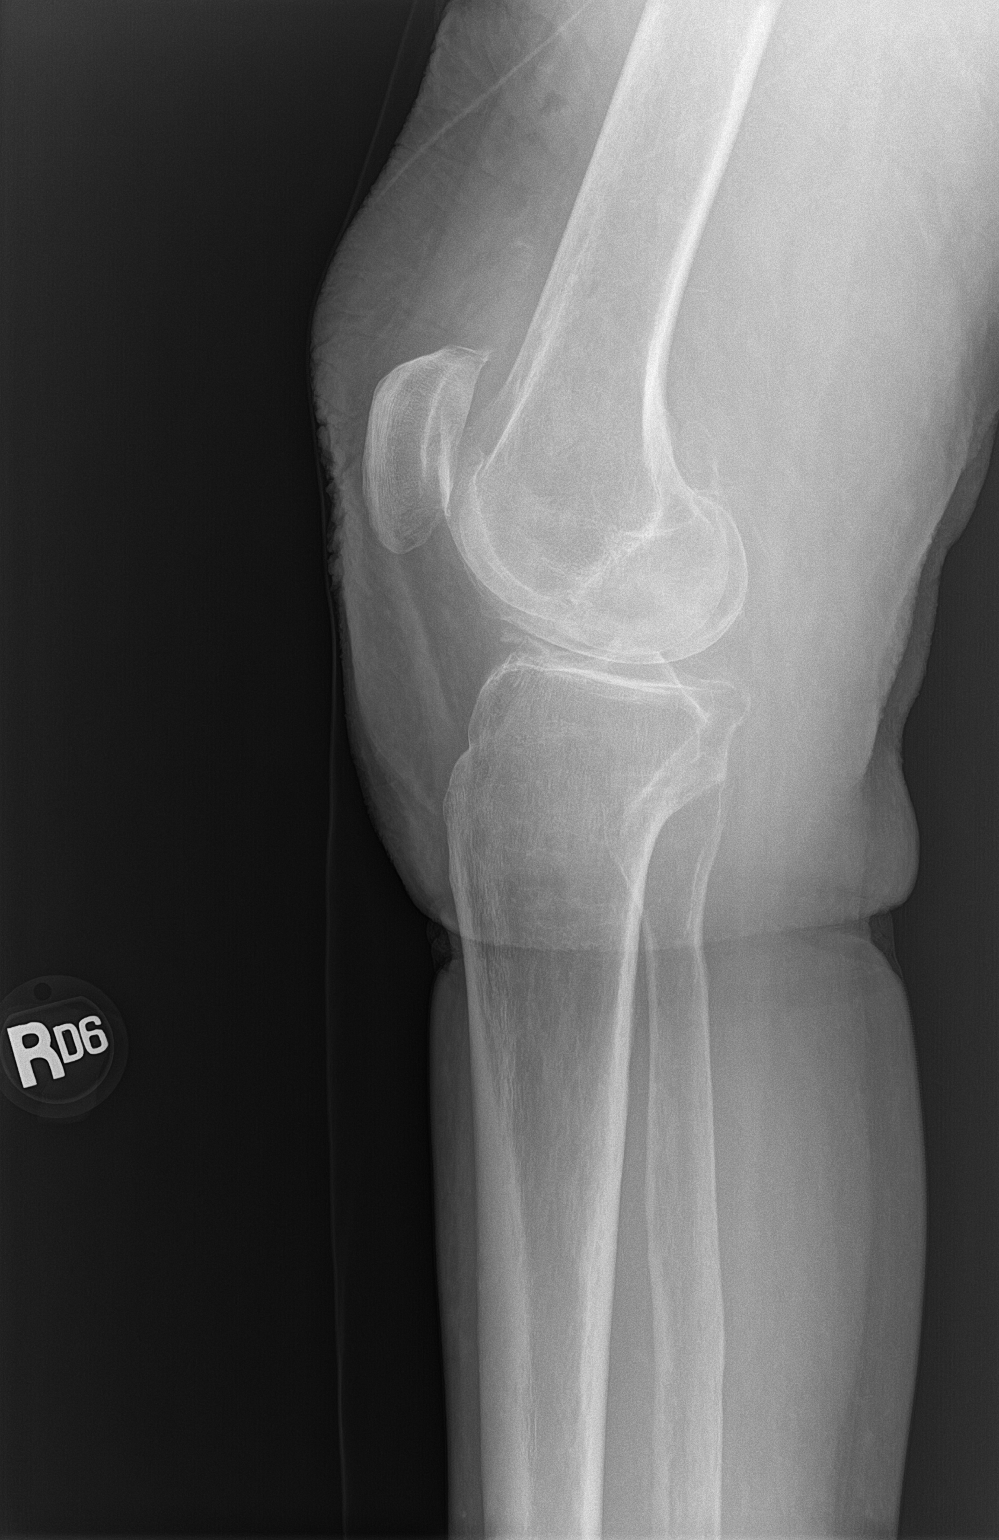

[patella]
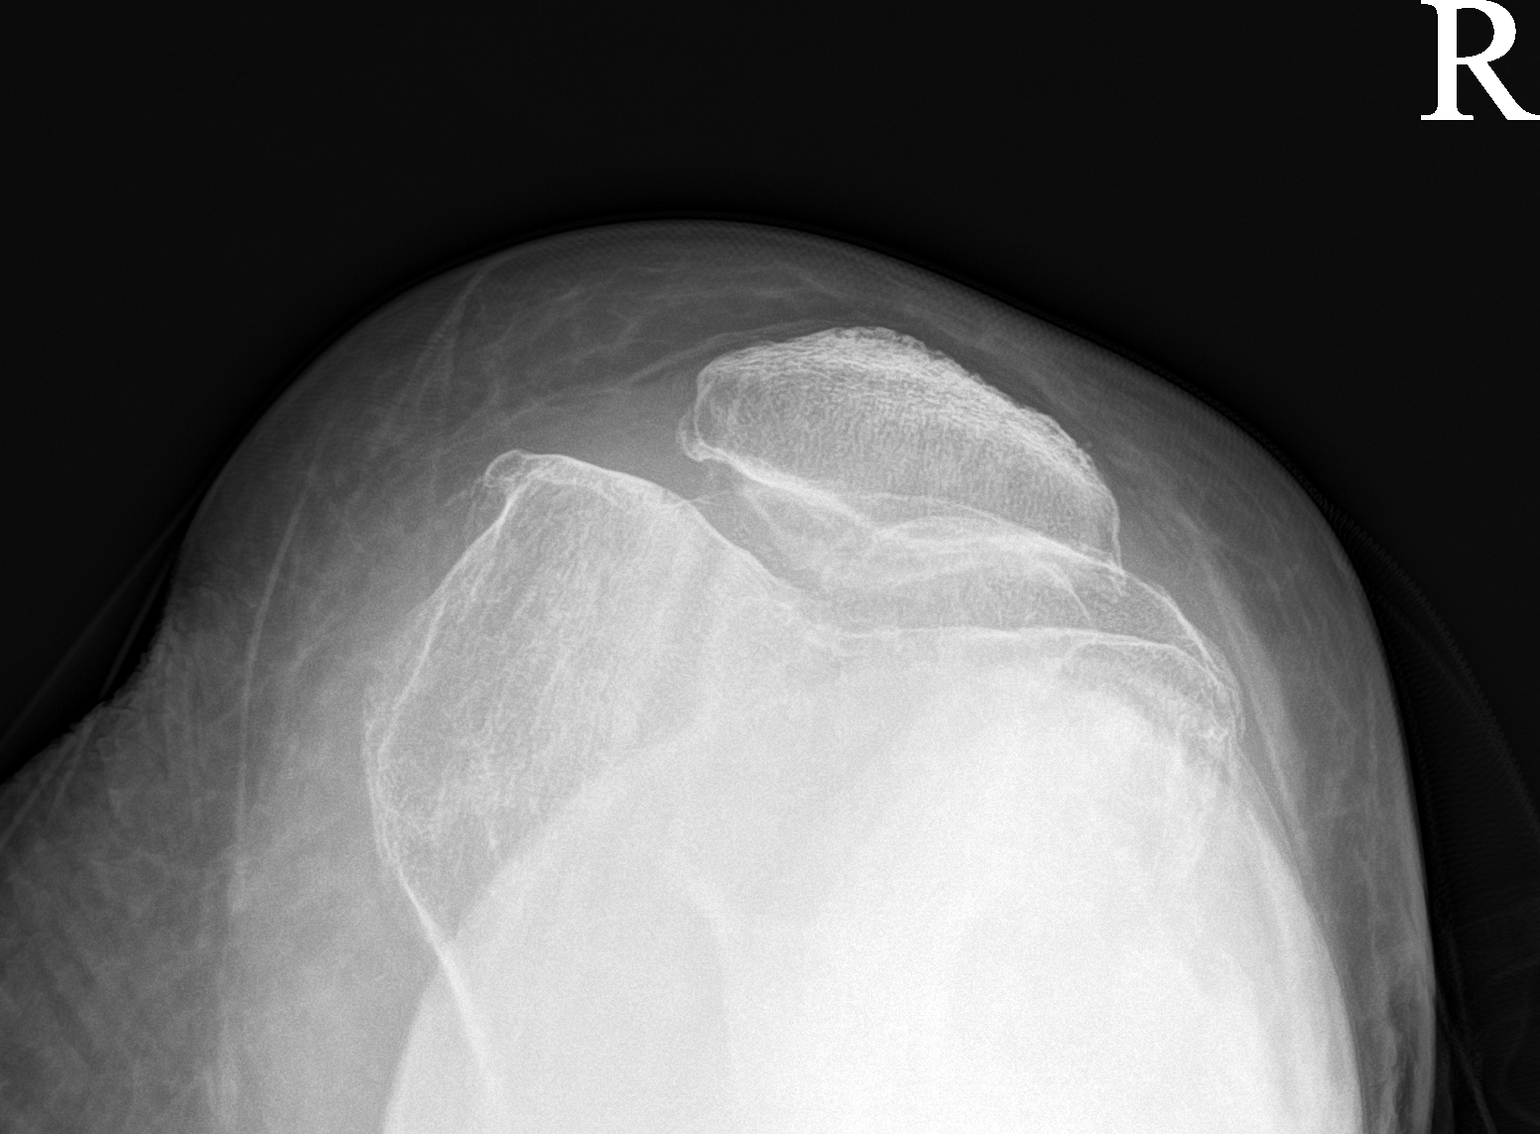

[3 of 3 positions shown; findings below may reference images not displayed]

FINDINGS: Chondrocalcinosis in the medial and lateral compartments.
Osteophytosis in the lateral and patellofemoral compartments.
Subchondral sclerosis along the medial femoral condyle. No joint
effusion.
IMPRESSION: Mild-to-moderate tricompartment osteoarthritis.

## 2020-09-27 NOTE — Patient Instructions (Addendum)
Nice to meet you today.  You had a R knee injection today.  Call or go to the ER if you develop a large red swollen joint with extreme pain or oozing puss.   Please get an Xray today before you leave.  Please use Voltaren gel (Generic Diclofenac Gel) up to 4x daily for pain as needed.  This is available over-the-counter as both the name brand Voltaren gel and the generic diclofenac gel.  I'll see you back in 6 weeks.

## 2020-09-27 NOTE — Progress Notes (Signed)
Subjective:    CC: R knee pain  I, Molly Weber, LAT, ATC, am serving as scribe for Dr. [name].  HPI: Pt is an 86 y/o female c/o R knee pain x approximately 2 weeks. Pt locates pain to her R ant knee.  She notes the knee is swollen and therefore difficult to bend and is interfering with normal gait.  R Knee swelling: yes and into her B lower legs R knee mechanical symptoms: Yes Aggravates: transitioning from sit-to-stand; standing; walking Treatments tried: Tylenol; ice  Dx imaging: 07/13/11 L knee XR  Pertinent review of Systems: No fevers or chills  Relevant historical information: Hypertension.  Hard of hearing.   Objective:    Vitals:   09/27/20 1115  BP: 130/80  Pulse: 69  SpO2: 96%   General: Well Developed, well nourished, and in no acute distress.   MSK: Right knee large effusion otherwise normal. Normal extension limited flexion. Tender palpation medial joint line. Stable ligamentous exam.  Lab and Radiology Results  X-ray images right knee obtained today personally and independently interpreted Moderate DJD.  Chondrocalcinosis changes present.  No acute fractures Await formal radiology review  Procedure: Real-time Ultrasound Guided aspiration and injection of right knee superior lateral patellar space Device: Philips Affiniti 50G Images permanently stored and available for review in PACS Ultrasound evaluation prior to injection reveals large joint effusion with intact extensor structures. Verbal informed consent obtained.  Discussed risks and benefits of procedure. Warned about infection bleeding damage to structures skin hypopigmentation and fat atrophy among others. Patient expresses understanding and agreement Time-out conducted.   Noted no overlying erythema, induration, or other signs of local infection.   Skin prepped in a sterile fashion.   Local anesthesia: Topical Ethyl chloride.   With sterile technique and under real time  ultrasound guidance:  3 mL of lidocaine injected subcutaneously and into the knee joint.  The skin was again sterilized and an 18-gauge needle was used to access the joint capsule. 60 mL of clear yellow fluid was aspirated. Syringe was exchanged and 40 mg of Kenalog and 2 mL of Marcaine were injected into the joint. Fluid seen entering the joint capsule.  Joint capsule seen to be decompressed following the aspiration and injection  Completed without difficulty   Pain immediately resolved suggesting accurate placement of the medication.   Advised to call if fevers/chills, erythema, induration, drainage, or persistent bleeding.   Images permanently stored and available for review in the ultrasound unit.  Impression: Technically successful ultrasound guided injection.     Impression and Recommendations:    Assessment and Plan: 86 y.o. female with knee pain and effusion.  Patient does have degenerative changes but does have chondrocalcinosis on x-ray.  Patient should have good response to steroid injection however if needed may consider treatment for pseudogout.  Colchicine would be a reasonable choice but there is an interaction with colchicine and atorvastatin.  Recheck back in 6 weeks.  Return sooner if needed.  Recommend also Voltaren gel.  PDMP not reviewed this encounter. Orders Placed This Encounter  Procedures  . [place] LIMITED JOINT SPACE STRUCTURES LOW RIGHT(NO LINKED CHARGES)    Order Specific Question:   Reason for Exam (SYMPTOM  OR DIAGNOSIS REQUIRED)    Answer:   R knee pain    Order Specific Question:   Preferred imaging location?    Answer:   [facility]  . DG Knee AP/LAT W/Sunrise Right    Standing Status:   Future  Number of Occurrences:   1    Standing Expiration Date:   10/28/2020    Order Specific Question:   Reason for Exam (SYMPTOM  OR DIAGNOSIS REQUIRED)    Answer:   R knee pain    Order Specific Question:   Preferred imaging location?     Answer:   [facility]   No orders of the defined types were placed in this encounter.   Discussed warning signs or symptoms. Please see discharge instructions. Patient expresses understanding.   The above documentation has been reviewed and is accurate and complete [name], M.D.

## 2020-09-28 ENCOUNTER — Other Ambulatory Visit: Payer: Self-pay | Admitting: Internal Medicine

## 2020-09-28 DIAGNOSIS — E785 Hyperlipidemia, unspecified: Secondary | ICD-10-CM

## 2020-09-30 NOTE — Progress Notes (Signed)
Right knee x-ray shows medium arthritis

## 2020-10-17 DIAGNOSIS — Z960 Presence of urogenital implants: Secondary | ICD-10-CM | POA: Diagnosis not present

## 2020-10-29 ENCOUNTER — Other Ambulatory Visit: Payer: Self-pay | Admitting: Internal Medicine

## 2020-10-29 DIAGNOSIS — F419 Anxiety disorder, unspecified: Secondary | ICD-10-CM

## 2020-11-07 NOTE — Progress Notes (Signed)
I, Nichole Silva, LAT, ATC acting as a scribe for Nichole Leader, MD.  Nichole Silva is a 86 y.o. female who presents to [place] at [facility] today for f/u R knee pain ongoing since approximately the end of April. Pt was last seen by Dr. Georgina Snell on 09/27/20 and was given a R knee steroid injection and advised to use Voltaren gel. Today, pt reports R knee looks a bit swollen. Pt c/o bilat legs feel unstable, like they may give out on her. Pt also c/o numbness in bilat feet.  She notes occasional falls and difficulty with gait.  She has difficulty with transportation and does not drive.  She is interested in home health physical therapy to improve gait and fall prevention.  Dx imaging: 09/27/20 R knee XR  Pertinent review of systems: No fevers or chills  Relevant historical information: Diabetes.  Hypertension.  Hard of hearing.   Exam:  Ht 5\' 4"  (1.626 m)   Wt 130 lb 3.2 oz (59.1 kg)   BMI 22.35 kg/m  General: Well Developed, well nourished, and in no acute distress.   MSK: Right knee mild effusion normal motion with crepitation. L-spine nontender. Right foot and ankle no skin change.  Pulses capillary refill and sensation are intact distally.  Normal strength.  Motion intact. Negative slump test right lower extremity. Slight unstable gait.   Lab and Radiology Results  EXAM: LUMBAR SPINE - 2-3 VIEW   COMPARISON:  CT 05/27/2016   FINDINGS: Diffuse osseous demineralization. Mild superior endplate compression deformity of T11 with approximately 25% vertebral body height loss, new from 05/27/2016. Mild superior endplate irregularity of L1 is unchanged from prior. Chronic grade 1 anterolisthesis L5 on S1. Intervertebral disc spaces are relatively preserved. Lower lumbar facet arthrosis. Prominent abdominal aortic atherosclerotic calcification.   IMPRESSION: 1. Mild superior endplate compression deformity of T11 with approximately 25% vertebral body height loss,  new from 05/27/2016. MRI could be performed to further assess the acuity of this finding, as clinically indicated. 2. Chronic grade 1 anterolisthesis L5 on S1. 3. Diffuse osseous demineralization.   These results will be called to the ordering clinician or representative by the Radiologist Assistant, and communication documented in the PACS or [employer].     Electronically Signed   By: Davina Poke D.O.   On: 08/08/2019 16:58  EXAM: RIGHT KNEE 3 VIEWS   COMPARISON:  None.   FINDINGS: Chondrocalcinosis in the medial and lateral compartments. Osteophytosis in the lateral and patellofemoral compartments. Subchondral sclerosis along the medial femoral condyle. No joint effusion.   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     Electronically Signed   By: Lorin Picket M.D.   On: 09/30/2020 09:33    I, Nichole Silva, personally (independently) visualized and performed the interpretation of the images attached in this note.      Assessment and Plan: 86 y.o. female with frequent falls with right foot intermittent numbness.  Intermittent numbness may be due to lumbar nerve root compression with sitting or peripheral nerve compression was sitting or maybe even peripheral neuropathy.  The numbness is intermittent and not occurring today during exam testing.  Regardless she has an unstable gait and frequent falls.  She would benefit significantly from physical therapy intervention for fall prevention and gait training.  As she does not drive she is a good candidate for home health physical therapy.  Referral placed today.  Right knee pain due to DJD.  Did well with steroid injection at last visit.  Still has a bit of residual symptoms but overall much improved.  Watchful waiting.  Recheck in 6 weeks.   PDMP not reviewed this encounter. Orders Placed This Encounter  Procedures   Ambulatory referral to Home Health    Referral Priority:   Routine     Referral Type:   Home Health Care    Referral Reason:   Specialty Services Required    Requested Specialty:   [facility]    Number of Visits Requested:   1   No orders of the defined types were placed in this encounter.    Discussed warning signs or symptoms. Please see discharge instructions. Patient expresses understanding.   The above documentation has been reviewed and is accurate and complete Nichole Silva, M.D.

## 2020-11-08 ENCOUNTER — Ambulatory Visit: Payer: Medicare Other | Admitting: Family Medicine

## 2020-11-08 ENCOUNTER — Other Ambulatory Visit: Payer: Self-pay

## 2020-11-08 VITALS — Ht 64.0 in | Wt 130.2 lb

## 2020-11-08 DIAGNOSIS — R296 Repeated falls: Secondary | ICD-10-CM

## 2020-11-08 DIAGNOSIS — M25561 Pain in right knee: Secondary | ICD-10-CM | POA: Diagnosis not present

## 2020-11-08 DIAGNOSIS — R200 Anesthesia of skin: Secondary | ICD-10-CM

## 2020-11-08 NOTE — Patient Instructions (Signed)
Thank you for coming in today.   Lets do home health physical therapy for fall prevention.   Recheck in 6 weeks.

## 2020-11-11 DIAGNOSIS — E118 Type 2 diabetes mellitus with unspecified complications: Secondary | ICD-10-CM | POA: Diagnosis not present

## 2020-11-11 DIAGNOSIS — J4540 Moderate persistent asthma, uncomplicated: Secondary | ICD-10-CM | POA: Diagnosis not present

## 2020-11-11 DIAGNOSIS — I2510 Atherosclerotic heart disease of native coronary artery without angina pectoris: Secondary | ICD-10-CM | POA: Diagnosis not present

## 2020-11-11 DIAGNOSIS — M1711 Unilateral primary osteoarthritis, right knee: Secondary | ICD-10-CM | POA: Diagnosis not present

## 2020-11-11 DIAGNOSIS — I10 Essential (primary) hypertension: Secondary | ICD-10-CM | POA: Diagnosis not present

## 2020-11-12 DIAGNOSIS — Z960 Presence of urogenital implants: Secondary | ICD-10-CM | POA: Diagnosis not present

## 2020-11-15 DIAGNOSIS — E118 Type 2 diabetes mellitus with unspecified complications: Secondary | ICD-10-CM | POA: Diagnosis not present

## 2020-11-15 DIAGNOSIS — I10 Essential (primary) hypertension: Secondary | ICD-10-CM | POA: Diagnosis not present

## 2020-11-15 DIAGNOSIS — J4540 Moderate persistent asthma, uncomplicated: Secondary | ICD-10-CM | POA: Diagnosis not present

## 2020-11-15 DIAGNOSIS — I2510 Atherosclerotic heart disease of native coronary artery without angina pectoris: Secondary | ICD-10-CM | POA: Diagnosis not present

## 2020-11-15 DIAGNOSIS — M1711 Unilateral primary osteoarthritis, right knee: Secondary | ICD-10-CM | POA: Diagnosis not present

## 2020-11-18 DIAGNOSIS — J4540 Moderate persistent asthma, uncomplicated: Secondary | ICD-10-CM | POA: Diagnosis not present

## 2020-11-18 DIAGNOSIS — M1711 Unilateral primary osteoarthritis, right knee: Secondary | ICD-10-CM | POA: Diagnosis not present

## 2020-11-18 DIAGNOSIS — I10 Essential (primary) hypertension: Secondary | ICD-10-CM | POA: Diagnosis not present

## 2020-11-18 DIAGNOSIS — E118 Type 2 diabetes mellitus with unspecified complications: Secondary | ICD-10-CM | POA: Diagnosis not present

## 2020-11-18 DIAGNOSIS — I2510 Atherosclerotic heart disease of native coronary artery without angina pectoris: Secondary | ICD-10-CM | POA: Diagnosis not present

## 2020-11-22 DIAGNOSIS — J4540 Moderate persistent asthma, uncomplicated: Secondary | ICD-10-CM | POA: Diagnosis not present

## 2020-11-22 DIAGNOSIS — E118 Type 2 diabetes mellitus with unspecified complications: Secondary | ICD-10-CM | POA: Diagnosis not present

## 2020-11-22 DIAGNOSIS — I2510 Atherosclerotic heart disease of native coronary artery without angina pectoris: Secondary | ICD-10-CM | POA: Diagnosis not present

## 2020-11-22 DIAGNOSIS — I10 Essential (primary) hypertension: Secondary | ICD-10-CM | POA: Diagnosis not present

## 2020-11-22 DIAGNOSIS — M1711 Unilateral primary osteoarthritis, right knee: Secondary | ICD-10-CM | POA: Diagnosis not present

## 2020-11-28 DIAGNOSIS — E118 Type 2 diabetes mellitus with unspecified complications: Secondary | ICD-10-CM | POA: Diagnosis not present

## 2020-11-28 DIAGNOSIS — M1711 Unilateral primary osteoarthritis, right knee: Secondary | ICD-10-CM | POA: Diagnosis not present

## 2020-11-28 DIAGNOSIS — I10 Essential (primary) hypertension: Secondary | ICD-10-CM | POA: Diagnosis not present

## 2020-11-28 DIAGNOSIS — J4540 Moderate persistent asthma, uncomplicated: Secondary | ICD-10-CM | POA: Diagnosis not present

## 2020-11-28 DIAGNOSIS — I2510 Atherosclerotic heart disease of native coronary artery without angina pectoris: Secondary | ICD-10-CM | POA: Diagnosis not present

## 2020-12-03 DIAGNOSIS — I2510 Atherosclerotic heart disease of native coronary artery without angina pectoris: Secondary | ICD-10-CM | POA: Diagnosis not present

## 2020-12-03 DIAGNOSIS — J4540 Moderate persistent asthma, uncomplicated: Secondary | ICD-10-CM | POA: Diagnosis not present

## 2020-12-03 DIAGNOSIS — M1711 Unilateral primary osteoarthritis, right knee: Secondary | ICD-10-CM | POA: Diagnosis not present

## 2020-12-03 DIAGNOSIS — E118 Type 2 diabetes mellitus with unspecified complications: Secondary | ICD-10-CM | POA: Diagnosis not present

## 2020-12-03 DIAGNOSIS — I10 Essential (primary) hypertension: Secondary | ICD-10-CM | POA: Diagnosis not present

## 2020-12-11 NOTE — Progress Notes (Deleted)
   I, Peterson Lombard, LAT, ATC acting as a scribe for [name], MD.  Nichole Silva is a 86 y.o. female who presents to [place] at [facility] today for R knee pain, frequent falls, and intermittent R foot numbness. Of note, pt has difficulty with transportation and does not drive. Pt was last seen by Dr. Georgina Snell on 11/08/20 and was referred to [facility] PT. Today, pt reports  Dx imaging: 09/27/20 R knee XR  09/05/18 R foot CT  Pertinent review of systems: ***  Relevant historical information: ***   Exam:  There were no vitals taken for this visit. General: Well Developed, well nourished, and in no acute distress.   MSK: ***    Lab and Radiology Results No results found for this or any previous visit (from the past 72 hour(s)). No results found.     Assessment and Plan: 86 y.o. female with ***   PDMP not reviewed this encounter. No orders of the defined types were placed in this encounter.  No orders of the defined types were placed in this encounter.    Discussed warning signs or symptoms. Please see discharge instructions. Patient expresses understanding.   ***

## 2020-12-12 ENCOUNTER — Ambulatory Visit: Payer: Medicare Other | Admitting: Family Medicine

## 2020-12-13 DIAGNOSIS — I10 Essential (primary) hypertension: Secondary | ICD-10-CM | POA: Diagnosis not present

## 2020-12-13 DIAGNOSIS — J4540 Moderate persistent asthma, uncomplicated: Secondary | ICD-10-CM | POA: Diagnosis not present

## 2020-12-13 DIAGNOSIS — M1711 Unilateral primary osteoarthritis, right knee: Secondary | ICD-10-CM | POA: Diagnosis not present

## 2020-12-13 DIAGNOSIS — E118 Type 2 diabetes mellitus with unspecified complications: Secondary | ICD-10-CM | POA: Diagnosis not present

## 2020-12-13 DIAGNOSIS — I2510 Atherosclerotic heart disease of native coronary artery without angina pectoris: Secondary | ICD-10-CM | POA: Diagnosis not present

## 2020-12-18 DIAGNOSIS — I2510 Atherosclerotic heart disease of native coronary artery without angina pectoris: Secondary | ICD-10-CM | POA: Diagnosis not present

## 2020-12-18 DIAGNOSIS — E118 Type 2 diabetes mellitus with unspecified complications: Secondary | ICD-10-CM | POA: Diagnosis not present

## 2020-12-18 DIAGNOSIS — M1711 Unilateral primary osteoarthritis, right knee: Secondary | ICD-10-CM | POA: Diagnosis not present

## 2020-12-18 DIAGNOSIS — J4540 Moderate persistent asthma, uncomplicated: Secondary | ICD-10-CM | POA: Diagnosis not present

## 2020-12-18 DIAGNOSIS — I10 Essential (primary) hypertension: Secondary | ICD-10-CM | POA: Diagnosis not present

## 2020-12-30 ENCOUNTER — Other Ambulatory Visit: Payer: Self-pay | Admitting: Internal Medicine

## 2020-12-30 DIAGNOSIS — IMO0002 Reserved for concepts with insufficient information to code with codable children: Secondary | ICD-10-CM

## 2020-12-30 DIAGNOSIS — E1165 Type 2 diabetes mellitus with hyperglycemia: Secondary | ICD-10-CM

## 2020-12-30 DIAGNOSIS — I10 Essential (primary) hypertension: Secondary | ICD-10-CM

## 2021-01-08 ENCOUNTER — Encounter (HOSPITAL_COMMUNITY): Payer: Self-pay | Admitting: Emergency Medicine

## 2021-01-08 ENCOUNTER — Inpatient Hospital Stay (HOSPITAL_COMMUNITY)
Admission: EM | Admit: 2021-01-08 | Discharge: 2021-01-12 | DRG: 689 | Disposition: A | Discharge status: Home Health | Payer: Medicare Other | Attending: Internal Medicine | Admitting: Internal Medicine

## 2021-01-08 ENCOUNTER — Other Ambulatory Visit: Payer: Self-pay

## 2021-01-08 DIAGNOSIS — J449 Chronic obstructive pulmonary disease, unspecified: Secondary | ICD-10-CM | POA: Diagnosis present

## 2021-01-08 DIAGNOSIS — E440 Moderate protein-calorie malnutrition: Secondary | ICD-10-CM | POA: Diagnosis not present

## 2021-01-08 DIAGNOSIS — I6529 Occlusion and stenosis of unspecified carotid artery: Secondary | ICD-10-CM | POA: Diagnosis not present

## 2021-01-08 DIAGNOSIS — Z7189 Other specified counseling: Secondary | ICD-10-CM | POA: Diagnosis not present

## 2021-01-08 DIAGNOSIS — G9341 Metabolic encephalopathy: Secondary | ICD-10-CM | POA: Diagnosis not present

## 2021-01-08 DIAGNOSIS — Z515 Encounter for palliative care: Secondary | ICD-10-CM | POA: Diagnosis not present

## 2021-01-08 DIAGNOSIS — Z719 Counseling, unspecified: Secondary | ICD-10-CM | POA: Diagnosis not present

## 2021-01-08 DIAGNOSIS — I2510 Atherosclerotic heart disease of native coronary artery without angina pectoris: Secondary | ICD-10-CM | POA: Diagnosis not present

## 2021-01-08 DIAGNOSIS — N390 Urinary tract infection, site not specified: Secondary | ICD-10-CM | POA: Diagnosis not present

## 2021-01-08 DIAGNOSIS — E559 Vitamin D deficiency, unspecified: Secondary | ICD-10-CM | POA: Diagnosis present

## 2021-01-08 DIAGNOSIS — R54 Age-related physical debility: Secondary | ICD-10-CM | POA: Diagnosis present

## 2021-01-08 DIAGNOSIS — F419 Anxiety disorder, unspecified: Secondary | ICD-10-CM | POA: Diagnosis present

## 2021-01-08 DIAGNOSIS — R4182 Altered mental status, unspecified: Secondary | ICD-10-CM | POA: Diagnosis present

## 2021-01-08 DIAGNOSIS — Z825 Family history of asthma and other chronic lower respiratory diseases: Secondary | ICD-10-CM | POA: Diagnosis not present

## 2021-01-08 DIAGNOSIS — Z800 Family history of malignant neoplasm of digestive organs: Secondary | ICD-10-CM | POA: Diagnosis not present

## 2021-01-08 DIAGNOSIS — E860 Dehydration: Secondary | ICD-10-CM | POA: Diagnosis not present

## 2021-01-08 DIAGNOSIS — N8110 Cystocele, unspecified: Secondary | ICD-10-CM | POA: Diagnosis not present

## 2021-01-08 DIAGNOSIS — R627 Adult failure to thrive: Secondary | ICD-10-CM | POA: Diagnosis not present

## 2021-01-08 DIAGNOSIS — J45909 Unspecified asthma, uncomplicated: Secondary | ICD-10-CM | POA: Diagnosis not present

## 2021-01-08 DIAGNOSIS — Z20822 Contact with and (suspected) exposure to covid-19: Secondary | ICD-10-CM | POA: Diagnosis not present

## 2021-01-08 DIAGNOSIS — Z043 Encounter for examination and observation following other accident: Secondary | ICD-10-CM | POA: Diagnosis not present

## 2021-01-08 DIAGNOSIS — Z8249 Family history of ischemic heart disease and other diseases of the circulatory system: Secondary | ICD-10-CM

## 2021-01-08 DIAGNOSIS — E785 Hyperlipidemia, unspecified: Secondary | ICD-10-CM | POA: Diagnosis not present

## 2021-01-08 DIAGNOSIS — F32A Depression, unspecified: Secondary | ICD-10-CM | POA: Diagnosis not present

## 2021-01-08 DIAGNOSIS — M50221 Other cervical disc displacement at C4-C5 level: Secondary | ICD-10-CM | POA: Diagnosis not present

## 2021-01-08 DIAGNOSIS — Y92009 Unspecified place in unspecified non-institutional (private) residence as the place of occurrence of the external cause: Secondary | ICD-10-CM

## 2021-01-08 DIAGNOSIS — W19XXXA Unspecified fall, initial encounter: Secondary | ICD-10-CM | POA: Diagnosis not present

## 2021-01-08 DIAGNOSIS — M1990 Unspecified osteoarthritis, unspecified site: Secondary | ICD-10-CM | POA: Diagnosis not present

## 2021-01-08 DIAGNOSIS — B961 Klebsiella pneumoniae [K. pneumoniae] as the cause of diseases classified elsewhere: Secondary | ICD-10-CM | POA: Diagnosis present

## 2021-01-08 DIAGNOSIS — Z789 Other specified health status: Secondary | ICD-10-CM | POA: Diagnosis not present

## 2021-01-08 DIAGNOSIS — W1830XA Fall on same level, unspecified, initial encounter: Secondary | ICD-10-CM | POA: Diagnosis present

## 2021-01-08 DIAGNOSIS — I10 Essential (primary) hypertension: Secondary | ICD-10-CM | POA: Diagnosis not present

## 2021-01-08 DIAGNOSIS — G9340 Encephalopathy, unspecified: Secondary | ICD-10-CM | POA: Diagnosis not present

## 2021-01-08 DIAGNOSIS — Z7951 Long term (current) use of inhaled steroids: Secondary | ICD-10-CM

## 2021-01-08 DIAGNOSIS — Z9181 History of falling: Secondary | ICD-10-CM | POA: Diagnosis not present

## 2021-01-08 DIAGNOSIS — R000 Tachycardia, unspecified: Secondary | ICD-10-CM | POA: Diagnosis not present

## 2021-01-08 DIAGNOSIS — S0990XA Unspecified injury of head, initial encounter: Secondary | ICD-10-CM | POA: Diagnosis not present

## 2021-01-08 DIAGNOSIS — R6889 Other general symptoms and signs: Secondary | ICD-10-CM | POA: Diagnosis not present

## 2021-01-08 DIAGNOSIS — R410 Disorientation, unspecified: Secondary | ICD-10-CM | POA: Diagnosis not present

## 2021-01-08 DIAGNOSIS — Z880 Allergy status to penicillin: Secondary | ICD-10-CM

## 2021-01-08 DIAGNOSIS — E118 Type 2 diabetes mellitus with unspecified complications: Secondary | ICD-10-CM | POA: Diagnosis present

## 2021-01-08 DIAGNOSIS — Z87440 Personal history of urinary (tract) infections: Secondary | ICD-10-CM | POA: Diagnosis not present

## 2021-01-08 DIAGNOSIS — R9431 Abnormal electrocardiogram [ECG] [EKG]: Secondary | ICD-10-CM | POA: Diagnosis not present

## 2021-01-08 DIAGNOSIS — E119 Type 2 diabetes mellitus without complications: Secondary | ICD-10-CM | POA: Diagnosis present

## 2021-01-08 DIAGNOSIS — Z79899 Other long term (current) drug therapy: Secondary | ICD-10-CM

## 2021-01-08 DIAGNOSIS — Z888 Allergy status to other drugs, medicaments and biological substances status: Secondary | ICD-10-CM

## 2021-01-08 DIAGNOSIS — Z885 Allergy status to narcotic agent status: Secondary | ICD-10-CM

## 2021-01-08 DIAGNOSIS — R531 Weakness: Secondary | ICD-10-CM | POA: Diagnosis not present

## 2021-01-08 DIAGNOSIS — Z7401 Bed confinement status: Secondary | ICD-10-CM | POA: Diagnosis not present

## 2021-01-08 DIAGNOSIS — R1310 Dysphagia, unspecified: Secondary | ICD-10-CM | POA: Diagnosis not present

## 2021-01-08 DIAGNOSIS — R296 Repeated falls: Secondary | ICD-10-CM | POA: Diagnosis present

## 2021-01-08 DIAGNOSIS — M50222 Other cervical disc displacement at C5-C6 level: Secondary | ICD-10-CM | POA: Diagnosis not present

## 2021-01-08 DIAGNOSIS — N8111 Cystocele, midline: Secondary | ICD-10-CM | POA: Diagnosis not present

## 2021-01-08 DIAGNOSIS — L89311 Pressure ulcer of right buttock, stage 1: Secondary | ICD-10-CM | POA: Diagnosis not present

## 2021-01-08 DIAGNOSIS — R0902 Hypoxemia: Secondary | ICD-10-CM | POA: Diagnosis not present

## 2021-01-08 DIAGNOSIS — H9190 Unspecified hearing loss, unspecified ear: Secondary | ICD-10-CM | POA: Diagnosis not present

## 2021-01-08 DIAGNOSIS — J9811 Atelectasis: Secondary | ICD-10-CM | POA: Diagnosis not present

## 2021-01-08 DIAGNOSIS — R5381 Other malaise: Secondary | ICD-10-CM | POA: Diagnosis not present

## 2021-01-08 DIAGNOSIS — K580 Irritable bowel syndrome with diarrhea: Secondary | ICD-10-CM | POA: Diagnosis not present

## 2021-01-08 DIAGNOSIS — Z743 Need for continuous supervision: Secondary | ICD-10-CM | POA: Diagnosis not present

## 2021-01-08 DIAGNOSIS — G319 Degenerative disease of nervous system, unspecified: Secondary | ICD-10-CM | POA: Diagnosis not present

## 2021-01-08 LAB — CBC WITH DIFFERENTIAL/PLATELET
Abs Immature Granulocytes: 0.02 10*3/uL (ref 0.00–0.07)
Basophils Absolute: 0 10*3/uL (ref 0.0–0.1)
Basophils Relative: 0 %
Eosinophils Absolute: 0.2 10*3/uL (ref 0.0–0.5)
Eosinophils Relative: 3 %
HCT: 36.3 % (ref 36.0–46.0)
Hemoglobin: 11.8 g/dL — ABNORMAL LOW (ref 12.0–15.0)
Immature Granulocytes: 0 %
Lymphocytes Relative: 24 %
Lymphs Abs: 1.7 10*3/uL (ref 0.7–4.0)
MCH: 31.9 pg (ref 26.0–34.0)
MCHC: 32.5 g/dL (ref 30.0–36.0)
MCV: 98.1 fL (ref 80.0–100.0)
Monocytes Absolute: 0.8 10*3/uL (ref 0.1–1.0)
Monocytes Relative: 11 %
Neutro Abs: 4.5 10*3/uL (ref 1.7–7.7)
Neutrophils Relative %: 62 %
Platelets: 217 10*3/uL (ref 150–400)
RBC: 3.7 MIL/uL — ABNORMAL LOW (ref 3.87–5.11)
RDW: 13.4 % (ref 11.5–15.5)
WBC: 7.3 10*3/uL (ref 4.0–10.5)
nRBC: 0 % (ref 0.0–0.2)

## 2021-01-08 NOTE — ED Provider Notes (Signed)
Emergency Medicine Provider Triage Evaluation Note  Nichole Silva , a 86 y.o. female  was evaluated in triage.  Pt complains of generalized weakness and fall.  Patient is poor historian and unable to tell us the circumstances regarding the fall.  EMS states that family reports patient at mental baseline.  Review of Systems  Positive: Generalized weakness Negative: Confusion  Physical Exam  There were no vitals taken for this visit. Gen:   Awake, no distress   Resp:  Normal effort  MSK:   Moves extremities without difficulty  Other:  Moving all extremities patient extremely hard of hearing and unable to assess for pain.  No facial asymmetry.  Pupils equal and reactive to light bilaterally  Medical Decision Making  Medically screening exam initiated at 9:45 PM.  Appropriate orders placed.  Nichole Silva was informed that the remainder of the evaluation will be completed by another provider, this initial triage assessment does not replace that evaluation, and the importance of remaining in the ED until their evaluation is complete.  Lab work and EKG ordered   Nichole Silva, Nichole Silva 01/08/21 [id number]    Daleen Bo, MD 01/08/21 [id number]

## 2021-01-08 NOTE — ED Triage Notes (Signed)
Per EMS, pt and family poor historian.  Pt fell today, not sure when.  Reports she did not her head or have LOC.  No obvious injuries but c/o weakness after fall.    Unsure of blood thinners 128/60 HR 62 CBG 118 94% on RA

## 2021-01-08 NOTE — ED Notes (Signed)
Family called to give updated info on pt. Triage nurse has received this information

## 2021-01-09 ENCOUNTER — Emergency Department (HOSPITAL_COMMUNITY): Payer: Medicare Other

## 2021-01-09 DIAGNOSIS — N390 Urinary tract infection, site not specified: Secondary | ICD-10-CM | POA: Diagnosis present

## 2021-01-09 DIAGNOSIS — Y92009 Unspecified place in unspecified non-institutional (private) residence as the place of occurrence of the external cause: Secondary | ICD-10-CM | POA: Diagnosis not present

## 2021-01-09 DIAGNOSIS — R410 Disorientation, unspecified: Secondary | ICD-10-CM | POA: Diagnosis not present

## 2021-01-09 DIAGNOSIS — W19XXXA Unspecified fall, initial encounter: Secondary | ICD-10-CM

## 2021-01-09 DIAGNOSIS — R627 Adult failure to thrive: Secondary | ICD-10-CM | POA: Diagnosis present

## 2021-01-09 LAB — BASIC METABOLIC PANEL
Anion gap: 5 (ref 5–15)
BUN: 10 mg/dL (ref 8–23)
CO2: 30 mmol/L (ref 22–32)
Calcium: 9.1 mg/dL (ref 8.9–10.3)
Chloride: 104 mmol/L (ref 98–111)
Creatinine, Ser: 0.81 mg/dL (ref 0.44–1.00)
GFR, Estimated: >60 mL/min (ref >60)
Glucose, Bld: 102 mg/dL — ABNORMAL HIGH (ref 70–99)
Potassium: 3.5 mmol/L (ref 3.5–5.1)
Sodium: 139 mmol/L (ref 135–145)

## 2021-01-09 LAB — URINALYSIS, ROUTINE W REFLEX MICROSCOPIC
Bilirubin Urine: NEGATIVE
Glucose, UA: NEGATIVE mg/dL
Ketones, ur: NEGATIVE mg/dL
Nitrite: POSITIVE — AB
Protein, ur: NEGATIVE mg/dL
Specific Gravity, Urine: 1.008 (ref 1.005–1.030)
WBC, UA: >50 WBC/hpf — ABNORMAL HIGH (ref 0–5)
pH: 8 (ref 5.0–8.0)

## 2021-01-09 LAB — TROPONIN I (HIGH SENSITIVITY)
Troponin I (High Sensitivity): 5 ng/L (ref <18)
Troponin I (High Sensitivity): 7 ng/L (ref <18)

## 2021-01-09 LAB — RESP PANEL BY RT-PCR (FLU A&B, COVID) ARPGX2
Influenza A by PCR: NEGATIVE
Influenza B by PCR: NEGATIVE
SARS Coronavirus 2 by RT PCR: NEGATIVE

## 2021-01-09 IMAGING — CT CT HEAD W/O CM
4 series · 16 of 47 positions shown, 18 images · non-contrast
Comparison: CT head dated May 27, 2016.

CLINICAL DATA: Fall.

EXAM:
CT HEAD WITHOUT CONTRAST
CT CERVICAL SPINE WITHOUT CONTRAST
TECHNIQUE: Multidetector CT imaging of the head and cervical spine was
performed following the standard protocol without intravenous
contrast. Multiplanar CT image reconstructions of the cervical spine
were also generated.

[Series 3: head bone · axial · 0.43mm/px · z∈[-91,-59]mm · 3 of 82 slices shown]
[im 9/82  bone]
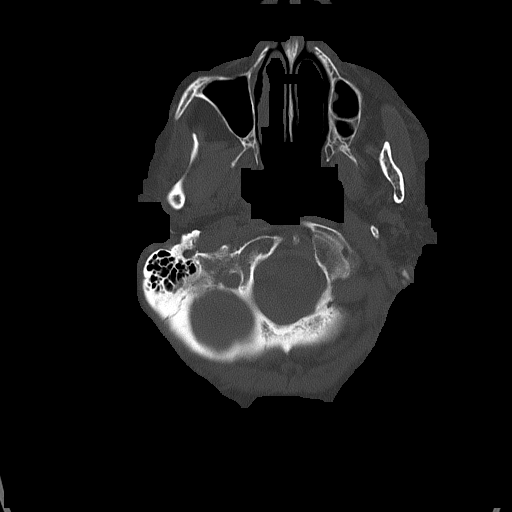
[im 17/82  bone]
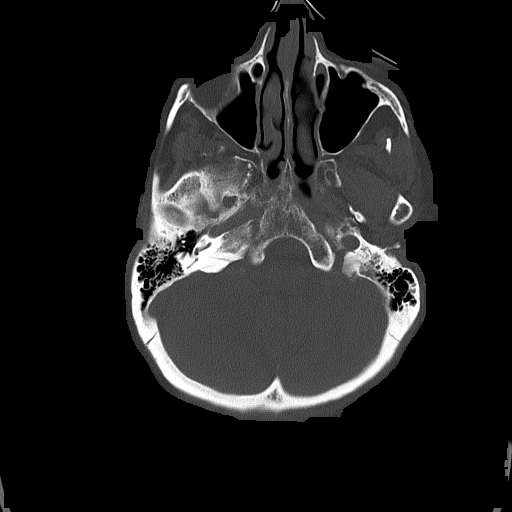
[im 25/82  bone]
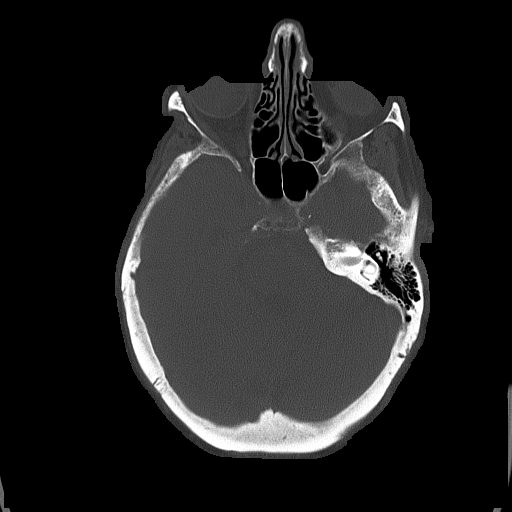

[Series 4: head without · axial · non-contrast · 0.43mm/px · z∈[-87,+33]mm · 7 of 33 slices shown, 9 images]
[im 5/33  brain]
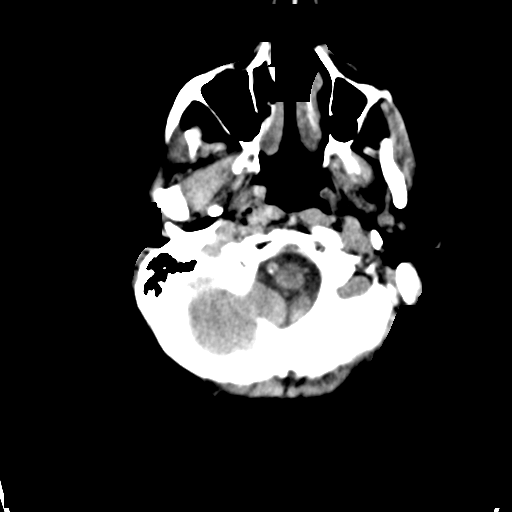
[im 5/33  bone]
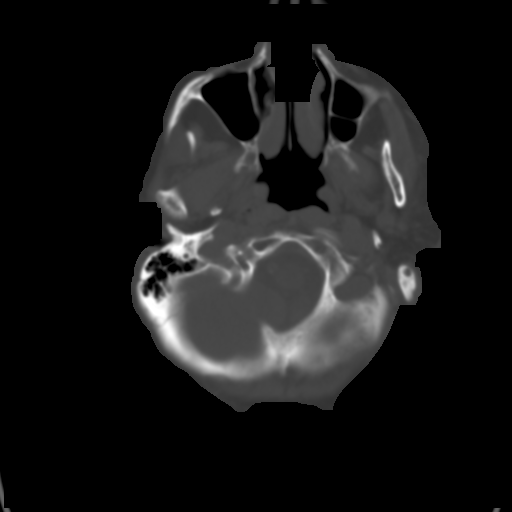
[im 9/33  brain]
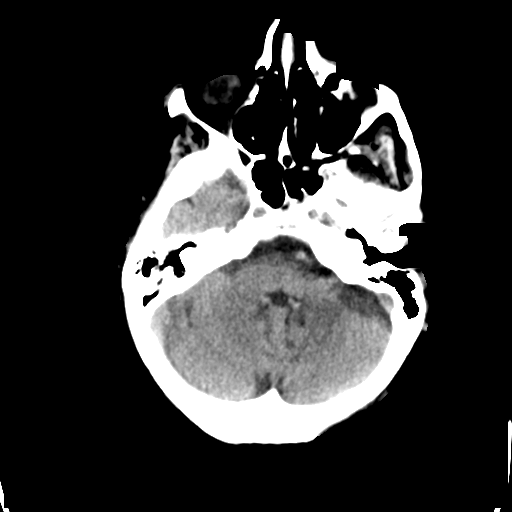
[im 13/33  brain]
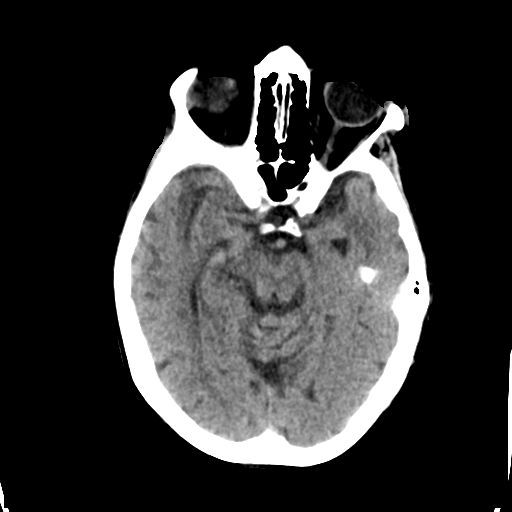
[im 17/33  brain]
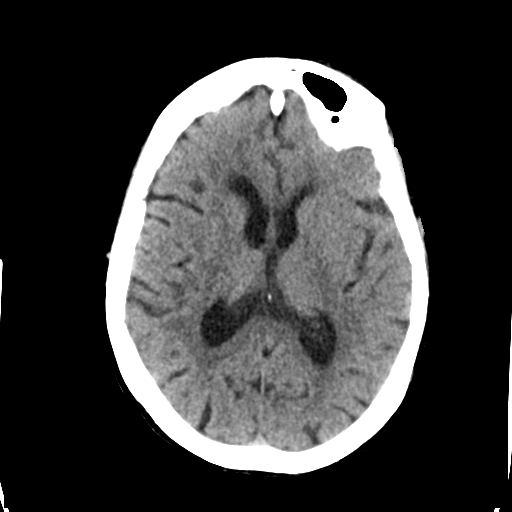
[im 21/33  brain]
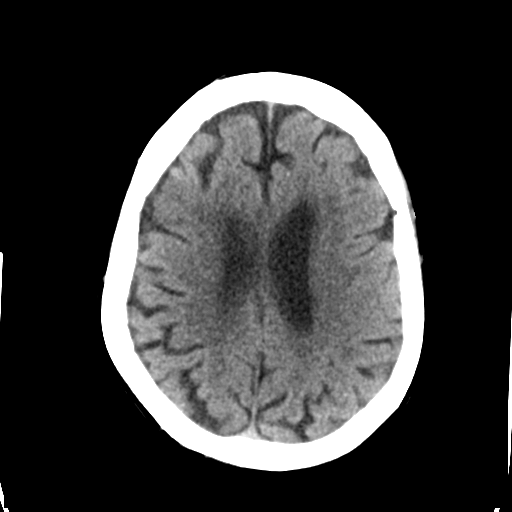
[im 21/33  bone]
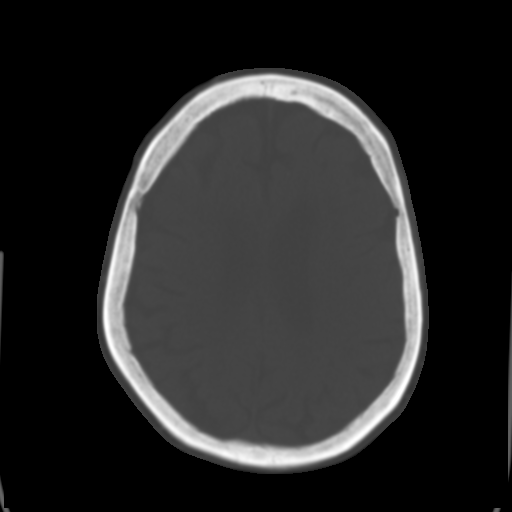
[im 25/33  brain]
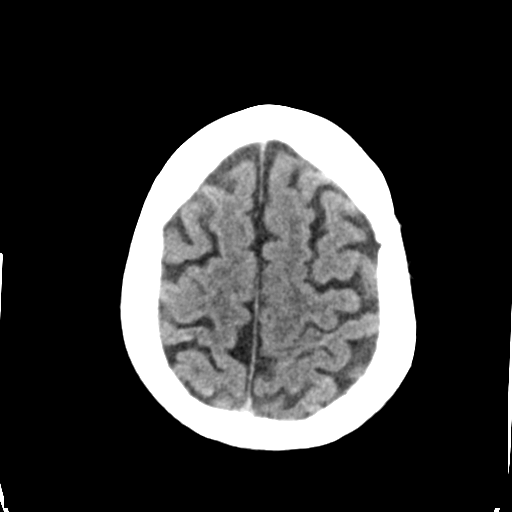
[im 29/33  brain]
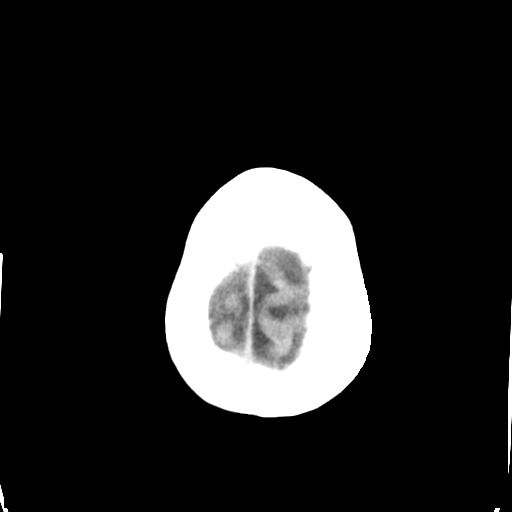

[Series 5: head without cor · coronal · non-contrast · 0.31mm/px · 3 of 64 slices shown]
[im 22/64  brain]
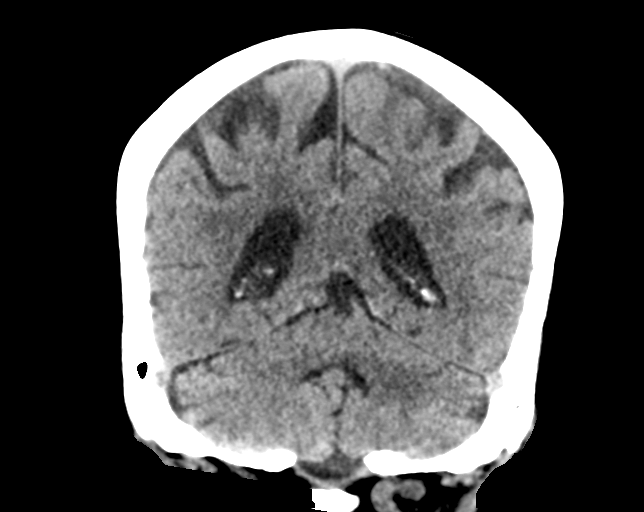
[im 29/64  brain]
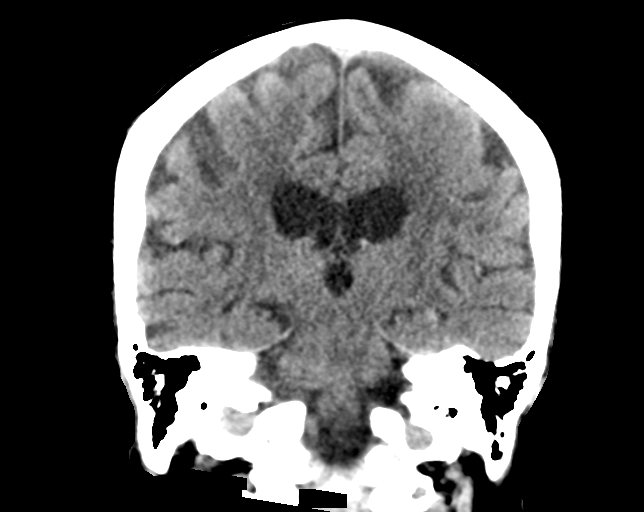
[im 36/64  brain]
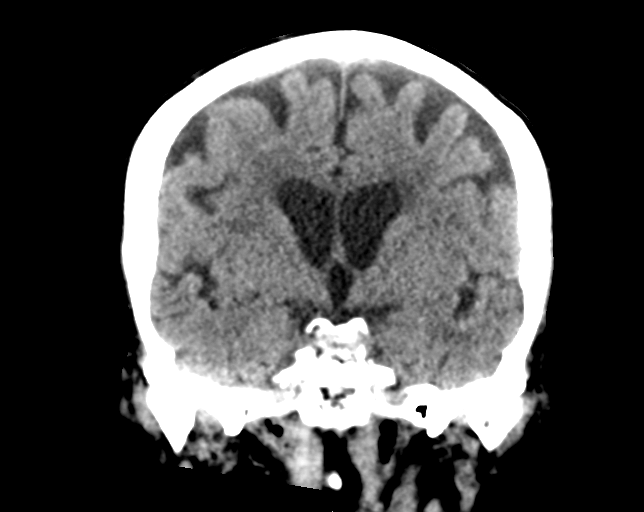

[Series 6: head without sag · sagittal · non-contrast · 0.31mm/px · 3 of 51 slices shown]
[im 17/51  brain]
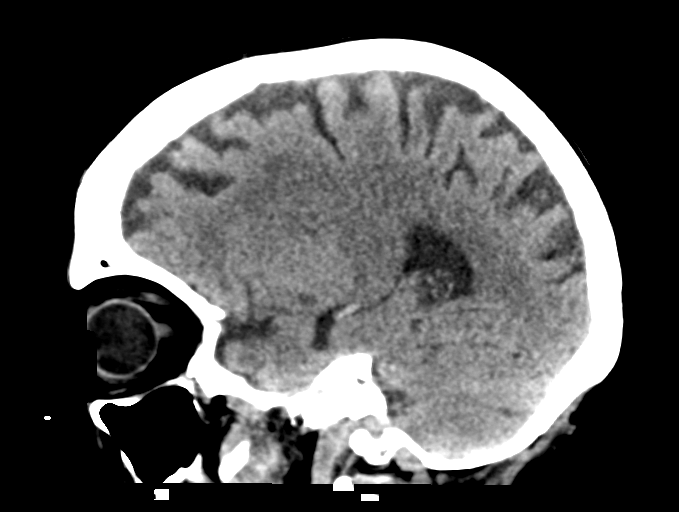
[im 26/51  brain]
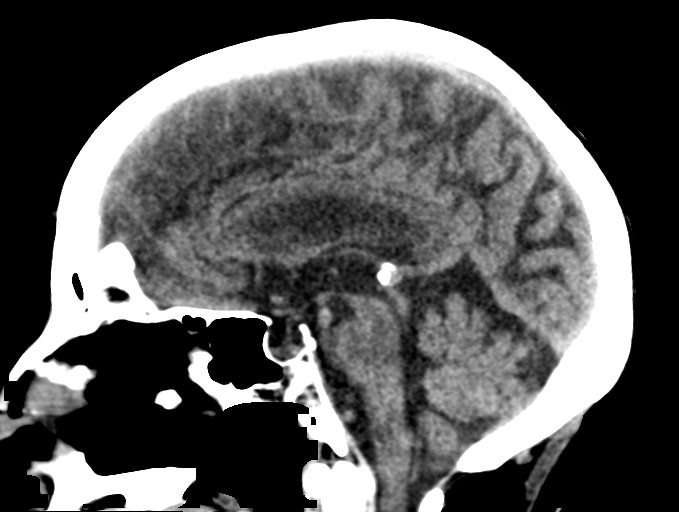
[im 34/51  brain]
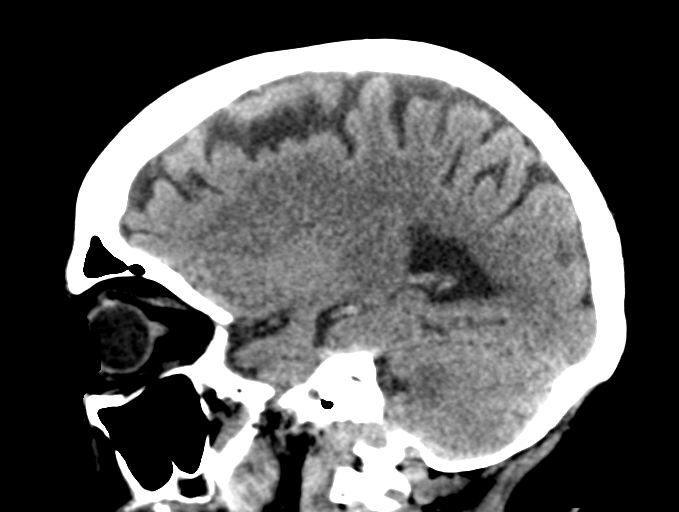

[16 of 47 positions shown; findings below may reference images not displayed]

FINDINGS: CT HEAD FINDINGS

Brain: No evidence of acute infarction, hemorrhage, hydrocephalus,
extra-axial collection or mass lesion/mass effect. Stable mild
atrophy and chronic microvascular ischemic changes.

Vascular: Atherosclerotic vascular calcification of the carotid
siphons. No hyperdense vessel.

Skull: Normal. Negative for fracture or focal lesion.

Sinuses/Orbits: No acute finding.

Other: None.

CT CERVICAL SPINE FINDINGS

Alignment: Normal.

Skull base and vertebrae: No acute fracture. No primary bone lesion
or focal pathologic process.

Soft tissues and spinal canal: No prevertebral fluid or swelling. No
visible canal hematoma.

Disc levels: Small disc protrusions at C4-C5 and C5-C6. Mild
bilateral uncovertebral hypertrophy at C5-C6.

Upper chest: Negative.

Other: None.
IMPRESSION: 1. No acute intracranial abnormality.
2. No acute cervical spine fracture or traumatic listhesis.

## 2021-01-09 IMAGING — CR DG PELVIS 1-2V
1 series · 1 of 1 positions shown · non-contrast
Comparison: CT abdomen pelvis dated May 27, 2016.

CLINICAL DATA: Fall.

EXAM:
PELVIS - 1-2 VIEW

[pelvis ap]
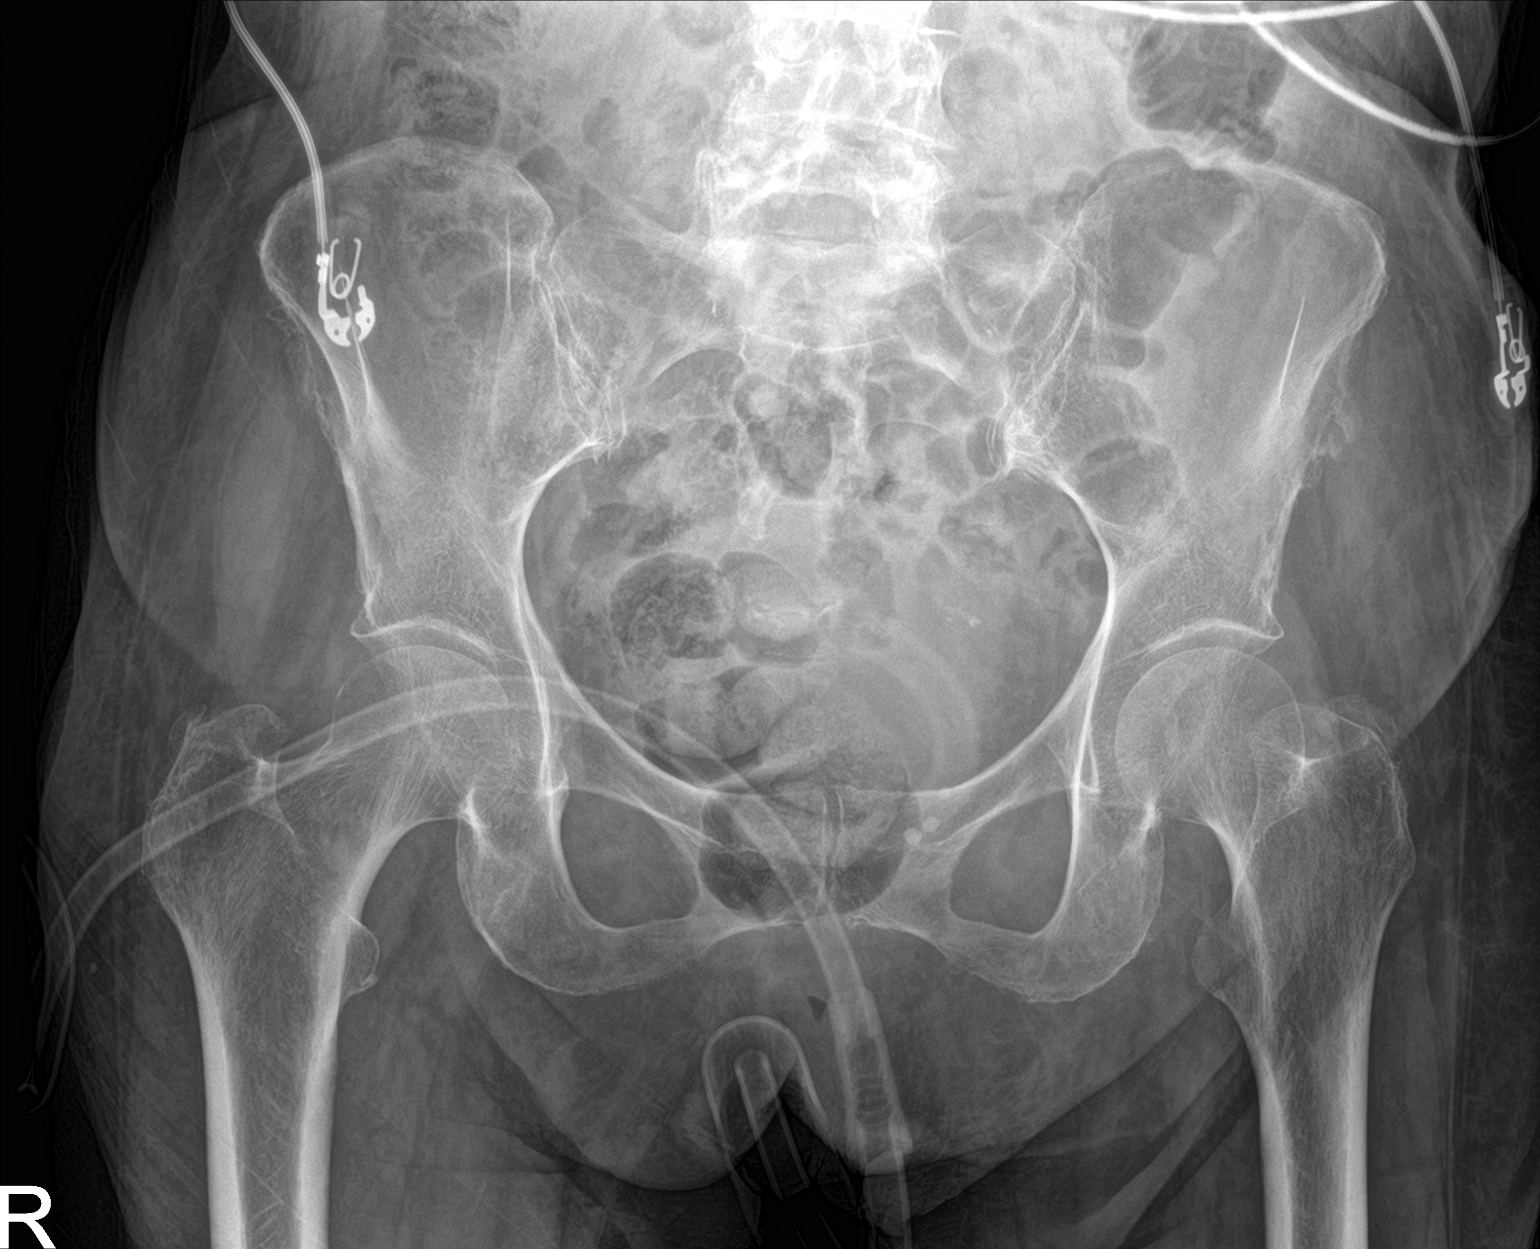

[1 of 1 positions shown; findings below may reference images not displayed]

FINDINGS: There is no evidence of pelvic fracture or diastasis. No pelvic bone
lesions are seen.
IMPRESSION: Negative.

## 2021-01-09 IMAGING — CT CT CERVICAL SPINE W/O CM
3 of 4 series · 12 of 33 positions shown, 14 images · non-contrast
Comparison: CT head dated May 27, 2016.

CLINICAL DATA: Fall.

EXAM:
CT HEAD WITHOUT CONTRAST
CT CERVICAL SPINE WITHOUT CONTRAST
TECHNIQUE: Multidetector CT imaging of the head and cervical spine was
performed following the standard protocol without intravenous
contrast. Multiplanar CT image reconstructions of the cervical spine
were also generated.

[Series 4: c_spine 2.0 orthogonals · oblique · 0.21mm/px · 4 of 90 slices shown, 5 images]
[im 15/90  soft-tissue]
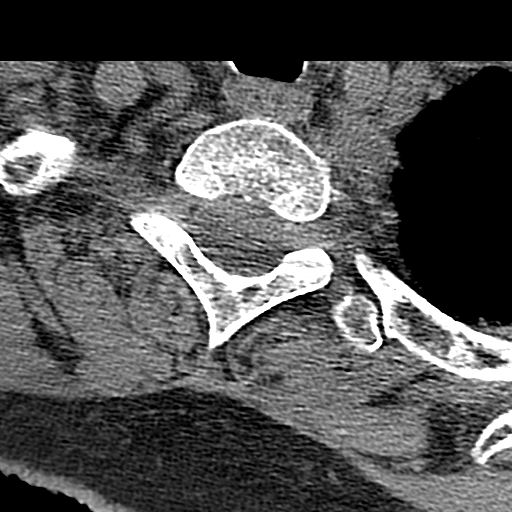
[im 15/90  bone]
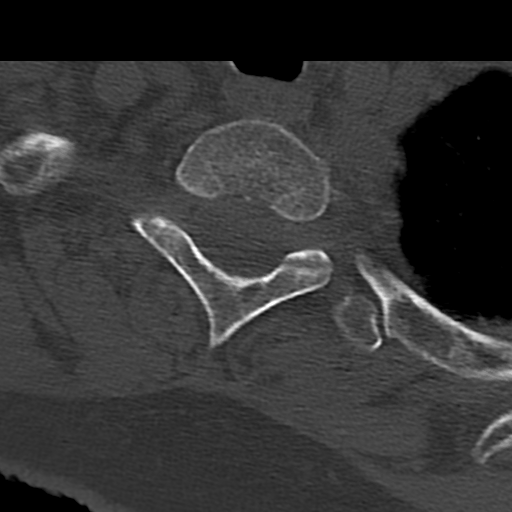
[im 30/90  bone]
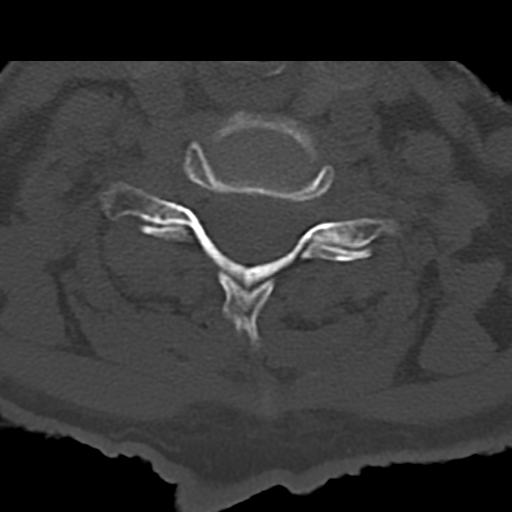
[im 60/90  bone]
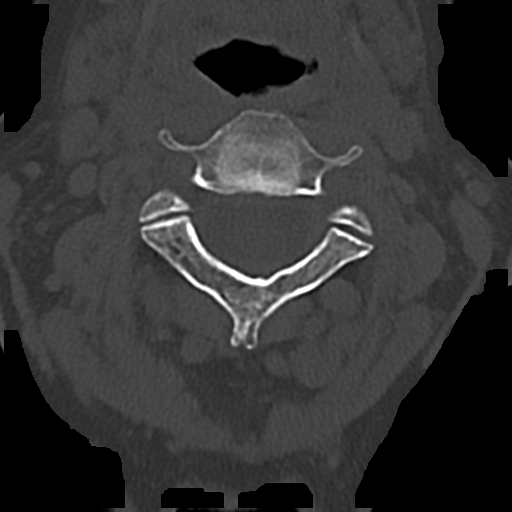
[im 75/90  bone]
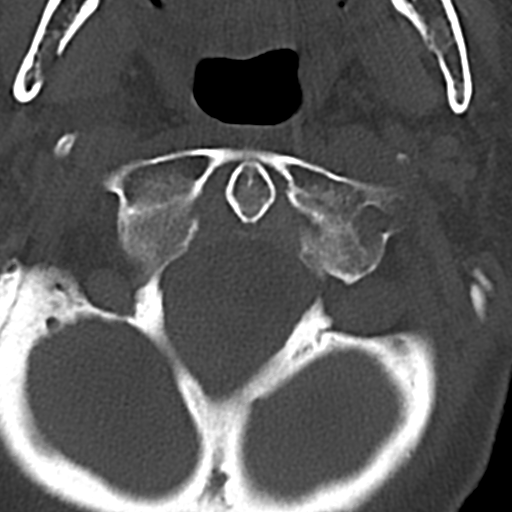

[Series 9: c_spine 2.0 sag bone · sagittal · 0.26mm/px · 5 of 61 slices shown, 6 images]
[im 21/61  bone]
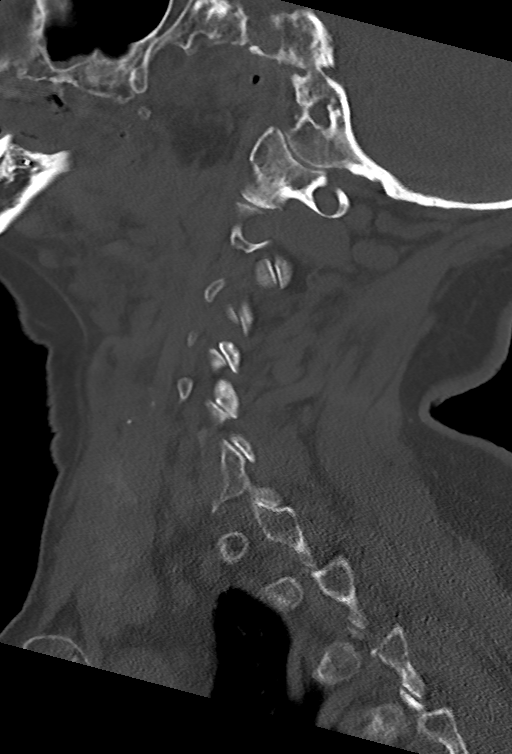
[im 26/61  bone]
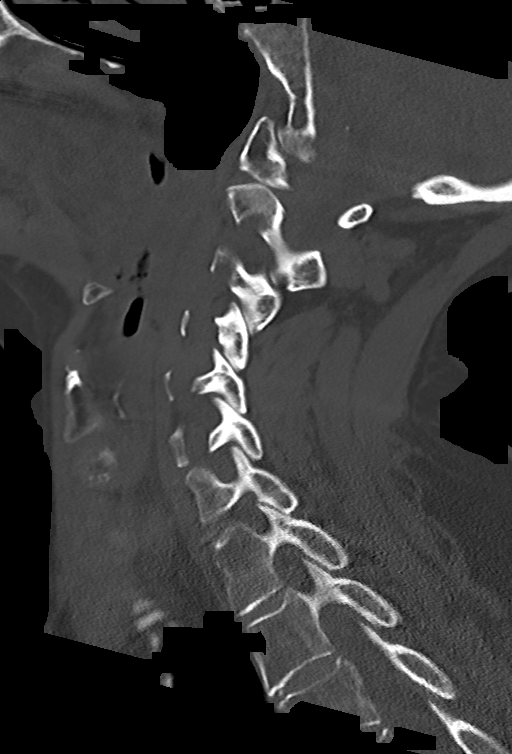
[im 31/61  soft-tissue]
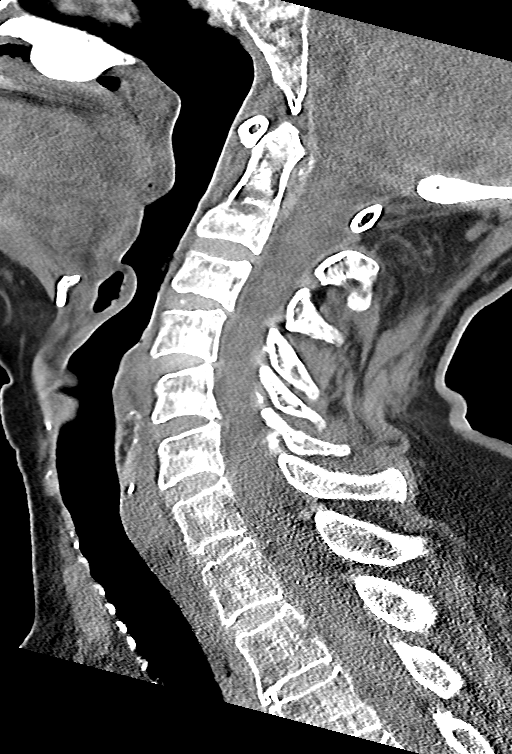
[im 31/61  bone]
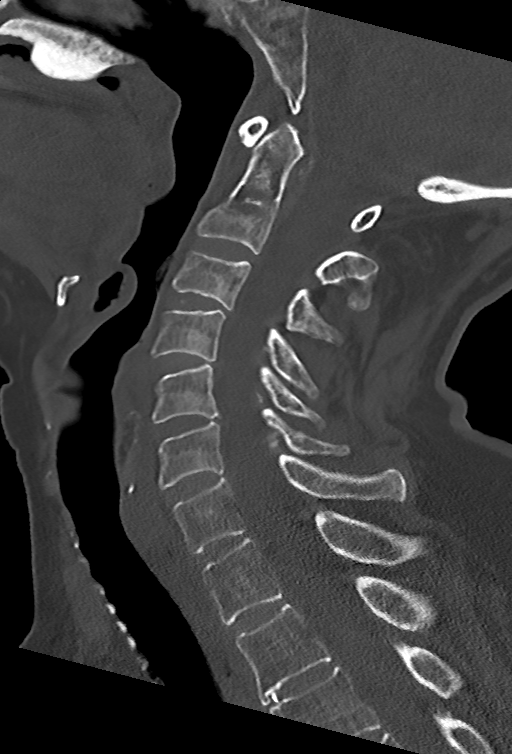
[im 36/61  bone]
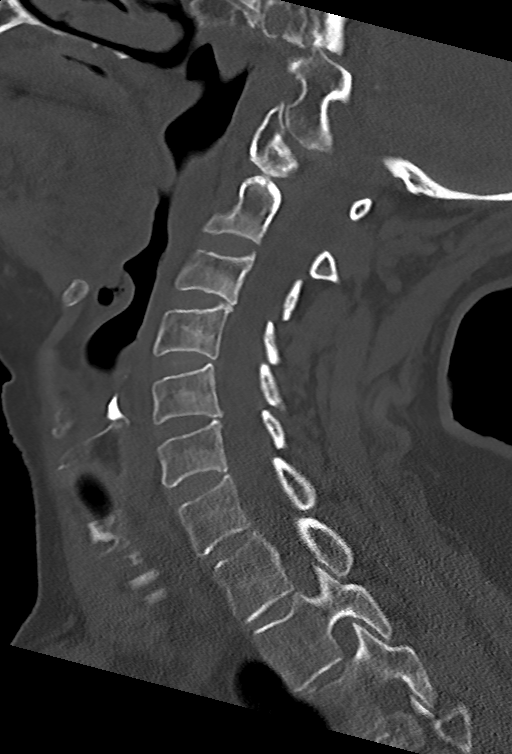
[im 41/61  bone]
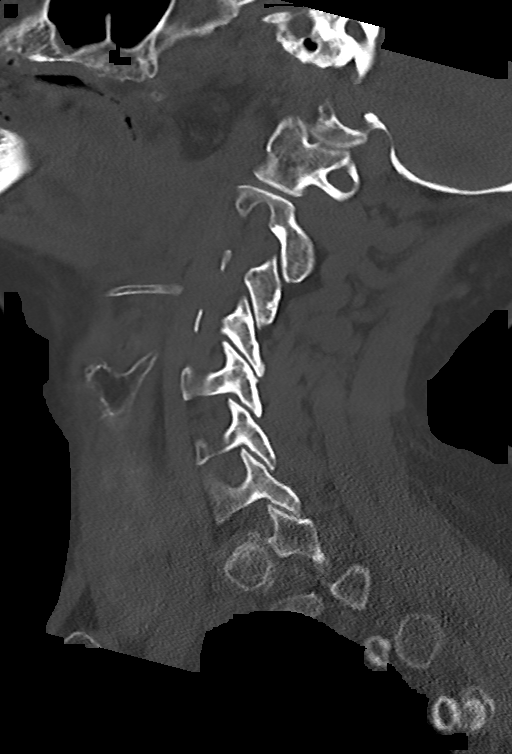

[Series 10: c_spine 2.0 cor bone · coronal · 0.23mm/px · 3 of 66 slices shown]
[im 14/66  bone]
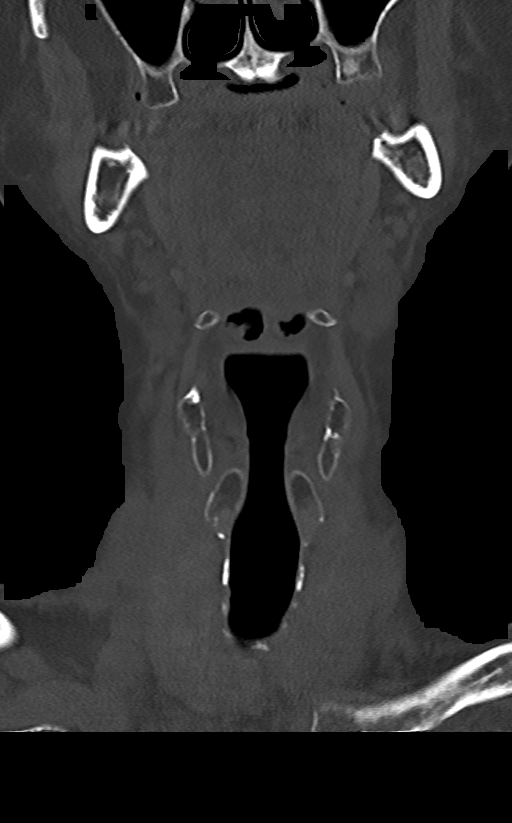
[im 27/66  bone]
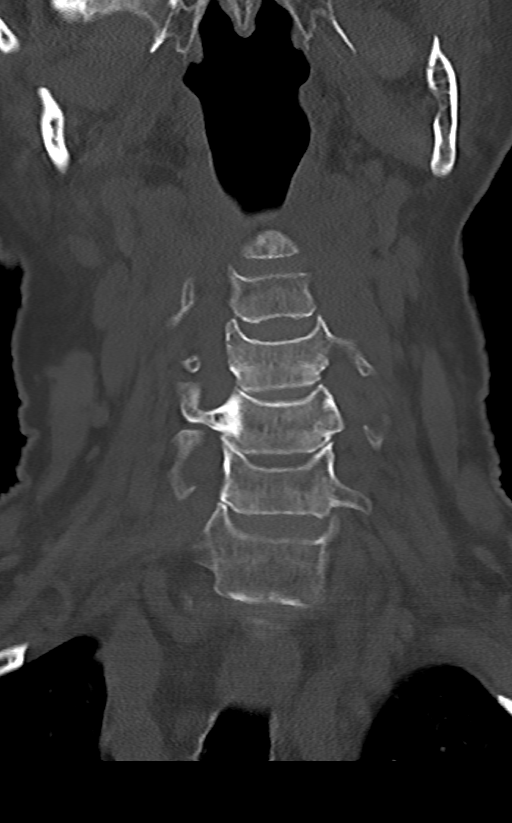
[im 40/66  bone]
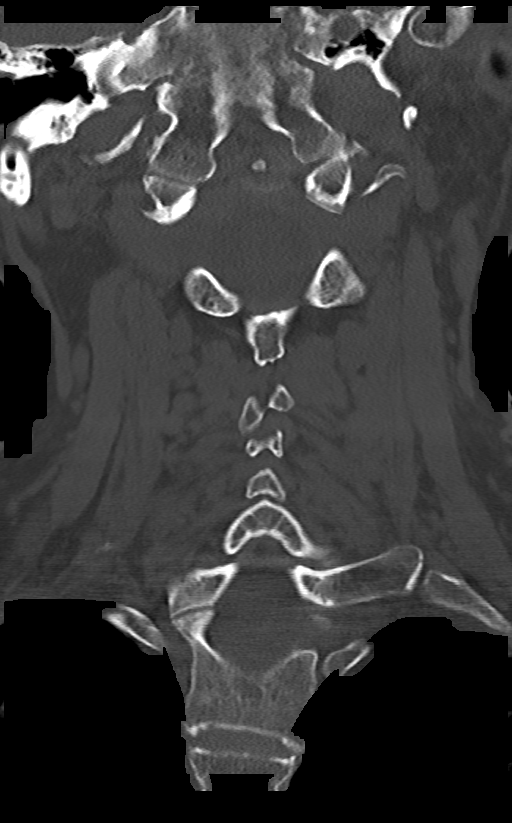

[12 of 33 positions shown; findings below may reference images not displayed]

FINDINGS: CT HEAD FINDINGS

Brain: No evidence of acute infarction, hemorrhage, hydrocephalus,
extra-axial collection or mass lesion/mass effect. Stable mild
atrophy and chronic microvascular ischemic changes.

Vascular: Atherosclerotic vascular calcification of the carotid
siphons. No hyperdense vessel.

Skull: Normal. Negative for fracture or focal lesion.

Sinuses/Orbits: No acute finding.

Other: None.

CT CERVICAL SPINE FINDINGS

Alignment: Normal.

Skull base and vertebrae: No acute fracture. No primary bone lesion
or focal pathologic process.

Soft tissues and spinal canal: No prevertebral fluid or swelling. No
visible canal hematoma.

Disc levels: Small disc protrusions at C4-C5 and C5-C6. Mild
bilateral uncovertebral hypertrophy at C5-C6.

Upper chest: Negative.

Other: None.
IMPRESSION: 1. No acute intracranial abnormality.
2. No acute cervical spine fracture or traumatic listhesis.

## 2021-01-09 IMAGING — CR DG RIBS W/ CHEST 3+V*L*
2 series · 3 of 3 positions shown · non-contrast
Comparison: 04/06/2019

CLINICAL DATA: Status post fall.  Pain

EXAM:
LEFT RIBS AND CHEST - 3+ VIEW

[Series 6: rib ap obl · 0.14mm/px · 2 of 2 slices shown]
[im 1/2]
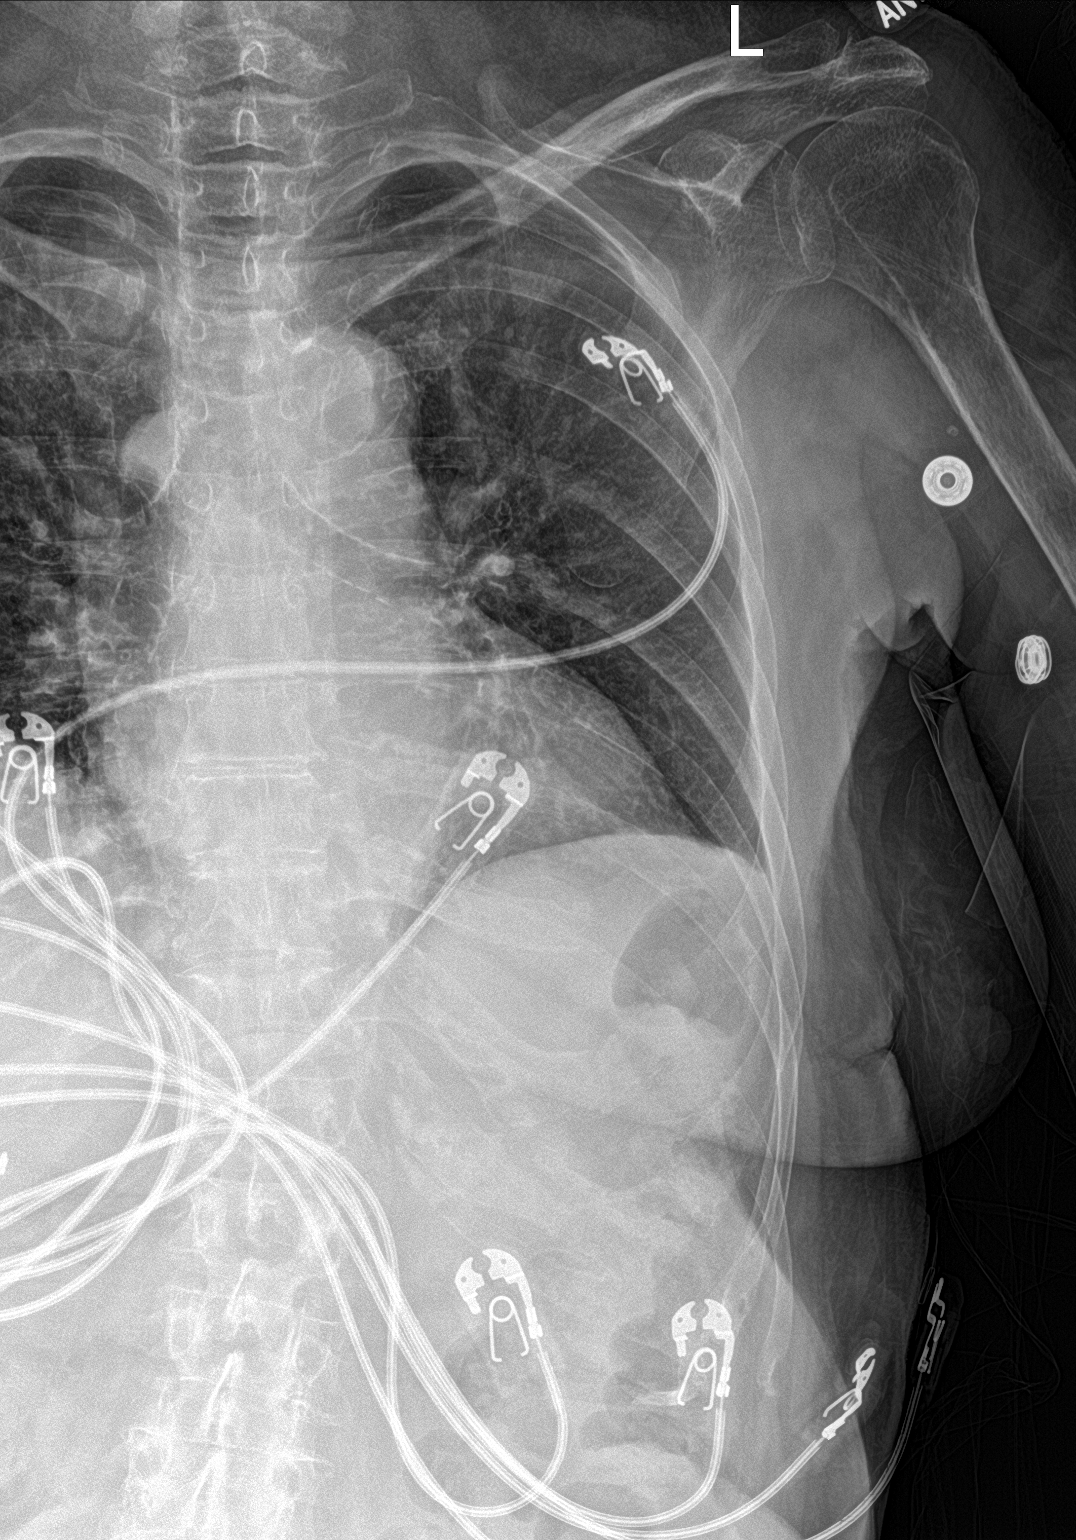
[im 2/2]
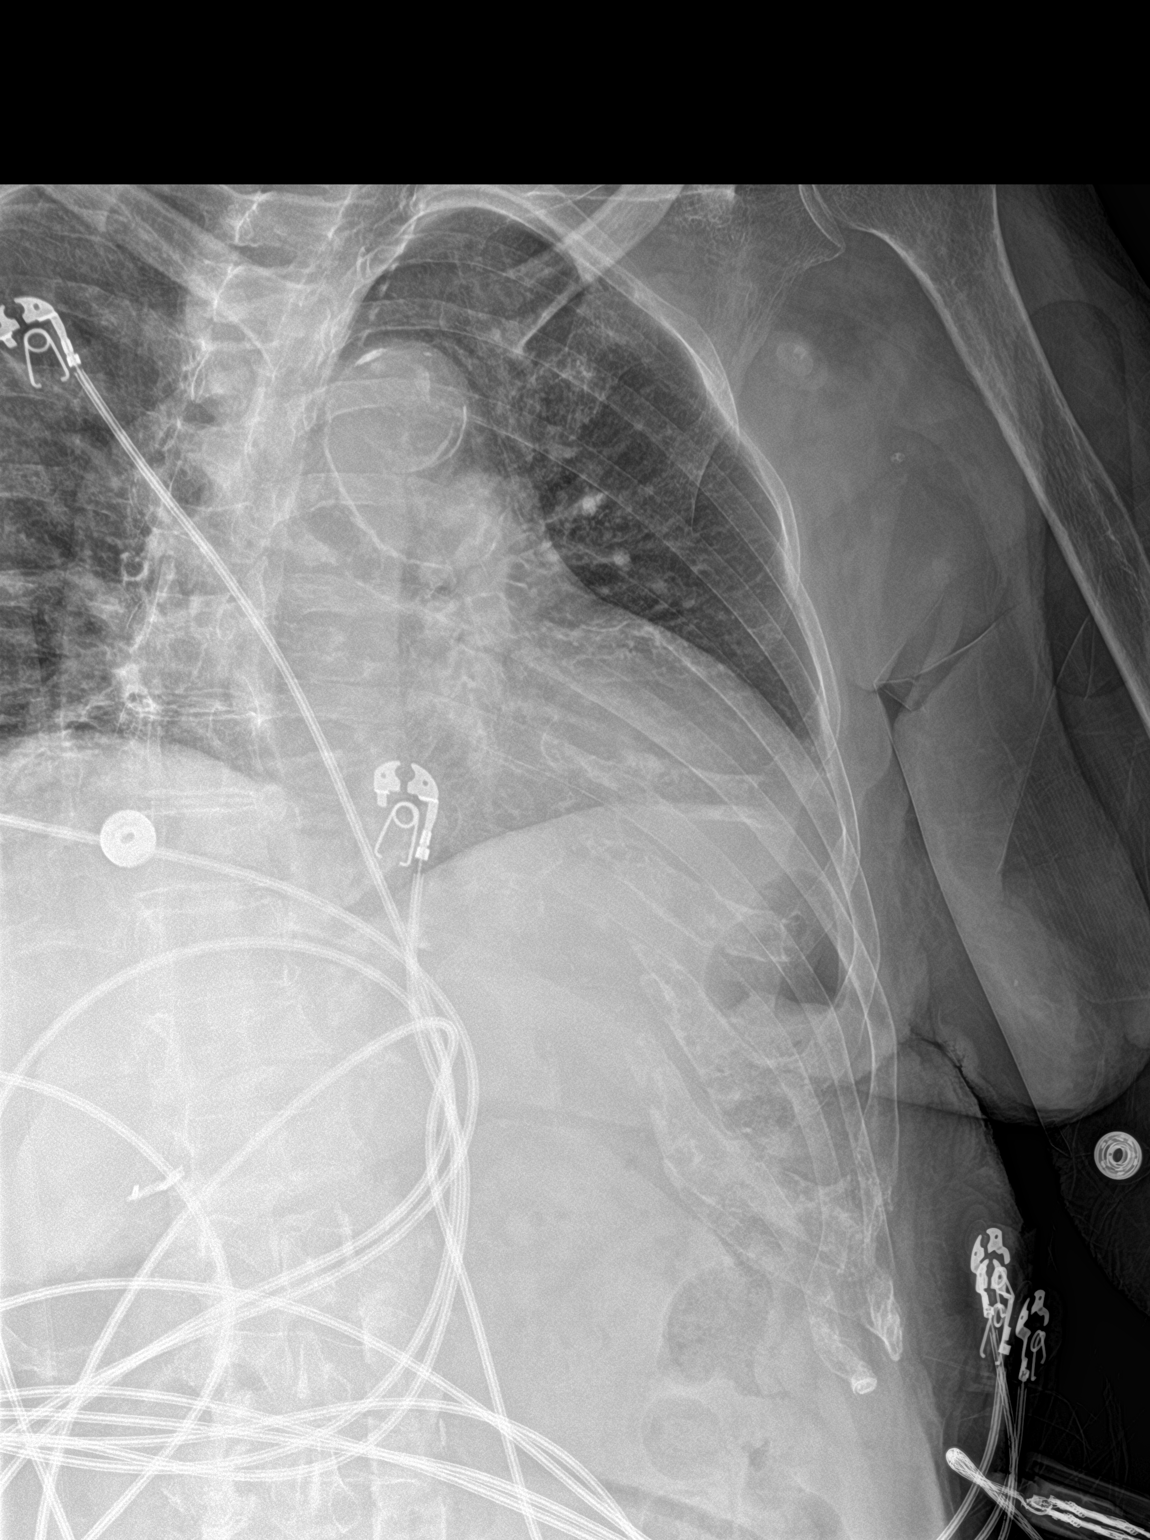

[chest ap]
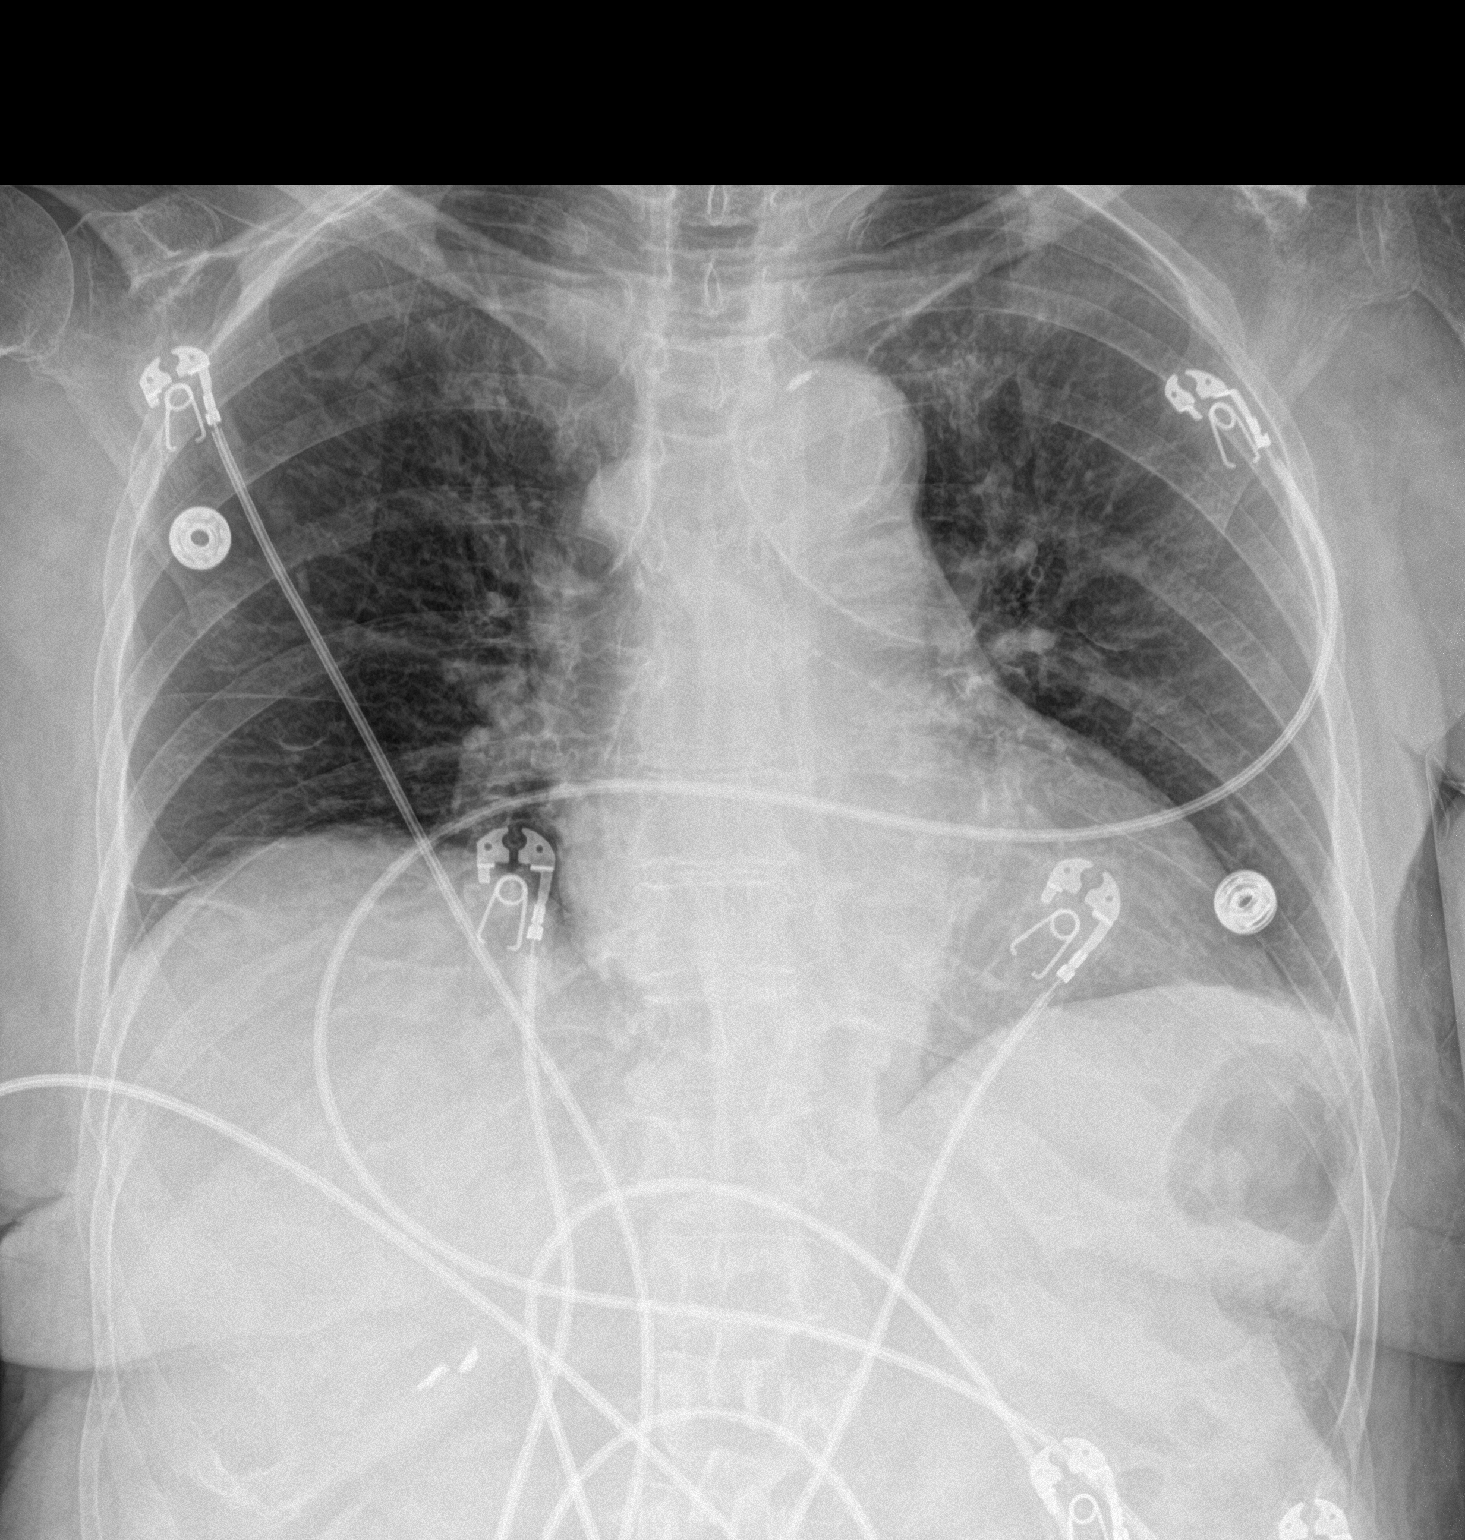

[3 of 3 positions shown; findings below may reference images not displayed]

FINDINGS: No fracture or other bone lesions are seen involving the ribs. There
is no evidence of pneumothorax or pleural effusion. Lung volumes are
low. There is mild platelike atelectasis in the lung bases. Heart
size and mediastinal contours are within normal limits.
IMPRESSION: 1. No rib fractures identified.
2. Low lung volumes and bibasilar atelectasis.

## 2021-01-09 MED ORDER — HYDRALAZINE HCL 20 MG/ML IJ SOLN: 5.0000 mg | INTRAMUSCULAR | Status: DC | PRN

## 2021-01-09 MED ORDER — DICYCLOMINE HCL 10 MG PO CAPS
10.0000 mg | ORAL_CAPSULE | Freq: Three times a day (TID) | ORAL | Status: DC
  Administered 2021-01-09 – 2021-01-12 (×7): 10 mg via ORAL
  Filled 2021-01-09 (×10): qty 1

## 2021-01-09 MED ORDER — POLYETHYLENE GLYCOL 3350 17 G PO PACK: 17.0000 g | PACK | Freq: Every day | ORAL | Status: DC | PRN

## 2021-01-09 MED ORDER — ONDANSETRON HCL 4 MG/2ML IJ SOLN
4.0000 mg | Freq: Four times a day (QID) | INTRAMUSCULAR | Status: DC | PRN
  Administered 2021-01-09: 4 mg via INTRAVENOUS
  Filled 2021-01-09: qty 2

## 2021-01-09 MED ORDER — SODIUM CHLORIDE 0.9% FLUSH
3.0000 mL | Freq: Two times a day (BID) | INTRAVENOUS | Status: DC
  Administered 2021-01-09 – 2021-01-11 (×5): 3 mL via INTRAVENOUS

## 2021-01-09 MED ORDER — LOSARTAN POTASSIUM 50 MG PO TABS
50.0000 mg | ORAL_TABLET | Freq: Every day | ORAL | Status: DC
  Administered 2021-01-09 – 2021-01-12 (×3): 50 mg via ORAL
  Filled 2021-01-09 (×3): qty 1

## 2021-01-09 MED ORDER — SODIUM CHLORIDE 0.9 % IV SOLN
1.0000 g | Freq: Once | INTRAVENOUS | Status: AC
  Administered 2021-01-09: 1 g via INTRAVENOUS
  Filled 2021-01-09: qty 10

## 2021-01-09 MED ORDER — HYDROCODONE-ACETAMINOPHEN 5-325 MG PO TABS: 1.0000 | ORAL_TABLET | ORAL | Status: DC | PRN

## 2021-01-09 MED ORDER — ALPRAZOLAM 0.5 MG PO TABS
1.0000 mg | ORAL_TABLET | Freq: Three times a day (TID) | ORAL | Status: DC | PRN
  Administered 2021-01-09: 1 mg via ORAL
  Filled 2021-01-09 (×2): qty 4
  Filled 2021-01-09: qty 2

## 2021-01-09 MED ORDER — ATORVASTATIN CALCIUM 40 MG PO TABS
40.0000 mg | ORAL_TABLET | Freq: Every evening | ORAL | Status: DC
  Administered 2021-01-11: 40 mg via ORAL
  Filled 2021-01-09 (×2): qty 1

## 2021-01-09 MED ORDER — ONDANSETRON HCL 4 MG PO TABS: 4.0000 mg | ORAL_TABLET | Freq: Four times a day (QID) | ORAL | Status: DC | PRN

## 2021-01-09 MED ORDER — ALBUTEROL SULFATE (2.5 MG/3ML) 0.083% IN NEBU: 3.0000 mL | INHALATION_SOLUTION | RESPIRATORY_TRACT | Status: DC | PRN

## 2021-01-09 MED ORDER — ACETAMINOPHEN 325 MG PO TABS
650.0000 mg | ORAL_TABLET | Freq: Four times a day (QID) | ORAL | Status: DC | PRN
  Administered 2021-01-09: 650 mg via ORAL
  Filled 2021-01-09: qty 2

## 2021-01-09 MED ORDER — BISACODYL 5 MG PO TBEC: 5.0000 mg | DELAYED_RELEASE_TABLET | Freq: Every day | ORAL | Status: DC | PRN

## 2021-01-09 MED ORDER — FLUTICASONE PROPIONATE 50 MCG/ACT NA SUSP
2.0000 | Freq: Every evening | NASAL | Status: DC | PRN
  Filled 2021-01-09: qty 16

## 2021-01-09 MED ORDER — ACETAMINOPHEN 650 MG RE SUPP: 650.0000 mg | Freq: Four times a day (QID) | RECTAL | Status: DC | PRN

## 2021-01-09 MED ORDER — ENOXAPARIN SODIUM 40 MG/0.4ML IJ SOSY
40.0000 mg | PREFILLED_SYRINGE | INTRAMUSCULAR | Status: DC
  Administered 2021-01-09 – 2021-01-11 (×3): 40 mg via SUBCUTANEOUS
  Filled 2021-01-09 (×3): qty 0.4

## 2021-01-09 MED ORDER — LORATADINE 10 MG PO TABS
10.0000 mg | ORAL_TABLET | Freq: Every day | ORAL | Status: DC
  Administered 2021-01-11 – 2021-01-12 (×2): 10 mg via ORAL
  Filled 2021-01-09 (×3): qty 1

## 2021-01-09 MED ORDER — MORPHINE SULFATE (PF) 2 MG/ML IV SOLN
2.0000 mg | INTRAVENOUS | Status: DC | PRN
Start: 2021-01-09 — End: 2021-01-10
  Filled 2021-01-09: qty 1

## 2021-01-09 MED ORDER — FLUTICASONE FUROATE-VILANTEROL 200-25 MCG/INH IN AEPB
1.0000 | INHALATION_SPRAY | Freq: Every day | RESPIRATORY_TRACT | Status: DC
  Administered 2021-01-12: 1 via RESPIRATORY_TRACT
  Filled 2021-01-09 (×2): qty 28

## 2021-01-09 MED ORDER — DOCUSATE SODIUM 100 MG PO CAPS
100.0000 mg | ORAL_CAPSULE | Freq: Two times a day (BID) | ORAL | Status: DC
  Administered 2021-01-09 – 2021-01-11 (×4): 100 mg via ORAL
  Filled 2021-01-09 (×4): qty 1

## 2021-01-09 MED ORDER — SODIUM CHLORIDE 0.9 % IV SOLN
1.0000 g | INTRAVENOUS | Status: DC
  Administered 2021-01-10 – 2021-01-12 (×3): 1 g via INTRAVENOUS
  Filled 2021-01-09 (×3): qty 10

## 2021-01-09 NOTE — H&P (Signed)
History and Physical    Joddie Digiorgio [id number] DOB: 1935-04-22 DOA: 01/08/2021  PCP: Janith Lima, MD Consultants:  Georgina Snell - sports med; Marvel Plan - GYN Patient coming from:  Home - lives with husband, daughter; Donald Prose: Husband, [phone]; Daughter, Tye [place], [phone]  Chief Complaint: Fall  HPI: Nichole Silva is a 86 y.o. female with medical history significant of CAD; HLD; DM; vaginal prolapse with pessary; and HTN presenting with a fall.   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  She was in the living room and picking up O2 tanks and fell.  Her daughter thinks it was imbalance due to the tanks rather than dizziness/syncope.  Her daughter was able to get her in a sitting position.  She moved eventually into another room and tripped over the oxygen tubing in another room and they could not get her up.  They called 911 and they were able to get her up and brought her in to the ER.  The previous day she was at the [facility] with her husband and walked all day without difficulty, but yesterday she did not seem like herself.  She has significant hearing impairment and this makes it hard for them to know for sure what is happening with her.  She usually manages her own and her husband's affairs but struggles with the oxygen tanks  She has lost about 7 pounds in the last year.  She did have a UTI earlier this year, treated by Dr. Marvel Plan; she is at increased risk for them due to prolapse/pessary.  No obvious urinary symptoms, just weakness.  She has been oddly confused, atypical for her.  No known h/o dementia.     ED Course: Weak, fell twice yesterday.  Minimal injuries.  Appears to have a UTI causing weakness.  Imaging studies negative.  Review of Systems: As per HPI; otherwise review of systems reviewed and negative.    COVID Vaccine Status:   Complete  Past Medical History:  Diagnosis Date   Allergic rhinitis, cause unspecified    Anxiety state,  unspecified    Asthma    Coronary atherosclerosis of unspecified type of vessel, native or graft    Depression    Female bladder prolapse    Hyperlipidemia    IBS (irritable bowel syndrome)    Osteoarthrosis, unspecified whether generalized or localized, unspecified site    Type II or unspecified type diabetes mellitus without mention of complication, not stated as uncontrolled    Unspecified chronic bronchitis ([place])    Unspecified essential hypertension    Unspecified hearing loss    Unspecified venous (peripheral) insufficiency    Unspecified vitamin D deficiency     Past Surgical History:  Procedure Laterality Date   Cataract surg  2009   CHOLECYSTECTOMY  1997   DILATION AND CURETTAGE OF UTERUS  1960's   Left elbow surgery  1992    Social History   Socioeconomic History   Marital status: Married    Spouse name: Suniyah Mousley   Number of children: 3   Years of education: Not on file   Highest education level: Not on file  Occupational History   Occupation: Retired    Fish farm manager: [employer]  Tobacco Use   Smoking status: Never   Smokeless tobacco: Never  Vaping Use   Vaping Use: Never used  Substance and Sexual Activity   Alcohol use: No    Alcohol/week: 0.0 standard drinks   Drug use:  No   Sexual activity: Never  Other Topics Concern   Not on file  Social History Narrative   Not on file   Social Determinants of Health   Financial Resource Strain: Not on file  Food Insecurity: Not on file  Transportation Needs: Not on file  Physical Activity: Not on file  Stress: Not on file  Social Connections: Not on file  Intimate Partner Violence: Not on file    Allergies  Allergen Reactions   Nifedipine Swelling   Codeine     Unknown per pt    Metformin And Related     N&V   Other Diarrhea    Shelled beans, severe IBS   Penicillins     REACTION: ITCHING AND SWELLING    Family History  Problem Relation Age of Onset   Emphysema Mother     Heart disease Father    Colon cancer Sister    Colon cancer Other    Pancreatic cancer Other     Prior to Admission medications   Medication Sig Start Date End Date Taking? Authorizing Provider  acetaminophen (TYLENOL) 325 MG tablet Take 325 mg by mouth every 6 (six) hours as needed for mild pain or moderate pain. Pain/headache    [provider]  ALPRAZolam Duanne Moron) 1 MG tablet TAKE 1 TABLET BY MOUTH THREE TIMES A DAY AS NEEDED FOR ANXIETY 10/29/20   Janith Lima, MD  atorvastatin (LIPITOR) 40 MG tablet TAKE 1 TABLET BY MOUTH DAILY AT 6 PM. 09/28/20   Janith Lima, MD  BREO ELLIPTA 200-25 MCG/INH AEPB INHALE 1 PUFF BY MOUTH EVERY DAY 01/19/20   Janith Lima, MD  cetirizine (ZYRTEC) 10 MG tablet Take 10 mg by mouth daily.    [provider]  dicyclomine (BENTYL) 10 MG capsule TAKE ONE CAPSULE BY MOUTH 3 TIMES A DAY BEFORE MEALS 07/22/20   Janith Lima, MD  fluticasone [facility]) 50 MCG/ACT nasal spray PLACE 2 SPRAYS INTO BOTH NOSTRILS AT BEDTIME 10/24/19   Janith Lima, MD  furosemide (LASIX) 40 MG tablet TAKE 1 TO 2 TABLETS (40-80 MG TOTAL) BY MOUTH DAILY. Patient taking differently: Take 40-80 mg by mouth daily. 07/13/18   Janith Lima, MD  losartan (COZAAR) 50 MG tablet TAKE 1 TABLET BY MOUTH EVERY DAY 12/30/20   Janith Lima, MD  PROAIR HFA 108 (90 Base) MCG/ACT inhaler INHALE 1 TO 2 PUFFS BY MOUTH EVERY 4 HOURS AS NEEDED FOR WHEEZE OR SHORTNESS OF BREATH Patient taking differently: Inhale 1-2 puffs into the lungs every 4 (four) hours as needed for wheezing or shortness of breath. 06/05/15   Janith Lima, MD  Vitamin D, Ergocalciferol, (DRISDOL) 1.25 MG (50000 UNIT) CAPS capsule TAKE 1 CAPSULE BY MOUTH EVERY 7 DAYS 06/17/20   Janith Lima, MD    Physical Exam: Vitals:   01/09/21 1130 01/09/21 1200 01/09/21 1230 01/09/21 1300  BP: (!) 160/64 (!) 167/63 (!) 157/63 (!) 165/66  Pulse: 66 64 68 69  Resp: '17 15 15 18  '$ Temp:      TempSrc:      SpO2: 98% 99%  98% 98%     General:  Appears calm and comfortable and is in NAD, frail Eyes:  EOMI, normal lids, iris ENT:  Profoundly hard of hearing, grossly normal lips & tongue, mmm Neck:  no LAD, masses or thyromegaly Cardiovascular:  RRR, no m/r/g. No LE edema.  Respiratory:   CTA bilaterally with no wheezes/rales/rhonchi.  Normal respiratory effort.  Abdomen:  soft, NT, ND, no suprapubic TTP Skin:  no rash or induration seen on limited exam Musculoskeletal:  grossly normal tone BUE/BLE, good ROM, no bony abnormality Psychiatric:  grossly normal mood and affect, speech fluent but conversation very limited by hearing impairment Neurologic:  CN 2-12 grossly intact, moves all extremities in coordinated fashion    Radiological Exams on Admission: Independently reviewed - see discussion in A/P where applicable  DG Ribs Unilateral W/Chest Left  Result Date: 01/09/2021 CLINICAL DATA:  Status post fall.  Pain EXAM: LEFT RIBS AND CHEST - 3+ VIEW COMPARISON:  04/06/2019 FINDINGS: No fracture or other bone lesions are seen involving the ribs. There is no evidence of pneumothorax or pleural effusion. Lung volumes are low. There is mild platelike atelectasis in the lung bases. Heart size and mediastinal contours are within normal limits. IMPRESSION: 1. No rib fractures identified. 2. Low lung volumes and bibasilar atelectasis. Electronically Signed   By: Kerby Moors M.D.   On: 01/09/2021 09:49   DG Pelvis 1-2 Views  Result Date: 01/09/2021 CLINICAL DATA:  Fall. EXAM: PELVIS - 1-2 VIEW COMPARISON:  CT abdomen pelvis dated May 27, 2016. FINDINGS: There is no evidence of pelvic fracture or diastasis. No pelvic bone lesions are seen. IMPRESSION: Negative. Electronically Signed   By: Titus Dubin M.D.   On: 01/09/2021 09:48   CT Head Wo Contrast  Result Date: 01/09/2021 CLINICAL DATA:  Fall. EXAM: CT HEAD WITHOUT CONTRAST CT CERVICAL SPINE WITHOUT CONTRAST TECHNIQUE: Multidetector CT imaging of the head  and cervical spine was performed following the standard protocol without intravenous contrast. Multiplanar CT image reconstructions of the cervical spine were also generated. COMPARISON:  CT head dated May 27, 2016. FINDINGS: CT HEAD FINDINGS Brain: No evidence of acute infarction, hemorrhage, hydrocephalus, extra-axial collection or mass lesion/mass effect. Stable mild atrophy and chronic microvascular ischemic changes. Vascular: Atherosclerotic vascular calcification of the carotid siphons. No hyperdense vessel. Skull: Normal. Negative for fracture or focal lesion. Sinuses/Orbits: No acute finding. Other: None. CT CERVICAL SPINE FINDINGS Alignment: Normal. Skull base and vertebrae: No acute fracture. No primary bone lesion or focal pathologic process. Soft tissues and spinal canal: No prevertebral fluid or swelling. No visible canal hematoma. Disc levels: Small disc protrusions at C4-C5 and C5-C6. Mild bilateral uncovertebral hypertrophy at C5-C6. Upper chest: Negative. Other: None. IMPRESSION: 1. No acute intracranial abnormality. 2. No acute cervical spine fracture or traumatic listhesis. Electronically Signed   By: Titus Dubin M.D.   On: 01/09/2021 09:24   CT Cervical Spine Wo Contrast  Result Date: 01/09/2021 CLINICAL DATA:  Fall. EXAM: CT HEAD WITHOUT CONTRAST CT CERVICAL SPINE WITHOUT CONTRAST TECHNIQUE: Multidetector CT imaging of the head and cervical spine was performed following the standard protocol without intravenous contrast. Multiplanar CT image reconstructions of the cervical spine were also generated. COMPARISON:  CT head dated May 27, 2016. FINDINGS: CT HEAD FINDINGS Brain: No evidence of acute infarction, hemorrhage, hydrocephalus, extra-axial collection or mass lesion/mass effect. Stable mild atrophy and chronic microvascular ischemic changes. Vascular: Atherosclerotic vascular calcification of the carotid siphons. No hyperdense vessel. Skull: Normal. Negative for fracture or  focal lesion. Sinuses/Orbits: No acute finding. Other: None. CT CERVICAL SPINE FINDINGS Alignment: Normal. Skull base and vertebrae: No acute fracture. No primary bone lesion or focal pathologic process. Soft tissues and spinal canal: No prevertebral fluid or swelling. No visible canal hematoma. Disc levels: Small disc protrusions at C4-C5 and C5-C6. Mild bilateral uncovertebral hypertrophy at C5-C6. Upper chest: Negative. Other: None.  IMPRESSION: 1. No acute intracranial abnormality. 2. No acute cervical spine fracture or traumatic listhesis. Electronically Signed   By: Titus Dubin M.D.   On: 01/09/2021 09:24    EKG: Independently reviewed.   2157 - NSR with rate 68; no evidence of acute ischemia 0759 - NSR with rate 74; prolonged QTc 575; no evidence of acute ischemia   Labs on Admission: I have personally reviewed the available labs and imaging studies at the time of the admission.  Pertinent labs:   Unremarkable BMP HS troponin 5, 7 WBC 7.3 Hgb 11.8 UA: small Hgb, large LE, +nitrites, many bacteria, >50 WBC   Assessment/Plan Principal Problem:   Fall at home, initial encounter Active Problems:   Type II diabetes mellitus with manifestations ([place])   Hyperlipidemia with target LDL less than 100   Essential hypertension   Failure to thrive in adult   Acute lower UTI   Fall -Patient with recurrent falls at home yesterday -No apparent injuries, imaging negative -Will observe overnight  -Current likely diagnosis is FTT plus UTI  Failure to thrive -Patient is the caregiver for her husband at home and generally independent but has been getting increasingly weak and then had multiple falls yesterday -She also has been seeing her PCP for unintentional weight loss -Current acute on chronic issue may be associated with UTI (see below), but she also appears to have a tremendous amount of social pressures and FTT and may benefit from [facility] assistance or even ILF/ALF for both she and her  husband -Will observe with PT/OT/ST/nutrition/TOC team evaluations -Will request palliative care evaluation for goals of care  -Family denies h/o dementia but PCP appears to have diagnosed this in 10/2018 -Her PCP also recently started Xanax in case there is an anxiety component  UTI -h/o recurrent UTI, predisposed due to her severe prolapse and pessary -Will continue treatment with Rocephin daily for now -Urine culture is pending  HLD -Continue Lipitor  DM -Diet controlled with last A1c 5.7 -Will not monitor while in the hospital unless fasting glucose indicates need  HTN -Continue Cozaar  COPD -No O2 requirement, appears compensated -Continue Breo, Albuterol, Flonase, Zyrtec (Claritin formulary substitution)     Note: This patient has been tested and is negative for the novel coronavirus COVID-19. The patient has been fully vaccinated against COVID-19.   Level of care: Med-Surg DVT prophylaxis:  Lovenox  Code Status:  Full - confirmed with family for now but they desire ongoing discussion Family Communication: Daughter was present throughout evaluation Disposition Plan:  The patient is from: home  Anticipated d/c is to: home with [facility] services  Anticipated d/c date will depend on clinical response to treatment, but possibly as early as tomorrow if she has excellent response to treatment  Patient is currently: acutely ill Consults called: Palliative care; PT/OT/ST/Nutrition/TOC team  Admission status:  It is my clinical opinion that referral for OBSERVATION is reasonable and necessary in this patient based on the above information provided. The aforementioned taken together are felt to place the patient at high risk for further clinical deterioration. However it is anticipated that the patient may be medically stable for discharge from the hospital within 24 to 48 hours.    Karmen Bongo MD [facility]   How to contact the [facility] Attending or Consulting provider [employer] or covering provider during after hours [employer], for this patient?  Check the care team in [facility] and look for a) attending/consulting [place] provider listed and b)  the [facility] team listed Log into [email or web address] and use [place]'s universal password to access. If you do not have the password, please contact the hospital operator. Locate the [facility] provider you are looking for under [facility] and page to a number that you can be directly reached. If you still have difficulty reaching the provider, please page the [facility] (Director on Call) for the Hospitalists listed on amion for assistance.   01/09/2021, 1:48 PM

## 2021-01-09 NOTE — ED Notes (Signed)
Pt reports fall x 2 yesterday. Normally walks independently at home. Sp fall, pt reports back pain and rib pain to right side. Able to stand up, pivot and transfer to bed. Unsteady gait observed when pt stood up and walked 2-4 steps to bed. Alert and oriented x 4. Will continue to monitor.

## 2021-01-09 NOTE — ED Provider Notes (Signed)
[facility] EMERGENCY DEPARTMENT Provider Note   CSN: [id number] Arrival date & time: 01/08/21  2143     History Chief Complaint  Patient presents with   Fall   Weakness    Nichole Silva is a 86 y.o. female.  86 year old female with prior medical history as detailed below presents for evaluation.  She is accompanied by her daughter.  Patient's daughter provides majority of history.  Patient resides at home with her husband.  She had 2 falls at home.  Both were ground-level.  Patient with significant generalized weakness after the second fall.  Patient has significant hearing loss.  Additional history is difficult to obtain from the patient herself.  The history is provided by the patient and a relative.  Fall This is a new problem. The current episode started yesterday. The problem occurs rarely. The problem has not changed since onset.Pertinent negatives include no chest pain and no headaches. Nothing aggravates the symptoms.  Weakness Associated symptoms: no chest pain and no headaches       Past Medical History:  Diagnosis Date   Allergic rhinitis, cause unspecified    Anxiety state, unspecified    Asthma    Coronary atherosclerosis of unspecified type of vessel, native or graft    Depression    Female bladder prolapse    Hyperlipidemia    IBS (irritable bowel syndrome)    Osteoarthrosis, unspecified whether generalized or localized, unspecified site    Type II or unspecified type diabetes mellitus without mention of complication, not stated as uncontrolled    Unspecified chronic bronchitis ([place])    Unspecified essential hypertension    Unspecified hearing loss    Unspecified venous (peripheral) insufficiency    Unspecified vitamin D deficiency     Patient Active Problem List   Diagnosis Date Noted   Deficiency anemia 07/23/2020   Dementia without behavioral disturbance ([place]) 11/17/2018   Estrogen deficiency 03/08/2017   Age-related  osteoporosis with current pathological fracture 03/08/2017   Eczema 05/28/2016   Asthma, moderate persistent 07/29/2015   IBS (irritable bowel syndrome) 01/30/2015   Pessary maintenance 10/25/2013   Cystocele with uterine prolapse 10/25/2013   Anxiety 10/06/2013   Routine general medical examination at a health care facility 09/14/2013   Vitamin D deficiency 02/25/2009   DEGENERATIVE JOINT DISEASE 02/27/2008   Type II diabetes mellitus with manifestations ([place]) 09/02/2007   Hyperlipidemia with target LDL less than 100 07/05/2007   Essential hypertension 07/05/2007   Coronary atherosclerosis 07/05/2007    Past Surgical History:  Procedure Laterality Date   Cataract surg  2009   CHOLECYSTECTOMY  1997   DILATION AND CURETTAGE OF UTERUS  1960's   Left elbow surgery  1992     OB History     Gravida  6   Para  4   Term  3   Preterm  1   AB  2   Living  4      SAB  2   IAB      Ectopic      Multiple      Live Births  4           Family History  Problem Relation Age of Onset   Emphysema Mother    Heart disease Father    Colon cancer Sister    Colon cancer Other    Pancreatic cancer Other     Social History   Tobacco Use   Smoking status: Never   Smokeless tobacco: Never  Vaping Use   Vaping Use: Never used  Substance Use Topics   Alcohol use: No    Alcohol/week: 0.0 standard drinks   Drug use: No    Home Medications Prior to Admission medications   Medication Sig Start Date End Date Taking? Authorizing Provider  acetaminophen (TYLENOL) 325 MG tablet Take 325 mg by mouth every 6 (six) hours as needed for mild pain or moderate pain. Pain/headache    [provider]  ALPRAZolam Duanne Moron) 1 MG tablet TAKE 1 TABLET BY MOUTH THREE TIMES A DAY AS NEEDED FOR ANXIETY 10/29/20   Janith Lima, MD  atorvastatin (LIPITOR) 40 MG tablet TAKE 1 TABLET BY MOUTH DAILY AT 6 PM. 09/28/20   Janith Lima, MD  BREO ELLIPTA 200-25 MCG/INH AEPB INHALE 1 PUFF  BY MOUTH EVERY DAY 01/19/20   Janith Lima, MD  cetirizine (ZYRTEC) 10 MG tablet Take 10 mg by mouth daily.    [provider]  dicyclomine (BENTYL) 10 MG capsule TAKE ONE CAPSULE BY MOUTH 3 TIMES A DAY BEFORE MEALS 07/22/20   Janith Lima, MD  fluticasone [facility]) 50 MCG/ACT nasal spray PLACE 2 SPRAYS INTO BOTH NOSTRILS AT BEDTIME 10/24/19   Janith Lima, MD  furosemide (LASIX) 40 MG tablet TAKE 1 TO 2 TABLETS (40-80 MG TOTAL) BY MOUTH DAILY. Patient taking differently: Take 40-80 mg by mouth daily. 07/13/18   Janith Lima, MD  losartan (COZAAR) 50 MG tablet TAKE 1 TABLET BY MOUTH EVERY DAY 12/30/20   Janith Lima, MD  PROAIR HFA 108 (90 Base) MCG/ACT inhaler INHALE 1 TO 2 PUFFS BY MOUTH EVERY 4 HOURS AS NEEDED FOR WHEEZE OR SHORTNESS OF BREATH Patient taking differently: Inhale 1-2 puffs into the lungs every 4 (four) hours as needed for wheezing or shortness of breath. 06/05/15   Janith Lima, MD  Vitamin D, Ergocalciferol, (DRISDOL) 1.25 MG (50000 UNIT) CAPS capsule TAKE 1 CAPSULE BY MOUTH EVERY 7 DAYS 06/17/20   Janith Lima, MD    Allergies    Nifedipine, Codeine, Metformin and related, and Penicillins  Review of Systems   Review of Systems  Cardiovascular:  Negative for chest pain.  Neurological:  Positive for weakness. Negative for headaches.  All other systems reviewed and are negative.  Physical Exam Updated Vital Signs BP (!) 169/59   Pulse 65   Temp 98.2 F (36.8 C) (Oral)   Resp 11   SpO2 98%   Physical Exam Vitals and nursing note reviewed.  Constitutional:      General: She is not in acute distress.    Appearance: Normal appearance. She is well-developed.  HENT:     Head: Normocephalic and atraumatic.  Eyes:     Conjunctiva/sclera: Conjunctivae normal.     Pupils: Pupils are equal, round, and reactive to light.  Cardiovascular:     Rate and Rhythm: Normal rate and regular rhythm.     Heart sounds: Normal heart sounds.  Pulmonary:      Effort: Pulmonary effort is normal. No respiratory distress.     Breath sounds: Normal breath sounds.  Abdominal:     General: There is no distension.     Palpations: Abdomen is soft.     Tenderness: There is no abdominal tenderness.  Musculoskeletal:        General: No deformity. Normal range of motion.     Cervical back: Normal range of motion and neck supple.  Skin:    General: Skin is warm and  dry.  Neurological:     General: No focal deficit present.     Mental Status: She is alert. Mental status is at baseline.    ED Results / Procedures / Treatments   Labs (all labs ordered are listed, but only abnormal results are displayed) Labs Reviewed  BASIC METABOLIC PANEL - Abnormal; Notable for the following components:      Result Value   Glucose, Bld 102 (*)    All other components within normal limits  CBC WITH DIFFERENTIAL/PLATELET - Abnormal; Notable for the following components:   RBC 3.70 (*)    Hemoglobin 11.8 (*)    All other components within normal limits  URINALYSIS, ROUTINE W REFLEX MICROSCOPIC - Abnormal; Notable for the following components:   APPearance CLOUDY (*)    Hgb urine dipstick SMALL (*)    Nitrite POSITIVE (*)    Leukocytes,Ua LARGE (*)    WBC, UA >50 (*)    Bacteria, UA MANY (*)    All other components within normal limits  TROPONIN I (HIGH SENSITIVITY)  TROPONIN I (HIGH SENSITIVITY)    EKG EKG Interpretation  Date/Time:  Thursday January 09 2021 07:59:44 EDT Ventricular Rate:  74 PR Interval:  187 QRS Duration: 104 QT Interval:  518 QTC Calculation: 575 R Axis:   -22 Text Interpretation: Sinus rhythm Borderline left axis deviation Low voltage, precordial leads Consider anterior infarct Prolonged QT interval Confirmed by Dene Gentry [phone]) on 01/09/2021 8:03:46 AM  Radiology DG Ribs Unilateral W/Chest Left  Result Date: 01/09/2021 CLINICAL DATA:  Status post fall.  Pain EXAM: LEFT RIBS AND CHEST - 3+ VIEW COMPARISON:  04/06/2019  FINDINGS: No fracture or other bone lesions are seen involving the ribs. There is no evidence of pneumothorax or pleural effusion. Lung volumes are low. There is mild platelike atelectasis in the lung bases. Heart size and mediastinal contours are within normal limits. IMPRESSION: 1. No rib fractures identified. 2. Low lung volumes and bibasilar atelectasis. Electronically Signed   By: Kerby Moors M.D.   On: 01/09/2021 09:49   DG Pelvis 1-2 Views  Result Date: 01/09/2021 CLINICAL DATA:  Fall. EXAM: PELVIS - 1-2 VIEW COMPARISON:  CT abdomen pelvis dated May 27, 2016. FINDINGS: There is no evidence of pelvic fracture or diastasis. No pelvic bone lesions are seen. IMPRESSION: Negative. Electronically Signed   By: Titus Dubin M.D.   On: 01/09/2021 09:48   CT Head Wo Contrast  Result Date: 01/09/2021 CLINICAL DATA:  Fall. EXAM: CT HEAD WITHOUT CONTRAST CT CERVICAL SPINE WITHOUT CONTRAST TECHNIQUE: Multidetector CT imaging of the head and cervical spine was performed following the standard protocol without intravenous contrast. Multiplanar CT image reconstructions of the cervical spine were also generated. COMPARISON:  CT head dated May 27, 2016. FINDINGS: CT HEAD FINDINGS Brain: No evidence of acute infarction, hemorrhage, hydrocephalus, extra-axial collection or mass lesion/mass effect. Stable mild atrophy and chronic microvascular ischemic changes. Vascular: Atherosclerotic vascular calcification of the carotid siphons. No hyperdense vessel. Skull: Normal. Negative for fracture or focal lesion. Sinuses/Orbits: No acute finding. Other: None. CT CERVICAL SPINE FINDINGS Alignment: Normal. Skull base and vertebrae: No acute fracture. No primary bone lesion or focal pathologic process. Soft tissues and spinal canal: No prevertebral fluid or swelling. No visible canal hematoma. Disc levels: Small disc protrusions at C4-C5 and C5-C6. Mild bilateral uncovertebral hypertrophy at C5-C6. Upper chest:  Negative. Other: None. IMPRESSION: 1. No acute intracranial abnormality. 2. No acute cervical spine fracture or traumatic listhesis. Electronically Signed  By: Titus Dubin M.D.   On: 01/09/2021 09:24   CT Cervical Spine Wo Contrast  Result Date: 01/09/2021 CLINICAL DATA:  Fall. EXAM: CT HEAD WITHOUT CONTRAST CT CERVICAL SPINE WITHOUT CONTRAST TECHNIQUE: Multidetector CT imaging of the head and cervical spine was performed following the standard protocol without intravenous contrast. Multiplanar CT image reconstructions of the cervical spine were also generated. COMPARISON:  CT head dated May 27, 2016. FINDINGS: CT HEAD FINDINGS Brain: No evidence of acute infarction, hemorrhage, hydrocephalus, extra-axial collection or mass lesion/mass effect. Stable mild atrophy and chronic microvascular ischemic changes. Vascular: Atherosclerotic vascular calcification of the carotid siphons. No hyperdense vessel. Skull: Normal. Negative for fracture or focal lesion. Sinuses/Orbits: No acute finding. Other: None. CT CERVICAL SPINE FINDINGS Alignment: Normal. Skull base and vertebrae: No acute fracture. No primary bone lesion or focal pathologic process. Soft tissues and spinal canal: No prevertebral fluid or swelling. No visible canal hematoma. Disc levels: Small disc protrusions at C4-C5 and C5-C6. Mild bilateral uncovertebral hypertrophy at C5-C6. Upper chest: Negative. Other: None. IMPRESSION: 1. No acute intracranial abnormality. 2. No acute cervical spine fracture or traumatic listhesis. Electronically Signed   By: Titus Dubin M.D.   On: 01/09/2021 09:24    Procedures Procedures   Medications Ordered in ED Medications  cefTRIAXone (ROCEPHIN) 1 g in sodium chloride 0.9 % 100 mL IVPB (has no administration in time range)    ED Course  I have reviewed the triage vital signs and the nursing notes.  Pertinent labs & imaging results that were available during my care of the patient were reviewed by me  and considered in my medical decision making (see chart for details).    MDM Rules/Calculators/A&P                           MDM  MSE complete  Nichole Silva was evaluated in Emergency Department on 01/09/2021 for the symptoms described in the history of present illness. She was evaluated in the context of the global COVID-19 pandemic, which necessitated consideration that the patient might be at risk for infection with the SARS-CoV-2 virus that causes COVID-19. Institutional protocols and algorithms that pertain to the evaluation of patients at risk for COVID-19 are in a state of rapid change based on information released by regulatory bodies including the CDC and federal and state organizations. These policies and algorithms were followed during the patient's care in the ED.  Patient is presenting after 2 witnessed falls at her home.  Patient without evidence of significant traumatic injury secondary to fall.  Her work-up demonstrates evidence of likely UTI.  I suspect that her UTI may be contributing to patient's reported weakness and falling.  Patient would likely benefit from admission for further work-up and treatment.  Hospitalist service is aware of case and will evaluate for admission.   Final Clinical Impression(s) / ED Diagnoses Final diagnoses:  Fall, initial encounter  Urinary tract infection without hematuria, site unspecified    Rx / DC Orders ED Discharge Orders     None        Valarie Merino, MD 01/09/21 1209

## 2021-01-09 NOTE — ED Notes (Signed)
Pt very anxious and confused. Trying to get staff and family to "lift me to the end of the bed so I can just gt out of here."

## 2021-01-10 DIAGNOSIS — R627 Adult failure to thrive: Secondary | ICD-10-CM | POA: Diagnosis present

## 2021-01-10 DIAGNOSIS — Z719 Counseling, unspecified: Secondary | ICD-10-CM

## 2021-01-10 DIAGNOSIS — F419 Anxiety disorder, unspecified: Secondary | ICD-10-CM | POA: Diagnosis present

## 2021-01-10 DIAGNOSIS — E559 Vitamin D deficiency, unspecified: Secondary | ICD-10-CM | POA: Diagnosis present

## 2021-01-10 DIAGNOSIS — Z825 Family history of asthma and other chronic lower respiratory diseases: Secondary | ICD-10-CM | POA: Diagnosis not present

## 2021-01-10 DIAGNOSIS — E119 Type 2 diabetes mellitus without complications: Secondary | ICD-10-CM | POA: Diagnosis present

## 2021-01-10 DIAGNOSIS — R410 Disorientation, unspecified: Secondary | ICD-10-CM | POA: Diagnosis not present

## 2021-01-10 DIAGNOSIS — R4182 Altered mental status, unspecified: Secondary | ICD-10-CM | POA: Diagnosis present

## 2021-01-10 DIAGNOSIS — Z800 Family history of malignant neoplasm of digestive organs: Secondary | ICD-10-CM | POA: Diagnosis not present

## 2021-01-10 DIAGNOSIS — R296 Repeated falls: Secondary | ICD-10-CM | POA: Diagnosis present

## 2021-01-10 DIAGNOSIS — I2510 Atherosclerotic heart disease of native coronary artery without angina pectoris: Secondary | ICD-10-CM | POA: Diagnosis present

## 2021-01-10 DIAGNOSIS — W1830XA Fall on same level, unspecified, initial encounter: Secondary | ICD-10-CM | POA: Diagnosis present

## 2021-01-10 DIAGNOSIS — Z8249 Family history of ischemic heart disease and other diseases of the circulatory system: Secondary | ICD-10-CM | POA: Diagnosis not present

## 2021-01-10 DIAGNOSIS — E785 Hyperlipidemia, unspecified: Secondary | ICD-10-CM | POA: Diagnosis present

## 2021-01-10 DIAGNOSIS — E860 Dehydration: Secondary | ICD-10-CM | POA: Diagnosis present

## 2021-01-10 DIAGNOSIS — J449 Chronic obstructive pulmonary disease, unspecified: Secondary | ICD-10-CM | POA: Diagnosis present

## 2021-01-10 DIAGNOSIS — Z7189 Other specified counseling: Secondary | ICD-10-CM

## 2021-01-10 DIAGNOSIS — G9341 Metabolic encephalopathy: Secondary | ICD-10-CM | POA: Diagnosis present

## 2021-01-10 DIAGNOSIS — F32A Depression, unspecified: Secondary | ICD-10-CM | POA: Diagnosis present

## 2021-01-10 DIAGNOSIS — H9190 Unspecified hearing loss, unspecified ear: Secondary | ICD-10-CM | POA: Diagnosis present

## 2021-01-10 DIAGNOSIS — B961 Klebsiella pneumoniae [K. pneumoniae] as the cause of diseases classified elsewhere: Secondary | ICD-10-CM | POA: Diagnosis present

## 2021-01-10 DIAGNOSIS — E440 Moderate protein-calorie malnutrition: Secondary | ICD-10-CM | POA: Diagnosis present

## 2021-01-10 DIAGNOSIS — R54 Age-related physical debility: Secondary | ICD-10-CM | POA: Diagnosis present

## 2021-01-10 DIAGNOSIS — M1990 Unspecified osteoarthritis, unspecified site: Secondary | ICD-10-CM | POA: Diagnosis present

## 2021-01-10 DIAGNOSIS — K580 Irritable bowel syndrome with diarrhea: Secondary | ICD-10-CM | POA: Diagnosis present

## 2021-01-10 DIAGNOSIS — W19XXXA Unspecified fall, initial encounter: Secondary | ICD-10-CM | POA: Diagnosis not present

## 2021-01-10 DIAGNOSIS — Z515 Encounter for palliative care: Secondary | ICD-10-CM

## 2021-01-10 DIAGNOSIS — I10 Essential (primary) hypertension: Secondary | ICD-10-CM | POA: Diagnosis present

## 2021-01-10 DIAGNOSIS — N390 Urinary tract infection, site not specified: Secondary | ICD-10-CM | POA: Diagnosis present

## 2021-01-10 DIAGNOSIS — Z20822 Contact with and (suspected) exposure to covid-19: Secondary | ICD-10-CM | POA: Diagnosis present

## 2021-01-10 DIAGNOSIS — Z789 Other specified health status: Secondary | ICD-10-CM

## 2021-01-10 DIAGNOSIS — N8110 Cystocele, unspecified: Secondary | ICD-10-CM | POA: Diagnosis present

## 2021-01-10 DIAGNOSIS — Y92009 Unspecified place in unspecified non-institutional (private) residence as the place of occurrence of the external cause: Secondary | ICD-10-CM | POA: Diagnosis not present

## 2021-01-10 LAB — CBC
HCT: 38.8 % (ref 36.0–46.0)
Hemoglobin: 12.8 g/dL (ref 12.0–15.0)
MCH: 31.4 pg (ref 26.0–34.0)
MCHC: 33 g/dL (ref 30.0–36.0)
MCV: 95.3 fL (ref 80.0–100.0)
Platelets: 228 10*3/uL (ref 150–400)
RBC: 4.07 MIL/uL (ref 3.87–5.11)
RDW: 12.9 % (ref 11.5–15.5)
WBC: 9.8 10*3/uL (ref 4.0–10.5)
nRBC: 0 % (ref 0.0–0.2)

## 2021-01-10 LAB — BASIC METABOLIC PANEL
Anion gap: 9 (ref 5–15)
BUN: 6 mg/dL — ABNORMAL LOW (ref 8–23)
CO2: 27 mmol/L (ref 22–32)
Calcium: 9.3 mg/dL (ref 8.9–10.3)
Chloride: 101 mmol/L (ref 98–111)
Creatinine, Ser: 0.72 mg/dL (ref 0.44–1.00)
GFR, Estimated: >60 mL/min (ref >60)
Glucose, Bld: 153 mg/dL — ABNORMAL HIGH (ref 70–99)
Potassium: 3.3 mmol/L — ABNORMAL LOW (ref 3.5–5.1)
Sodium: 137 mmol/L (ref 135–145)

## 2021-01-10 LAB — AMMONIA: Ammonia: 40 umol/L — ABNORMAL HIGH (ref 9–35)

## 2021-01-10 LAB — TSH: TSH: 1.097 u[IU]/mL (ref 0.350–4.500)

## 2021-01-10 MED ORDER — POTASSIUM CHLORIDE CRYS ER 20 MEQ PO TBCR
40.0000 meq | EXTENDED_RELEASE_TABLET | Freq: Once | ORAL | Status: AC
  Administered 2021-01-10: 40 meq via ORAL
  Filled 2021-01-10: qty 2

## 2021-01-10 MED ORDER — ENSURE ENLIVE PO LIQD
237.0000 mL | Freq: Three times a day (TID) | ORAL | Status: DC
  Administered 2021-01-10 – 2021-01-12 (×5): 237 mL via ORAL
  Filled 2021-01-10: qty 237

## 2021-01-10 MED ORDER — POTASSIUM CHLORIDE 20 MEQ PO PACK
40.0000 meq | PACK | Freq: Once | ORAL | Status: AC
  Administered 2021-01-10: 40 meq via ORAL
  Filled 2021-01-10: qty 2

## 2021-01-10 MED ORDER — MORPHINE SULFATE (PF) 2 MG/ML IV SOLN: 1.0000 mg | INTRAVENOUS | Status: DC | PRN

## 2021-01-10 MED ORDER — HYDRALAZINE HCL 20 MG/ML IJ SOLN: 10.0000 mg | INTRAMUSCULAR | Status: DC | PRN

## 2021-01-10 MED ORDER — METOPROLOL TARTRATE 5 MG/5ML IV SOLN: 5.0000 mg | INTRAVENOUS | Status: DC | PRN

## 2021-01-10 MED ORDER — LACTULOSE 10 GM/15ML PO SOLN
20.0000 g | Freq: Three times a day (TID) | ORAL | Status: AC
  Administered 2021-01-10 – 2021-01-11 (×3): 20 g via ORAL
  Filled 2021-01-10 (×3): qty 30

## 2021-01-10 MED ORDER — ADULT MULTIVITAMIN W/MINERALS CH
1.0000 | ORAL_TABLET | Freq: Every day | ORAL | Status: DC
  Administered 2021-01-11 – 2021-01-12 (×2): 1 via ORAL
  Filled 2021-01-10 (×3): qty 1

## 2021-01-10 MED ORDER — IPRATROPIUM-ALBUTEROL 0.5-2.5 (3) MG/3ML IN SOLN: 3.0000 mL | RESPIRATORY_TRACT | Status: DC | PRN

## 2021-01-10 MED ORDER — DM-GUAIFENESIN ER 30-600 MG PO TB12: 1.0000 | ORAL_TABLET | Freq: Two times a day (BID) | ORAL | Status: DC | PRN

## 2021-01-10 NOTE — ED Notes (Signed)
Pt laying in bed and has slid herself all the way to the bottom. PT sheets, brief and pure wick changed. PT then covered back up and placed back on monitor

## 2021-01-10 NOTE — Progress Notes (Signed)
OT Cancellation Note  Patient Details Name: Nichole Silva MRN: [id number] DOB: 1934/05/28   Cancelled Treatment:    Reason Eval/Treat Not Completed: Fatigue/lethargy limiting ability to participate. MD requesting hold eval until next day.   Malka So 01/10/2021, 11:58 AM Nestor Lewandowsky, OTR/L Acute Rehabilitation Services Pager: [phone] Office: [phone]

## 2021-01-10 NOTE — Progress Notes (Signed)
Going to trial telesitter for patient. OK per Dr.Amin. If unsuccessful, leadership on the unit has approved 1 family member to stay overnight (if willing).

## 2021-01-10 NOTE — Progress Notes (Signed)
PROGRESS NOTE    Nichole Silva  [id number] DOB: 1935/04/01 DOA: 01/08/2021 PCP: Janith Lima, MD   Brief Narrative:   86 y.o. female with medical history significant of CAD; HLD; DM; vaginal prolapse with pessary; and HTN presenting with a fall.  Upon admission CT head and cervical spine was negative, UA was suggestive of urinary tract infection empirically started on IV Rocephin.   Assessment & Plan:   Principal Problem:   Fall at home, initial encounter Active Problems:   Type II diabetes mellitus with manifestations ([place])   Hyperlipidemia with target LDL less than 100   Essential hypertension   Failure to thrive in adult   Acute lower UTI    Fall -Likely from underlying infection and dehydration.  CT head and cervical spine are negative.   Failure to thrive, adult -Infection and age.  Will make her diet more liberal to regular.  Dietitian following.  Encourage oral intake   UTI Acute mild encephalopathy -Has pessary in place for recurrent prolapse.  Empiric IV Rocephin.  Follow-up culture data. - Discontinue Xanax   HLD -Continue Lipitor   DM type II.  Diet controlled -Hemoglobin A1c 5.7   HTN -Continue Cozaar   COPD -No O2 requirement, appears compensated -Continue Breo, Albuterol, Flonase, Zyrtec ([employer] formulary substitution)       DVT prophylaxis: enoxaparin (LOVENOX) injection 40 mg Start: 01/09/21 1330  Code Status: Full code Family Communication: Daughter at bedside   Dispo: The patient is from: Home              Anticipated d/c is to: Home              Patient currently is not medically stable to d/c.  Patient is still lethargic requiring IV fluids and IV antibiotics.  Patient is not safe for discharge at this time.   Difficult to place patient No       Nutritional status  Nutrition Problem: Moderate Malnutrition Etiology: chronic illness (CAD)  Signs/Symptoms: mild fat depletion, moderate fat depletion, mild muscle depletion,  moderate muscle depletion  Interventions: Ensure Enlive (each supplement provides 350kcal and 20 grams of protein), MVI, Liberalize Diet  There is no height or weight on file to calculate BMI.           Subjective: Patient seen and examined at bedside, overall very lethargic and weak.  Daughter is present at bedside who tells me she appears slightly better than yesterday but still overall appears to be very weak  Review of Systems Otherwise negative except as per HPI, including: General: Denies fever, chills, night sweats or unintended weight loss. Resp: Denies cough, wheezing, shortness of breath. Cardiac: Denies chest pain, palpitations, orthopnea, paroxysmal nocturnal dyspnea. GI: Denies abdominal pain, nausea, vomiting, diarrhea or constipation GU: Denies dysuria, frequency, hesitancy or incontinence MS: Denies muscle aches, joint pain or swelling Neuro: Denies headache, neurologic deficits (focal weakness, numbness, tingling), abnormal gait Psych: Denies anxiety, depression, SI/HI/AVH Skin: Denies new rashes or lesions ID: Denies sick contacts, exotic exposures, travel  Examination:  General exam: Appears comfortable although quite weak Respiratory system: Diminished breath sounds at the bases Cardiovascular system: S1 & S2 heard, RRR. No JVD, murmurs, rubs, gallops or clicks. No pedal edema. Gastrointestinal system: Abdomen is nondistended, soft and nontender. No organomegaly or masses felt. Normal bowel sounds heard. Central nervous system: Alert and oriented. No focal neurological deficits. Extremities: Symmetric 5 x 5 power. Skin: No rashes, lesions or ulcers Psychiatry: Judgement and insight appear normal. Mood &  affect appropriate.     Objective: Vitals:   01/10/21 0203 01/10/21 0305 01/10/21 0800 01/10/21 1206  BP: (!) 171/89 (!) 168/74 (!) 153/66 (!) 147/59  Pulse: 93 92    Resp: (!) 22 18    Temp: 99.1 F (37.3 C) 98.7 F (37.1 C) 98.4 F (36.9 C)  98.1 F (36.7 C)  TempSrc: Oral Oral Axillary Axillary  SpO2: 97% 95%      Intake/Output Summary (Last 24 hours) at 01/10/2021 1349 Last data filed at 01/10/2021 0805 Gross per 24 hour  Intake 120 ml  Output --  Net 120 ml   There were no vitals filed for this visit.   Data Reviewed:   CBC: Recent Labs  Lab 01/08/21 2229 01/10/21 0114  WBC 7.3 9.8  NEUTROABS 4.5  --   HGB 11.8* 12.8  HCT 36.3 38.8  MCV 98.1 95.3  PLT 217 [id number]   Basic Metabolic Panel: Recent Labs  Lab 01/08/21 2229 01/10/21 0114  NA 139 137  K 3.5 3.3*  CL 104 101  CO2 30 27  GLUCOSE 102* 153*  BUN 10 6*  CREATININE 0.81 0.72  CALCIUM 9.1 9.3   GFR: CrCl cannot be calculated (Unknown ideal weight.). Liver Function Tests: No results for input(s): AST, ALT, ALKPHOS, BILITOT, PROT, ALBUMIN in the last 168 hours. No results for input(s): LIPASE, AMYLASE in the last 168 hours. No results for input(s): AMMONIA in the last 168 hours. Coagulation Profile: No results for input(s): INR, PROTIME in the last 168 hours. Cardiac Enzymes: No results for input(s): CKTOTAL, CKMB, CKMBINDEX, TROPONINI in the last 168 hours. BNP (last 3 results) No results for input(s): PROBNP in the last 8760 hours. HbA1C: No results for input(s): HGBA1C in the last 72 hours. CBG: No results for input(s): GLUCAP in the last 168 hours. Lipid Profile: No results for input(s): CHOL, HDL, LDLCALC, TRIG, CHOLHDL, LDLDIRECT in the last 72 hours. Thyroid Function Tests: Recent Labs    01/10/21 0922  TSH 1.097   Anemia Panel: No results for input(s): VITAMINB12, FOLATE, FERRITIN, TIBC, IRON, RETICCTPCT in the last 72 hours. Sepsis Labs: No results for input(s): PROCALCITON, LATICACIDVEN in the last 168 hours.  Recent Results (from the past 240 hour(s))  Resp Panel by RT-PCR (Flu A&B, Covid) Nasopharyngeal Swab     Status: None   Collection Time: 01/09/21 11:46 AM   Specimen: Nasopharyngeal Swab; Nasopharyngeal(NP) swabs  in vial transport medium  Result Value Ref Range Status   SARS Coronavirus 2 by RT PCR NEGATIVE NEGATIVE Final    Comment: (NOTE) SARS-CoV-2 target nucleic acids are NOT DETECTED.  The SARS-CoV-2 RNA is generally detectable in upper respiratory specimens during the acute phase of infection. The lowest concentration of SARS-CoV-2 viral copies this assay can detect is 138 copies/mL. A negative result does not preclude SARS-Cov-2 infection and should not be used as the sole basis for treatment or other patient management decisions. A negative result may occur with  improper specimen collection/handling, submission of specimen other than nasopharyngeal swab, presence of viral mutation(s) within the areas targeted by this assay, and inadequate number of viral copies(<138 copies/mL). A negative result must be combined with clinical observations, patient history, and epidemiological information. The expected result is Negative.  Fact Sheet for Patients:  EntrepreneurPulse.com.au  Fact Sheet for Healthcare Providers:  IncredibleEmployment.be  This test is no t yet approved or cleared by the [place] FDA and  has been authorized for detection and/or diagnosis of SARS-CoV-2 by FDA under  an Emergency Use Authorization (EUA). This EUA will remain  in effect (meaning this test can be used) for the duration of the COVID-19 declaration under Section 564(b)(1) of the Act, 21 U.S.C.section 360bbb-3(b)(1), unless the authorization is terminated  or revoked sooner.       Influenza A by PCR NEGATIVE NEGATIVE Final   Influenza B by PCR NEGATIVE NEGATIVE Final    Comment: (NOTE) The Xpert Xpress SARS-CoV-2/FLU/RSV plus assay is intended as an aid in the diagnosis of influenza from Nasopharyngeal swab specimens and should not be used as a sole basis for treatment. Nasal washings and aspirates are unacceptable for Xpert Xpress  SARS-CoV-2/FLU/RSV testing.  Fact Sheet for Patients: EntrepreneurPulse.com.au  Fact Sheet for Healthcare Providers: IncredibleEmployment.be  This test is not yet approved or cleared by the [place] FDA and has been authorized for detection and/or diagnosis of SARS-CoV-2 by FDA under an Emergency Use Authorization (EUA). This EUA will remain in effect (meaning this test can be used) for the duration of the COVID-19 declaration under Section 564(b)(1) of the Act, 21 U.S.C. section 360bbb-3(b)(1), unless the authorization is terminated or revoked.  Performed at [facility], [place] [address], [place], [place] [address]          Radiology Studies: DG Ribs Unilateral W/Chest Left  Result Date: 01/09/2021 CLINICAL DATA:  Status post fall.  Pain EXAM: LEFT RIBS AND CHEST - 3+ VIEW COMPARISON:  04/06/2019 FINDINGS: No fracture or other bone lesions are seen involving the ribs. There is no evidence of pneumothorax or pleural effusion. Lung volumes are low. There is mild platelike atelectasis in the lung bases. Heart size and mediastinal contours are within normal limits. IMPRESSION: 1. No rib fractures identified. 2. Low lung volumes and bibasilar atelectasis. Electronically Signed   By: Kerby Moors M.D.   On: 01/09/2021 09:49   DG Pelvis 1-2 Views  Result Date: 01/09/2021 CLINICAL DATA:  Fall. EXAM: PELVIS - 1-2 VIEW COMPARISON:  CT abdomen pelvis dated May 27, 2016. FINDINGS: There is no evidence of pelvic fracture or diastasis. No pelvic bone lesions are seen. IMPRESSION: Negative. Electronically Signed   By: Titus Dubin M.D.   On: 01/09/2021 09:48   CT Head Wo Contrast  Result Date: 01/09/2021 CLINICAL DATA:  Fall. EXAM: CT HEAD WITHOUT CONTRAST CT CERVICAL SPINE WITHOUT CONTRAST TECHNIQUE: Multidetector CT imaging of the head and cervical spine was performed following the standard protocol without intravenous contrast.  Multiplanar CT image reconstructions of the cervical spine were also generated. COMPARISON:  CT head dated May 27, 2016. FINDINGS: CT HEAD FINDINGS Brain: No evidence of acute infarction, hemorrhage, hydrocephalus, extra-axial collection or mass lesion/mass effect. Stable mild atrophy and chronic microvascular ischemic changes. Vascular: Atherosclerotic vascular calcification of the carotid siphons. No hyperdense vessel. Skull: Normal. Negative for fracture or focal lesion. Sinuses/Orbits: No acute finding. Other: None. CT CERVICAL SPINE FINDINGS Alignment: Normal. Skull base and vertebrae: No acute fracture. No primary bone lesion or focal pathologic process. Soft tissues and spinal canal: No prevertebral fluid or swelling. No visible canal hematoma. Disc levels: Small disc protrusions at C4-C5 and C5-C6. Mild bilateral uncovertebral hypertrophy at C5-C6. Upper chest: Negative. Other: None. IMPRESSION: 1. No acute intracranial abnormality. 2. No acute cervical spine fracture or traumatic listhesis. Electronically Signed   By: Titus Dubin M.D.   On: 01/09/2021 09:24   CT Cervical Spine Wo Contrast  Result Date: 01/09/2021 CLINICAL DATA:  Fall. EXAM: CT HEAD WITHOUT CONTRAST CT CERVICAL SPINE WITHOUT CONTRAST  TECHNIQUE: Multidetector CT imaging of the head and cervical spine was performed following the standard protocol without intravenous contrast. Multiplanar CT image reconstructions of the cervical spine were also generated. COMPARISON:  CT head dated May 27, 2016. FINDINGS: CT HEAD FINDINGS Brain: No evidence of acute infarction, hemorrhage, hydrocephalus, extra-axial collection or mass lesion/mass effect. Stable mild atrophy and chronic microvascular ischemic changes. Vascular: Atherosclerotic vascular calcification of the carotid siphons. No hyperdense vessel. Skull: Normal. Negative for fracture or focal lesion. Sinuses/Orbits: No acute finding. Other: None. CT CERVICAL SPINE FINDINGS Alignment:  Normal. Skull base and vertebrae: No acute fracture. No primary bone lesion or focal pathologic process. Soft tissues and spinal canal: No prevertebral fluid or swelling. No visible canal hematoma. Disc levels: Small disc protrusions at C4-C5 and C5-C6. Mild bilateral uncovertebral hypertrophy at C5-C6. Upper chest: Negative. Other: None. IMPRESSION: 1. No acute intracranial abnormality. 2. No acute cervical spine fracture or traumatic listhesis. Electronically Signed   By: Titus Dubin M.D.   On: 01/09/2021 09:24        Scheduled Meds:  atorvastatin  40 mg Oral QPM   dicyclomine  10 mg Oral TID AC   docusate sodium  100 mg Oral BID   enoxaparin (LOVENOX) injection  40 mg Subcutaneous Q24H   feeding supplement  237 mL Oral TID BM   fluticasone furoate-vilanterol  1 puff Inhalation Daily   loratadine  10 mg Oral Daily   losartan  50 mg Oral Daily   multivitamin with minerals  1 tablet Oral Daily   potassium chloride  40 mEq Oral Once   sodium chloride flush  3 mL Intravenous Q12H   Continuous Infusions:  cefTRIAXone (ROCEPHIN)  IV 1 g (01/10/21 1030)     LOS: 0 days   Time spent= 35 mins    Preston Weill Arsenio Loader, MD [facility]  If 7PM-7AM, please contact night-coverage  01/10/2021, 1:49 PM

## 2021-01-10 NOTE — ED Notes (Signed)
Pt brief changed.  

## 2021-01-10 NOTE — Progress Notes (Signed)
SLP Cancellation Note  Patient Details Name: Bethel Kinoshita MRN: [id number] DOB: [birth year]   Cancelled treatment:       Reason Eval/Treat Not Completed: Fatigue/lethargy limiting ability to participate; MD requesting evaluation held until next date.   Elvina Sidle, M.S., CCC-SLP 01/10/2021, 1:55 PM

## 2021-01-10 NOTE — Consult Note (Signed)
Consultation Note Date: 01/10/2021   Patient Name: Nichole Silva  DOB: Nov 13, 1934  MRN: [id number]  Age / Sex: 86 y.o., female  PCP: Janith Lima, MD Referring Physician: Damita Lack, MD  Reason for Consultation: Establishing goals of care  HPI/Patient Profile: 86 y.o. female  with past medical history of COPD, CAD; HLD; DM; vaginal prolapse with pessary; and HTN presented to the ED on 01/08/21 from home after multiple falls and generalized weakness (normally independent). CT head and cervical spine was negative, UA was suggestive of urinary tract infection empirically started on IV Rocephin. Patient was admitted on 01/08/2021 with fall, UTI, failure to thrive, acute mild metabolic encephalopathy.   Clinical Assessment and Goals of Care: I have reviewed medical records including EPIC notes, labs, and imaging. Received report from primary RN - no acute concerns. RN reports patient remains confused with minimal PO intake. Patient has been too lethargic to work with OT/PT per notes.  Went to visit patient at bedside - two daughters, Nichole Silva and Nichole Silva. Patient was lying in bed awake, alert, oriented to self and  time only. She is able to participate in simple conversation but is not able to make complex medical decisions. Note that she is HOH. No signs or non-verbal gestures of pain or discomfort noted. No respiratory distress, increased work of breathing, or secretions noted. Patient denies pain or shortness of breath, states she "feels pretty good today."  Met with patient, daughter/Nichole Silva, and daughter/Nichole Silva  to discuss diagnosis, prognosis, GOC, EOL wishes, disposition, and options.  I introduced Palliative Medicine as specialized medical care for people living with serious illness. It focuses on providing relief from the symptoms and stress of a serious illness. The goal is to improve quality of life  for both the patient and the family.  We discussed a brief life review of the patient as well as functional and nutritional status. 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 Mrs. Cifelli has been married to her husband for 26 years and they had 4 children together, 3 daughters living and 1 son whom has passed. Prior to hospitalization, patient was living in a private residence with her husband and daughter/Nichole Silva. Nichole Silva tells me that Nevin Bloodgood has a mild mental disability - Nichole Silva and Stanton Kidney tell me that all three "take care of each other." Mrs. Polson is the primary caregiver for her husband who is in ill health. Family are concerned she is not able to continue caring for him in the way he needs. They are working with the [place] to try and use his benefits to get private caregivers in the home to assist him and the patient - they have questions and request to speak with TOC while here - informed I would place consult. Prior to hospitalization, patient was independent with all ADLs and did not need assistance walking with cane or walker. Patient had no HH services. Family report she had a good  appetite until this week, when they first started noticing a decline in the patient. Their goal is to keep the patient at home with her husband as long as possible. They are also working on getting the bathroom in the house remodeled to be more "elderly friendly" and are looking into [employer] to help them. We discussed that patient and husband may need additional assistance and ALF may be beneficial.   We discussed patient's current illness and what it means in the larger context of patient's on-going co-morbidities. Family understands that COPD is a progressive, non-curable disease underlying the patient's current acute medical conditions. Natural disease trajectory and expectations at EOL were  discussed. I attempted to elicit values and goals of care important to the patient. The difference between aggressive medical intervention and comfort care was considered in light of the patient's goals of care. Family's goal is to continue antibiotics for UTI as they have already seen a slight improvement in the patient's mental status and alertness since yesterday. At discharge, they are hopeful the patient can return home. We discussed the possibility PT/OT may recommend additional physical therapy - discussed rehab vs [place] services. Family would prefer the patient return home with [facility] - if needed, they could provide the 24/7 assistance/supervision for her to return home.  Hospice and Palliative Care services outpatient were explained and offered. Family are familiar with hospice but request PC education. Discussion on the difference between Palliative and Hospice care. Palliative care and hospice have similar goals of managing symptoms, promoting comfort, improving quality of life, and maintaining a person's dignity. However, palliative care may be offered during any phase of a serious illness, while hospice care is usually offered when a person is expected to live for 6 months or less. Patient is likely not approaching EOL and outpatient Palliative Care was recommended - family were agreeable and request [facility]. They are hopeful the patient and her husband can be enrolled - discussed this process.   Advance directives, concepts specific to code status, artificial feeding and hydration, and rehospitalization were considered and discussed. Family tell me that the patient does not have a Living Will or HCPOA. They explain that they have asked patient before whom she would want to make medical decisions for her if needed, in which her response was her husband. Family explain that her husband would be the ultimate decision maker but they all work together/talk to make decisions. I offered to add his name to  patient's emergency contact, but family explain he is very [facility] and is not able to talk on the phone well. It is ok to call daughters, who can relay information to him if needed.   Encouraged family to consider DNR/DNI status understanding evidenced based poor outcomes in similar hospitalized patient, as the cause of arrest is likely associated with advanced chronic/terminal illness rather than an easily reversible acute cardio-pulmonary event. I explained that DNR/DNI does not change the medical plan and it only comes into effect after a person has arrested (died).  It is a protective measure to keep [place] from harming the patient in their last moments of life. Encouraged family to have this discussion with patient if able, when she becomes more oriented. Family state, in the past the patient has stated she wanted "everything done" to prolong her life. We did discuss that as people's health changes over time and quality of life declines, people often change their minds on what they are willing to go through based on the  quality of life we would bring them back to. Family expressed understanding and requested patient remain full code until they could continue discussion - family would be agreeable to DNR/DNI if this was the patient's wishes. Family were not agreeable to DNR/DNI with understanding that she would receive CPR, defibrillation, ACLS medications, and/or intubation.   Offered chaplain services - family would like patient's priest, Nichole Silva [phone] to visit.   During my visit, family fed patient her dinner - she was accepting of about 30-40%.   Therapeutic listening and emotional support provided throughout visit as family describe how hard it's been for patient to care for her husband.  Discussed with patient/family the importance of continued conversation with each other and the medical providers regarding overall plan of care and treatment options, ensuring decisions are within the  context of the patient's values and GOCs.    Questions and concerns were addressed. The patient/family was encouraged to call with questions and/or concerns. PMT card was provided.    Primary Decision Maker: NEXT OF KIN - husband with assistance from three daughters    SUMMARY OF RECOMMENDATIONS   Continue current medical treatment Continue full code Family's goal is for patient to return home with [facility] if recommended, not rehab. Family could support 24/7 supervision/assistance for patient if needed [facility] consulted for: outpatient Palliative Care referral (requesting [facility]) and family's request for community resources to help obtain caregivers in the home Chaplain consulted: family would like patient's priest to visit, Nichole Silva [phone] PMT will continue to follow peripherally. If there are any imminent needs please call the service directly    Code Status/Advance Care Planning: Full code   Palliative Prophylaxis:  Aspiration, Bowel Regimen, Delirium Protocol, Frequent Pain Assessment, Oral Care, and Turn Reposition  Additional Recommendations (Limitations, Scope, Preferences): Full Scope Treatment  Psycho-social/Spiritual:  Desire for further Chaplaincy support:yes Additional Recommendations: Referral to [employer] space and opportunity for patient and family to express thoughts and feelings regarding patient's current medical situation.  Emotional support and therapeutic listening provided.  Prognosis:  Unable to determine  Discharge Planning: likely home with outpatient Palliative Care to follow     Primary Diagnoses: Present on Admission:  Type II diabetes mellitus with manifestations ([place])  Hyperlipidemia with target LDL less than 100  Essential hypertension  Failure to thrive in adult  Acute lower UTI  AMS (altered mental status)   I have reviewed the medical record, interviewed the patient and family, and examined the patient.  The following aspects are pertinent.  Past Medical History:  Diagnosis Date   Allergic rhinitis, cause unspecified    Anxiety state, unspecified    Asthma    Coronary atherosclerosis of unspecified type of vessel, native or graft    Depression    Female bladder prolapse    Hyperlipidemia    IBS (irritable bowel syndrome)    Osteoarthrosis, unspecified whether generalized or localized, unspecified site    Type II or unspecified type diabetes mellitus without mention of complication, not stated as uncontrolled    Unspecified chronic bronchitis ([place])    Unspecified essential hypertension    Unspecified hearing loss    Unspecified venous (peripheral) insufficiency    Unspecified vitamin D deficiency    Social History   Socioeconomic History   Marital status: Married    Spouse name: Nichole Silva   Number of children: 3   Years of education: Not on file   Highest education level: Not on file  Occupational History   Occupation: Retired    Fish farm manager: [employer]  Tobacco Use   Smoking status: Never   Smokeless tobacco: Never  Vaping Use   Vaping Use: Never used  Substance and Sexual Activity   Alcohol use: No    Alcohol/week: 0.0 standard drinks   Drug use: No   Sexual activity: Never  Other Topics Concern   Not on file  Social History Narrative   Not on file   Social Determinants of Health   Financial Resource Strain: Not on file  Food Insecurity: Not on file  Transportation Needs: Not on file  Physical Activity: Not on file  Stress: Not on file  Social Connections: Not on file   Family History  Problem Relation Age of Onset   Emphysema Mother    Heart disease Father    Colon cancer Sister    Colon cancer Other    Pancreatic cancer Other    Scheduled Meds:  atorvastatin  40 mg Oral QPM   dicyclomine  10 mg Oral TID AC   docusate sodium  100 mg Oral BID   enoxaparin (LOVENOX) injection  40 mg Subcutaneous Q24H   feeding supplement  237 mL Oral  TID BM   fluticasone furoate-vilanterol  1 puff Inhalation Daily   loratadine  10 mg Oral Daily   losartan  50 mg Oral Daily   multivitamin with minerals  1 tablet Oral Daily   potassium chloride  40 mEq Oral Once   sodium chloride flush  3 mL Intravenous Q12H   Continuous Infusions:  cefTRIAXone (ROCEPHIN)  IV 1 g (01/10/21 1030)   PRN Meds:.acetaminophen **OR** acetaminophen, bisacodyl, dextromethorphan-guaiFENesin, fluticasone, hydrALAZINE, HYDROcodone-acetaminophen, ipratropium-albuterol, metoprolol tartrate, morphine injection, ondansetron **OR** ondansetron (ZOFRAN) IV, polyethylene glycol Medications Prior to Admission:  Prior to Admission medications   Medication Sig Start Date End Date Taking? Authorizing Provider  acetaminophen (TYLENOL) 325 MG tablet Take 325 mg by mouth every 6 (six) hours as needed for mild pain or moderate pain. Pain/headache   Yes [provider]  ALPRAZolam (XANAX) 1 MG tablet TAKE 1 TABLET BY MOUTH THREE TIMES A DAY AS NEEDED FOR ANXIETY Patient taking differently: Take 1 mg by mouth 3 (three) times daily as needed for anxiety. 10/29/20  Yes Janith Lima, MD  cetirizine (ZYRTEC) 10 MG tablet Take 10 mg by mouth daily.   Yes [provider]  dicyclomine (BENTYL) 10 MG capsule TAKE ONE CAPSULE BY MOUTH 3 TIMES A DAY BEFORE MEALS Patient taking differently: Take 10 mg by mouth 3 (three) times daily before meals. 07/22/20  Yes Janith Lima, MD  fluticasone (FLONASE) 50 MCG/ACT nasal spray PLACE 2 SPRAYS INTO BOTH NOSTRILS AT BEDTIME Patient taking differently: Place 2 sprays into both nostrils at bedtime as needed for allergies. 10/24/19  Yes Janith Lima, MD  losartan (COZAAR) 50 MG tablet TAKE 1 TABLET BY MOUTH EVERY DAY Patient taking differently: Take 50 mg by mouth daily. 12/30/20  Yes Janith Lima, MD  PROAIR HFA 108 (90 Base) MCG/ACT inhaler INHALE 1 TO 2 PUFFS BY MOUTH EVERY 4 HOURS AS NEEDED FOR WHEEZE OR SHORTNESS OF  BREATH Patient taking differently: Inhale 1-2 puffs into the lungs every 4 (four) hours as needed for wheezing or shortness of breath. 06/05/15  Yes Janith Lima, MD  vitamin B-12 (CYANOCOBALAMIN) 1000 MCG tablet Take 1,000 mcg by mouth daily.   Yes [provider]  Vitamin D, Ergocalciferol, (DRISDOL) 1.25 MG (50000 UNIT) CAPS  capsule TAKE 1 CAPSULE BY MOUTH EVERY 7 DAYS Patient taking differently: Take 50,000 Units by mouth every 7 (seven) days. 06/17/20  Yes Janith Lima, MD  atorvastatin (LIPITOR) 40 MG tablet TAKE 1 TABLET BY MOUTH DAILY AT 6 PM. Patient taking differently: Take 40 mg by mouth daily. 09/28/20   Janith Lima, MD  BREO ELLIPTA 200-25 MCG/INH AEPB INHALE 1 PUFF BY MOUTH EVERY DAY Patient not taking: Reported on 01/09/2021 01/19/20   Janith Lima, MD  furosemide (LASIX) 40 MG tablet TAKE 1 TO 2 TABLETS (40-80 MG TOTAL) BY MOUTH DAILY. Patient not taking: No sig reported 07/13/18   Janith Lima, MD   Allergies  Allergen Reactions   Nifedipine Swelling   Codeine     Unknown per pt    Metformin And Related     N&V   Other Diarrhea    Shelled beans, severe IBS   Penicillins     REACTION: ITCHING AND SWELLING   Review of Systems  Unable to perform ROS: Mental status change   Physical Exam Vitals and nursing note reviewed.  Constitutional:      General: She is not in acute distress.    Appearance: She is ill-appearing.  Pulmonary:     Effort: No respiratory distress.  Skin:    General: Skin is warm and dry.  Neurological:     Mental Status: She is alert. She is disoriented and confused.     Motor: Weakness present.  Psychiatric:        Attention and Perception: She perceives visual hallucinations.        Behavior: Behavior is cooperative.        Cognition and Memory: Cognition is impaired. Memory is impaired.    Vital Signs: BP 117/79 (BP Location: Right Arm)   Pulse (!) 104   Temp 98.6 F (37 C) (Skin)   Resp 20   SpO2 92%  Pain Scale:  0-10   Pain Score: 0-No pain   SpO2: SpO2: 92 % O2 Device:SpO2: 92 % O2 Flow Rate: .   IO: Intake/output summary:  Intake/Output Summary (Last 24 hours) at 01/10/2021 1649 Last data filed at 01/10/2021 0805 Gross per 24 hour  Intake 120 ml  Output --  Net 120 ml    LBM:   Baseline Weight:   Most recent weight:       Palliative Assessment/Data: PPS 40-50%     Time In: 1715 Time Out: 1900 Time Total: 105 minutes  Greater than 50%  of this time was spent counseling and coordinating care related to the above assessment and plan.  Signed by: Lin Landsman, NP   Please contact Palliative Medicine Team phone at [phone] for questions and concerns.  For individual provider: See Shea Evans

## 2021-01-10 NOTE — Progress Notes (Addendum)
PT Cancellation Note  Patient Details Name: Nichole Silva MRN: [id number] DOB: 15-Oct-1934   Cancelled Treatment:    Reason Eval/Treat Not Completed: Fatigue/lethargy limiting ability to participate. Pt sleeping soundly on arrival. Daughter present in room and reports pt barely sleep over last 2 days in ED. Daughter prefers to let pt rest at this time. PT to re-attempt as time allows.  Addendum 1200: Pt remains lethargic/sleepy. Dr. Reesa Chew requesting hold eval until tomorrow.   Lorriane Shire 01/10/2021, 10:37 AM  Lorrin Goodell, PT  Office # [phone] Pager [phone]

## 2021-01-10 NOTE — Progress Notes (Signed)
Initial Nutrition Assessment  DOCUMENTATION CODES:   Non-severe (moderate) malnutrition in context of chronic illness  INTERVENTION:   -Liberalize diet to regular -Ensure Enlive po TID, each supplement provides 350 kcal and 20 grams of protein  -MVI with minerals daily  NUTRITION DIAGNOSIS:   Moderate Malnutrition related to chronic illness (CAD) as evidenced by mild fat depletion, moderate fat depletion, mild muscle depletion, moderate muscle depletion.  GOAL:   Patient will meet greater than or equal to 90% of their needs  MONITOR:   PO intake, Supplement acceptance, Labs, Weight trends, Skin, I & O's  REASON FOR ASSESSMENT:   Consult Assessment of nutrition requirement/status  ASSESSMENT:   Nichole Silva is a 86 y.o. female with medical history significant of CAD; HLD; DM; vaginal prolapse with pessary; and HTN presenting with a fall.   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  She was in the living room and picking up O2 tanks and fell.  Her daughter thinks it was imbalance due to the tanks rather than dizziness/syncope.  Her daughter was able to get her in a sitting position.  She moved eventually into another room and tripped over the oxygen tubing in another room and they could not get her up.  They called 911 and they were able to get her up and brought her in to the ER.  The previous day she was at the [facility] with her husband and walked all day without difficulty, but yesterday she did not seem like herself.  She has significant hearing impairment and this makes it hard for them to know for sure what is happening with her.  She usually manages her own and her husband's affairs but struggles with the oxygen tanks  She has lost about 7 pounds in the last year.  She did have a UTI earlier this year, treated by Dr. Marvel Plan; she is at increased risk for them due to prolapse/pessary.  No obvious urinary symptoms, just weakness.  She has  been oddly confused, atypical for her.  No known h/o dementia.  Pt admitted with fall and FTT.   Reviewed I/O's: -100 ml x 24 hours  Spoke with pt daughter at bedside. Pt is a very selective eater secondary to history of IBS and avoids foods such as beans and onions. Pt has been eating out more recently due to her and her husband being too weak to cook. She usually consumes 3 meals per day (Breakfast: oatmeal; Lunch: yogurt with granola or sandwich; Dinner: meat, starch, and vegetable). Daughter has been ordering meals for pt to decrease cooking and amount of grocery shopping for pt and husband.   Pt daughter voiced concern about inadequate protein in her diet. She shares that pt has experienced a general decline in health over the past year, and has experienced falls, as well as a 7# weight loss over the past year. Daughter notices decreased muscle tone as well.   Discussed importance of good meal and supplement intake to promote healing. Pt is amenable to Ensure supplements.    Medications reviewed and include colace.  Labs reviewed: K: 3.3.   NUTRITION - FOCUSED PHYSICAL EXAM:  Flowsheet Row Most Recent Value  Orbital Region Mild depletion  Upper Arm Region Mild depletion  Thoracic and Lumbar Region No depletion  Buccal Region Mild depletion  Temple Region Moderate depletion  Clavicle Bone Region Mild depletion  Clavicle and Acromion Bone Region Mild depletion  Scapular Bone Region Mild  depletion  Dorsal Hand Moderate depletion  Patellar Region No depletion  Anterior Thigh Region No depletion  Posterior Calf Region No depletion  Edema (RD Assessment) Mild  Hair Reviewed  Eyes Reviewed  Mouth Reviewed  Skin Reviewed  Nails Reviewed       Diet Order:   Diet Order             Diet regular Room service appropriate? No; Fluid consistency: Thin  Diet effective now                   EDUCATION NEEDS:   Education needs have been addressed  Skin:  Skin Assessment:  Reviewed RN Assessment  Last BM:  Unknown  Height:   Ht Readings from Last 1 Encounters:  11/08/20 '5\' 4"'$  (1.626 m)    Weight:   Wt Readings from Last 1 Encounters:  11/08/20 59.1 kg    Ideal Body Weight:  54.5 kg  BMI:  There is no height or weight on file to calculate BMI.  Estimated Nutritional Needs:   Kcal:  1600-1800  Protein:  75-90 grams  Fluid:  >1.6 L    Loistine Chance, RD, LDN, [place] Registered Dietitian II Certified Diabetes Care and Education Specialist Please refer to [facility] for RD and/or RD on-call/weekend/after hours pager

## 2021-01-11 LAB — CBC
HCT: 38.3 % (ref 36.0–46.0)
Hemoglobin: 12.6 g/dL (ref 12.0–15.0)
MCH: 31 pg (ref 26.0–34.0)
MCHC: 32.9 g/dL (ref 30.0–36.0)
MCV: 94.1 fL (ref 80.0–100.0)
Platelets: 217 10*3/uL (ref 150–400)
RBC: 4.07 MIL/uL (ref 3.87–5.11)
RDW: 12.9 % (ref 11.5–15.5)
WBC: 11.3 10*3/uL — ABNORMAL HIGH (ref 4.0–10.5)
nRBC: 0 % (ref 0.0–0.2)

## 2021-01-11 LAB — COMPREHENSIVE METABOLIC PANEL
ALT: 10 U/L (ref 0–44)
AST: 13 U/L — ABNORMAL LOW (ref 15–41)
Albumin: 3.1 g/dL — ABNORMAL LOW (ref 3.5–5.0)
Alkaline Phosphatase: 65 U/L (ref 38–126)
Anion gap: 8 (ref 5–15)
BUN: 11 mg/dL (ref 8–23)
CO2: 25 mmol/L (ref 22–32)
Calcium: 9 mg/dL (ref 8.9–10.3)
Chloride: 100 mmol/L (ref 98–111)
Creatinine, Ser: 0.86 mg/dL (ref 0.44–1.00)
GFR, Estimated: >60 mL/min (ref >60)
Glucose, Bld: 186 mg/dL — ABNORMAL HIGH (ref 70–99)
Potassium: 3.8 mmol/L (ref 3.5–5.1)
Sodium: 133 mmol/L — ABNORMAL LOW (ref 135–145)
Total Bilirubin: 1.1 mg/dL (ref 0.3–1.2)
Total Protein: 6.8 g/dL (ref 6.5–8.1)

## 2021-01-11 LAB — MAGNESIUM: Magnesium: 1.5 mg/dL — ABNORMAL LOW (ref 1.7–2.4)

## 2021-01-11 MED ORDER — MAGNESIUM OXIDE -MG SUPPLEMENT 400 (240 MG) MG PO TABS
800.0000 mg | ORAL_TABLET | ORAL | Status: AC
Start: 2021-01-11 — End: 2021-01-11
  Administered 2021-01-11 (×2): 800 mg via ORAL
  Filled 2021-01-11 (×2): qty 2

## 2021-01-11 MED ORDER — LOPERAMIDE HCL 2 MG PO CAPS
2.0000 mg | ORAL_CAPSULE | Freq: Once | ORAL | Status: AC
  Administered 2021-01-12: 2 mg via ORAL
  Filled 2021-01-11: qty 1

## 2021-01-11 MED ORDER — SODIUM CHLORIDE 0.9 % IV SOLN: INTRAVENOUS | Status: DC

## 2021-01-11 MED ORDER — POTASSIUM CHLORIDE CRYS ER 20 MEQ PO TBCR
20.0000 meq | EXTENDED_RELEASE_TABLET | Freq: Once | ORAL | Status: AC
  Administered 2021-01-11: 20 meq via ORAL
  Filled 2021-01-11: qty 1

## 2021-01-11 NOTE — Progress Notes (Signed)
PROGRESS NOTE    Nichole Silva  [id number] DOB: 30-Nov-1934 DOA: 01/08/2021 PCP: Janith Lima, MD   Brief Narrative:   86 y.o. female with medical history significant of CAD; HLD; DM; vaginal prolapse with pessary; and HTN presenting with a fall.  Upon admission CT head and cervical spine was negative, UA was suggestive of urinary tract infection empirically started on IV Rocephin.   Assessment & Plan:   Principal Problem:   Fall at home, initial encounter Active Problems:   Type II diabetes mellitus with manifestations ([place])   Hyperlipidemia with target LDL less than 100   Essential hypertension   Failure to thrive in adult   Acute lower UTI   AMS (altered mental status)   Malnutrition of moderate degree     UTI Acute mild encephalopathy, improved -Has pessary in place for recurrent prolapse.  Urine cultures positive for Klebsiella.  Empirically on IV Rocephin.  Will await susceptibility to make further adjustment if necessary - Discontinue Xanax -Mildly elevated ammonia, received lactulose -TSH-normal   Failure to thrive, adult -Infection and age.  Seen by dietitian.  Regular diet.   Fall -Likely from underlying infection and dehydration.  CT head and cervical spine are negative.   HLD -Continue Lipitor   DM type II.  Diet controlled -Hemoglobin A1c 5.7   HTN -Continue Cozaar   COPD -No O2 requirement, appears compensated -Continue Breo, Albuterol, Flonase, Zyrtec (Claritin formulary substitution)   PT/OT-pending Speech-pending.    DVT prophylaxis: enoxaparin (LOVENOX) injection 40 mg Start: 01/09/21 1330  Code Status: Full code Family Communication: Daughter is present at bedside   Dispo: The patient is from: Home              Anticipated d/c is to: Home              Patient currently is not medically stable to d/c.  Still feeling quite weak requiring IV fluids.  Awaiting antibiotic susceptibility.  In the meantime PT/OT evaluation is pending    Difficult to place patient No       Nutritional status  Nutrition Problem: Moderate Malnutrition Etiology: chronic illness (CAD)  Signs/Symptoms: mild fat depletion, moderate fat depletion, mild muscle depletion, moderate muscle depletion  Interventions: Ensure Enlive (each supplement provides 350kcal and 20 grams of protein), MVI, Liberalize Diet  There is no height or weight on file to calculate BMI.           Subjective: Patient seen and examined at bedside, no acute events overnight.  She appears to be slightly more alert this morning but overall feels very weak.  Review of Systems Otherwise negative except as per HPI, including: General: Denies fever, chills, night sweats or unintended weight loss. Resp: Denies cough, wheezing, shortness of breath. Cardiac: Denies chest pain, palpitations, orthopnea, paroxysmal nocturnal dyspnea. GI: Denies abdominal pain, nausea, vomiting, diarrhea or constipation GU: Denies dysuria, frequency, hesitancy or incontinence MS: Denies muscle aches, joint pain or swelling Neuro: Denies headache, neurologic deficits (focal weakness, numbness, tingling), abnormal gait Psych: Denies anxiety, depression, SI/HI/AVH Skin: Denies new rashes or lesions ID: Denies sick contacts, exotic exposures, travel  Examination:  Constitutional: Not in acute distress, elderly frail.  Hard of hearing. Respiratory: Clear to auscultation bilaterally Cardiovascular: Normal sinus rhythm, no rubs Abdomen: Nontender nondistended good bowel sounds Musculoskeletal: No edema noted Skin: No rashes seen Neurologic: CN 2-12 grossly intact.  And nonfocal Psychiatric: Normal judgment and insight. Alert and oriented x 3. Normal mood.   Objective: Vitals:  01/10/21 2000 01/10/21 2325 01/11/21 0415 01/11/21 0757  BP: (!) 141/59 (!) 173/73 100/79 134/62  Pulse: 96 98 100 87  Resp: '19 18 20 '$ (!) 23  Temp: 98.8 F (37.1 C) 99.6 F (37.6 C) 98.9 F (37.2 C) 99.2  F (37.3 C)  TempSrc: Oral Oral Oral Oral  SpO2: 93% 92% 93% 95%    Intake/Output Summary (Last 24 hours) at 01/11/2021 0816 Last data filed at 01/11/2021 0025 Gross per 24 hour  Intake 220 ml  Output --  Net 220 ml   There were no vitals filed for this visit.   Data Reviewed:   CBC: Recent Labs  Lab 01/08/21 2229 01/10/21 0114 01/11/21 0105  WBC 7.3 9.8 11.3*  NEUTROABS 4.5  --   --   HGB 11.8* 12.8 12.6  HCT 36.3 38.8 38.3  MCV 98.1 95.3 94.1  PLT 217 228 [id number]   Basic Metabolic Panel: Recent Labs  Lab 01/08/21 2229 01/10/21 0114 01/11/21 0105  NA 139 137 133*  K 3.5 3.3* 3.8  CL 104 101 100  CO2 '30 27 25  '$ GLUCOSE 102* 153* 186*  BUN 10 6* 11  CREATININE 0.81 0.72 0.86  CALCIUM 9.1 9.3 9.0  MG  --   --  1.5*   GFR: CrCl cannot be calculated (Unknown ideal weight.). Liver Function Tests: Recent Labs  Lab 01/11/21 0105  AST 13*  ALT 10  ALKPHOS 65  BILITOT 1.1  PROT 6.8  ALBUMIN 3.1*   No results for input(s): LIPASE, AMYLASE in the last 168 hours. Recent Labs  Lab 01/10/21 1431  AMMONIA 40*   Coagulation Profile: No results for input(s): INR, PROTIME in the last 168 hours. Cardiac Enzymes: No results for input(s): CKTOTAL, CKMB, CKMBINDEX, TROPONINI in the last 168 hours. BNP (last 3 results) No results for input(s): PROBNP in the last 8760 hours. HbA1C: No results for input(s): HGBA1C in the last 72 hours. CBG: No results for input(s): GLUCAP in the last 168 hours. Lipid Profile: No results for input(s): CHOL, HDL, LDLCALC, TRIG, CHOLHDL, LDLDIRECT in the last 72 hours. Thyroid Function Tests: Recent Labs    01/10/21 0922  TSH 1.097   Anemia Panel: No results for input(s): VITAMINB12, FOLATE, FERRITIN, TIBC, IRON, RETICCTPCT in the last 72 hours. Sepsis Labs: No results for input(s): PROCALCITON, LATICACIDVEN in the last 168 hours.  Recent Results (from the past 240 hour(s))  Resp Panel by RT-PCR (Flu A&B, Covid) Nasopharyngeal  Swab     Status: None   Collection Time: 01/09/21 11:46 AM   Specimen: Nasopharyngeal Swab; Nasopharyngeal(NP) swabs in vial transport medium  Result Value Ref Range Status   SARS Coronavirus 2 by RT PCR NEGATIVE NEGATIVE Final    Comment: (NOTE) SARS-CoV-2 target nucleic acids are NOT DETECTED.  The SARS-CoV-2 RNA is generally detectable in upper respiratory specimens during the acute phase of infection. The lowest concentration of SARS-CoV-2 viral copies this assay can detect is 138 copies/mL. A negative result does not preclude SARS-Cov-2 infection and should not be used as the sole basis for treatment or other patient management decisions. A negative result may occur with  improper specimen collection/handling, submission of specimen other than nasopharyngeal swab, presence of viral mutation(s) within the areas targeted by this assay, and inadequate number of viral copies(<138 copies/mL). A negative result must be combined with clinical observations, patient history, and epidemiological information. The expected result is Negative.  Fact Sheet for Patients:  EntrepreneurPulse.com.au  Fact Sheet for Healthcare Providers:  IncredibleEmployment.be  This test is no t yet approved or cleared by the [place] and  has been authorized for detection and/or diagnosis of SARS-CoV-2 by FDA under an Emergency Use Authorization (EUA). This EUA will remain  in effect (meaning this test can be used) for the duration of the COVID-19 declaration under Section 564(b)(1) of the Act, 21 U.S.C.section 360bbb-3(b)(1), unless the authorization is terminated  or revoked sooner.       Influenza A by PCR NEGATIVE NEGATIVE Final   Influenza B by PCR NEGATIVE NEGATIVE Final    Comment: (NOTE) The Xpert Xpress SARS-CoV-2/FLU/RSV plus assay is intended as an aid in the diagnosis of influenza from Nasopharyngeal swab specimens and should not be used as a sole  basis for treatment. Nasal washings and aspirates are unacceptable for Xpert Xpress SARS-CoV-2/FLU/RSV testing.  Fact Sheet for Patients: EntrepreneurPulse.com.au  Fact Sheet for Healthcare Providers: IncredibleEmployment.be  This test is not yet approved or cleared by the [place] FDA and has been authorized for detection and/or diagnosis of SARS-CoV-2 by FDA under an Emergency Use Authorization (EUA). This EUA will remain in effect (meaning this test can be used) for the duration of the COVID-19 declaration under Section 564(b)(1) of the Act, 21 U.S.C. section 360bbb-3(b)(1), unless the authorization is terminated or revoked.  Performed at [facility], [address] [place], [place] [address]          Radiology Studies: DG Ribs Unilateral W/Chest Left  Result Date: 01/09/2021 CLINICAL DATA:  Status post fall.  Pain EXAM: LEFT RIBS AND CHEST - 3+ VIEW COMPARISON:  04/06/2019 FINDINGS: No fracture or other bone lesions are seen involving the ribs. There is no evidence of pneumothorax or pleural effusion. Lung volumes are low. There is mild platelike atelectasis in the lung bases. Heart size and mediastinal contours are within normal limits. IMPRESSION: 1. No rib fractures identified. 2. Low lung volumes and bibasilar atelectasis. Electronically Signed   By: Kerby Moors M.D.   On: 01/09/2021 09:49   DG Pelvis 1-2 Views  Result Date: 01/09/2021 CLINICAL DATA:  Fall. EXAM: PELVIS - 1-2 VIEW COMPARISON:  CT abdomen pelvis dated May 27, 2016. FINDINGS: There is no evidence of pelvic fracture or diastasis. No pelvic bone lesions are seen. IMPRESSION: Negative. Electronically Signed   By: Titus Dubin M.D.   On: 01/09/2021 09:48   CT Head Wo Contrast  Result Date: 01/09/2021 CLINICAL DATA:  Fall. EXAM: CT HEAD WITHOUT CONTRAST CT CERVICAL SPINE WITHOUT CONTRAST TECHNIQUE: Multidetector CT imaging of the head and cervical spine  was performed following the standard protocol without intravenous contrast. Multiplanar CT image reconstructions of the cervical spine were also generated. COMPARISON:  CT head dated May 27, 2016. FINDINGS: CT HEAD FINDINGS Brain: No evidence of acute infarction, hemorrhage, hydrocephalus, extra-axial collection or mass lesion/mass effect. Stable mild atrophy and chronic microvascular ischemic changes. Vascular: Atherosclerotic vascular calcification of the carotid siphons. No hyperdense vessel. Skull: Normal. Negative for fracture or focal lesion. Sinuses/Orbits: No acute finding. Other: None. CT CERVICAL SPINE FINDINGS Alignment: Normal. Skull base and vertebrae: No acute fracture. No primary bone lesion or focal pathologic process. Soft tissues and spinal canal: No prevertebral fluid or swelling. No visible canal hematoma. Disc levels: Small disc protrusions at C4-C5 and C5-C6. Mild bilateral uncovertebral hypertrophy at C5-C6. Upper chest: Negative. Other: None. IMPRESSION: 1. No acute intracranial abnormality. 2. No acute cervical spine fracture or traumatic listhesis. Electronically Signed   By: Orville Govern.D.  On: 01/09/2021 09:24   CT Cervical Spine Wo Contrast  Result Date: 01/09/2021 CLINICAL DATA:  Fall. EXAM: CT HEAD WITHOUT CONTRAST CT CERVICAL SPINE WITHOUT CONTRAST TECHNIQUE: Multidetector CT imaging of the head and cervical spine was performed following the standard protocol without intravenous contrast. Multiplanar CT image reconstructions of the cervical spine were also generated. COMPARISON:  CT head dated May 27, 2016. FINDINGS: CT HEAD FINDINGS Brain: No evidence of acute infarction, hemorrhage, hydrocephalus, extra-axial collection or mass lesion/mass effect. Stable mild atrophy and chronic microvascular ischemic changes. Vascular: Atherosclerotic vascular calcification of the carotid siphons. No hyperdense vessel. Skull: Normal. Negative for fracture or focal lesion.  Sinuses/Orbits: No acute finding. Other: None. CT CERVICAL SPINE FINDINGS Alignment: Normal. Skull base and vertebrae: No acute fracture. No primary bone lesion or focal pathologic process. Soft tissues and spinal canal: No prevertebral fluid or swelling. No visible canal hematoma. Disc levels: Small disc protrusions at C4-C5 and C5-C6. Mild bilateral uncovertebral hypertrophy at C5-C6. Upper chest: Negative. Other: None. IMPRESSION: 1. No acute intracranial abnormality. 2. No acute cervical spine fracture or traumatic listhesis. Electronically Signed   By: Titus Dubin M.D.   On: 01/09/2021 09:24        Scheduled Meds:  atorvastatin  40 mg Oral QPM   dicyclomine  10 mg Oral TID AC   docusate sodium  100 mg Oral BID   enoxaparin (LOVENOX) injection  40 mg Subcutaneous Q24H   feeding supplement  237 mL Oral TID BM   fluticasone furoate-vilanterol  1 puff Inhalation Daily   lactulose  20 g Oral TID   loratadine  10 mg Oral Daily   losartan  50 mg Oral Daily   magnesium oxide  800 mg Oral Q4H   multivitamin with minerals  1 tablet Oral Daily   potassium chloride  20 mEq Oral Once   sodium chloride flush  3 mL Intravenous Q12H   Continuous Infusions:  cefTRIAXone (ROCEPHIN)  IV 1 g (01/10/21 1030)     LOS: 1 day   Time spent= 35 mins    Edmundo Tedesco Arsenio Loader, MD [facility]  If 7PM-7AM, please contact night-coverage  01/11/2021, 8:16 AM

## 2021-01-11 NOTE — Progress Notes (Signed)
[facility] night shift MedSurg cverage note.   The patient asked the nursing staff to have some loperamide due to diarrhea since she has a lactulose and magnesium oxide earlier.  Loperamide 2 mg p.o. x1 dose ordered.  Tennis Must, MD.

## 2021-01-11 NOTE — Progress Notes (Signed)
[place] visited pt. in response to [facility] consult for assistance contacting pt.'s priest.  When [facility] arrived, pt. sitting eating dinner in recliner w/dtr. at bedside.  Daughter amused by Ch's question re: priest as family and pt. are [place], but she shared pt.'s pastor did visit this afternoon as requested.  Dtr. shared pt. has dementia but has become markedly more confused and altered in personality due to an infection.  No immediate needs at this time, but pt. and dtr. grateful for visit.

## 2021-01-11 NOTE — Evaluation (Signed)
Physical Therapy Evaluation Patient Details Name: Nichole Silva MRN: [id number] DOB: August 07, 1934 Today's Date: 01/11/2021   History of Present Illness  Pt is an 86 y.o. female who presented 01/08/21 s/p fall and with AMS. Pt found to have UTI. PMH: CAD; HLD; DM; vaginal prolapse with pessary; and HTN   Clinical Impression  Pt presents with condition above and deficits mentioned below, see PT Problem List. PTA, pt was independent and was the caregiver for her husband. They live in a house with a ramped entrance. Currently, she appears to have a high fear of falling and demonstrates generalized weakness, balance deficits, cognitive deficits, and decreased activity tolerance. Pt is needing max-TA for all mobility this date and was unable to take steps to ambulate at this time. Expect once pt's cognitive status improves so will her functional status. Recommending follow-up with HHPT. Will continue to follow acutely.     Follow Up Recommendations Home health PT;Supervision/Assistance - 24 hour    Equipment Recommendations  Rolling walker with 5" wheels;Other (comment) (tub bench)    Recommendations for Other Services       Precautions / Restrictions Precautions Precautions: Fall Restrictions Weight Bearing Restrictions: No      Mobility  Bed Mobility Overal bed mobility: Needs Assistance Bed Mobility: Supine to Sit;Rolling Rolling: Total assist   Supine to sit: Max assist;HOB elevated     General bed mobility comments: Pt assisting in moving legs to EOB and pulling up on therapist's hands to ascend trunk, maxA. Pt needing TA to roll each direction reclined in chair due to pt resisting out of fear of falling.    Transfers Overall transfer level: Needs assistance Equipment used: 1 person hand held assist Transfers: Sit to/from [facility] Sit to Stand: Max assist Stand pivot transfers: Max assist       General transfer comment: Cues for pt to hug therapist,  success with R arm but not with L due to limitations in ROM from prior sx. Pt with knee pain coming to stand, resulting in flexed posture, needing maxA to power up to stand. MaxA to pivot to R bed > recliner.  Ambulation/Gait             General Gait Details: Unable this date.  Stairs            Wheelchair Mobility    Modified Rankin (Stroke Patients Only) Modified Rankin (Stroke Patients Only) Pre-Morbid Rankin Score: No symptoms Modified Rankin: Severe disability     Balance Overall balance assessment: Needs assistance Sitting-balance support: Bilateral upper extremity supported;Feet supported Sitting balance-Leahy Scale: Poor Sitting balance - Comments: UE support and min guard-A.   Standing balance support: Bilateral upper extremity supported Standing balance-Leahy Scale: Poor Standing balance comment: MaxA and UE support to stand.                             Pertinent Vitals/Pain Pain Assessment: Faces Faces Pain Scale: Hurts even more Pain Location: L elbow, knees with mobility Pain Descriptors / Indicators: Discomfort;Grimacing;Guarding;Moaning Pain Intervention(s): Limited activity within patient's tolerance;Monitored during session;Repositioned    Home Living Family/patient expects to be discharged to:: Private residence Living Arrangements: Spouse/significant other;Children Available Help at Discharge: Family;Available 24 hours/day Type of Home: House Home Access: Ramped entrance     Home Layout: One level (x1 short step between one room that they barely use) Home Equipment: Wheelchair - manual;Toilet riser;Shower seat;Bedside commode;Cane - single point;Walker - 4 wheels;Hospital bed  Prior Function Level of Independence: Independent         Comments: Pt typically is the caregiver for her husband.     Hand Dominance   Dominant Hand: Right    Extremity/Trunk Assessment   Upper Extremity Assessment Upper Extremity  Assessment: Generalized weakness;LUE deficits/detail LUE Deficits / Details: Hx of elbow sx and limitation in ROM    Lower Extremity Assessment Lower Extremity Assessment: Generalized weakness (bil knee stiffness noted)    Cervical / Trunk Assessment Cervical / Trunk Assessment: Kyphotic  Communication   Communication: No difficulties  Cognition Arousal/Alertness: Awake/alert Behavior During Therapy: Anxious Overall Cognitive Status: Impaired/Different from baseline Area of Impairment: Orientation;Attention;Memory;Following commands;Safety/judgement;Awareness;Problem solving                   Current Attention Level: Selective Memory: Decreased short-term memory Following Commands: Follows one step commands inconsistently;Follows one step commands with increased time Safety/Judgement: Decreased awareness of safety;Decreased awareness of deficits Awareness: Intellectual Problem Solving: Slow processing;Decreased initiation;Difficulty sequencing;Requires verbal cues;Requires tactile cues General Comments: Per daughter, pt is cognitively intact at baseline and is caregiver for husband. Pt is perseverating on dates and changing subjects often. HOH. Pt with fear of falling and is anxious to move. Pt trying to change subjects when cued to mobilize. Needs extensive cues to sequence and perform all tasks with delayed initiation.      General Comments      Exercises     Assessment/Plan    PT Assessment Patient needs continued PT services  PT Problem List Decreased strength;Decreased range of motion;Decreased activity tolerance;Decreased balance;Decreased mobility;Decreased coordination;Decreased cognition;Decreased knowledge of use of DME;Decreased safety awareness       PT Treatment Interventions DME instruction;Gait training;Stair training;Functional mobility training;Therapeutic activities;Balance training;Therapeutic exercise;Neuromuscular re-education;Cognitive  remediation;Patient/family education;Wheelchair mobility training    PT Goals (Current goals can be found in the Care Plan section)  Acute Rehab PT Goals Patient Stated Goal: to not fall PT Goal Formulation: With patient/family Time For Goal Achievement: 01/25/21 Potential to Achieve Goals: Good    Frequency Min 3X/week   Barriers to discharge        Co-evaluation               AM-PAC PT "6 Clicks" Mobility  Outcome Measure Help needed turning from your back to your side while in a flat bed without using bedrails?: Total Help needed moving from lying on your back to sitting on the side of a flat bed without using bedrails?: A Lot Help needed moving to and from a bed to a chair (including a wheelchair)?: A Lot Help needed standing up from a chair using your arms (e.g., wheelchair or bedside chair)?: A Lot Help needed to walk in hospital room?: Total Help needed climbing 3-5 steps with a railing? : Total 6 Click Score: 9    End of Session Equipment Utilized During Treatment: Gait belt Activity Tolerance: Patient limited by pain;Other (comment) (limited by anxiety with movement) Patient left: in chair;with call bell/phone within reach;with chair alarm set;with family/visitor present Nurse Communication: Mobility status PT Visit Diagnosis: Unsteadiness on feet (R26.81);Muscle weakness (generalized) (M62.81);History of falling (Z91.81);Difficulty in walking, not elsewhere classified (R26.2)    Time: [id number] PT Time Calculation (min) (ACUTE ONLY): 64 min   Charges:   PT Evaluation $PT Eval Moderate Complexity: 1 Mod PT Treatments $Therapeutic Activity: 38-52 mins        Moishe Spice, PT, DPT Acute Rehabilitation Services  Pager: [phone] Office: [phone]   Orvan Falconer 01/11/2021,  3:35 PM

## 2021-01-11 NOTE — Evaluation (Signed)
Speech Language Pathology Evaluation Patient Details Name: Nichole Silva MRN: [id number] DOB: Jan 08, 1935 Today's Date: 01/11/2021 Time: [id number] SLP Time Calculation (min) (ACUTE ONLY): 33 min  Problem List:  Patient Active Problem List   Diagnosis Date Noted   AMS (altered mental status) 01/10/2021   Malnutrition of moderate degree 01/10/2021   Fall at home, initial encounter 01/09/2021   Failure to thrive in adult 01/09/2021   Acute lower UTI 01/09/2021   Deficiency anemia 07/23/2020   Dementia without behavioral disturbance ([place]) 11/17/2018   Estrogen deficiency 03/08/2017   Age-related osteoporosis with current pathological fracture 03/08/2017   Eczema 05/28/2016   Asthma, moderate persistent 07/29/2015   IBS (irritable bowel syndrome) 01/30/2015   Pessary maintenance 10/25/2013   Cystocele with uterine prolapse 10/25/2013   Anxiety 10/06/2013   Routine general medical examination at a health care facility 09/14/2013   Vitamin D deficiency 02/25/2009   DEGENERATIVE JOINT DISEASE 02/27/2008   Type II diabetes mellitus with manifestations ([place]) 09/02/2007   Hyperlipidemia with target LDL less than 100 07/05/2007   Essential hypertension 07/05/2007   Coronary atherosclerosis 07/05/2007   Past Medical History:  Past Medical History:  Diagnosis Date   Allergic rhinitis, cause unspecified    Anxiety state, unspecified    Asthma    Coronary atherosclerosis of unspecified type of vessel, native or graft    Depression    Female bladder prolapse    Hyperlipidemia    IBS (irritable bowel syndrome)    Osteoarthrosis, unspecified whether generalized or localized, unspecified site    Type II or unspecified type diabetes mellitus without mention of complication, not stated as uncontrolled    Unspecified chronic bronchitis (HCC)    Unspecified essential hypertension    Unspecified hearing loss    Unspecified venous (peripheral) insufficiency    Unspecified vitamin D deficiency     Past Surgical History:  Past Surgical History:  Procedure Laterality Date   Cataract surg  2009   CHOLECYSTECTOMY  1997   DILATION AND CURETTAGE OF UTERUS  1960's   Left elbow surgery  1992   HPI:  86 y.o. female with medical history significant of CAD; HLD; DM; vaginal prolapse with pessary; and HTN presenting with a fall.   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  She was in the living room and picking up O2 tanks and fell.  Her daughter thinks it was imbalance due to the tanks rather than dizziness/syncope.  Her daughter was able to get her in a sitting position.  She moved eventually into another room and tripped over the oxygen tubing in another room and they could not get her up.  They called 911 and they were able to get her up and brought her in to the ER.  The previous day she was at the [facility] with her husband and walked all day without difficulty, but yesterday she did not seem like herself.  She has significant hearing impairment and this makes it hard for them to know for sure what is happening with her.  She usually manages her own and her husband's affairs but struggles with the oxygen tanks  She has lost about 7 pounds in the last year.  She did have a UTI earlier this year, treated by Dr. Marvel Plan; she is at increased risk for them due to prolapse/pessary.  No obvious urinary symptoms, just weakness.  She has been oddly confused, atypical for her.  No known h/o dementia. SLE generated  d/t impaired cognition during this hospital stay.  Assessment / Plan / Recommendation Clinical Impression  Pt seen for cognitive/linguistic evaluation with pt being able to follow 2-3 step commands for functional tasks with [facility] status being taken into consideration and directives written and/or volume increased for pt to be able to hear directives/tasks adequately.  Primary difficulty was d/t hearing impairment and hearing aids unavailable during the  assessment.  Pt's speech judged to be intelligible within simple conversation and pt oriented x4.  Pt reading environmental signs within room per daughter's report and stated her Mother was "almost back to baseline."  Pt able to name items in confrontational naming tasks and answer questions appropriately when asked about personal information 90% of the time.  No aphasia present and attention tasks (given HOH status) were [facility].  STM min impaired at baseline.  ST will s/o at this time as pt appears to be functioning at baseline level.  Thank you for this consult.    SLP Assessment  SLP Recommendation/Assessment: Patient does not need any further Speech Language Pathology Services SLP Visit Diagnosis: Cognitive communication deficit (R41.841)    Follow Up Recommendations  None    Frequency and Duration     Evaluation only      SLP Evaluation Cognition  Overall Cognitive Status: Within Functional Limits for tasks assessed Arousal/Alertness: Awake/alert Orientation Level: Oriented X4 Attention: Sustained Sustained Attention: Appears intact Memory: Impaired Memory Impairment: Decreased short term memory;Retrieval deficit;Other (comment) (at baseline d/t age per family/pt report; able to perform daily ADLs prior to hospitalization) Decreased Short Term Memory: Verbal basic;Functional basic Immediate Memory Recall: Sock;Blue;Bed Memory Recall Sock: Without Cue Memory Recall Blue: Without Cue Memory Recall Bed: With Cue Awareness: Appears intact Problem Solving: Appears intact Safety/Judgment: Appears intact Comments: Pt is extremely HOH (has aids, not with her), so this impacts overall auditory comprehensionk, especially with masks bc pt reads lips       Comprehension  Auditory Comprehension Overall Auditory Comprehension: Other (comment) (impaired d/t HOH, when has appropriate verbal/visual cues, [facility]) Yes/No Questions: Within Functional Limits Commands: Within Functional  Limits Conversation: Simple Interfering Components: Hearing EffectiveTechniques: Repetition;Increased volume;Slowed speech;Stressing words;Visual/Gestural cues Visual Recognition/Discrimination Discrimination: Within Function Limits Reading Comprehension Reading Status: Within funtional limits    Expression Expression Primary Mode of Expression: Verbal Verbal Expression Overall Verbal Expression: Appears within functional limits for tasks assessed Initiation: No impairment Level of Generative/Spontaneous Verbalization: Conversation Repetition: No impairment Naming: No impairment Pragmatics: No impairment Non-Verbal Means of Communication: Not applicable Other Verbal Expression Comments: relies on reading lips/non-verbal cues when hearing aids not present Written Expression Dominant Hand: Right Written Expression: Not tested   Oral / Motor  Oral Motor/Sensory Function Overall Oral Motor/Sensory Function: Within functional limits Motor Speech Overall Motor Speech: Appears within functional limits for tasks assessed Respiration: Within functional limits Phonation: Normal Resonance: Within functional limits Articulation: Within functional limitis Intelligibility: Intelligible Motor Planning: Witnin functional limits Motor Speech Errors: Not applicable   GO                    Elvina Sidle, M.S., CCC-SLP 01/11/2021, 1:42 PM

## 2021-01-12 DIAGNOSIS — R531 Weakness: Secondary | ICD-10-CM | POA: Diagnosis not present

## 2021-01-12 DIAGNOSIS — J387 Other diseases of larynx: Secondary | ICD-10-CM | POA: Diagnosis not present

## 2021-01-12 DIAGNOSIS — R059 Cough, unspecified: Secondary | ICD-10-CM | POA: Diagnosis not present

## 2021-01-12 DIAGNOSIS — K580 Irritable bowel syndrome with diarrhea: Secondary | ICD-10-CM | POA: Diagnosis not present

## 2021-01-12 DIAGNOSIS — G9340 Encephalopathy, unspecified: Secondary | ICD-10-CM | POA: Diagnosis not present

## 2021-01-12 DIAGNOSIS — R410 Disorientation, unspecified: Secondary | ICD-10-CM | POA: Diagnosis not present

## 2021-01-12 DIAGNOSIS — L89311 Pressure ulcer of right buttock, stage 1: Secondary | ICD-10-CM | POA: Diagnosis not present

## 2021-01-12 DIAGNOSIS — Z8249 Family history of ischemic heart disease and other diseases of the circulatory system: Secondary | ICD-10-CM | POA: Diagnosis not present

## 2021-01-12 DIAGNOSIS — M6281 Muscle weakness (generalized): Secondary | ICD-10-CM | POA: Diagnosis not present

## 2021-01-12 DIAGNOSIS — E785 Hyperlipidemia, unspecified: Secondary | ICD-10-CM | POA: Diagnosis not present

## 2021-01-12 DIAGNOSIS — I517 Cardiomegaly: Secondary | ICD-10-CM | POA: Diagnosis not present

## 2021-01-12 DIAGNOSIS — R5381 Other malaise: Secondary | ICD-10-CM | POA: Diagnosis not present

## 2021-01-12 DIAGNOSIS — Z743 Need for continuous supervision: Secondary | ICD-10-CM | POA: Diagnosis not present

## 2021-01-12 DIAGNOSIS — I2510 Atherosclerotic heart disease of native coronary artery without angina pectoris: Secondary | ICD-10-CM | POA: Diagnosis not present

## 2021-01-12 DIAGNOSIS — R296 Repeated falls: Secondary | ICD-10-CM | POA: Diagnosis not present

## 2021-01-12 DIAGNOSIS — I10 Essential (primary) hypertension: Secondary | ICD-10-CM

## 2021-01-12 DIAGNOSIS — M6258 Muscle wasting and atrophy, not elsewhere classified, other site: Secondary | ICD-10-CM | POA: Diagnosis not present

## 2021-01-12 DIAGNOSIS — R1310 Dysphagia, unspecified: Secondary | ICD-10-CM | POA: Diagnosis not present

## 2021-01-12 DIAGNOSIS — G319 Degenerative disease of nervous system, unspecified: Secondary | ICD-10-CM | POA: Diagnosis not present

## 2021-01-12 DIAGNOSIS — J45909 Unspecified asthma, uncomplicated: Secondary | ICD-10-CM | POA: Diagnosis not present

## 2021-01-12 DIAGNOSIS — R6889 Other general symptoms and signs: Secondary | ICD-10-CM | POA: Diagnosis not present

## 2021-01-12 DIAGNOSIS — N8111 Cystocele, midline: Secondary | ICD-10-CM | POA: Diagnosis not present

## 2021-01-12 DIAGNOSIS — Z0389 Encounter for observation for other suspected diseases and conditions ruled out: Secondary | ICD-10-CM | POA: Diagnosis not present

## 2021-01-12 DIAGNOSIS — N390 Urinary tract infection, site not specified: Secondary | ICD-10-CM | POA: Diagnosis not present

## 2021-01-12 DIAGNOSIS — Z20822 Contact with and (suspected) exposure to covid-19: Secondary | ICD-10-CM | POA: Diagnosis not present

## 2021-01-12 DIAGNOSIS — Z9181 History of falling: Secondary | ICD-10-CM | POA: Diagnosis not present

## 2021-01-12 DIAGNOSIS — R4182 Altered mental status, unspecified: Secondary | ICD-10-CM

## 2021-01-12 DIAGNOSIS — R2689 Other abnormalities of gait and mobility: Secondary | ICD-10-CM | POA: Diagnosis not present

## 2021-01-12 DIAGNOSIS — Z87440 Personal history of urinary (tract) infections: Secondary | ICD-10-CM | POA: Diagnosis not present

## 2021-01-12 DIAGNOSIS — R1312 Dysphagia, oropharyngeal phase: Secondary | ICD-10-CM | POA: Diagnosis not present

## 2021-01-12 LAB — CBC
HCT: 31.7 % — ABNORMAL LOW (ref 36.0–46.0)
Hemoglobin: 10.2 g/dL — ABNORMAL LOW (ref 12.0–15.0)
MCH: 31 pg (ref 26.0–34.0)
MCHC: 32.2 g/dL (ref 30.0–36.0)
MCV: 96.4 fL (ref 80.0–100.0)
Platelets: 197 10*3/uL (ref 150–400)
RBC: 3.29 MIL/uL — ABNORMAL LOW (ref 3.87–5.11)
RDW: 12.9 % (ref 11.5–15.5)
WBC: 10.2 10*3/uL (ref 4.0–10.5)
nRBC: 0 % (ref 0.0–0.2)

## 2021-01-12 LAB — COMPREHENSIVE METABOLIC PANEL
ALT: 8 U/L (ref 0–44)
AST: 11 U/L — ABNORMAL LOW (ref 15–41)
Albumin: 2.5 g/dL — ABNORMAL LOW (ref 3.5–5.0)
Alkaline Phosphatase: 57 U/L (ref 38–126)
Anion gap: 6 (ref 5–15)
BUN: 11 mg/dL (ref 8–23)
CO2: 25 mmol/L (ref 22–32)
Calcium: 8.3 mg/dL — ABNORMAL LOW (ref 8.9–10.3)
Chloride: 107 mmol/L (ref 98–111)
Creatinine, Ser: 0.7 mg/dL (ref 0.44–1.00)
GFR, Estimated: >60 mL/min (ref >60)
Glucose, Bld: 133 mg/dL — ABNORMAL HIGH (ref 70–99)
Potassium: 4 mmol/L (ref 3.5–5.1)
Sodium: 138 mmol/L (ref 135–145)
Total Bilirubin: 0.8 mg/dL (ref 0.3–1.2)
Total Protein: 5.9 g/dL — ABNORMAL LOW (ref 6.5–8.1)

## 2021-01-12 LAB — URINE CULTURE: Culture: >=100000 — AB

## 2021-01-12 LAB — MAGNESIUM: Magnesium: 1.7 mg/dL (ref 1.7–2.4)

## 2021-01-12 MED ORDER — MAGNESIUM SULFATE 2 GM/50ML IV SOLN
2.0000 g | Freq: Once | INTRAVENOUS | Status: AC
  Administered 2021-01-12: 2 g via INTRAVENOUS
  Filled 2021-01-12: qty 50

## 2021-01-12 MED ORDER — ENSURE ENLIVE PO LIQD: 237.0000 mL | Freq: Three times a day (TID) | ORAL | 0 refills | Status: AC

## 2021-01-12 MED ORDER — CEPHALEXIN 500 MG PO CAPS: 500.0000 mg | ORAL_CAPSULE | Freq: Three times a day (TID) | ORAL | 0 refills | Status: DC

## 2021-01-12 NOTE — TOC Transition Note (Addendum)
Transition of Care [facility]) - CM/SW Discharge Note   Patient Details  Name: Emmarie Overman MRN: [id number] Date of Birth: [birth year]  [facility] CM/SW Contact:  Carles Collet, RN Phone Number: 01/12/2021, 10:09 AM   Clinical Narrative:    Damaris Schooner w daughter Juliann Pulse at bedside. She states that there is family support at home that they plan to [place] for aid.  [place] set up with [place]. DME WC and 3/1 will be delivered to the room and Juliann Pulse will take home. Harrel Lemon will be sent to the home Patient will need PTAR, address verified w Juliann Pulse. PTAR forms sent to unit. PTAR called.     Final next level of care: [place] Barriers to Discharge: No Barriers Identified   Patient Goals and CMS Choice Patient states their goals for this hospitalization and ongoing recovery are:: to go home CMS Medicare.gov Compare Post Acute Care list provided to:: Patient Choice offered to / list presented to : Adult Children  Discharge Placement                       Discharge Plan and Services                DME Arranged: 3-N-1, Lightweight manual wheelchair with seat cushion DME Agency: [employer] Date DME Agency Contacted: 01/12/21 Time DME Agency Contacted: [id number] Representative spoke with at DME Agency: [place]: RN, PT, OT, Nurse's Aide, Social Work [employer] Agency: [place] Date [place]: 01/12/21 Time [place]: [phone] Representative spoke with at [place]: [place] ([place]) Interventions     Readmission Risk Interventions No flowsheet data found.

## 2021-01-12 NOTE — Care Management (Signed)
    Durable Medical Equipment  (From admission, onward)           Start     Ordered   01/12/21 0954  For home use only DME lightweight manual wheelchair with seat cushion  Once       Comments: Patient suffers from weakness which impairs their ability to perform daily activities like dressing in the home.  A walker will not resolve  issue with performing activities of daily living. A wheelchair will allow patient to safely perform daily activities. Patient is not able to propel themselves in the home using a standard weight wheelchair due to general weakness. Patient can self propel in the lightweight wheelchair. Length of need Lifetime. Accessories: elevating leg rests (ELRs), wheel locks, extensions and anti-tippers.   01/12/21 0954   01/12/21 0954  For home use only DME 3 n 1  Once        01/12/21 [id number]

## 2021-01-12 NOTE — Progress Notes (Signed)
Rosanne Sack to be D/C'd Home per MD order.  Discussed with the patient and all questions fully answered.  VSS, Skin clean, dry and intact without evidence of skin break down, no evidence of skin tears noted. IV catheter discontinued intact. Site without signs and symptoms of complications. Dressing and pressure applied.  An After Visit Summary was printed and given to the patient. Patient received prescription.  D/c education completed with patient/family including follow up instructions, medication list, d/c activities limitations if indicated, with other d/c instructions as indicated by MD - patient able to verbalize understanding, all questions fully answered.   Patient instructed to return to ED, call 911, or call MD for any changes in condition.     [place] 01/12/2021 2:51 PM

## 2021-01-12 NOTE — Discharge Summary (Signed)
PATIENT DETAILS Name: Nichole Silva Age: 86 y.o. Sex: female Date of Birth: 16-Jul-1934 MRN: [id number]. Admitting Physician: Damita Lack, MD [id number], Arvid Right, MD  Admit Date: 01/08/2021 Discharge date: 01/12/2021  Recommendations for Outpatient Follow-up:  Follow up with PCP in 1-2 weeks Please obtain CMP/CBC in one week   Admitted From:  Home  Disposition:  [place]: No  Equipment/Devices: None  Discharge Condition: Stable  CODE STATUS: FULL CODE  Diet recommendation:  Diet Order             Diet - low sodium heart healthy           Diet regular Room service appropriate? No; Fluid consistency: Thin  Diet effective now                    Brief Summary: See H&P, Labs, Consult and Test reports for all details in brief, patient is a 86 year old female with significant hearing loss-with past medical history of CAD, HLD, DM, vaginal prolapse who presented with mechanical fall and encephalopathy-she was found to have a UTI and admitted to the hospitalist service.  Brief Hospital Course: Acute metabolic encephalopathy: Probably due to UTI-improved-back to baseline.  Stable for discharge.  Note-patient is incredibly hard of hearing-but is able to lip read  UTI: Urine cultures positive for Klebsiella-she unfortunately has a vaginal prolapse with cystocele-and has diarrhea prominent IBS-and gets frequent UTIs.  She was managed with IV Rocephin-we will transition to Keflex for a few more days on discharge.    Mechanical fall/debility: CT head/C-spine x-rays were negative for any significant injuries-evaluated by PT/OT-patient/family prefers home with maximal home health services.  Social work/case management consulted on discharge for home health/DME service needs.  HTN: Continue Cozaar  DM-2: Diet controlled  HLD: Continue statin  COPD: Continue usual inhaler regimen-this was compensated without any flare.  Nutrition Problem: Nutrition  Problem: Moderate Malnutrition Etiology: chronic illness (CAD) Signs/Symptoms: mild fat depletion, moderate fat depletion, mild muscle depletion, moderate muscle depletion Interventions: Ensure Enlive (each supplement provides 350kcal and 20 grams of protein), MVI, Liberalize Diet   Procedures None  Discharge Diagnoses:  Principal Problem:   Fall at home, initial encounter Active Problems:   Type II diabetes mellitus with manifestations ([place])   Hyperlipidemia with target LDL less than 100   Essential hypertension   Failure to thrive in adult   Acute lower UTI   AMS (altered mental status)   Malnutrition of moderate degree   Discharge Instructions:  Activity:  As tolerated with Full fall precautions use walker/cane & assistance as needed   Discharge Instructions     Call MD for:  extreme fatigue   Complete by: As directed    Call MD for:  persistant dizziness or light-headedness   Complete by: As directed    Call MD for:  severe uncontrolled pain   Complete by: As directed    Diet - low sodium heart healthy   Complete by: As directed    Discharge instructions   Complete by: As directed    Follow with Primary MD  Janith Lima, MD in 1-2 weeks  Please get a complete blood count and chemistry panel checked by your Primary MD at your next visit, and again as instructed by your Primary MD.  Get Medicines reviewed and adjusted: Please take all your medications with you for your next visit with your Primary MD  Laboratory/radiological data: Please request your Primary MD to go over  all hospital tests and procedure/radiological results at the follow up, please ask your Primary MD to get all Hospital records sent to his/her office.  In some cases, they will be blood work, cultures and biopsy results pending at the time of your discharge. Please request that your primary care M.D. follows up on these results.  Also Note the following: If you experience worsening of your  admission symptoms, develop shortness of breath, life threatening emergency, suicidal or homicidal thoughts you must seek medical attention immediately by calling 911 or calling your MD immediately  if symptoms less severe.  You must read complete instructions/literature along with all the possible adverse reactions/side effects for all the Medicines you take and that have been prescribed to you. Take any new Medicines after you have completely understood and accpet all the possible adverse reactions/side effects.   Do not drive when taking Pain medications or sleeping medications (Benzodaizepines)  Do not take more than prescribed Pain, Sleep and Anxiety Medications. It is not advisable to combine anxiety,sleep and pain medications without talking with your primary care practitioner  Special Instructions: If you have smoked or chewed Tobacco  in the last 2 yrs please stop smoking, stop any regular Alcohol  and or any Recreational drug use.  Wear Seat belts while driving.  Please note: You were cared for by a hospitalist during your hospital stay. Once you are discharged, your primary care physician will handle any further medical issues. Please note that NO REFILLS for any discharge medications will be authorized once you are discharged, as it is imperative that you return to your primary care physician (or establish a relationship with a primary care physician if you do not have one) for your post hospital discharge needs so that they can reassess your need for medications and monitor your lab values.   Increase activity slowly   Complete by: As directed       Allergies as of 01/12/2021       Reactions   Nifedipine Swelling   Codeine    Unknown per pt   Metformin And Related    N&V   Other Diarrhea   Shelled beans, severe IBS   Penicillins    REACTION: ITCHING AND SWELLING        Medication List     STOP taking these medications    ALPRAZolam 1 MG tablet Commonly known as:  XANAX   furosemide 40 MG tablet Commonly known as: LASIX       TAKE these medications    acetaminophen 325 MG tablet Commonly known as: TYLENOL Take 325 mg by mouth every 6 (six) hours as needed for mild pain or moderate pain. Pain/headache   atorvastatin 40 MG tablet Commonly known as: LIPITOR TAKE 1 TABLET BY MOUTH DAILY AT 6 PM. What changed: when to take this   Breo Ellipta 200-25 MCG/INH Aepb Generic drug: fluticasone furoate-vilanterol INHALE 1 PUFF BY MOUTH EVERY DAY   cephALEXin 500 MG capsule Commonly known as: Keflex Take 1 capsule (500 mg total) by mouth 3 (three) times daily.   cetirizine 10 MG tablet Commonly known as: ZYRTEC Take 10 mg by mouth daily.   dicyclomine 10 MG capsule Commonly known as: BENTYL TAKE ONE CAPSULE BY MOUTH 3 TIMES A DAY BEFORE MEALS What changed:  how much to take how to take this when to take this additional instructions   feeding supplement Liqd Take 237 mLs by mouth 3 (three) times daily between meals.   fluticasone 50 MCG/ACT  nasal spray Commonly known as: FLONASE PLACE 2 SPRAYS INTO BOTH NOSTRILS AT BEDTIME What changed:  when to take this reasons to take this   losartan 50 MG tablet Commonly known as: COZAAR TAKE 1 TABLET BY MOUTH EVERY DAY   ProAir HFA 108 (90 Base) MCG/ACT inhaler Generic drug: albuterol INHALE 1 TO 2 PUFFS BY MOUTH EVERY 4 HOURS AS NEEDED FOR WHEEZE OR SHORTNESS OF BREATH What changed: See the new instructions.   vitamin B-12 1000 MCG tablet Commonly known as: CYANOCOBALAMIN Take 1,000 mcg by mouth daily.   Vitamin D (Ergocalciferol) 1.25 MG (50000 UNIT) Caps capsule Commonly known as: DRISDOL TAKE 1 CAPSULE BY MOUTH EVERY 7 DAYS        Follow-up Information     Janith Lima, MD. Schedule an appointment as soon as possible for a visit in 1 week(s).   Specialty: Internal Medicine Why: Hospital follow up Contact information: [place]  [address] [phone]                Allergies  Allergen Reactions   Nifedipine Swelling   Codeine     Unknown per pt    Metformin And Related     N&V   Other Diarrhea    Shelled beans, severe IBS   Penicillins     REACTION: ITCHING AND SWELLING      Consultations:  None   Other Procedures/Studies: DG Ribs Unilateral W/Chest Left  Result Date: 01/09/2021 CLINICAL DATA:  Status post fall.  Pain EXAM: LEFT RIBS AND CHEST - 3+ VIEW COMPARISON:  04/06/2019 FINDINGS: No fracture or other bone lesions are seen involving the ribs. There is no evidence of pneumothorax or pleural effusion. Lung volumes are low. There is mild platelike atelectasis in the lung bases. Heart size and mediastinal contours are within normal limits. IMPRESSION: 1. No rib fractures identified. 2. Low lung volumes and bibasilar atelectasis. Electronically Signed   By: Kerby Moors M.D.   On: 01/09/2021 09:49   DG Pelvis 1-2 Views  Result Date: 01/09/2021 CLINICAL DATA:  Fall. EXAM: PELVIS - 1-2 VIEW COMPARISON:  CT abdomen pelvis dated May 27, 2016. FINDINGS: There is no evidence of pelvic fracture or diastasis. No pelvic bone lesions are seen. IMPRESSION: Negative. Electronically Signed   By: Titus Dubin M.D.   On: 01/09/2021 09:48   CT Head Wo Contrast  Result Date: 01/09/2021 CLINICAL DATA:  Fall. EXAM: CT HEAD WITHOUT CONTRAST CT CERVICAL SPINE WITHOUT CONTRAST TECHNIQUE: Multidetector CT imaging of the head and cervical spine was performed following the standard protocol without intravenous contrast. Multiplanar CT image reconstructions of the cervical spine were also generated. COMPARISON:  CT head dated May 27, 2016. FINDINGS: CT HEAD FINDINGS Brain: No evidence of acute infarction, hemorrhage, hydrocephalus, extra-axial collection or mass lesion/mass effect. Stable mild atrophy and chronic microvascular ischemic changes. Vascular: Atherosclerotic vascular calcification of the carotid  siphons. No hyperdense vessel. Skull: Normal. Negative for fracture or focal lesion. Sinuses/Orbits: No acute finding. Other: None. CT CERVICAL SPINE FINDINGS Alignment: Normal. Skull base and vertebrae: No acute fracture. No primary bone lesion or focal pathologic process. Soft tissues and spinal canal: No prevertebral fluid or swelling. No visible canal hematoma. Disc levels: Small disc protrusions at C4-C5 and C5-C6. Mild bilateral uncovertebral hypertrophy at C5-C6. Upper chest: Negative. Other: None. IMPRESSION: 1. No acute intracranial abnormality. 2. No acute cervical spine fracture or traumatic listhesis. Electronically Signed   By: Titus Dubin M.D.   On:  01/09/2021 09:24   CT Cervical Spine Wo Contrast  Result Date: 01/09/2021 CLINICAL DATA:  Fall. EXAM: CT HEAD WITHOUT CONTRAST CT CERVICAL SPINE WITHOUT CONTRAST TECHNIQUE: Multidetector CT imaging of the head and cervical spine was performed following the standard protocol without intravenous contrast. Multiplanar CT image reconstructions of the cervical spine were also generated. COMPARISON:  CT head dated May 27, 2016. FINDINGS: CT HEAD FINDINGS Brain: No evidence of acute infarction, hemorrhage, hydrocephalus, extra-axial collection or mass lesion/mass effect. Stable mild atrophy and chronic microvascular ischemic changes. Vascular: Atherosclerotic vascular calcification of the carotid siphons. No hyperdense vessel. Skull: Normal. Negative for fracture or focal lesion. Sinuses/Orbits: No acute finding. Other: None. CT CERVICAL SPINE FINDINGS Alignment: Normal. Skull base and vertebrae: No acute fracture. No primary bone lesion or focal pathologic process. Soft tissues and spinal canal: No prevertebral fluid or swelling. No visible canal hematoma. Disc levels: Small disc protrusions at C4-C5 and C5-C6. Mild bilateral uncovertebral hypertrophy at C5-C6. Upper chest: Negative. Other: None. IMPRESSION: 1. No acute intracranial abnormality. 2. No  acute cervical spine fracture or traumatic listhesis. Electronically Signed   By: Titus Dubin M.D.   On: 01/09/2021 09:24     TODAY-DAY OF DISCHARGE:  Subjective:   Nichole Silva today has no headache,no chest abdominal pain,no new weakness tingling or numbness, feels much better wants to go home today.  Objective:   Blood pressure (!) 161/61, pulse 81, temperature 98.9 F (37.2 C), temperature source Axillary, resp. rate 16, SpO2 97 %.  Intake/Output Summary (Last 24 hours) at 01/12/2021 0950 Last data filed at 01/11/2021 1722 Gross per 24 hour  Intake 212.94 ml  Output --  Net 212.94 ml   There were no vitals filed for this visit.  Exam: Awake Alert, Oriented *3, No new F.N deficits, Normal affect [place].AT,PERRAL Supple Neck,No JVD, No cervical lymphadenopathy appriciated.  Symmetrical Chest wall movement, Good air movement bilaterally, CTAB RRR,No Gallops,Rubs or new Murmurs, No Parasternal Heave +ve B.Sounds, Abd Soft, Non tender, No organomegaly appriciated, No rebound -guarding or rigidity. No Cyanosis, Clubbing or edema, No new Rash or bruise   PERTINENT RADIOLOGIC STUDIES: No results found.   PERTINENT LAB RESULTS: CBC: Recent Labs    01/11/21 0105 01/12/21 0121  WBC 11.3* 10.2  HGB 12.6 10.2*  HCT 38.3 31.7*  PLT 217 197   CMET CMP     Component Value Date/Time   NA 138 01/12/2021 0121   K 4.0 01/12/2021 0121   CL 107 01/12/2021 0121   CO2 25 01/12/2021 0121   GLUCOSE 133 (H) 01/12/2021 0121   BUN 11 01/12/2021 0121   CREATININE 0.70 01/12/2021 0121   CALCIUM 8.3 (L) 01/12/2021 0121   PROT 5.9 (L) 01/12/2021 0121   ALBUMIN 2.5 (L) 01/12/2021 0121   AST 11 (L) 01/12/2021 0121   ALT 8 01/12/2021 0121   ALKPHOS 57 01/12/2021 0121   BILITOT 0.8 01/12/2021 0121   GFRNONAA >60 01/12/2021 0121   GFRAA >60 04/06/2019 1535    GFR CrCl cannot be calculated (Unknown ideal weight.). No results for input(s): LIPASE, AMYLASE in the last 72 hours. No  results for input(s): CKTOTAL, CKMB, CKMBINDEX, TROPONINI in the last 72 hours. Invalid input(s): POCBNP No results for input(s): DDIMER in the last 72 hours. No results for input(s): HGBA1C in the last 72 hours. No results for input(s): CHOL, HDL, LDLCALC, TRIG, CHOLHDL, LDLDIRECT in the last 72 hours. Recent Labs    01/10/21 0922  TSH 1.097   No results for  input(s): VITAMINB12, FOLATE, FERRITIN, TIBC, IRON, RETICCTPCT in the last 72 hours. Coags: No results for input(s): INR in the last 72 hours.  Invalid input(s): PT Microbiology: Recent Results (from the past 240 hour(s))  Urine Culture     Status: Abnormal   Collection Time: 01/09/21  9:19 AM   Specimen: Urine, Clean Catch  Result Value Ref Range Status   Specimen Description URINE, CLEAN CATCH  Final   Special Requests   Final    NONE Performed at [facility], [place] [address], [place],  [address]    Culture >=100,000 COLONIES/mL KLEBSIELLA PNEUMONIAE (A)  Final   Report Status 01/12/2021 FINAL  Final   Organism ID, Bacteria KLEBSIELLA PNEUMONIAE (A)  Final      Susceptibility   Klebsiella pneumoniae - MIC*    AMPICILLIN RESISTANT Resistant     CEFAZOLIN <=4 SENSITIVE Sensitive     CEFEPIME <=0.12 SENSITIVE Sensitive     CEFTRIAXONE <=0.25 SENSITIVE Sensitive     CIPROFLOXACIN <=0.25 SENSITIVE Sensitive     GENTAMICIN <=1 SENSITIVE Sensitive     IMIPENEM <=0.25 SENSITIVE Sensitive     NITROFURANTOIN <=16 SENSITIVE Sensitive     TRIMETH/SULFA <=20 SENSITIVE Sensitive     AMPICILLIN/SULBACTAM <=2 SENSITIVE Sensitive     PIP/TAZO <=4 SENSITIVE Sensitive     * >=100,000 COLONIES/mL KLEBSIELLA PNEUMONIAE  Resp Panel by RT-PCR (Flu A&B, Covid) Nasopharyngeal Swab     Status: None   Collection Time: 01/09/21 11:46 AM   Specimen: Nasopharyngeal Swab; Nasopharyngeal(NP) swabs in vial transport medium  Result Value Ref Range Status   SARS Coronavirus 2 by RT PCR NEGATIVE NEGATIVE Final    Comment:  (NOTE) SARS-CoV-2 target nucleic acids are NOT DETECTED.  The SARS-CoV-2 RNA is generally detectable in upper respiratory specimens during the acute phase of infection. The lowest concentration of SARS-CoV-2 viral copies this assay can detect is 138 copies/mL. A negative result does not preclude SARS-Cov-2 infection and should not be used as the sole basis for treatment or other patient management decisions. A negative result may occur with  improper specimen collection/handling, submission of specimen other than nasopharyngeal swab, presence of viral mutation(s) within the areas targeted by this assay, and inadequate number of viral copies(<138 copies/mL). A negative result must be combined with clinical observations, patient history, and epidemiological information. The expected result is Negative.  Fact Sheet for Patients:  EntrepreneurPulse.com.au  Fact Sheet for Healthcare Providers:  IncredibleEmployment.be  This test is no t yet approved or cleared by the [place] FDA and  has been authorized for detection and/or diagnosis of SARS-CoV-2 by FDA under an Emergency Use Authorization (EUA). This EUA will remain  in effect (meaning this test can be used) for the duration of the COVID-19 declaration under Section 564(b)(1) of the Act, 21 U.S.C.section 360bbb-3(b)(1), unless the authorization is terminated  or revoked sooner.       Influenza A by PCR NEGATIVE NEGATIVE Final   Influenza B by PCR NEGATIVE NEGATIVE Final    Comment: (NOTE) The Xpert Xpress SARS-CoV-2/FLU/RSV plus assay is intended as an aid in the diagnosis of influenza from Nasopharyngeal swab specimens and should not be used as a sole basis for treatment. Nasal washings and aspirates are unacceptable for Xpert Xpress SARS-CoV-2/FLU/RSV testing.  Fact Sheet for Patients: EntrepreneurPulse.com.au  Fact Sheet for Healthcare  Providers: IncredibleEmployment.be  This test is not yet approved or cleared by the [place] FDA and has been authorized for detection and/or diagnosis of SARS-CoV-2  by FDA under an Emergency Use Authorization (EUA). This EUA will remain in effect (meaning this test can be used) for the duration of the COVID-19 declaration under Section 564(b)(1) of the Act, 21 U.S.C. section 360bbb-3(b)(1), unless the authorization is terminated or revoked.  Performed at [facility], [place] [address], [place], [place] [address]     FURTHER DISCHARGE INSTRUCTIONS:  Get Medicines reviewed and adjusted: Please take all your medications with you for your next visit with your Primary MD  Laboratory/radiological data: Please request your Primary MD to go over all hospital tests and procedure/radiological results at the follow up, please ask your Primary MD to get all Hospital records sent to his/her office.  In some cases, they will be blood work, cultures and biopsy results pending at the time of your discharge. Please request that your primary care M.D. goes through all the records of your hospital data and follows up on these results.  Also Note the following: If you experience worsening of your admission symptoms, develop shortness of breath, life threatening emergency, suicidal or homicidal thoughts you must seek medical attention immediately by calling 911 or calling your MD immediately  if symptoms less severe.  You must read complete instructions/literature along with all the possible adverse reactions/side effects for all the Medicines you take and that have been prescribed to you. Take any new Medicines after you have completely understood and accpet all the possible adverse reactions/side effects.   Do not drive when taking Pain medications or sleeping medications (Benzodaizepines)  Do not take more than prescribed Pain, Sleep and Anxiety Medications. It is not  advisable to combine anxiety,sleep and pain medications without talking with your primary care practitioner  Special Instructions: If you have smoked or chewed Tobacco  in the last 2 yrs please stop smoking, stop any regular Alcohol  and or any Recreational drug use.  Wear Seat belts while driving.  Please note: You were cared for by a hospitalist during your hospital stay. Once you are discharged, your primary care physician will handle any further medical issues. Please note that NO REFILLS for any discharge medications will be authorized once you are discharged, as it is imperative that you return to your primary care physician (or establish a relationship with a primary care physician if you do not have one) for your post hospital discharge needs so that they can reassess your need for medications and monitor your lab values.  Total Time spent coordinating discharge including counseling, education and face to face time equals 45 minutes.  SignedOren Binet 01/12/2021 9:50 AM

## 2021-01-13 ENCOUNTER — Telehealth: Payer: Self-pay | Admitting: Internal Medicine

## 2021-01-13 DIAGNOSIS — N390 Urinary tract infection, site not specified: Secondary | ICD-10-CM | POA: Diagnosis not present

## 2021-01-13 DIAGNOSIS — I10 Essential (primary) hypertension: Secondary | ICD-10-CM | POA: Diagnosis not present

## 2021-01-13 DIAGNOSIS — Z9181 History of falling: Secondary | ICD-10-CM | POA: Diagnosis not present

## 2021-01-13 DIAGNOSIS — J387 Other diseases of larynx: Secondary | ICD-10-CM | POA: Diagnosis not present

## 2021-01-13 DIAGNOSIS — R1310 Dysphagia, unspecified: Secondary | ICD-10-CM | POA: Diagnosis not present

## 2021-01-13 DIAGNOSIS — R5381 Other malaise: Secondary | ICD-10-CM | POA: Diagnosis not present

## 2021-01-13 DIAGNOSIS — N8111 Cystocele, midline: Secondary | ICD-10-CM | POA: Diagnosis not present

## 2021-01-13 DIAGNOSIS — K580 Irritable bowel syndrome with diarrhea: Secondary | ICD-10-CM | POA: Diagnosis not present

## 2021-01-13 DIAGNOSIS — G9340 Encephalopathy, unspecified: Secondary | ICD-10-CM | POA: Diagnosis not present

## 2021-01-13 DIAGNOSIS — Z0389 Encounter for observation for other suspected diseases and conditions ruled out: Secondary | ICD-10-CM | POA: Diagnosis not present

## 2021-01-16 DIAGNOSIS — G9340 Encephalopathy, unspecified: Secondary | ICD-10-CM | POA: Diagnosis not present

## 2021-01-16 DIAGNOSIS — Z789 Other specified health status: Secondary | ICD-10-CM | POA: Diagnosis not present

## 2021-01-16 DIAGNOSIS — R410 Disorientation, unspecified: Secondary | ICD-10-CM | POA: Diagnosis not present

## 2021-01-16 DIAGNOSIS — G4720 Circadian rhythm sleep disorder, unspecified type: Secondary | ICD-10-CM | POA: Diagnosis not present

## 2021-01-16 DIAGNOSIS — Z9181 History of falling: Secondary | ICD-10-CM | POA: Diagnosis not present

## 2021-01-16 DIAGNOSIS — M6258 Muscle wasting and atrophy, not elsewhere classified, other site: Secondary | ICD-10-CM | POA: Diagnosis not present

## 2021-01-16 DIAGNOSIS — N8111 Cystocele, midline: Secondary | ICD-10-CM | POA: Diagnosis not present

## 2021-01-16 DIAGNOSIS — R1310 Dysphagia, unspecified: Secondary | ICD-10-CM | POA: Diagnosis not present

## 2021-01-16 DIAGNOSIS — R2681 Unsteadiness on feet: Secondary | ICD-10-CM | POA: Diagnosis not present

## 2021-01-16 DIAGNOSIS — N390 Urinary tract infection, site not specified: Secondary | ICD-10-CM | POA: Diagnosis not present

## 2021-01-16 DIAGNOSIS — G9341 Metabolic encephalopathy: Secondary | ICD-10-CM | POA: Diagnosis not present

## 2021-01-16 DIAGNOSIS — J45909 Unspecified asthma, uncomplicated: Secondary | ICD-10-CM | POA: Diagnosis not present

## 2021-01-16 DIAGNOSIS — M170 Bilateral primary osteoarthritis of knee: Secondary | ICD-10-CM | POA: Diagnosis not present

## 2021-01-16 DIAGNOSIS — R2689 Other abnormalities of gait and mobility: Secondary | ICD-10-CM | POA: Diagnosis not present

## 2021-01-16 DIAGNOSIS — M1990 Unspecified osteoarthritis, unspecified site: Secondary | ICD-10-CM | POA: Diagnosis not present

## 2021-01-16 DIAGNOSIS — R5381 Other malaise: Secondary | ICD-10-CM | POA: Diagnosis not present

## 2021-01-16 DIAGNOSIS — I10 Essential (primary) hypertension: Secondary | ICD-10-CM | POA: Diagnosis not present

## 2021-01-16 DIAGNOSIS — Z743 Need for continuous supervision: Secondary | ICD-10-CM | POA: Diagnosis not present

## 2021-01-16 DIAGNOSIS — R6889 Other general symptoms and signs: Secondary | ICD-10-CM | POA: Diagnosis not present

## 2021-01-16 DIAGNOSIS — M6281 Muscle weakness (generalized): Secondary | ICD-10-CM | POA: Diagnosis not present

## 2021-01-16 DIAGNOSIS — K580 Irritable bowel syndrome with diarrhea: Secondary | ICD-10-CM | POA: Diagnosis not present

## 2021-01-16 DIAGNOSIS — R1312 Dysphagia, oropharyngeal phase: Secondary | ICD-10-CM | POA: Diagnosis not present

## 2021-01-16 DIAGNOSIS — I2510 Atherosclerotic heart disease of native coronary artery without angina pectoris: Secondary | ICD-10-CM | POA: Diagnosis not present

## 2021-01-17 DIAGNOSIS — R1310 Dysphagia, unspecified: Secondary | ICD-10-CM | POA: Diagnosis not present

## 2021-01-17 DIAGNOSIS — J45909 Unspecified asthma, uncomplicated: Secondary | ICD-10-CM | POA: Diagnosis not present

## 2021-01-17 DIAGNOSIS — G4720 Circadian rhythm sleep disorder, unspecified type: Secondary | ICD-10-CM | POA: Diagnosis not present

## 2021-01-17 DIAGNOSIS — Z789 Other specified health status: Secondary | ICD-10-CM | POA: Diagnosis not present

## 2021-01-17 DIAGNOSIS — I10 Essential (primary) hypertension: Secondary | ICD-10-CM | POA: Diagnosis not present

## 2021-01-17 DIAGNOSIS — G9340 Encephalopathy, unspecified: Secondary | ICD-10-CM | POA: Diagnosis not present

## 2021-01-17 DIAGNOSIS — N390 Urinary tract infection, site not specified: Secondary | ICD-10-CM | POA: Diagnosis not present

## 2021-01-20 DIAGNOSIS — G9341 Metabolic encephalopathy: Secondary | ICD-10-CM | POA: Diagnosis not present

## 2021-01-20 DIAGNOSIS — J45909 Unspecified asthma, uncomplicated: Secondary | ICD-10-CM | POA: Diagnosis not present

## 2021-01-20 DIAGNOSIS — I2510 Atherosclerotic heart disease of native coronary artery without angina pectoris: Secondary | ICD-10-CM | POA: Diagnosis not present

## 2021-01-20 DIAGNOSIS — I10 Essential (primary) hypertension: Secondary | ICD-10-CM | POA: Diagnosis not present

## 2021-01-20 DIAGNOSIS — M6281 Muscle weakness (generalized): Secondary | ICD-10-CM | POA: Diagnosis not present

## 2021-01-20 DIAGNOSIS — R2681 Unsteadiness on feet: Secondary | ICD-10-CM | POA: Diagnosis not present

## 2021-01-24 ENCOUNTER — Other Ambulatory Visit: Payer: Self-pay | Admitting: Internal Medicine

## 2021-01-24 DIAGNOSIS — K589 Irritable bowel syndrome without diarrhea: Secondary | ICD-10-CM

## 2021-01-25 DIAGNOSIS — I2510 Atherosclerotic heart disease of native coronary artery without angina pectoris: Secondary | ICD-10-CM | POA: Diagnosis not present

## 2021-01-25 DIAGNOSIS — M6281 Muscle weakness (generalized): Secondary | ICD-10-CM | POA: Diagnosis not present

## 2021-01-25 DIAGNOSIS — G9341 Metabolic encephalopathy: Secondary | ICD-10-CM | POA: Diagnosis not present

## 2021-01-25 DIAGNOSIS — R2681 Unsteadiness on feet: Secondary | ICD-10-CM | POA: Diagnosis not present

## 2021-01-25 DIAGNOSIS — J45909 Unspecified asthma, uncomplicated: Secondary | ICD-10-CM | POA: Diagnosis not present

## 2021-01-25 DIAGNOSIS — I10 Essential (primary) hypertension: Secondary | ICD-10-CM | POA: Diagnosis not present

## 2021-01-29 DIAGNOSIS — I10 Essential (primary) hypertension: Secondary | ICD-10-CM | POA: Diagnosis not present

## 2021-01-29 DIAGNOSIS — N390 Urinary tract infection, site not specified: Secondary | ICD-10-CM | POA: Diagnosis not present

## 2021-01-29 DIAGNOSIS — J45909 Unspecified asthma, uncomplicated: Secondary | ICD-10-CM | POA: Diagnosis not present

## 2021-01-29 DIAGNOSIS — G9340 Encephalopathy, unspecified: Secondary | ICD-10-CM | POA: Diagnosis not present

## 2021-01-29 DIAGNOSIS — M1990 Unspecified osteoarthritis, unspecified site: Secondary | ICD-10-CM | POA: Diagnosis not present

## 2021-01-30 DIAGNOSIS — N390 Urinary tract infection, site not specified: Secondary | ICD-10-CM | POA: Diagnosis not present

## 2021-01-30 DIAGNOSIS — M6281 Muscle weakness (generalized): Secondary | ICD-10-CM | POA: Diagnosis not present

## 2021-01-30 DIAGNOSIS — I10 Essential (primary) hypertension: Secondary | ICD-10-CM | POA: Diagnosis not present

## 2021-01-30 DIAGNOSIS — I2510 Atherosclerotic heart disease of native coronary artery without angina pectoris: Secondary | ICD-10-CM | POA: Diagnosis not present

## 2021-01-30 DIAGNOSIS — G4720 Circadian rhythm sleep disorder, unspecified type: Secondary | ICD-10-CM | POA: Diagnosis not present

## 2021-01-30 DIAGNOSIS — M1990 Unspecified osteoarthritis, unspecified site: Secondary | ICD-10-CM | POA: Diagnosis not present

## 2021-01-30 DIAGNOSIS — G9341 Metabolic encephalopathy: Secondary | ICD-10-CM | POA: Diagnosis not present

## 2021-01-30 DIAGNOSIS — J45909 Unspecified asthma, uncomplicated: Secondary | ICD-10-CM | POA: Diagnosis not present

## 2021-01-30 DIAGNOSIS — R2681 Unsteadiness on feet: Secondary | ICD-10-CM | POA: Diagnosis not present

## 2021-01-31 ENCOUNTER — Ambulatory Visit: Payer: Medicare Other | Admitting: Family Medicine

## 2021-01-31 DIAGNOSIS — M170 Bilateral primary osteoarthritis of knee: Secondary | ICD-10-CM | POA: Diagnosis not present

## 2021-01-31 NOTE — Progress Notes (Deleted)
   I, Nichole Silva, LAT, ATC acting as a scribe for Nichole Leader, MD.  Nichole Silva is a 86 y.o. female who presents to [place] at [facility] today for continued R knee pain. Pt was last seen by Dr. Georgina Snell on 11/08/20 and was referred for home health PT. Pt had a R knee aspiration and steroid injection on 09/27/20. Today, pt reports .  Of note, pt had a recent fall and was seen at the [facility] on 01/08/21 and subsequently admitted.  Dx imaging: 09/27/20 R knee XR  Pertinent review of systems: ***  Relevant historical information: ***   Exam:  There were no vitals taken for this visit. General: Well Developed, well nourished, and in no acute distress.   MSK: ***    Lab and Radiology Results No results found for this or any previous visit (from the past 72 hour(s)). No results found.     Assessment and Plan: 86 y.o. female with ***   PDMP not reviewed this encounter. No orders of the defined types were placed in this encounter.  No orders of the defined types were placed in this encounter.    Discussed warning signs or symptoms. Please see discharge instructions. Patient expresses understanding.   ***

## 2021-02-03 NOTE — Progress Notes (Deleted)
   I, Nichole Silva, LAT, ATC, am serving as scribe for Dr. Lynne Silva.  Nichole Silva is a 86 y.o. female who presents to [place] at [facility] today for f/u of R knee pain.  She was last seen by Dr. Georgina Silva on 09/27/20 and had a R knee aspiration/injection.  Since then, the pt has had multiple falls and was most recently admitted to the [facility] on 01/08/21.  Today, pt reports   Diagnostic testing: R knee XR- 09/27/20  Pertinent review of systems: ***  Relevant historical information: ***   Exam:  There were no vitals taken for this visit. General: Well Developed, well nourished, and in no acute distress.   MSK: ***    Lab and Radiology Results No results found for this or any previous visit (from the past 72 hour(s)). No results found.     Assessment and Plan: 86 y.o. female with ***   PDMP not reviewed this encounter. No orders of the defined types were placed in this encounter.  No orders of the defined types were placed in this encounter.    Discussed warning signs or symptoms. Please see discharge instructions. Patient expresses understanding.   ***

## 2021-02-04 ENCOUNTER — Ambulatory Visit: Payer: Medicare Other | Admitting: Family Medicine

## 2021-02-05 DIAGNOSIS — I10 Essential (primary) hypertension: Secondary | ICD-10-CM | POA: Diagnosis not present

## 2021-02-05 DIAGNOSIS — D539 Nutritional anemia, unspecified: Secondary | ICD-10-CM | POA: Diagnosis not present

## 2021-02-06 ENCOUNTER — Encounter: Payer: Self-pay | Admitting: Internal Medicine

## 2021-02-06 ENCOUNTER — Telehealth: Payer: Self-pay | Admitting: Internal Medicine

## 2021-02-06 ENCOUNTER — Other Ambulatory Visit: Payer: Self-pay

## 2021-02-06 ENCOUNTER — Ambulatory Visit (INDEPENDENT_AMBULATORY_CARE_PROVIDER_SITE_OTHER): Payer: Medicare Other | Admitting: Internal Medicine

## 2021-02-06 VITALS — BP 128/72 | HR 97 | Temp 99.1°F | Ht 64.0 in | Wt 130.0 lb

## 2021-02-06 DIAGNOSIS — E559 Vitamin D deficiency, unspecified: Secondary | ICD-10-CM

## 2021-02-06 DIAGNOSIS — F0390 Unspecified dementia without behavioral disturbance: Secondary | ICD-10-CM

## 2021-02-06 DIAGNOSIS — I10 Essential (primary) hypertension: Secondary | ICD-10-CM

## 2021-02-06 DIAGNOSIS — D539 Nutritional anemia, unspecified: Secondary | ICD-10-CM | POA: Diagnosis not present

## 2021-02-06 DIAGNOSIS — E118 Type 2 diabetes mellitus with unspecified complications: Secondary | ICD-10-CM | POA: Diagnosis not present

## 2021-02-06 MED ORDER — DONEPEZIL HCL 5 MG PO TABS: 5.0000 mg | ORAL_TABLET | Freq: Every day | ORAL | 3 refills | Status: DC

## 2021-02-06 NOTE — Telephone Encounter (Signed)
[place] Name: [facility] Agency Name: Moreen Fowler Phone #: [phone] Service Requested: PT Frequency of Visits: 1 week 1 2 week 4 1 week 4

## 2021-02-06 NOTE — Patient Instructions (Signed)
Ok to stop the seroquel  Please take all new medication as prescribed  - the generic aricept 5 mg per day  Please continue all other medications as before, and refills have been done if requested.  Please have the pharmacy call with any other refills you may need.  Please keep your appointments with your specialists as you may have planned  Please see Dr Ronnald Ramp on Oct 6 as you have planned

## 2021-02-06 NOTE — Progress Notes (Signed)
Patient ID: Nichole Silva, female   DOB: 08/24/1934, 86 y.o.   MRN: [id number]        Chief Complaint: follow up recent hospn aug 17 - 21, also s/p rehab stay       HPI:  Nichole Silva is a 86 y.o. female here with confusion likely UTI related and general weakness and debility.  Patient was recently admitted at [facility] from 8/17 to 8/21 for mechanical fall. Per report, patient had multiple falls on the day of admission (once while picking up O2 tank and once after tripping on oxygen tubing). Husband also reported patient seemed "not herself". Patient was noted to have Klebsiella. She was treated with IV ABX then switched to Keflex at discharge.  Patient was discharged with home health PT/OT. She presented to [facility] on 01/12/21 shortly after being discharged from [facility]. Daughters reported patient was continued to be confused and unable to take care of herself at home. Per daughters, patient was not at baseline at discharge and mentation progressively worsened at [facility] UA consistent with infection. Patient was admitted to [facility] for further evaluation and management.  Patient was started on CTX however her her urine culture showed no growth. Blood cultures also showed no growth. CTX was discontinued. Patient has h/o cystocele and rectocele,  Plans to f/u [facility] as outpt for pessary placement.  Patient was noted to have acute metabolic encephalopathy on admission. This was initially believed to be 2/2 UTI but urine culture negative. Vitamin B12, ammonia, and TSH WNL. MRI showed no evidence of acute intracranial abnormality. Mild to moderate atrophy and chronic microvascular ischemic disease. Her symptoms were likely 2/2 hospital-acquired delirium. She was initiated on low-dose nightly Seroquel.  Patient was evaluated by PT/OT who recommended SNF. Patient was discharged to [facility] for post hosp rehab.  Today here with daughter and delerium seems to have resolved, daughter asks to stop the  seroquel.  Also memory seems worsening overall, asks to start med tx, does not want further imaging or neurology for now.  Pt now at home with home PT to start today.  Did see ortho yesterday with bilateral cortisone to knees.  Pt denies chest pain, increased sob or doe, wheezing, orthopnea, PND, increased LE swelling, palpitations, dizziness or syncope.   Pt denies polydipsia, polyuria, or new focal neuro s/s.         Wt Readings from Last 3 Encounters:  02/06/21 130 lb (59 kg)  11/08/20 130 lb 3.2 oz (59.1 kg)  09/27/20 123 lb (55.8 kg)   BP Readings from Last 3 Encounters:  02/06/21 128/72  01/12/21 (!) 166/73  09/27/20 130/80         Past Medical History:  Diagnosis Date   Allergic rhinitis, cause unspecified    Anxiety state, unspecified    Asthma    Coronary atherosclerosis of unspecified type of vessel, native or graft    Depression    Female bladder prolapse    Hyperlipidemia    IBS (irritable bowel syndrome)    Osteoarthrosis, unspecified whether generalized or localized, unspecified site    Type II or unspecified type diabetes mellitus without mention of complication, not stated as uncontrolled    Unspecified chronic bronchitis ([place])    Unspecified essential hypertension    Unspecified hearing loss    Unspecified venous (peripheral) insufficiency    Unspecified vitamin D deficiency    Past Surgical History:  Procedure Laterality Date   Cataract surg  2009   CHOLECYSTECTOMY  1997   DILATION AND CURETTAGE OF UTERUS  1960's   Left elbow surgery  1992    reports that she has never smoked. She has never used smokeless tobacco. She reports that she does not drink alcohol and does not use drugs. family history includes Colon cancer in her sister and another family member; Emphysema in her mother; Heart disease in her father; Pancreatic cancer in an other family member. Allergies  Allergen Reactions   Nifedipine Swelling   Codeine     Unknown per pt    Metformin And  Related     N&V   Morphine And Related    Other Diarrhea    Shelled beans, severe IBS   Penicillins     REACTION: ITCHING AND SWELLING   Current Outpatient Medications on File Prior to Visit  Medication Sig Dispense Refill   acetaminophen (TYLENOL) 325 MG tablet Take 325 mg by mouth every 6 (six) hours as needed for mild pain or moderate pain. Pain/headache     atorvastatin (LIPITOR) 40 MG tablet TAKE 1 TABLET BY MOUTH DAILY AT 6 PM. (Patient taking differently: Take 40 mg by mouth daily.) 90 tablet 1   BREO ELLIPTA 200-25 MCG/INH AEPB INHALE 1 PUFF BY MOUTH EVERY DAY 60 each 3   cetirizine (ZYRTEC) 10 MG tablet Take 10 mg by mouth daily.     dicyclomine (BENTYL) 10 MG capsule Take 1 capsule (10 mg total) by mouth 3 (three) times daily before meals. 270 capsule 1   fluticasone (FLONASE) 50 MCG/ACT nasal spray PLACE 2 SPRAYS INTO BOTH NOSTRILS AT BEDTIME (Patient taking differently: Place 2 sprays into both nostrils at bedtime as needed for allergies.) 48 mL 1   losartan (COZAAR) 50 MG tablet TAKE 1 TABLET BY MOUTH EVERY DAY (Patient taking differently: Take 50 mg by mouth daily.) 90 tablet 1   PROAIR HFA 108 (90 Base) MCG/ACT inhaler INHALE 1 TO 2 PUFFS BY MOUTH EVERY 4 HOURS AS NEEDED FOR WHEEZE OR SHORTNESS OF BREATH (Patient taking differently: Inhale 1-2 puffs into the lungs every 4 (four) hours as needed for wheezing or shortness of breath.) 8.5 Inhaler 11   QUEtiapine (SEROQUEL) 25 MG tablet Take by mouth.     vitamin B-12 (CYANOCOBALAMIN) 1000 MCG tablet Take 1,000 mcg by mouth daily.     Vitamin D, Ergocalciferol, (DRISDOL) 1.25 MG (50000 UNIT) CAPS capsule TAKE 1 CAPSULE BY MOUTH EVERY 7 DAYS (Patient taking differently: Take 50,000 Units by mouth every 7 (seven) days.) 12 capsule 0   No current facility-administered medications on file prior to visit.        ROS:  All others reviewed and negative.  Objective        PE:  BP 128/72 (BP Location: Right Arm, Patient Position:  Sitting, Cuff Size: Normal)   Pulse 97   Temp 99.1 F (37.3 C) (Oral)   Ht '5\' 4"'$  (1.626 m)   Wt 130 lb (59 kg)   SpO2 96%   BMI 22.31 kg/m                 Constitutional: Pt appears in NAD               HENT: Head: NCAT.                Right Ear: External ear normal.                 Left Ear: External ear normal.  Eyes: . Pupils are equal, round, and reactive to light. Conjunctivae and EOM are normal               Nose: without d/c or deformity               Neck: Neck supple. Gross normal ROM               Cardiovascular: Normal rate and regular rhythm.                 Pulmonary/Chest: Effort normal and breath sounds without rales or wheezing.                Abd:  Soft, NT, ND, + BS, no organomegaly               Neurological: Pt is alert. At baseline orientation, motor grossly intact               Skin: Skin is warm. No rashes, no other new lesions, LE edema - none               Psychiatric: Pt behavior is normal without agitation   Micro: none  Cardiac tracings I have personally interpreted today:  none  Pertinent Radiological findings (summarize): none   Lab Results  Component Value Date   WBC 10.2 01/12/2021   HGB 10.2 (L) 01/12/2021   HCT 31.7 (L) 01/12/2021   PLT 197 01/12/2021   GLUCOSE 133 (H) 01/12/2021   CHOL 159 12/06/2019   TRIG 137 12/06/2019   HDL 57 12/06/2019   LDLDIRECT 102.0 07/29/2015   LDLCALC 78 12/06/2019   ALT 8 01/12/2021   AST 11 (L) 01/12/2021   NA 138 01/12/2021   K 4.0 01/12/2021   CL 107 01/12/2021   CREATININE 0.70 01/12/2021   BUN 11 01/12/2021   CO2 25 01/12/2021   TSH 1.097 01/10/2021   HGBA1C 5.7 07/22/2020   MICROALBUR 13.1 12/07/2019   Assessment/Plan:  Elyzah Heldenbrand is a 86 y.o. White or Caucasian [1] female with  has a past medical history of Allergic rhinitis, cause unspecified, Anxiety state, unspecified, Asthma, Coronary atherosclerosis of unspecified type of vessel, native or graft, Depression, Female  bladder prolapse, Hyperlipidemia, IBS (irritable bowel syndrome), Osteoarthrosis, unspecified whether generalized or localized, unspecified site, Type II or unspecified type diabetes mellitus without mention of complication, not stated as uncontrolled, Unspecified chronic bronchitis ([place]), Unspecified essential hypertension, Unspecified hearing loss, Unspecified venous (peripheral) insufficiency, and Unspecified vitamin D deficiency.  Dementia without behavioral disturbance ([place]) Ok for d/c seroquel, stat aricept 5 qd,  to f/u any worsening symptoms or concerns  Vitamin D deficiency Last vitamin D Lab Results  Component Value Date   VD25OH 56 12/06/2019   Stable, cont oral replacement   Type II diabetes mellitus with manifestations ([place]) Lab Results  Component Value Date   HGBA1C 5.7 07/22/2020   Stable, pt to continue current medical treatment   - diet   Essential hypertension BP Readings from Last 3 Encounters:  02/06/21 128/72  01/12/21 (!) 166/73  09/27/20 130/80   Stable, pt to continue medical treatment - losartan 50  Followup: Return in about 3 weeks (around 02/27/2021) for to Dr Ronnald Ramp.  Cathlean Cower, MD 02/12/2021 8:34 PM [facility]

## 2021-02-06 NOTE — Telephone Encounter (Signed)
Verbal orders given via VM 

## 2021-02-12 ENCOUNTER — Encounter: Payer: Self-pay | Admitting: Internal Medicine

## 2021-02-12 DIAGNOSIS — Y92009 Unspecified place in unspecified non-institutional (private) residence as the place of occurrence of the external cause: Secondary | ICD-10-CM | POA: Diagnosis not present

## 2021-02-12 DIAGNOSIS — E440 Moderate protein-calorie malnutrition: Secondary | ICD-10-CM | POA: Diagnosis not present

## 2021-02-12 NOTE — Assessment & Plan Note (Signed)
BP Readings from Last 3 Encounters:  02/06/21 128/72  01/12/21 (!) 166/73  09/27/20 130/80   Stable, pt to continue medical treatment - losartan 50

## 2021-02-12 NOTE — Assessment & Plan Note (Signed)
Lab Results  Component Value Date   HGBA1C 5.7 07/22/2020   Stable, pt to continue current medical treatment   - diet

## 2021-02-12 NOTE — Assessment & Plan Note (Signed)
Last vitamin D Lab Results  Component Value Date   VD25OH 56 12/06/2019   Stable, cont oral replacement

## 2021-02-12 NOTE — Assessment & Plan Note (Signed)
[place] for d/c seroquel, stat aricept 5 qd,  to f/u any worsening symptoms or concerns

## 2021-02-13 DIAGNOSIS — I10 Essential (primary) hypertension: Secondary | ICD-10-CM | POA: Diagnosis not present

## 2021-02-13 DIAGNOSIS — Z9181 History of falling: Secondary | ICD-10-CM | POA: Diagnosis not present

## 2021-02-13 DIAGNOSIS — D649 Anemia, unspecified: Secondary | ICD-10-CM | POA: Diagnosis not present

## 2021-02-13 DIAGNOSIS — K589 Irritable bowel syndrome without diarrhea: Secondary | ICD-10-CM | POA: Diagnosis not present

## 2021-02-13 DIAGNOSIS — K449 Diaphragmatic hernia without obstruction or gangrene: Secondary | ICD-10-CM | POA: Diagnosis not present

## 2021-02-13 DIAGNOSIS — R410 Disorientation, unspecified: Secondary | ICD-10-CM | POA: Diagnosis not present

## 2021-02-13 DIAGNOSIS — G9341 Metabolic encephalopathy: Secondary | ICD-10-CM | POA: Diagnosis not present

## 2021-02-13 DIAGNOSIS — N390 Urinary tract infection, site not specified: Secondary | ICD-10-CM | POA: Diagnosis not present

## 2021-02-13 DIAGNOSIS — J45909 Unspecified asthma, uncomplicated: Secondary | ICD-10-CM | POA: Diagnosis not present

## 2021-02-13 DIAGNOSIS — I2510 Atherosclerotic heart disease of native coronary artery without angina pectoris: Secondary | ICD-10-CM | POA: Diagnosis not present

## 2021-02-13 DIAGNOSIS — G4720 Circadian rhythm sleep disorder, unspecified type: Secondary | ICD-10-CM | POA: Diagnosis not present

## 2021-02-13 DIAGNOSIS — F32A Depression, unspecified: Secondary | ICD-10-CM | POA: Diagnosis not present

## 2021-02-13 DIAGNOSIS — H9190 Unspecified hearing loss, unspecified ear: Secondary | ICD-10-CM | POA: Diagnosis not present

## 2021-02-17 DIAGNOSIS — D649 Anemia, unspecified: Secondary | ICD-10-CM | POA: Diagnosis not present

## 2021-02-17 DIAGNOSIS — H9190 Unspecified hearing loss, unspecified ear: Secondary | ICD-10-CM | POA: Diagnosis not present

## 2021-02-17 DIAGNOSIS — R410 Disorientation, unspecified: Secondary | ICD-10-CM | POA: Diagnosis not present

## 2021-02-17 DIAGNOSIS — K589 Irritable bowel syndrome without diarrhea: Secondary | ICD-10-CM | POA: Diagnosis not present

## 2021-02-17 DIAGNOSIS — N390 Urinary tract infection, site not specified: Secondary | ICD-10-CM | POA: Diagnosis not present

## 2021-02-17 DIAGNOSIS — K449 Diaphragmatic hernia without obstruction or gangrene: Secondary | ICD-10-CM | POA: Diagnosis not present

## 2021-02-17 DIAGNOSIS — Z9181 History of falling: Secondary | ICD-10-CM | POA: Diagnosis not present

## 2021-02-17 DIAGNOSIS — F32A Depression, unspecified: Secondary | ICD-10-CM | POA: Diagnosis not present

## 2021-02-17 DIAGNOSIS — G4720 Circadian rhythm sleep disorder, unspecified type: Secondary | ICD-10-CM | POA: Diagnosis not present

## 2021-02-17 DIAGNOSIS — I10 Essential (primary) hypertension: Secondary | ICD-10-CM | POA: Diagnosis not present

## 2021-02-17 DIAGNOSIS — I2510 Atherosclerotic heart disease of native coronary artery without angina pectoris: Secondary | ICD-10-CM | POA: Diagnosis not present

## 2021-02-17 DIAGNOSIS — G9341 Metabolic encephalopathy: Secondary | ICD-10-CM | POA: Diagnosis not present

## 2021-02-17 DIAGNOSIS — J45909 Unspecified asthma, uncomplicated: Secondary | ICD-10-CM | POA: Diagnosis not present

## 2021-02-19 DIAGNOSIS — F32A Depression, unspecified: Secondary | ICD-10-CM | POA: Diagnosis not present

## 2021-02-19 DIAGNOSIS — G9341 Metabolic encephalopathy: Secondary | ICD-10-CM | POA: Diagnosis not present

## 2021-02-19 DIAGNOSIS — I2510 Atherosclerotic heart disease of native coronary artery without angina pectoris: Secondary | ICD-10-CM | POA: Diagnosis not present

## 2021-02-19 DIAGNOSIS — J45909 Unspecified asthma, uncomplicated: Secondary | ICD-10-CM | POA: Diagnosis not present

## 2021-02-19 DIAGNOSIS — G4720 Circadian rhythm sleep disorder, unspecified type: Secondary | ICD-10-CM | POA: Diagnosis not present

## 2021-02-19 DIAGNOSIS — K589 Irritable bowel syndrome without diarrhea: Secondary | ICD-10-CM | POA: Diagnosis not present

## 2021-02-19 DIAGNOSIS — N390 Urinary tract infection, site not specified: Secondary | ICD-10-CM | POA: Diagnosis not present

## 2021-02-19 DIAGNOSIS — I10 Essential (primary) hypertension: Secondary | ICD-10-CM | POA: Diagnosis not present

## 2021-02-19 DIAGNOSIS — Z9181 History of falling: Secondary | ICD-10-CM | POA: Diagnosis not present

## 2021-02-19 DIAGNOSIS — D649 Anemia, unspecified: Secondary | ICD-10-CM | POA: Diagnosis not present

## 2021-02-19 DIAGNOSIS — K449 Diaphragmatic hernia without obstruction or gangrene: Secondary | ICD-10-CM | POA: Diagnosis not present

## 2021-02-19 DIAGNOSIS — H9190 Unspecified hearing loss, unspecified ear: Secondary | ICD-10-CM | POA: Diagnosis not present

## 2021-02-19 DIAGNOSIS — R410 Disorientation, unspecified: Secondary | ICD-10-CM | POA: Diagnosis not present

## 2021-02-20 DIAGNOSIS — F32A Depression, unspecified: Secondary | ICD-10-CM | POA: Diagnosis not present

## 2021-02-20 DIAGNOSIS — N390 Urinary tract infection, site not specified: Secondary | ICD-10-CM | POA: Diagnosis not present

## 2021-02-20 DIAGNOSIS — H9190 Unspecified hearing loss, unspecified ear: Secondary | ICD-10-CM | POA: Diagnosis not present

## 2021-02-20 DIAGNOSIS — Z9181 History of falling: Secondary | ICD-10-CM | POA: Diagnosis not present

## 2021-02-20 DIAGNOSIS — K589 Irritable bowel syndrome without diarrhea: Secondary | ICD-10-CM | POA: Diagnosis not present

## 2021-02-20 DIAGNOSIS — I10 Essential (primary) hypertension: Secondary | ICD-10-CM | POA: Diagnosis not present

## 2021-02-20 DIAGNOSIS — I2510 Atherosclerotic heart disease of native coronary artery without angina pectoris: Secondary | ICD-10-CM | POA: Diagnosis not present

## 2021-02-20 DIAGNOSIS — K449 Diaphragmatic hernia without obstruction or gangrene: Secondary | ICD-10-CM | POA: Diagnosis not present

## 2021-02-20 DIAGNOSIS — D649 Anemia, unspecified: Secondary | ICD-10-CM | POA: Diagnosis not present

## 2021-02-20 DIAGNOSIS — R410 Disorientation, unspecified: Secondary | ICD-10-CM | POA: Diagnosis not present

## 2021-02-20 DIAGNOSIS — G4720 Circadian rhythm sleep disorder, unspecified type: Secondary | ICD-10-CM | POA: Diagnosis not present

## 2021-02-20 DIAGNOSIS — J45909 Unspecified asthma, uncomplicated: Secondary | ICD-10-CM | POA: Diagnosis not present

## 2021-02-20 DIAGNOSIS — G9341 Metabolic encephalopathy: Secondary | ICD-10-CM | POA: Diagnosis not present

## 2021-02-25 DIAGNOSIS — G4720 Circadian rhythm sleep disorder, unspecified type: Secondary | ICD-10-CM | POA: Diagnosis not present

## 2021-02-25 DIAGNOSIS — Z9181 History of falling: Secondary | ICD-10-CM | POA: Diagnosis not present

## 2021-02-25 DIAGNOSIS — N390 Urinary tract infection, site not specified: Secondary | ICD-10-CM | POA: Diagnosis not present

## 2021-02-25 DIAGNOSIS — K449 Diaphragmatic hernia without obstruction or gangrene: Secondary | ICD-10-CM | POA: Diagnosis not present

## 2021-02-25 DIAGNOSIS — R410 Disorientation, unspecified: Secondary | ICD-10-CM | POA: Diagnosis not present

## 2021-02-25 DIAGNOSIS — D649 Anemia, unspecified: Secondary | ICD-10-CM | POA: Diagnosis not present

## 2021-02-25 DIAGNOSIS — J45909 Unspecified asthma, uncomplicated: Secondary | ICD-10-CM | POA: Diagnosis not present

## 2021-02-25 DIAGNOSIS — K589 Irritable bowel syndrome without diarrhea: Secondary | ICD-10-CM | POA: Diagnosis not present

## 2021-02-25 DIAGNOSIS — I10 Essential (primary) hypertension: Secondary | ICD-10-CM | POA: Diagnosis not present

## 2021-02-25 DIAGNOSIS — G9341 Metabolic encephalopathy: Secondary | ICD-10-CM | POA: Diagnosis not present

## 2021-02-25 DIAGNOSIS — I2510 Atherosclerotic heart disease of native coronary artery without angina pectoris: Secondary | ICD-10-CM | POA: Diagnosis not present

## 2021-02-25 DIAGNOSIS — F32A Depression, unspecified: Secondary | ICD-10-CM | POA: Diagnosis not present

## 2021-02-25 DIAGNOSIS — H9190 Unspecified hearing loss, unspecified ear: Secondary | ICD-10-CM | POA: Diagnosis not present

## 2021-02-27 ENCOUNTER — Encounter: Payer: Self-pay | Admitting: Internal Medicine

## 2021-02-27 ENCOUNTER — Other Ambulatory Visit: Payer: Self-pay

## 2021-02-27 ENCOUNTER — Ambulatory Visit (INDEPENDENT_AMBULATORY_CARE_PROVIDER_SITE_OTHER): Payer: Medicare Other | Admitting: Internal Medicine

## 2021-02-27 VITALS — BP 126/70 | HR 100 | Temp 98.3°F | Ht 64.0 in | Wt 124.0 lb

## 2021-02-27 DIAGNOSIS — E559 Vitamin D deficiency, unspecified: Secondary | ICD-10-CM

## 2021-02-27 DIAGNOSIS — E785 Hyperlipidemia, unspecified: Secondary | ICD-10-CM

## 2021-02-27 DIAGNOSIS — F32A Depression, unspecified: Secondary | ICD-10-CM | POA: Diagnosis not present

## 2021-02-27 DIAGNOSIS — I10 Essential (primary) hypertension: Secondary | ICD-10-CM | POA: Diagnosis not present

## 2021-02-27 DIAGNOSIS — H9190 Unspecified hearing loss, unspecified ear: Secondary | ICD-10-CM | POA: Diagnosis not present

## 2021-02-27 DIAGNOSIS — Z23 Encounter for immunization: Secondary | ICD-10-CM | POA: Insufficient documentation

## 2021-02-27 DIAGNOSIS — D520 Dietary folate deficiency anemia: Secondary | ICD-10-CM

## 2021-02-27 DIAGNOSIS — E118 Type 2 diabetes mellitus with unspecified complications: Secondary | ICD-10-CM

## 2021-02-27 DIAGNOSIS — D539 Nutritional anemia, unspecified: Secondary | ICD-10-CM | POA: Diagnosis not present

## 2021-02-27 DIAGNOSIS — D649 Anemia, unspecified: Secondary | ICD-10-CM | POA: Diagnosis not present

## 2021-02-27 DIAGNOSIS — I2510 Atherosclerotic heart disease of native coronary artery without angina pectoris: Secondary | ICD-10-CM | POA: Diagnosis not present

## 2021-02-27 DIAGNOSIS — Z9181 History of falling: Secondary | ICD-10-CM | POA: Diagnosis not present

## 2021-02-27 DIAGNOSIS — R410 Disorientation, unspecified: Secondary | ICD-10-CM | POA: Diagnosis not present

## 2021-02-27 DIAGNOSIS — G9341 Metabolic encephalopathy: Secondary | ICD-10-CM | POA: Diagnosis not present

## 2021-02-27 DIAGNOSIS — N390 Urinary tract infection, site not specified: Secondary | ICD-10-CM | POA: Diagnosis not present

## 2021-02-27 DIAGNOSIS — K449 Diaphragmatic hernia without obstruction or gangrene: Secondary | ICD-10-CM | POA: Diagnosis not present

## 2021-02-27 DIAGNOSIS — Z0000 Encounter for general adult medical examination without abnormal findings: Secondary | ICD-10-CM

## 2021-02-27 DIAGNOSIS — G4720 Circadian rhythm sleep disorder, unspecified type: Secondary | ICD-10-CM | POA: Diagnosis not present

## 2021-02-27 DIAGNOSIS — J45909 Unspecified asthma, uncomplicated: Secondary | ICD-10-CM | POA: Diagnosis not present

## 2021-02-27 DIAGNOSIS — K589 Irritable bowel syndrome without diarrhea: Secondary | ICD-10-CM | POA: Diagnosis not present

## 2021-02-27 LAB — LIPID PANEL
Cholesterol: 130 mg/dL (ref 0–200)
HDL: 48.9 mg/dL (ref >39.00)
LDL Cholesterol: 58 mg/dL (ref 0–99)
NonHDL: 80.97
Total CHOL/HDL Ratio: 3
Triglycerides: 113 mg/dL (ref 0.0–149.0)
VLDL: 22.6 mg/dL (ref 0.0–40.0)

## 2021-02-27 LAB — CBC WITH DIFFERENTIAL/PLATELET
Basophils Absolute: 0 10*3/uL (ref 0.0–0.1)
Basophils Relative: 0.4 % (ref 0.0–3.0)
Eosinophils Absolute: 0.1 10*3/uL (ref 0.0–0.7)
Eosinophils Relative: 1.2 % (ref 0.0–5.0)
HCT: 37.1 % (ref 36.0–46.0)
Hemoglobin: 12.3 g/dL (ref 12.0–15.0)
Lymphocytes Relative: 21.7 % (ref 12.0–46.0)
Lymphs Abs: 2.3 10*3/uL (ref 0.7–4.0)
MCHC: 33.2 g/dL (ref 30.0–36.0)
MCV: 93.7 fl (ref 78.0–100.0)
Monocytes Absolute: 1 10*3/uL (ref 0.1–1.0)
Monocytes Relative: 9.8 % (ref 3.0–12.0)
Neutro Abs: 7.1 10*3/uL (ref 1.4–7.7)
Neutrophils Relative %: 66.9 % (ref 43.0–77.0)
Platelets: 371 10*3/uL (ref 150.0–400.0)
RBC: 3.96 Mil/uL (ref 3.87–5.11)
RDW: 14.7 % (ref 11.5–15.5)
WBC: 10.6 10*3/uL — ABNORMAL HIGH (ref 4.0–10.5)

## 2021-02-27 LAB — FOLATE: Folate: 5.3 ng/mL — ABNORMAL LOW (ref >5.9)

## 2021-02-27 LAB — VITAMIN D 25 HYDROXY (VIT D DEFICIENCY, FRACTURES): VITD: 54.97 ng/mL (ref 30.00–100.00)

## 2021-02-27 LAB — IBC + FERRITIN
Ferritin: 123 ng/mL (ref 10.0–291.0)
Iron: 60 ug/dL (ref 42–145)
Saturation Ratios: 17.9 % — ABNORMAL LOW (ref 20.0–50.0)
TIBC: 336 ug/dL (ref 250.0–450.0)
Transferrin: 240 mg/dL (ref 212.0–360.0)

## 2021-02-27 LAB — HEMOGLOBIN A1C: Hgb A1c MFr Bld: 6.7 % — ABNORMAL HIGH (ref 4.6–6.5)

## 2021-02-27 LAB — VITAMIN B12: Vitamin B-12: >1550 pg/mL — ABNORMAL HIGH (ref 211–911)

## 2021-02-27 MED ORDER — FOLIC ACID 1 MG PO TABS: 1.0000 mg | ORAL_TABLET | Freq: Every day | ORAL | 1 refills | Status: DC

## 2021-02-27 NOTE — Progress Notes (Signed)
Subjective:  Patient ID: Nichole Silva, female    DOB: May 04, 1935  Age: 86 y.o. MRN: [id number]  CC: Annual Exam, Anemia, and Diabetes  This visit occurred during the SARS-CoV-2 public health emergency.  Safety protocols were in place, including screening questions prior to the visit, additional usage of staff PPE, and extensive cleaning of exam room while observing appropriate contact time as indicated for disinfecting solutions.    HPI Nichole Silva presents for a CPX and f/up -  She was recently admitted for urosepsis and tells me she feels much better though she continues to struggle with LE weakness.  Outpatient Medications Prior to Visit  Medication Sig Dispense Refill   acetaminophen (TYLENOL) 325 MG tablet Take 325 mg by mouth every 6 (six) hours as needed for mild pain or moderate pain. Pain/headache     atorvastatin (LIPITOR) 40 MG tablet TAKE 1 TABLET BY MOUTH DAILY AT 6 PM. (Patient taking differently: Take 40 mg by mouth daily.) 90 tablet 1   BREO ELLIPTA 200-25 MCG/INH AEPB INHALE 1 PUFF BY MOUTH EVERY DAY 60 each 3   cetirizine (ZYRTEC) 10 MG tablet Take 10 mg by mouth daily.     dicyclomine (BENTYL) 10 MG capsule Take 1 capsule (10 mg total) by mouth 3 (three) times daily before meals. 270 capsule 1   donepezil (ARICEPT) 5 MG tablet Take 1 tablet (5 mg total) by mouth at bedtime. 90 tablet 3   fluticasone (FLONASE) 50 MCG/ACT nasal spray PLACE 2 SPRAYS INTO BOTH NOSTRILS AT BEDTIME (Patient taking differently: Place 2 sprays into both nostrils at bedtime as needed for allergies.) 48 mL 1   losartan (COZAAR) 50 MG tablet TAKE 1 TABLET BY MOUTH EVERY DAY (Patient taking differently: Take 50 mg by mouth daily.) 90 tablet 1   PROAIR HFA 108 (90 Base) MCG/ACT inhaler INHALE 1 TO 2 PUFFS BY MOUTH EVERY 4 HOURS AS NEEDED FOR WHEEZE OR SHORTNESS OF BREATH (Patient taking differently: Inhale 1-2 puffs into the lungs every 4 (four) hours as needed for wheezing or shortness of breath.) 8.5  Inhaler 11   vitamin B-12 (CYANOCOBALAMIN) 1000 MCG tablet Take 1,000 mcg by mouth daily.     Vitamin D, Ergocalciferol, (DRISDOL) 1.25 MG (50000 UNIT) CAPS capsule TAKE 1 CAPSULE BY MOUTH EVERY 7 DAYS (Patient taking differently: Take 50,000 Units by mouth every 7 (seven) days.) 12 capsule 0   No facility-administered medications prior to visit.    ROS Review of Systems  Constitutional:  Positive for fatigue. Negative for chills, diaphoresis and fever.  HENT: Negative.    Eyes: Negative.   Respiratory:  Negative for cough, chest tightness, shortness of breath and wheezing.   Cardiovascular:  Negative for chest pain, palpitations and leg swelling.  Gastrointestinal:  Negative for abdominal pain, constipation, diarrhea, nausea and vomiting.  Genitourinary: Negative.  Negative for difficulty urinating, dysuria and hematuria.  Musculoskeletal:  Negative for arthralgias.  Skin: Negative.  Negative for rash.  Neurological:  Positive for weakness. Negative for dizziness and headaches.  Hematological:  Negative for adenopathy. Does not bruise/bleed easily.  Psychiatric/Behavioral:  Positive for confusion and decreased concentration.    Objective:  BP 126/70 (BP Location: Right Arm, Patient Position: Sitting, Cuff Size: Normal)   Pulse 100   Temp 98.3 F (36.8 C) (Oral)   Ht 5\' 4"  (6.256 m)   Wt 124 lb (56.2 kg)   SpO2 96%   BMI 21.28 kg/m   BP Readings from Last 3 Encounters:  02/27/21 126/70  02/06/21 128/72  01/12/21 (!) 166/73    Wt Readings from Last 3 Encounters:  02/27/21 124 lb (56.2 kg)  02/06/21 130 lb (59 kg)  11/08/20 130 lb 3.2 oz (59.1 kg)    Physical Exam Vitals reviewed.  Constitutional:      Appearance: She is not ill-appearing.  HENT:     Nose: Nose normal.     Mouth/Throat:     Mouth: Mucous membranes are moist.  Eyes:     General: No scleral icterus.    Conjunctiva/sclera: Conjunctivae normal.  Cardiovascular:     Rate and Rhythm: Tachycardia  present.     Heart sounds: Murmur heard.  Systolic murmur is present with a grade of 1/6.  No diastolic murmur is present.    No gallop.  Pulmonary:     Breath sounds: No stridor. No wheezing, rhonchi or rales.  Abdominal:     General: Abdomen is flat.     Palpations: There is no mass.     Tenderness: There is no abdominal tenderness. There is no guarding.  Musculoskeletal:     Cervical back: Neck supple.     Right lower leg: No edema.     Left lower leg: No edema.  Lymphadenopathy:     Cervical: No cervical adenopathy.  Skin:    General: Skin is warm and dry.  Neurological:     General: No focal deficit present.     Mental Status: She is alert.  Psychiatric:        Mood and Affect: Mood normal.    Lab Results  Component Value Date   WBC 10.6 (H) 02/27/2021   HGB 12.3 02/27/2021   HCT 37.1 02/27/2021   PLT 371.0 02/27/2021   GLUCOSE 133 (H) 01/12/2021   CHOL 130 02/27/2021   TRIG 113.0 02/27/2021   HDL 48.90 02/27/2021   LDLDIRECT 102.0 07/29/2015   LDLCALC 58 02/27/2021   ALT 8 01/12/2021   AST 11 (L) 01/12/2021   NA 138 01/12/2021   K 4.0 01/12/2021   CL 107 01/12/2021   CREATININE 0.70 01/12/2021   BUN 11 01/12/2021   CO2 25 01/12/2021   TSH 1.097 01/10/2021   HGBA1C 6.7 (H) 02/27/2021   MICROALBUR 13.1 12/07/2019    DG Ribs Unilateral W/Chest Left  Result Date: 01/09/2021 CLINICAL DATA:  Status post fall.  Pain EXAM: LEFT RIBS AND CHEST - 3+ VIEW COMPARISON:  04/06/2019 FINDINGS: No fracture or other bone lesions are seen involving the ribs. There is no evidence of pneumothorax or pleural effusion. Lung volumes are low. There is mild platelike atelectasis in the lung bases. Heart size and mediastinal contours are within normal limits. IMPRESSION: 1. No rib fractures identified. 2. Low lung volumes and bibasilar atelectasis. Electronically Signed   By: Kerby Moors M.D.   On: 01/09/2021 09:49   DG Pelvis 1-2 Views  Result Date: 01/09/2021 CLINICAL DATA:   Fall. EXAM: PELVIS - 1-2 VIEW COMPARISON:  CT abdomen pelvis dated May 27, 2016. FINDINGS: There is no evidence of pelvic fracture or diastasis. No pelvic bone lesions are seen. IMPRESSION: Negative. Electronically Signed   By: Titus Dubin M.D.   On: 01/09/2021 09:48   CT Head Wo Contrast  Result Date: 01/09/2021 CLINICAL DATA:  Fall. EXAM: CT HEAD WITHOUT CONTRAST CT CERVICAL SPINE WITHOUT CONTRAST TECHNIQUE: Multidetector CT imaging of the head and cervical spine was performed following the standard protocol without intravenous contrast. Multiplanar CT image reconstructions of the cervical spine were also generated. COMPARISON:  CT head dated May 27, 2016. FINDINGS: CT HEAD FINDINGS Brain: No evidence of acute infarction, hemorrhage, hydrocephalus, extra-axial collection or mass lesion/mass effect. Stable mild atrophy and chronic microvascular ischemic changes. Vascular: Atherosclerotic vascular calcification of the carotid siphons. No hyperdense vessel. Skull: Normal. Negative for fracture or focal lesion. Sinuses/Orbits: No acute finding. Other: None. CT CERVICAL SPINE FINDINGS Alignment: Normal. Skull base and vertebrae: No acute fracture. No primary bone lesion or focal pathologic process. Soft tissues and spinal canal: No prevertebral fluid or swelling. No visible canal hematoma. Disc levels: Small disc protrusions at C4-C5 and C5-C6. Mild bilateral uncovertebral hypertrophy at C5-C6. Upper chest: Negative. Other: None. IMPRESSION: 1. No acute intracranial abnormality. 2. No acute cervical spine fracture or traumatic listhesis. Electronically Signed   By: Titus Dubin M.D.   On: 01/09/2021 09:24   CT Cervical Spine Wo Contrast  Result Date: 01/09/2021 CLINICAL DATA:  Fall. EXAM: CT HEAD WITHOUT CONTRAST CT CERVICAL SPINE WITHOUT CONTRAST TECHNIQUE: Multidetector CT imaging of the head and cervical spine was performed following the standard protocol without intravenous contrast.  Multiplanar CT image reconstructions of the cervical spine were also generated. COMPARISON:  CT head dated May 27, 2016. FINDINGS: CT HEAD FINDINGS Brain: No evidence of acute infarction, hemorrhage, hydrocephalus, extra-axial collection or mass lesion/mass effect. Stable mild atrophy and chronic microvascular ischemic changes. Vascular: Atherosclerotic vascular calcification of the carotid siphons. No hyperdense vessel. Skull: Normal. Negative for fracture or focal lesion. Sinuses/Orbits: No acute finding. Other: None. CT CERVICAL SPINE FINDINGS Alignment: Normal. Skull base and vertebrae: No acute fracture. No primary bone lesion or focal pathologic process. Soft tissues and spinal canal: No prevertebral fluid or swelling. No visible canal hematoma. Disc levels: Small disc protrusions at C4-C5 and C5-C6. Mild bilateral uncovertebral hypertrophy at C5-C6. Upper chest: Negative. Other: None. IMPRESSION: 1. No acute intracranial abnormality. 2. No acute cervical spine fracture or traumatic listhesis. Electronically Signed   By: Titus Dubin M.D.   On: 01/09/2021 09:24    Assessment & Plan:   Nichole Silva was seen today for annual exam, anemia and diabetes.  Diagnoses and all orders for this visit:  Deficiency anemia- Her H/H are normal now. Will screen for vit deficiencies. -     CBC with Differential/Platelet; Future -     IBC + Ferritin; Future -     Vitamin B12; Future -     Folate; Future -     Folate -     Vitamin B12 -     IBC + Ferritin -     CBC with Differential/Platelet  Type II diabetes mellitus with manifestations ([place])- Her blood sugar is well controlled. -     Hemoglobin A1c; Future -     Hemoglobin A1c  Hyperlipidemia with target LDL less than 100 - LDL goal achieved. Doing well on the statin  -     Lipid panel; Future -     Lipid panel  Vitamin D deficiency- Her Vit D is normal now. -     VITAMIN D 25 Hydroxy (Vit-D Deficiency, Fractures); Future -     VITAMIN D 25 Hydroxy  (Vit-D Deficiency, Fractures)  Routine general medical examination at a health care facility - Exam completed, labs reviewed, vaccines reviewed and updated, no cancer screenings indicated, pt ed material was given.  Need for pneumococcal vaccine -     Pneumococcal polysaccharide vaccine 23-valent greater than or equal to 2yo subcutaneous/IM  Flu vaccine need -     Flu Vaccine QUAD  High Dose(Fluad)  Dietary folate deficiency anemia -     folic acid (FOLVITE) 1 MG tablet; Take 1 tablet (1 mg total) by mouth daily.  I am having Nichole Silva start on folic acid. I am also having her maintain her cetirizine, acetaminophen, ProAir HFA, fluticasone, Breo Ellipta, Vitamin D (Ergocalciferol), atorvastatin, losartan, vitamin B-12, dicyclomine, and donepezil.  Meds ordered this encounter  Medications   folic acid (FOLVITE) 1 MG tablet    Sig: Take 1 tablet (1 mg total) by mouth daily.    Dispense:  90 tablet    Refill:  1      Follow-up: Return in about 3 months (around 05/30/2021).  Nichole Calico, MD

## 2021-02-27 NOTE — Patient Instructions (Signed)
Anemia Anemia is a condition in which there is not enough red blood cells or hemoglobin in the blood. Hemoglobin is a substance in red blood cells that carries oxygen. When you do not have enough red blood cells or hemoglobin (are anemic), your body cannot get enough oxygen and your organs may not work properly. As a result, you may feel very tired or have other problems. What are the causes? Common causes of anemia include: Excessive bleeding. Anemia can be caused by excessive bleeding inside or outside the body, including bleeding from the intestines or from heavy menstrual periods in females. Poor nutrition. Long-lasting (chronic) kidney, thyroid, and liver disease. Bone marrow disorders, spleen problems, and blood disorders. Cancer and treatments for cancer. HIV (human immunodeficiency virus) and AIDS (acquired immunodeficiency syndrome). Infections, medicines, and autoimmune disorders that destroy red blood cells. What are the signs or symptoms? Symptoms of this condition include: Minor weakness. Dizziness. Headache, or difficulties concentrating and sleeping. Heartbeats that feel irregular or faster than normal (palpitations). Shortness of breath, especially with exercise. Pale skin, lips, and nails, or cold hands and feet. Indigestion and nausea. Symptoms may occur suddenly or develop slowly. If your anemia is mild, you may not have symptoms. How is this diagnosed? This condition is diagnosed based on blood tests, your medical history, and a physical exam. In some cases, a test may be needed in which cells are removed from the soft tissue inside of a bone and looked at under a microscope (bone marrow biopsy). Your health care provider may also check your stool (feces) for blood and may do additional testing to look for the cause of your bleeding. Other tests may include: Imaging tests, such as a CT scan or MRI. A procedure to see inside your esophagus and stomach (endoscopy). A  procedure to see inside your colon and rectum (colonoscopy). How is this treated? Treatment for this condition depends on the cause. If you continue to lose a lot of blood, you may need to be treated at a hospital. Treatment may include: Taking supplements of iron, vitamin B12, or folic acid. Taking a hormone medicine (erythropoietin) that can help to stimulate red blood cell growth. Having a blood transfusion. This may be needed if you lose a lot of blood. Making changes to your diet. Having surgery to remove your spleen. Follow these instructions at home: Take over-the-counter and prescription medicines only as told by your health care provider. Take supplements only as told by your health care provider. Follow any diet instructions that you were given by your health care provider. Keep all follow-up visits as told by your health care provider. This is important. Contact a health care provider if: You develop new bleeding anywhere in the body. Get help right away if: You are very weak. You are short of breath. You have pain in your abdomen or chest. You are dizzy or feel faint. You have trouble concentrating. You have bloody stools, black stools, or tarry stools. You vomit repeatedly or you vomit up blood. These symptoms may represent a serious problem that is an emergency. Do not wait to see if the symptoms will go away. Get medical help right away. Call your local emergency services (911 in the [place] Do not drive yourself to the hospital. Summary Anemia is a condition in which you do not have enough red blood cells or enough of a substance in your red blood cells that carries oxygen (hemoglobin). Symptoms may occur suddenly or develop slowly. If your anemia is   mild, you may not have symptoms. This condition is diagnosed with blood tests, a medical history, and a physical exam. Other tests may be needed. Treatment for this condition depends on the cause of the anemia. This  information is not intended to replace advice given to you by your health care provider. Make sure you discuss any questions you have with your health care provider. Document Revised: 04/18/2019 Document Reviewed: 04/18/2019 Elsevier Patient Education  2022 Elsevier Inc.  

## 2021-02-28 ENCOUNTER — Telehealth: Payer: Self-pay | Admitting: Internal Medicine

## 2021-02-28 DIAGNOSIS — K449 Diaphragmatic hernia without obstruction or gangrene: Secondary | ICD-10-CM | POA: Diagnosis not present

## 2021-02-28 DIAGNOSIS — J45909 Unspecified asthma, uncomplicated: Secondary | ICD-10-CM | POA: Diagnosis not present

## 2021-02-28 DIAGNOSIS — Z9181 History of falling: Secondary | ICD-10-CM | POA: Diagnosis not present

## 2021-02-28 DIAGNOSIS — G4720 Circadian rhythm sleep disorder, unspecified type: Secondary | ICD-10-CM | POA: Diagnosis not present

## 2021-02-28 DIAGNOSIS — I2510 Atherosclerotic heart disease of native coronary artery without angina pectoris: Secondary | ICD-10-CM | POA: Diagnosis not present

## 2021-02-28 DIAGNOSIS — H9190 Unspecified hearing loss, unspecified ear: Secondary | ICD-10-CM | POA: Diagnosis not present

## 2021-02-28 DIAGNOSIS — F32A Depression, unspecified: Secondary | ICD-10-CM | POA: Diagnosis not present

## 2021-02-28 DIAGNOSIS — D649 Anemia, unspecified: Secondary | ICD-10-CM | POA: Diagnosis not present

## 2021-02-28 DIAGNOSIS — N390 Urinary tract infection, site not specified: Secondary | ICD-10-CM | POA: Diagnosis not present

## 2021-02-28 DIAGNOSIS — G9341 Metabolic encephalopathy: Secondary | ICD-10-CM | POA: Diagnosis not present

## 2021-02-28 DIAGNOSIS — K589 Irritable bowel syndrome without diarrhea: Secondary | ICD-10-CM | POA: Diagnosis not present

## 2021-02-28 DIAGNOSIS — R410 Disorientation, unspecified: Secondary | ICD-10-CM | POA: Diagnosis not present

## 2021-02-28 DIAGNOSIS — I10 Essential (primary) hypertension: Secondary | ICD-10-CM | POA: Diagnosis not present

## 2021-02-28 NOTE — Telephone Encounter (Signed)
Pt's daughter, Curt Bears, has been informed and expressed understanding.

## 2021-02-28 NOTE — Telephone Encounter (Signed)
Patients daughter calling with concerns  Patient received Pneumococcal Polysaccharide-23 on 02/27/21  But she also reports she had the same vaccine 01/15/21 while inpatient at [facility]

## 2021-03-04 DIAGNOSIS — H9190 Unspecified hearing loss, unspecified ear: Secondary | ICD-10-CM | POA: Diagnosis not present

## 2021-03-04 DIAGNOSIS — G4720 Circadian rhythm sleep disorder, unspecified type: Secondary | ICD-10-CM | POA: Diagnosis not present

## 2021-03-04 DIAGNOSIS — R410 Disorientation, unspecified: Secondary | ICD-10-CM | POA: Diagnosis not present

## 2021-03-04 DIAGNOSIS — D649 Anemia, unspecified: Secondary | ICD-10-CM | POA: Diagnosis not present

## 2021-03-04 DIAGNOSIS — K589 Irritable bowel syndrome without diarrhea: Secondary | ICD-10-CM | POA: Diagnosis not present

## 2021-03-04 DIAGNOSIS — K449 Diaphragmatic hernia without obstruction or gangrene: Secondary | ICD-10-CM | POA: Diagnosis not present

## 2021-03-04 DIAGNOSIS — F32A Depression, unspecified: Secondary | ICD-10-CM | POA: Diagnosis not present

## 2021-03-04 DIAGNOSIS — J45909 Unspecified asthma, uncomplicated: Secondary | ICD-10-CM | POA: Diagnosis not present

## 2021-03-04 DIAGNOSIS — I2510 Atherosclerotic heart disease of native coronary artery without angina pectoris: Secondary | ICD-10-CM | POA: Diagnosis not present

## 2021-03-04 DIAGNOSIS — N390 Urinary tract infection, site not specified: Secondary | ICD-10-CM | POA: Diagnosis not present

## 2021-03-04 DIAGNOSIS — I10 Essential (primary) hypertension: Secondary | ICD-10-CM | POA: Diagnosis not present

## 2021-03-04 DIAGNOSIS — G9341 Metabolic encephalopathy: Secondary | ICD-10-CM | POA: Diagnosis not present

## 2021-03-04 DIAGNOSIS — Z9181 History of falling: Secondary | ICD-10-CM | POA: Diagnosis not present

## 2021-03-07 DIAGNOSIS — N390 Urinary tract infection, site not specified: Secondary | ICD-10-CM | POA: Diagnosis not present

## 2021-03-07 DIAGNOSIS — G9341 Metabolic encephalopathy: Secondary | ICD-10-CM | POA: Diagnosis not present

## 2021-03-07 DIAGNOSIS — J45909 Unspecified asthma, uncomplicated: Secondary | ICD-10-CM | POA: Diagnosis not present

## 2021-03-07 DIAGNOSIS — K589 Irritable bowel syndrome without diarrhea: Secondary | ICD-10-CM | POA: Diagnosis not present

## 2021-03-07 DIAGNOSIS — Z9181 History of falling: Secondary | ICD-10-CM | POA: Diagnosis not present

## 2021-03-07 DIAGNOSIS — R410 Disorientation, unspecified: Secondary | ICD-10-CM | POA: Diagnosis not present

## 2021-03-07 DIAGNOSIS — I2510 Atherosclerotic heart disease of native coronary artery without angina pectoris: Secondary | ICD-10-CM | POA: Diagnosis not present

## 2021-03-07 DIAGNOSIS — D649 Anemia, unspecified: Secondary | ICD-10-CM | POA: Diagnosis not present

## 2021-03-07 DIAGNOSIS — G4720 Circadian rhythm sleep disorder, unspecified type: Secondary | ICD-10-CM | POA: Diagnosis not present

## 2021-03-07 DIAGNOSIS — F32A Depression, unspecified: Secondary | ICD-10-CM | POA: Diagnosis not present

## 2021-03-07 DIAGNOSIS — H9190 Unspecified hearing loss, unspecified ear: Secondary | ICD-10-CM | POA: Diagnosis not present

## 2021-03-07 DIAGNOSIS — K449 Diaphragmatic hernia without obstruction or gangrene: Secondary | ICD-10-CM | POA: Diagnosis not present

## 2021-03-07 DIAGNOSIS — I10 Essential (primary) hypertension: Secondary | ICD-10-CM | POA: Diagnosis not present

## 2021-03-11 DIAGNOSIS — G9341 Metabolic encephalopathy: Secondary | ICD-10-CM | POA: Diagnosis not present

## 2021-03-11 DIAGNOSIS — N390 Urinary tract infection, site not specified: Secondary | ICD-10-CM | POA: Diagnosis not present

## 2021-03-11 DIAGNOSIS — H9190 Unspecified hearing loss, unspecified ear: Secondary | ICD-10-CM | POA: Diagnosis not present

## 2021-03-11 DIAGNOSIS — F32A Depression, unspecified: Secondary | ICD-10-CM | POA: Diagnosis not present

## 2021-03-11 DIAGNOSIS — Z9181 History of falling: Secondary | ICD-10-CM | POA: Diagnosis not present

## 2021-03-11 DIAGNOSIS — R410 Disorientation, unspecified: Secondary | ICD-10-CM | POA: Diagnosis not present

## 2021-03-11 DIAGNOSIS — G4720 Circadian rhythm sleep disorder, unspecified type: Secondary | ICD-10-CM | POA: Diagnosis not present

## 2021-03-11 DIAGNOSIS — I2510 Atherosclerotic heart disease of native coronary artery without angina pectoris: Secondary | ICD-10-CM | POA: Diagnosis not present

## 2021-03-11 DIAGNOSIS — K589 Irritable bowel syndrome without diarrhea: Secondary | ICD-10-CM | POA: Diagnosis not present

## 2021-03-11 DIAGNOSIS — K449 Diaphragmatic hernia without obstruction or gangrene: Secondary | ICD-10-CM | POA: Diagnosis not present

## 2021-03-11 DIAGNOSIS — J45909 Unspecified asthma, uncomplicated: Secondary | ICD-10-CM | POA: Diagnosis not present

## 2021-03-11 DIAGNOSIS — I10 Essential (primary) hypertension: Secondary | ICD-10-CM | POA: Diagnosis not present

## 2021-03-11 DIAGNOSIS — D649 Anemia, unspecified: Secondary | ICD-10-CM | POA: Diagnosis not present

## 2021-03-12 DIAGNOSIS — Z9181 History of falling: Secondary | ICD-10-CM | POA: Diagnosis not present

## 2021-03-12 DIAGNOSIS — K589 Irritable bowel syndrome without diarrhea: Secondary | ICD-10-CM | POA: Diagnosis not present

## 2021-03-12 DIAGNOSIS — H9190 Unspecified hearing loss, unspecified ear: Secondary | ICD-10-CM | POA: Diagnosis not present

## 2021-03-12 DIAGNOSIS — G9341 Metabolic encephalopathy: Secondary | ICD-10-CM | POA: Diagnosis not present

## 2021-03-12 DIAGNOSIS — K449 Diaphragmatic hernia without obstruction or gangrene: Secondary | ICD-10-CM | POA: Diagnosis not present

## 2021-03-12 DIAGNOSIS — R410 Disorientation, unspecified: Secondary | ICD-10-CM | POA: Diagnosis not present

## 2021-03-12 DIAGNOSIS — I2510 Atherosclerotic heart disease of native coronary artery without angina pectoris: Secondary | ICD-10-CM | POA: Diagnosis not present

## 2021-03-12 DIAGNOSIS — G4720 Circadian rhythm sleep disorder, unspecified type: Secondary | ICD-10-CM | POA: Diagnosis not present

## 2021-03-12 DIAGNOSIS — I10 Essential (primary) hypertension: Secondary | ICD-10-CM | POA: Diagnosis not present

## 2021-03-12 DIAGNOSIS — N390 Urinary tract infection, site not specified: Secondary | ICD-10-CM | POA: Diagnosis not present

## 2021-03-12 DIAGNOSIS — J45909 Unspecified asthma, uncomplicated: Secondary | ICD-10-CM | POA: Diagnosis not present

## 2021-03-12 DIAGNOSIS — D649 Anemia, unspecified: Secondary | ICD-10-CM | POA: Diagnosis not present

## 2021-03-12 DIAGNOSIS — F32A Depression, unspecified: Secondary | ICD-10-CM | POA: Diagnosis not present

## 2021-03-14 DIAGNOSIS — E440 Moderate protein-calorie malnutrition: Secondary | ICD-10-CM | POA: Diagnosis not present

## 2021-03-14 DIAGNOSIS — Y92009 Unspecified place in unspecified non-institutional (private) residence as the place of occurrence of the external cause: Secondary | ICD-10-CM | POA: Diagnosis not present

## 2021-03-18 DIAGNOSIS — Z960 Presence of urogenital implants: Secondary | ICD-10-CM | POA: Diagnosis not present

## 2021-03-18 DIAGNOSIS — R350 Frequency of micturition: Secondary | ICD-10-CM | POA: Diagnosis not present

## 2021-03-20 DIAGNOSIS — J45909 Unspecified asthma, uncomplicated: Secondary | ICD-10-CM | POA: Diagnosis not present

## 2021-03-20 DIAGNOSIS — R410 Disorientation, unspecified: Secondary | ICD-10-CM | POA: Diagnosis not present

## 2021-03-20 DIAGNOSIS — N390 Urinary tract infection, site not specified: Secondary | ICD-10-CM | POA: Diagnosis not present

## 2021-03-20 DIAGNOSIS — Z9181 History of falling: Secondary | ICD-10-CM | POA: Diagnosis not present

## 2021-03-20 DIAGNOSIS — G4720 Circadian rhythm sleep disorder, unspecified type: Secondary | ICD-10-CM | POA: Diagnosis not present

## 2021-03-20 DIAGNOSIS — K589 Irritable bowel syndrome without diarrhea: Secondary | ICD-10-CM | POA: Diagnosis not present

## 2021-03-20 DIAGNOSIS — I2510 Atherosclerotic heart disease of native coronary artery without angina pectoris: Secondary | ICD-10-CM | POA: Diagnosis not present

## 2021-03-20 DIAGNOSIS — G9341 Metabolic encephalopathy: Secondary | ICD-10-CM | POA: Diagnosis not present

## 2021-03-20 DIAGNOSIS — F32A Depression, unspecified: Secondary | ICD-10-CM | POA: Diagnosis not present

## 2021-03-20 DIAGNOSIS — I10 Essential (primary) hypertension: Secondary | ICD-10-CM | POA: Diagnosis not present

## 2021-03-20 DIAGNOSIS — K449 Diaphragmatic hernia without obstruction or gangrene: Secondary | ICD-10-CM | POA: Diagnosis not present

## 2021-03-20 DIAGNOSIS — H9190 Unspecified hearing loss, unspecified ear: Secondary | ICD-10-CM | POA: Diagnosis not present

## 2021-03-20 DIAGNOSIS — D649 Anemia, unspecified: Secondary | ICD-10-CM | POA: Diagnosis not present

## 2021-03-25 DIAGNOSIS — K589 Irritable bowel syndrome without diarrhea: Secondary | ICD-10-CM | POA: Diagnosis not present

## 2021-03-25 DIAGNOSIS — J45909 Unspecified asthma, uncomplicated: Secondary | ICD-10-CM | POA: Diagnosis not present

## 2021-03-25 DIAGNOSIS — G4720 Circadian rhythm sleep disorder, unspecified type: Secondary | ICD-10-CM | POA: Diagnosis not present

## 2021-03-25 DIAGNOSIS — F32A Depression, unspecified: Secondary | ICD-10-CM | POA: Diagnosis not present

## 2021-03-25 DIAGNOSIS — K449 Diaphragmatic hernia without obstruction or gangrene: Secondary | ICD-10-CM | POA: Diagnosis not present

## 2021-03-25 DIAGNOSIS — I10 Essential (primary) hypertension: Secondary | ICD-10-CM | POA: Diagnosis not present

## 2021-03-25 DIAGNOSIS — H9190 Unspecified hearing loss, unspecified ear: Secondary | ICD-10-CM | POA: Diagnosis not present

## 2021-03-25 DIAGNOSIS — G9341 Metabolic encephalopathy: Secondary | ICD-10-CM | POA: Diagnosis not present

## 2021-03-25 DIAGNOSIS — R410 Disorientation, unspecified: Secondary | ICD-10-CM | POA: Diagnosis not present

## 2021-03-25 DIAGNOSIS — Z9181 History of falling: Secondary | ICD-10-CM | POA: Diagnosis not present

## 2021-03-25 DIAGNOSIS — N390 Urinary tract infection, site not specified: Secondary | ICD-10-CM | POA: Diagnosis not present

## 2021-03-25 DIAGNOSIS — I2510 Atherosclerotic heart disease of native coronary artery without angina pectoris: Secondary | ICD-10-CM | POA: Diagnosis not present

## 2021-03-25 DIAGNOSIS — D649 Anemia, unspecified: Secondary | ICD-10-CM | POA: Diagnosis not present

## 2021-03-27 ENCOUNTER — Telehealth: Payer: Self-pay | Admitting: Internal Medicine

## 2021-03-27 ENCOUNTER — Other Ambulatory Visit (INDEPENDENT_AMBULATORY_CARE_PROVIDER_SITE_OTHER): Payer: Medicare Other

## 2021-03-27 ENCOUNTER — Other Ambulatory Visit: Payer: Self-pay | Admitting: Internal Medicine

## 2021-03-27 DIAGNOSIS — N390 Urinary tract infection, site not specified: Secondary | ICD-10-CM

## 2021-03-27 LAB — URINALYSIS, ROUTINE W REFLEX MICROSCOPIC
Bilirubin Urine: NEGATIVE
Hgb urine dipstick: NEGATIVE
Ketones, ur: NEGATIVE
Nitrite: NEGATIVE
RBC / HPF: NONE SEEN (ref 0–?)
Specific Gravity, Urine: 1.015 (ref 1.000–1.030)
Total Protein, Urine: NEGATIVE
Urine Glucose: NEGATIVE
Urobilinogen, UA: 0.2 (ref 0.0–1.0)
pH: 6 (ref 5.0–8.0)

## 2021-03-27 MED ORDER — NITROFURANTOIN MONOHYD MACRO 100 MG PO CAPS
100.0000 mg | ORAL_CAPSULE | Freq: Two times a day (BID) | ORAL | 0 refills | Status: AC
Start: 2021-03-27 — End: 2021-04-01

## 2021-03-27 NOTE — Telephone Encounter (Signed)
Pt's daughter aware lab was ordered and picked up specimen cup.

## 2021-03-27 NOTE — Telephone Encounter (Signed)
Patient daughter Curt Bears calling in  Patient treated for UTI 10/25 by Dr. Marvel Plan..treated w/ antibiotic cephALEXin (KEFLEX) 500 MG capsule  Says she does not feel mother is getting better & wants to have her urine rechecked  Would like provider to place lab order for urine sample  Please call [phone]

## 2021-03-28 ENCOUNTER — Telehealth: Payer: Self-pay | Admitting: Internal Medicine

## 2021-03-28 MED ORDER — FLUTICASONE PROPIONATE 50 MCG/ACT NA SUSP: 2.0000 | Freq: Every day | NASAL | 1 refills | Status: DC

## 2021-03-28 NOTE — Telephone Encounter (Signed)
1.Medication Requested: fluticasone (FLONASE) 50 MCG/ACT nasal spray  2. Pharmacy (Name, [place], [facility]): [address], [place]  Phone:  [phone] Fax:  [phone]   3. On Med List: yes  4. Last Visit with PCP: 10.06.22  5. Next visit date with PCP: 01.12.23   Agent: Please be advised that RX refills may take up to 3 business days. We ask that you follow-up with your pharmacy.

## 2021-03-30 LAB — CULTURE, URINE COMPREHENSIVE
MICRO NUMBER:: 12589810
RESULT:: NO GROWTH
SPECIMEN QUALITY:: ADEQUATE

## 2021-04-03 ENCOUNTER — Telehealth: Payer: Self-pay | Admitting: Internal Medicine

## 2021-04-03 DIAGNOSIS — G9341 Metabolic encephalopathy: Secondary | ICD-10-CM | POA: Diagnosis not present

## 2021-04-03 DIAGNOSIS — G4720 Circadian rhythm sleep disorder, unspecified type: Secondary | ICD-10-CM | POA: Diagnosis not present

## 2021-04-03 DIAGNOSIS — N390 Urinary tract infection, site not specified: Secondary | ICD-10-CM | POA: Diagnosis not present

## 2021-04-03 DIAGNOSIS — K449 Diaphragmatic hernia without obstruction or gangrene: Secondary | ICD-10-CM | POA: Diagnosis not present

## 2021-04-03 DIAGNOSIS — D649 Anemia, unspecified: Secondary | ICD-10-CM | POA: Diagnosis not present

## 2021-04-03 DIAGNOSIS — R410 Disorientation, unspecified: Secondary | ICD-10-CM | POA: Diagnosis not present

## 2021-04-03 DIAGNOSIS — F32A Depression, unspecified: Secondary | ICD-10-CM | POA: Diagnosis not present

## 2021-04-03 DIAGNOSIS — I2510 Atherosclerotic heart disease of native coronary artery without angina pectoris: Secondary | ICD-10-CM | POA: Diagnosis not present

## 2021-04-03 DIAGNOSIS — I10 Essential (primary) hypertension: Secondary | ICD-10-CM | POA: Diagnosis not present

## 2021-04-03 DIAGNOSIS — K589 Irritable bowel syndrome without diarrhea: Secondary | ICD-10-CM | POA: Diagnosis not present

## 2021-04-03 DIAGNOSIS — Z9181 History of falling: Secondary | ICD-10-CM | POA: Diagnosis not present

## 2021-04-03 DIAGNOSIS — H9190 Unspecified hearing loss, unspecified ear: Secondary | ICD-10-CM | POA: Diagnosis not present

## 2021-04-03 DIAGNOSIS — J45909 Unspecified asthma, uncomplicated: Secondary | ICD-10-CM | POA: Diagnosis not present

## 2021-04-03 NOTE — Telephone Encounter (Signed)
Patient's daughter Curt Bears requesting urinalysis lab order for patient  Daughter states she wants to make sure patient's uti has cleared up

## 2021-04-04 ENCOUNTER — Other Ambulatory Visit: Payer: Self-pay | Admitting: Internal Medicine

## 2021-04-04 ENCOUNTER — Other Ambulatory Visit (INDEPENDENT_AMBULATORY_CARE_PROVIDER_SITE_OTHER): Payer: Medicare Other

## 2021-04-04 ENCOUNTER — Telehealth: Payer: Self-pay | Admitting: Internal Medicine

## 2021-04-04 DIAGNOSIS — N390 Urinary tract infection, site not specified: Secondary | ICD-10-CM

## 2021-04-04 LAB — URINALYSIS, ROUTINE W REFLEX MICROSCOPIC
Bilirubin Urine: NEGATIVE
Ketones, ur: NEGATIVE
Nitrite: POSITIVE — AB
Specific Gravity, Urine: 1.015 (ref 1.000–1.030)
Urine Glucose: NEGATIVE
Urobilinogen, UA: 0.2 (ref 0.0–1.0)
pH: 6 (ref 5.0–8.0)

## 2021-04-04 NOTE — Telephone Encounter (Signed)
Home Health verbal orders-caller/Agency: Jerlyn Ly  Callback number: [phone]  Requesting OT/PT/Skilled nursing/Social Work/Speech: PT Recertification  Frequency: 1w8

## 2021-04-04 NOTE — Telephone Encounter (Signed)
Verbal orders given via VM 

## 2021-04-04 NOTE — Telephone Encounter (Signed)
Pt's daughter, Curt Bears, has been informed.

## 2021-04-07 LAB — CULTURE, URINE COMPREHENSIVE
MICRO NUMBER:: 12626600
SPECIMEN QUALITY:: ADEQUATE

## 2021-04-09 ENCOUNTER — Other Ambulatory Visit: Payer: Self-pay | Admitting: Internal Medicine

## 2021-04-09 ENCOUNTER — Telehealth: Payer: Self-pay | Admitting: Internal Medicine

## 2021-04-09 DIAGNOSIS — N390 Urinary tract infection, site not specified: Secondary | ICD-10-CM

## 2021-04-09 MED ORDER — LEVOFLOXACIN 250 MG PO TABS: 250.0000 mg | ORAL_TABLET | Freq: Every day | ORAL | 0 refills | Status: AC

## 2021-04-09 NOTE — Telephone Encounter (Signed)
Patients daughter Curt Bears calling in  Says if anyone tries to contact them please call her at either 2 numbers [phone] or [phone]  (See previous message below)

## 2021-04-09 NOTE — Telephone Encounter (Signed)
Patient's [name] calling to check status of urine culture  Informed Nichole Silva patient's culture is positive, Nichole Silva states patient's behavior has been abnormal including irritability  Nichole Silva is requesting a call back to discuss next steps, if call is returned today 11-16, please call at 2pm

## 2021-04-10 DIAGNOSIS — D649 Anemia, unspecified: Secondary | ICD-10-CM | POA: Diagnosis not present

## 2021-04-10 DIAGNOSIS — K449 Diaphragmatic hernia without obstruction or gangrene: Secondary | ICD-10-CM | POA: Diagnosis not present

## 2021-04-10 DIAGNOSIS — Z9181 History of falling: Secondary | ICD-10-CM | POA: Diagnosis not present

## 2021-04-10 DIAGNOSIS — H9190 Unspecified hearing loss, unspecified ear: Secondary | ICD-10-CM | POA: Diagnosis not present

## 2021-04-10 DIAGNOSIS — R410 Disorientation, unspecified: Secondary | ICD-10-CM | POA: Diagnosis not present

## 2021-04-10 DIAGNOSIS — G931 Anoxic brain damage, not elsewhere classified: Secondary | ICD-10-CM | POA: Diagnosis not present

## 2021-04-10 DIAGNOSIS — K589 Irritable bowel syndrome without diarrhea: Secondary | ICD-10-CM | POA: Diagnosis not present

## 2021-04-10 DIAGNOSIS — I10 Essential (primary) hypertension: Secondary | ICD-10-CM | POA: Diagnosis not present

## 2021-04-10 DIAGNOSIS — I2510 Atherosclerotic heart disease of native coronary artery without angina pectoris: Secondary | ICD-10-CM | POA: Diagnosis not present

## 2021-04-10 DIAGNOSIS — N390 Urinary tract infection, site not specified: Secondary | ICD-10-CM | POA: Diagnosis not present

## 2021-04-10 DIAGNOSIS — F32A Depression, unspecified: Secondary | ICD-10-CM | POA: Diagnosis not present

## 2021-04-10 DIAGNOSIS — G4720 Circadian rhythm sleep disorder, unspecified type: Secondary | ICD-10-CM | POA: Diagnosis not present

## 2021-04-10 DIAGNOSIS — G9341 Metabolic encephalopathy: Secondary | ICD-10-CM | POA: Diagnosis not present

## 2021-04-10 DIAGNOSIS — J45909 Unspecified asthma, uncomplicated: Secondary | ICD-10-CM | POA: Diagnosis not present

## 2021-04-14 DIAGNOSIS — E440 Moderate protein-calorie malnutrition: Secondary | ICD-10-CM | POA: Diagnosis not present

## 2021-04-14 DIAGNOSIS — Y92009 Unspecified place in unspecified non-institutional (private) residence as the place of occurrence of the external cause: Secondary | ICD-10-CM | POA: Diagnosis not present

## 2021-04-15 DIAGNOSIS — K589 Irritable bowel syndrome without diarrhea: Secondary | ICD-10-CM | POA: Diagnosis not present

## 2021-04-15 DIAGNOSIS — R410 Disorientation, unspecified: Secondary | ICD-10-CM | POA: Diagnosis not present

## 2021-04-15 DIAGNOSIS — G931 Anoxic brain damage, not elsewhere classified: Secondary | ICD-10-CM | POA: Diagnosis not present

## 2021-04-15 DIAGNOSIS — I10 Essential (primary) hypertension: Secondary | ICD-10-CM | POA: Diagnosis not present

## 2021-04-15 DIAGNOSIS — H9190 Unspecified hearing loss, unspecified ear: Secondary | ICD-10-CM | POA: Diagnosis not present

## 2021-04-15 DIAGNOSIS — N390 Urinary tract infection, site not specified: Secondary | ICD-10-CM | POA: Diagnosis not present

## 2021-04-15 DIAGNOSIS — G4720 Circadian rhythm sleep disorder, unspecified type: Secondary | ICD-10-CM | POA: Diagnosis not present

## 2021-04-15 DIAGNOSIS — Z9181 History of falling: Secondary | ICD-10-CM | POA: Diagnosis not present

## 2021-04-15 DIAGNOSIS — I2510 Atherosclerotic heart disease of native coronary artery without angina pectoris: Secondary | ICD-10-CM | POA: Diagnosis not present

## 2021-04-15 DIAGNOSIS — G9341 Metabolic encephalopathy: Secondary | ICD-10-CM | POA: Diagnosis not present

## 2021-04-15 DIAGNOSIS — D649 Anemia, unspecified: Secondary | ICD-10-CM | POA: Diagnosis not present

## 2021-04-15 DIAGNOSIS — J45909 Unspecified asthma, uncomplicated: Secondary | ICD-10-CM | POA: Diagnosis not present

## 2021-04-15 DIAGNOSIS — K449 Diaphragmatic hernia without obstruction or gangrene: Secondary | ICD-10-CM | POA: Diagnosis not present

## 2021-04-15 DIAGNOSIS — F32A Depression, unspecified: Secondary | ICD-10-CM | POA: Diagnosis not present

## 2021-04-22 DIAGNOSIS — R410 Disorientation, unspecified: Secondary | ICD-10-CM | POA: Diagnosis not present

## 2021-04-22 DIAGNOSIS — I2510 Atherosclerotic heart disease of native coronary artery without angina pectoris: Secondary | ICD-10-CM | POA: Diagnosis not present

## 2021-04-22 DIAGNOSIS — J45909 Unspecified asthma, uncomplicated: Secondary | ICD-10-CM | POA: Diagnosis not present

## 2021-04-22 DIAGNOSIS — D649 Anemia, unspecified: Secondary | ICD-10-CM | POA: Diagnosis not present

## 2021-04-22 DIAGNOSIS — N390 Urinary tract infection, site not specified: Secondary | ICD-10-CM | POA: Diagnosis not present

## 2021-04-22 DIAGNOSIS — K589 Irritable bowel syndrome without diarrhea: Secondary | ICD-10-CM | POA: Diagnosis not present

## 2021-04-22 DIAGNOSIS — F32A Depression, unspecified: Secondary | ICD-10-CM | POA: Diagnosis not present

## 2021-04-22 DIAGNOSIS — G9341 Metabolic encephalopathy: Secondary | ICD-10-CM | POA: Diagnosis not present

## 2021-04-22 DIAGNOSIS — G4720 Circadian rhythm sleep disorder, unspecified type: Secondary | ICD-10-CM | POA: Diagnosis not present

## 2021-04-22 DIAGNOSIS — I10 Essential (primary) hypertension: Secondary | ICD-10-CM | POA: Diagnosis not present

## 2021-04-22 DIAGNOSIS — G931 Anoxic brain damage, not elsewhere classified: Secondary | ICD-10-CM | POA: Diagnosis not present

## 2021-04-22 DIAGNOSIS — Z9181 History of falling: Secondary | ICD-10-CM | POA: Diagnosis not present

## 2021-04-22 DIAGNOSIS — K449 Diaphragmatic hernia without obstruction or gangrene: Secondary | ICD-10-CM | POA: Diagnosis not present

## 2021-04-22 DIAGNOSIS — H9190 Unspecified hearing loss, unspecified ear: Secondary | ICD-10-CM | POA: Diagnosis not present

## 2021-04-24 ENCOUNTER — Telehealth: Payer: Self-pay | Admitting: Internal Medicine

## 2021-04-24 ENCOUNTER — Other Ambulatory Visit: Payer: Self-pay

## 2021-04-24 DIAGNOSIS — E785 Hyperlipidemia, unspecified: Secondary | ICD-10-CM

## 2021-04-24 MED ORDER — ATORVASTATIN CALCIUM 40 MG PO TABS: 40.0000 mg | ORAL_TABLET | Freq: Every day | ORAL | 0 refills | Status: DC

## 2021-04-24 NOTE — Telephone Encounter (Signed)
1.Medication Requested: atorvastatin (LIPITOR) 40 MG tablet  2. Pharmacy (Name, [place], [facility]): [address], [place]  Phone:  [phone] Fax:  [phone]   3. On Med List: yes  4. Last Visit with PCP: 10.06.22  5. Next visit date with PCP: 01.12.23   Agent: Please be advised that RX refills may take up to 3 business days. We ask that you follow-up with your pharmacy.

## 2021-04-24 NOTE — Telephone Encounter (Signed)
Patient daughter calling in  Says mother is still showing signs of lingering UTI.Marland Kitchen wants to know if provider will put another lab order in for a urinalysis  Also is requesting provider submit referral to a specialist since patient continues to have frequent UTIs  Patient had recent MRI done & it showed mild to moderate dementia.Marland Kitchendaughter says she would like referral to [facility] for patient to have in depth memory testing & assessment  Fax # [phone] ATTN: Marjory Lies

## 2021-04-25 ENCOUNTER — Other Ambulatory Visit: Payer: Medicare Other

## 2021-04-29 ENCOUNTER — Other Ambulatory Visit: Payer: Self-pay | Admitting: Internal Medicine

## 2021-04-29 ENCOUNTER — Telehealth: Payer: Self-pay | Admitting: Internal Medicine

## 2021-04-29 DIAGNOSIS — F32A Depression, unspecified: Secondary | ICD-10-CM | POA: Diagnosis not present

## 2021-04-29 DIAGNOSIS — K449 Diaphragmatic hernia without obstruction or gangrene: Secondary | ICD-10-CM | POA: Diagnosis not present

## 2021-04-29 DIAGNOSIS — R410 Disorientation, unspecified: Secondary | ICD-10-CM | POA: Diagnosis not present

## 2021-04-29 DIAGNOSIS — G9341 Metabolic encephalopathy: Secondary | ICD-10-CM | POA: Diagnosis not present

## 2021-04-29 DIAGNOSIS — G931 Anoxic brain damage, not elsewhere classified: Secondary | ICD-10-CM | POA: Diagnosis not present

## 2021-04-29 DIAGNOSIS — Z9181 History of falling: Secondary | ICD-10-CM | POA: Diagnosis not present

## 2021-04-29 DIAGNOSIS — D649 Anemia, unspecified: Secondary | ICD-10-CM | POA: Diagnosis not present

## 2021-04-29 DIAGNOSIS — N3281 Overactive bladder: Secondary | ICD-10-CM

## 2021-04-29 DIAGNOSIS — N390 Urinary tract infection, site not specified: Secondary | ICD-10-CM | POA: Diagnosis not present

## 2021-04-29 DIAGNOSIS — K589 Irritable bowel syndrome without diarrhea: Secondary | ICD-10-CM | POA: Diagnosis not present

## 2021-04-29 DIAGNOSIS — H9190 Unspecified hearing loss, unspecified ear: Secondary | ICD-10-CM | POA: Diagnosis not present

## 2021-04-29 DIAGNOSIS — I2510 Atherosclerotic heart disease of native coronary artery without angina pectoris: Secondary | ICD-10-CM | POA: Diagnosis not present

## 2021-04-29 DIAGNOSIS — I10 Essential (primary) hypertension: Secondary | ICD-10-CM | POA: Diagnosis not present

## 2021-04-29 DIAGNOSIS — G4720 Circadian rhythm sleep disorder, unspecified type: Secondary | ICD-10-CM | POA: Diagnosis not present

## 2021-04-29 DIAGNOSIS — J45909 Unspecified asthma, uncomplicated: Secondary | ICD-10-CM | POA: Diagnosis not present

## 2021-04-29 NOTE — Telephone Encounter (Signed)
Patient daughter called in patient is still experiencing signs of a Uti

## 2021-05-06 DIAGNOSIS — K449 Diaphragmatic hernia without obstruction or gangrene: Secondary | ICD-10-CM | POA: Diagnosis not present

## 2021-05-06 DIAGNOSIS — G931 Anoxic brain damage, not elsewhere classified: Secondary | ICD-10-CM | POA: Diagnosis not present

## 2021-05-06 DIAGNOSIS — K589 Irritable bowel syndrome without diarrhea: Secondary | ICD-10-CM | POA: Diagnosis not present

## 2021-05-06 DIAGNOSIS — Z9181 History of falling: Secondary | ICD-10-CM | POA: Diagnosis not present

## 2021-05-06 DIAGNOSIS — I2510 Atherosclerotic heart disease of native coronary artery without angina pectoris: Secondary | ICD-10-CM | POA: Diagnosis not present

## 2021-05-06 DIAGNOSIS — D649 Anemia, unspecified: Secondary | ICD-10-CM | POA: Diagnosis not present

## 2021-05-06 DIAGNOSIS — R410 Disorientation, unspecified: Secondary | ICD-10-CM | POA: Diagnosis not present

## 2021-05-06 DIAGNOSIS — H9190 Unspecified hearing loss, unspecified ear: Secondary | ICD-10-CM | POA: Diagnosis not present

## 2021-05-06 DIAGNOSIS — G9341 Metabolic encephalopathy: Secondary | ICD-10-CM | POA: Diagnosis not present

## 2021-05-06 DIAGNOSIS — N390 Urinary tract infection, site not specified: Secondary | ICD-10-CM | POA: Diagnosis not present

## 2021-05-06 DIAGNOSIS — G4720 Circadian rhythm sleep disorder, unspecified type: Secondary | ICD-10-CM | POA: Diagnosis not present

## 2021-05-06 DIAGNOSIS — I10 Essential (primary) hypertension: Secondary | ICD-10-CM | POA: Diagnosis not present

## 2021-05-06 DIAGNOSIS — J45909 Unspecified asthma, uncomplicated: Secondary | ICD-10-CM | POA: Diagnosis not present

## 2021-05-06 DIAGNOSIS — F32A Depression, unspecified: Secondary | ICD-10-CM | POA: Diagnosis not present

## 2021-05-13 DIAGNOSIS — G4720 Circadian rhythm sleep disorder, unspecified type: Secondary | ICD-10-CM | POA: Diagnosis not present

## 2021-05-13 DIAGNOSIS — G931 Anoxic brain damage, not elsewhere classified: Secondary | ICD-10-CM | POA: Diagnosis not present

## 2021-05-13 DIAGNOSIS — H9190 Unspecified hearing loss, unspecified ear: Secondary | ICD-10-CM | POA: Diagnosis not present

## 2021-05-13 DIAGNOSIS — F32A Depression, unspecified: Secondary | ICD-10-CM | POA: Diagnosis not present

## 2021-05-13 DIAGNOSIS — Z9181 History of falling: Secondary | ICD-10-CM | POA: Diagnosis not present

## 2021-05-13 DIAGNOSIS — K449 Diaphragmatic hernia without obstruction or gangrene: Secondary | ICD-10-CM | POA: Diagnosis not present

## 2021-05-13 DIAGNOSIS — J45909 Unspecified asthma, uncomplicated: Secondary | ICD-10-CM | POA: Diagnosis not present

## 2021-05-13 DIAGNOSIS — D649 Anemia, unspecified: Secondary | ICD-10-CM | POA: Diagnosis not present

## 2021-05-13 DIAGNOSIS — I10 Essential (primary) hypertension: Secondary | ICD-10-CM | POA: Diagnosis not present

## 2021-05-13 DIAGNOSIS — I2510 Atherosclerotic heart disease of native coronary artery without angina pectoris: Secondary | ICD-10-CM | POA: Diagnosis not present

## 2021-05-13 DIAGNOSIS — K589 Irritable bowel syndrome without diarrhea: Secondary | ICD-10-CM | POA: Diagnosis not present

## 2021-05-13 DIAGNOSIS — N390 Urinary tract infection, site not specified: Secondary | ICD-10-CM | POA: Diagnosis not present

## 2021-05-13 DIAGNOSIS — R410 Disorientation, unspecified: Secondary | ICD-10-CM | POA: Diagnosis not present

## 2021-05-13 DIAGNOSIS — G9341 Metabolic encephalopathy: Secondary | ICD-10-CM | POA: Diagnosis not present

## 2021-05-14 DIAGNOSIS — E440 Moderate protein-calorie malnutrition: Secondary | ICD-10-CM | POA: Diagnosis not present

## 2021-05-14 DIAGNOSIS — Y92009 Unspecified place in unspecified non-institutional (private) residence as the place of occurrence of the external cause: Secondary | ICD-10-CM | POA: Diagnosis not present

## 2021-05-15 ENCOUNTER — Other Ambulatory Visit: Payer: Self-pay

## 2021-05-15 ENCOUNTER — Encounter: Payer: Self-pay | Admitting: Internal Medicine

## 2021-05-15 ENCOUNTER — Ambulatory Visit (INDEPENDENT_AMBULATORY_CARE_PROVIDER_SITE_OTHER): Payer: Medicare Other | Admitting: Internal Medicine

## 2021-05-15 ENCOUNTER — Other Ambulatory Visit: Payer: Medicare Other

## 2021-05-15 VITALS — BP 146/80 | HR 96 | Temp 97.6°F | Ht 64.0 in | Wt 138.0 lb

## 2021-05-15 DIAGNOSIS — N3000 Acute cystitis without hematuria: Secondary | ICD-10-CM | POA: Insufficient documentation

## 2021-05-15 DIAGNOSIS — R300 Dysuria: Secondary | ICD-10-CM | POA: Diagnosis not present

## 2021-05-15 DIAGNOSIS — N390 Urinary tract infection, site not specified: Secondary | ICD-10-CM | POA: Diagnosis not present

## 2021-05-15 LAB — URINALYSIS, ROUTINE W REFLEX MICROSCOPIC
Bilirubin Urine: NEGATIVE
Ketones, ur: NEGATIVE
Nitrite: NEGATIVE
Specific Gravity, Urine: <=1.005 — AB (ref 1.000–1.030)
Total Protein, Urine: NEGATIVE
Urine Glucose: NEGATIVE
Urobilinogen, UA: 0.2 (ref 0.0–1.0)
pH: 6 (ref 5.0–8.0)

## 2021-05-15 MED ORDER — METHENAMINE HIPPURATE 1 G PO TABS: 1.0000 g | ORAL_TABLET | Freq: Two times a day (BID) | ORAL | 1 refills | Status: DC

## 2021-05-15 MED ORDER — SULFAMETHOXAZOLE-TRIMETHOPRIM 800-160 MG PO TABS: 1.0000 | ORAL_TABLET | Freq: Two times a day (BID) | ORAL | 0 refills | Status: AC

## 2021-05-15 NOTE — Patient Instructions (Signed)
Urinary Tract Infection, Adult A urinary tract infection (UTI) is an infection of any part of the urinary tract. The urinary tract includes the kidneys, ureters, bladder, and urethra. These organs make, store, and get rid of urine in the body. An upper UTI affects the ureters and kidneys. A lower UTI affects the bladder and urethra. What are the causes? Most urinary tract infections are caused by bacteria in your genital area around your urethra, where urine leaves your body. These bacteria grow and cause inflammation of your urinary tract. What increases the risk? You are more likely to develop this condition if: You have a urinary catheter that stays in place. You are not able to control when you urinate or have a bowel movement (incontinence). You are female and you: Use a spermicide or diaphragm for birth control. Have low estrogen levels. Are pregnant. You have certain genes that increase your risk. You are sexually active. You take antibiotic medicines. You have a condition that causes your flow of urine to slow down, such as: An enlarged prostate, if you are female. Blockage in your urethra. A kidney stone. A nerve condition that affects your bladder control (neurogenic bladder). Not getting enough to drink, or not urinating often. You have certain medical conditions, such as: Diabetes. A weak disease-fighting system (immunesystem). Sickle cell disease. Gout. Spinal cord injury. What are the signs or symptoms? Symptoms of this condition include: Needing to urinate right away (urgency). Frequent urination. This may include small amounts of urine each time you urinate. Pain or burning with urination. Blood in the urine. Urine that smells bad or unusual. Trouble urinating. Cloudy urine. Vaginal discharge, if you are female. Pain in the abdomen or the lower back. You may also have: Vomiting or a decreased appetite. Confusion. Irritability or tiredness. A fever or  chills. Diarrhea. The first symptom in older adults may be confusion. In some cases, they may not have any symptoms until the infection has worsened. How is this diagnosed? This condition is diagnosed based on your medical history and a physical exam. You may also have other tests, including: Urine tests. Blood tests. Tests for STIs (sexually transmitted infections). If you have had more than one UTI, a cystoscopy or imaging studies may be done to determine the cause of the infections. How is this treated? Treatment for this condition includes: Antibiotic medicine. Over-the-counter medicines to treat discomfort. Drinking enough water to stay hydrated. If you have frequent infections or have other conditions such as a kidney stone, you may need to see a health care provider who specializes in the urinary tract (urologist). In rare cases, urinary tract infections can cause sepsis. Sepsis is a life-threatening condition that occurs when the body responds to an infection. Sepsis is treated in the hospital with IV antibiotics, fluids, and other medicines. Follow these instructions at home: Medicines Take over-the-counter and prescription medicines only as told by your health care provider. If you were prescribed an antibiotic medicine, take it as told by your health care provider. Do not stop using the antibiotic even if you start to feel better. General instructions Make sure you: Empty your bladder often and completely. Do not hold urine for long periods of time. Empty your bladder after sex. Wipe from front to back after urinating or having a bowel movement if you are female. Use each tissue only one time when you wipe. Drink enough fluid to keep your urine pale yellow. Keep all follow-up visits. This is important. Contact a health care provider   if: Your symptoms do not get better after 1-2 days. Your symptoms go away and then return. Get help right away if: You have severe pain in your  back or your lower abdomen. You have a fever or chills. You have nausea or vomiting. Summary A urinary tract infection (UTI) is an infection of any part of the urinary tract, which includes the kidneys, ureters, bladder, and urethra. Most urinary tract infections are caused by bacteria in your genital area. Treatment for this condition often includes antibiotic medicines. If you were prescribed an antibiotic medicine, take it as told by your health care provider. Do not stop using the antibiotic even if you start to feel better. Keep all follow-up visits. This is important. This information is not intended to replace advice given to you by your health care provider. Make sure you discuss any questions you have with your health care provider. Document Revised: 12/22/2019 Document Reviewed: 12/22/2019 Elsevier Patient Education  2022 Elsevier Inc.  

## 2021-05-15 NOTE — Progress Notes (Signed)
Subjective:  Patient ID: Nichole Silva, female    DOB: Jan 07, 1935  Age: 86 y.o. MRN: [id number]  CC: Urinary Tract Infection  This visit occurred during the SARS-CoV-2 public health emergency.  Safety protocols were in place, including screening questions prior to the visit, additional usage of staff PPE, and extensive cleaning of exam room while observing appropriate contact time as indicated for disinfecting solutions.    HPI Nichole Silva presents for f/up -  She has had several UTIs recently.  She feels like she has another UTI.  She complains of dysuria, urgency, and frequency.  Outpatient Medications Prior to Visit  Medication Sig Dispense Refill   acetaminophen (TYLENOL) 325 MG tablet Take 325 mg by mouth every 6 (six) hours as needed for mild pain or moderate pain. Pain/headache     atorvastatin (LIPITOR) 40 MG tablet Take 1 tablet (40 mg total) by mouth daily. 90 tablet 0   BREO ELLIPTA 200-25 MCG/INH AEPB INHALE 1 PUFF BY MOUTH EVERY DAY 60 each 3   cetirizine (ZYRTEC) 10 MG tablet Take 10 mg by mouth daily.     dicyclomine (BENTYL) 10 MG capsule Take 1 capsule (10 mg total) by mouth 3 (three) times daily before meals. 270 capsule 1   donepezil (ARICEPT) 5 MG tablet Take 1 tablet (5 mg total) by mouth at bedtime. 90 tablet 3   fluticasone (FLONASE) 50 MCG/ACT nasal spray Place 2 sprays into both nostrils at bedtime. 48 mL 1   folic acid (FOLVITE) 1 MG tablet Take 1 tablet (1 mg total) by mouth daily. 90 tablet 1   losartan (COZAAR) 50 MG tablet TAKE 1 TABLET BY MOUTH EVERY DAY (Patient taking differently: Take 50 mg by mouth daily.) 90 tablet 1   PROAIR HFA 108 (90 Base) MCG/ACT inhaler INHALE 1 TO 2 PUFFS BY MOUTH EVERY 4 HOURS AS NEEDED FOR WHEEZE OR SHORTNESS OF BREATH (Patient taking differently: Inhale 1-2 puffs into the lungs every 4 (four) hours as needed for wheezing or shortness of breath.) 8.5 Inhaler 11   vitamin B-12 (CYANOCOBALAMIN) 1000 MCG tablet Take 1,000 mcg by  mouth daily.     Vitamin D, Ergocalciferol, (DRISDOL) 1.25 MG (50000 UNIT) CAPS capsule TAKE 1 CAPSULE BY MOUTH EVERY 7 DAYS (Patient taking differently: Take 50,000 Units by mouth every 7 (seven) days.) 12 capsule 0   No facility-administered medications prior to visit.    ROS Review of Systems  Constitutional:  Negative for chills, diaphoresis, fatigue and fever.  HENT: Negative.    Eyes: Negative.   Respiratory:  Negative for cough, chest tightness, shortness of breath and wheezing.   Cardiovascular:  Negative for chest pain, palpitations and leg swelling.  Gastrointestinal:  Negative for abdominal pain, diarrhea, nausea and vomiting.  Endocrine: Negative.   Genitourinary:  Positive for dysuria, frequency and urgency. Negative for decreased urine volume, difficulty urinating, flank pain and hematuria.  Musculoskeletal: Negative.   Skin: Negative.   Neurological: Negative.  Negative for dizziness, weakness and headaches.  Hematological:  Negative for adenopathy. Does not bruise/bleed easily.  Psychiatric/Behavioral: Negative.     Objective:  BP (!) 146/80 (BP Location: Left Arm, Patient Position: Sitting, Cuff Size: Large)    Pulse 96    Temp 97.6 F (36.4 C) (Oral)    Ht 5\' 4"  (1.626 m)    Wt 138 lb (62.6 kg)    SpO2 98%    BMI 23.69 kg/m   BP Readings from Last 3 Encounters:  05/15/21 (!) 146/80  02/27/21 126/70  02/06/21 128/72    Wt Readings from Last 3 Encounters:  05/15/21 138 lb (62.6 kg)  02/27/21 124 lb (56.2 kg)  02/06/21 130 lb (59 kg)    Physical Exam Constitutional:      Appearance: She is not ill-appearing.  HENT:     Nose: Nose normal.  Eyes:     General: No scleral icterus.    Conjunctiva/sclera: Conjunctivae normal.  Cardiovascular:     Rate and Rhythm: Normal rate and regular rhythm.     Heart sounds: Murmur heard.  Systolic murmur is present with a grade of 1/6.    No gallop.  Pulmonary:     Effort: Pulmonary effort is normal.     Breath  sounds: No stridor. No wheezing, rhonchi or rales.  Abdominal:     General: Abdomen is flat.     Palpations: There is no mass.     Tenderness: There is no abdominal tenderness. There is no guarding.     Hernia: No hernia is present.  Musculoskeletal:     Cervical back: Neck supple.  Lymphadenopathy:     Cervical: No cervical adenopathy.  Skin:    General: Skin is warm and dry.  Neurological:     General: No focal deficit present.     Mental Status: She is alert.  Psychiatric:        Mood and Affect: Mood normal.        Behavior: Behavior normal.    Lab Results  Component Value Date   WBC 10.6 (H) 02/27/2021   HGB 12.3 02/27/2021   HCT 37.1 02/27/2021   PLT 371.0 02/27/2021   GLUCOSE 133 (H) 01/12/2021   CHOL 130 02/27/2021   TRIG 113.0 02/27/2021   HDL 48.90 02/27/2021   LDLDIRECT 102.0 07/29/2015   LDLCALC 58 02/27/2021   ALT 8 01/12/2021   AST 11 (L) 01/12/2021   NA 138 01/12/2021   K 4.0 01/12/2021   CL 107 01/12/2021   CREATININE 0.70 01/12/2021   BUN 11 01/12/2021   CO2 25 01/12/2021   TSH 1.097 01/10/2021   HGBA1C 6.7 (H) 02/27/2021   MICROALBUR 13.1 12/07/2019    DG Ribs Unilateral W/Chest Left  Result Date: 01/09/2021 CLINICAL DATA:  Status post fall.  Pain EXAM: LEFT RIBS AND CHEST - 3+ VIEW COMPARISON:  04/06/2019 FINDINGS: No fracture or other bone lesions are seen involving the ribs. There is no evidence of pneumothorax or pleural effusion. Lung volumes are low. There is mild platelike atelectasis in the lung bases. Heart size and mediastinal contours are within normal limits. IMPRESSION: 1. No rib fractures identified. 2. Low lung volumes and bibasilar atelectasis. Electronically Signed   By: Kerby Moors M.D.   On: 01/09/2021 09:49   DG Pelvis 1-2 Views  Result Date: 01/09/2021 CLINICAL DATA:  Fall. EXAM: PELVIS - 1-2 VIEW COMPARISON:  CT abdomen pelvis dated May 27, 2016. FINDINGS: There is no evidence of pelvic fracture or diastasis. No pelvic  bone lesions are seen. IMPRESSION: Negative. Electronically Signed   By: Titus Dubin M.D.   On: 01/09/2021 09:48   CT Head Wo Contrast  Result Date: 01/09/2021 CLINICAL DATA:  Fall. EXAM: CT HEAD WITHOUT CONTRAST CT CERVICAL SPINE WITHOUT CONTRAST TECHNIQUE: Multidetector CT imaging of the head and cervical spine was performed following the standard protocol without intravenous contrast. Multiplanar CT image reconstructions of the cervical spine were also generated. COMPARISON:  CT head dated May 27, 2016. FINDINGS: CT HEAD FINDINGS Brain: No  evidence of acute infarction, hemorrhage, hydrocephalus, extra-axial collection or mass lesion/mass effect. Stable mild atrophy and chronic microvascular ischemic changes. Vascular: Atherosclerotic vascular calcification of the carotid siphons. No hyperdense vessel. Skull: Normal. Negative for fracture or focal lesion. Sinuses/Orbits: No acute finding. Other: None. CT CERVICAL SPINE FINDINGS Alignment: Normal. Skull base and vertebrae: No acute fracture. No primary bone lesion or focal pathologic process. Soft tissues and spinal canal: No prevertebral fluid or swelling. No visible canal hematoma. Disc levels: Small disc protrusions at C4-C5 and C5-C6. Mild bilateral uncovertebral hypertrophy at C5-C6. Upper chest: Negative. Other: None. IMPRESSION: 1. No acute intracranial abnormality. 2. No acute cervical spine fracture or traumatic listhesis. Electronically Signed   By: Titus Dubin M.D.   On: 01/09/2021 09:24   CT Cervical Spine Wo Contrast  Result Date: 01/09/2021 CLINICAL DATA:  Fall. EXAM: CT HEAD WITHOUT CONTRAST CT CERVICAL SPINE WITHOUT CONTRAST TECHNIQUE: Multidetector CT imaging of the head and cervical spine was performed following the standard protocol without intravenous contrast. Multiplanar CT image reconstructions of the cervical spine were also generated. COMPARISON:  CT head dated May 27, 2016. FINDINGS: CT HEAD FINDINGS Brain: No  evidence of acute infarction, hemorrhage, hydrocephalus, extra-axial collection or mass lesion/mass effect. Stable mild atrophy and chronic microvascular ischemic changes. Vascular: Atherosclerotic vascular calcification of the carotid siphons. No hyperdense vessel. Skull: Normal. Negative for fracture or focal lesion. Sinuses/Orbits: No acute finding. Other: None. CT CERVICAL SPINE FINDINGS Alignment: Normal. Skull base and vertebrae: No acute fracture. No primary bone lesion or focal pathologic process. Soft tissues and spinal canal: No prevertebral fluid or swelling. No visible canal hematoma. Disc levels: Small disc protrusions at C4-C5 and C5-C6. Mild bilateral uncovertebral hypertrophy at C5-C6. Upper chest: Negative. Other: None. IMPRESSION: 1. No acute intracranial abnormality. 2. No acute cervical spine fracture or traumatic listhesis. Electronically Signed   By: Titus Dubin M.D.   On: 01/09/2021 09:24    Assessment & Plan:   Marrisa was seen today for urinary tract infection.  Diagnoses and all orders for this visit:  Dysuria -     CULTURE, URINE COMPREHENSIVE; Future -     Urinalysis, Routine w reflex microscopic; Future -     Urinalysis, Routine w reflex microscopic -     CULTURE, URINE COMPREHENSIVE  Recurrent UTI (urinary tract infection) -     methenamine (HIPREX) 1 g tablet; Take 1 tablet (1 g total) by mouth 2 (two) times daily with a meal.  Acute cystitis without hematuria- The urine culture is positive for Klebsiella.  It is sensitive to Bactrim DS. -     sulfamethoxazole-trimethoprim (BACTRIM DS) 800-160 MG tablet; Take 1 tablet by mouth 2 (two) times daily for 5 days.   I am having Nichole Silva start on methenamine and sulfamethoxazole-trimethoprim. I am also having her maintain her cetirizine, acetaminophen, ProAir HFA, Breo Ellipta, Vitamin D (Ergocalciferol), losartan, vitamin B-12, dicyclomine, donepezil, folic acid, fluticasone, and atorvastatin.  Meds ordered this  encounter  Medications   methenamine (HIPREX) 1 g tablet    Sig: Take 1 tablet (1 g total) by mouth 2 (two) times daily with a meal.    Dispense:  180 tablet    Refill:  1   sulfamethoxazole-trimethoprim (BACTRIM DS) 800-160 MG tablet    Sig: Take 1 tablet by mouth 2 (two) times daily for 5 days.    Dispense:  10 tablet    Refill:  0     Follow-up: Return if symptoms worsen or fail to improve.  Scarlette Calico, MD

## 2021-05-17 LAB — CULTURE, URINE COMPREHENSIVE

## 2021-05-20 DIAGNOSIS — I2510 Atherosclerotic heart disease of native coronary artery without angina pectoris: Secondary | ICD-10-CM | POA: Diagnosis not present

## 2021-05-20 DIAGNOSIS — D649 Anemia, unspecified: Secondary | ICD-10-CM | POA: Diagnosis not present

## 2021-05-20 DIAGNOSIS — R410 Disorientation, unspecified: Secondary | ICD-10-CM | POA: Diagnosis not present

## 2021-05-20 DIAGNOSIS — K589 Irritable bowel syndrome without diarrhea: Secondary | ICD-10-CM | POA: Diagnosis not present

## 2021-05-20 DIAGNOSIS — J45909 Unspecified asthma, uncomplicated: Secondary | ICD-10-CM | POA: Diagnosis not present

## 2021-05-20 DIAGNOSIS — G931 Anoxic brain damage, not elsewhere classified: Secondary | ICD-10-CM | POA: Diagnosis not present

## 2021-05-20 DIAGNOSIS — F32A Depression, unspecified: Secondary | ICD-10-CM | POA: Diagnosis not present

## 2021-05-20 DIAGNOSIS — G4720 Circadian rhythm sleep disorder, unspecified type: Secondary | ICD-10-CM | POA: Diagnosis not present

## 2021-05-20 DIAGNOSIS — N390 Urinary tract infection, site not specified: Secondary | ICD-10-CM | POA: Diagnosis not present

## 2021-05-20 DIAGNOSIS — I10 Essential (primary) hypertension: Secondary | ICD-10-CM | POA: Diagnosis not present

## 2021-05-20 DIAGNOSIS — G9341 Metabolic encephalopathy: Secondary | ICD-10-CM | POA: Diagnosis not present

## 2021-05-20 DIAGNOSIS — H9190 Unspecified hearing loss, unspecified ear: Secondary | ICD-10-CM | POA: Diagnosis not present

## 2021-05-20 DIAGNOSIS — Z9181 History of falling: Secondary | ICD-10-CM | POA: Diagnosis not present

## 2021-05-20 DIAGNOSIS — K449 Diaphragmatic hernia without obstruction or gangrene: Secondary | ICD-10-CM | POA: Diagnosis not present

## 2021-05-22 DIAGNOSIS — N390 Urinary tract infection, site not specified: Secondary | ICD-10-CM | POA: Diagnosis not present

## 2021-05-29 DIAGNOSIS — K589 Irritable bowel syndrome without diarrhea: Secondary | ICD-10-CM | POA: Diagnosis not present

## 2021-05-29 DIAGNOSIS — J45909 Unspecified asthma, uncomplicated: Secondary | ICD-10-CM | POA: Diagnosis not present

## 2021-05-29 DIAGNOSIS — Z9181 History of falling: Secondary | ICD-10-CM | POA: Diagnosis not present

## 2021-05-29 DIAGNOSIS — G4720 Circadian rhythm sleep disorder, unspecified type: Secondary | ICD-10-CM | POA: Diagnosis not present

## 2021-05-29 DIAGNOSIS — K449 Diaphragmatic hernia without obstruction or gangrene: Secondary | ICD-10-CM | POA: Diagnosis not present

## 2021-05-29 DIAGNOSIS — N390 Urinary tract infection, site not specified: Secondary | ICD-10-CM | POA: Diagnosis not present

## 2021-05-29 DIAGNOSIS — I10 Essential (primary) hypertension: Secondary | ICD-10-CM | POA: Diagnosis not present

## 2021-05-29 DIAGNOSIS — I2510 Atherosclerotic heart disease of native coronary artery without angina pectoris: Secondary | ICD-10-CM | POA: Diagnosis not present

## 2021-05-29 DIAGNOSIS — G9341 Metabolic encephalopathy: Secondary | ICD-10-CM | POA: Diagnosis not present

## 2021-05-29 DIAGNOSIS — H9190 Unspecified hearing loss, unspecified ear: Secondary | ICD-10-CM | POA: Diagnosis not present

## 2021-05-29 DIAGNOSIS — G931 Anoxic brain damage, not elsewhere classified: Secondary | ICD-10-CM | POA: Diagnosis not present

## 2021-05-29 DIAGNOSIS — D649 Anemia, unspecified: Secondary | ICD-10-CM | POA: Diagnosis not present

## 2021-05-29 DIAGNOSIS — R410 Disorientation, unspecified: Secondary | ICD-10-CM | POA: Diagnosis not present

## 2021-05-29 DIAGNOSIS — F32A Depression, unspecified: Secondary | ICD-10-CM | POA: Diagnosis not present

## 2021-06-05 ENCOUNTER — Ambulatory Visit (INDEPENDENT_AMBULATORY_CARE_PROVIDER_SITE_OTHER): Payer: Medicare Other | Admitting: Internal Medicine

## 2021-06-05 ENCOUNTER — Other Ambulatory Visit: Payer: Self-pay | Admitting: Internal Medicine

## 2021-06-05 ENCOUNTER — Encounter: Payer: Self-pay | Admitting: Internal Medicine

## 2021-06-05 ENCOUNTER — Other Ambulatory Visit: Payer: Self-pay

## 2021-06-05 ENCOUNTER — Telehealth: Payer: Self-pay | Admitting: Internal Medicine

## 2021-06-05 VITALS — BP 136/72 | HR 97 | Temp 98.2°F | Resp 16 | Ht 64.0 in | Wt 137.0 lb

## 2021-06-05 DIAGNOSIS — E559 Vitamin D deficiency, unspecified: Secondary | ICD-10-CM | POA: Diagnosis not present

## 2021-06-05 DIAGNOSIS — Z23 Encounter for immunization: Secondary | ICD-10-CM

## 2021-06-05 DIAGNOSIS — E118 Type 2 diabetes mellitus with unspecified complications: Secondary | ICD-10-CM | POA: Diagnosis not present

## 2021-06-05 DIAGNOSIS — I10 Essential (primary) hypertension: Secondary | ICD-10-CM | POA: Diagnosis not present

## 2021-06-05 LAB — BASIC METABOLIC PANEL
BUN: 22 mg/dL (ref 6–23)
CO2: 27 mEq/L (ref 19–32)
Calcium: 9.9 mg/dL (ref 8.4–10.5)
Chloride: 103 mEq/L (ref 96–112)
Creatinine, Ser: 0.82 mg/dL (ref 0.40–1.20)
GFR: 65.65 mL/min (ref >60.00)
Glucose, Bld: 115 mg/dL — ABNORMAL HIGH (ref 70–99)
Potassium: 4 mEq/L (ref 3.5–5.1)
Sodium: 138 mEq/L (ref 135–145)

## 2021-06-05 LAB — HEMOGLOBIN A1C: Hgb A1c MFr Bld: 6 % (ref 4.6–6.5)

## 2021-06-05 LAB — VITAMIN D 25 HYDROXY (VIT D DEFICIENCY, FRACTURES): VITD: 55.3 ng/mL (ref 30.00–100.00)

## 2021-06-05 NOTE — Patient Instructions (Signed)
Type 2 Diabetes Mellitus, Diagnosis, Adult ?Type 2 diabetes (type 2 diabetes mellitus) is a long-term, or chronic, disease. In type 2 diabetes, one or both of these problems may be present: ?The pancreas does not make enough of a hormone called insulin. ?Cells in the body do not respond properly to the insulin that the body makes (insulin resistance). ?Normally, insulin allows blood sugar (glucose) to enter cells in the body. The cells use glucose for energy. Insulin resistance or lack of insulin causes excess glucose to build up in the blood instead of going into cells. This causes high blood glucose (hyperglycemia).  ?What are the causes? ?The exact cause of type 2 diabetes is not known. ?What increases the risk? ?The following factors may make you more likely to develop this condition: ?Having a family member with type 2 diabetes. ?Being overweight or obese. ?Being inactive (sedentary). ?Having been diagnosed with insulin resistance. ?Having a history of prediabetes, diabetes when you were pregnant (gestational diabetes), or polycystic ovary syndrome (PCOS). ?What are the signs or symptoms? ?In the early stage of this condition, you may not have symptoms. Symptoms develop slowly and may include: ?Increased thirst or hunger. ?Increased urination. ?Unexplained weight loss. ?Tiredness (fatigue) or weakness. ?Vision changes, such as blurry vision. ?Dark patches on the skin. ?How is this diagnosed? ?This condition is diagnosed based on your symptoms, your medical history, a physical exam, and your blood glucose level. Your blood glucose may be checked with one or more of the following blood tests: ?A fasting blood glucose (FBG) test. You will not be allowed to eat (you will fast) for 8 hours or longer before a blood sample is taken. ?A random blood glucose test. This test checks blood glucose at any time of day regardless of when you ate. ?An A1C (hemoglobin A1C) blood test. This test provides information about blood  glucose levels over the previous 2-3 months. ?An oral glucose tolerance test (OGTT). This test measures your blood glucose at two times: ?After fasting. This is your baseline blood glucose level. ?Two hours after drinking a beverage that contains glucose. ?You may be diagnosed with type 2 diabetes if: ?Your fasting blood glucose level is 126 mg/dL (7.0 mmol/L) or higher. ?Your random blood glucose level is 200 mg/dL (11.1 mmol/L) or higher. ?Your A1C level is 6.5% or higher. ?Your oral glucose tolerance test result is higher than 200 mg/dL (11.1 mmol/L). ?These blood tests may be repeated to confirm your diagnosis. ?How is this treated? ?Your treatment may be managed by a specialist called an endocrinologist. Type 2 diabetes may be treated by following instructions from your health care provider about: ?Making dietary and lifestyle changes. These may include: ?Following a personalized nutrition plan that is developed by a registered dietitian. ?Exercising regularly. ?Finding ways to manage stress. ?Checking your blood glucose level as often as told. ?Taking diabetes medicines or insulin daily. This helps to keep your blood glucose levels in the healthy range. ?Taking medicines to help prevent complications from diabetes. Medicines may include: ?Aspirin. ?Medicine to lower cholesterol. ?Medicine to control blood pressure. ?Your health care provider will set treatment goals for you. Your goals will be based on your age, other medical conditions you have, and how you respond to diabetes treatment. Generally, the goal of treatment is to maintain the following blood glucose levels: ?Before meals: 80-130 mg/dL (4.4-7.2 mmol/L). ?After meals: below 180 mg/dL (10 mmol/L). ?A1C level: less than 7%. ?Follow these instructions at home: ?Questions to ask your health care provider ?  Consider asking the following questions: ?Should I meet with a certified diabetes care and education specialist? ?What diabetes medicines do I need,  and when should I take them? ?What equipment will I need to manage my diabetes at home? ?How often do I need to check my blood glucose? ?Where can I find a support group for people with diabetes? ?What number can I call if I have questions? ?When is my next appointment? ?General instructions ?Take over-the-counter and prescription medicines only as told by your health care provider. ?Keep all follow-up visits. This is important. ?Where to find more information ?For help and guidance and for more information about diabetes, please visit: ?American Diabetes Association (ADA): www.diabetes.org ?American Association of Diabetes Care and Education Specialists (ADCES): www.diabeteseducator.org ?International Diabetes Federation (IDF): www.idf.org ?Contact a health care provider if: ?Your blood glucose is at or above 240 mg/dL (13.3 mmol/L) for 2 days in a row. ?You have been sick or have had a fever for 2 days or longer, and you are not getting better. ?You have any of the following problems for more than 6 hours: ?You cannot eat or drink. ?You have nausea and vomiting. ?You have diarrhea. ?Get help right away if: ?You have severe hypoglycemia. This means your blood glucose is lower than 54 mg/dL (3.0 mmol/L). ?You become confused or you have trouble thinking clearly. ?You have difficulty breathing. ?You have moderate or large ketone levels in your urine. ?These symptoms may represent a serious problem that is an emergency. Do not wait to see if the symptoms will go away. Get medical help right away. Call your local emergency services (911 in the [place] Do not drive yourself to the hospital. ?Summary ?Type 2 diabetes mellitus is a long-term, or chronic, disease. In type 2 diabetes, the pancreas does not make enough of a hormone called insulin, or cells in the body do not respond properly to insulin that the body makes. ?This condition is treated by making dietary and lifestyle changes and taking diabetes medicines or  insulin. ?Your health care provider will set treatment goals for you. Your goals will be based on your age, other medical conditions you have, and how you respond to diabetes treatment. ?Keep all follow-up visits. This is important. ?This information is not intended to replace advice given to you by your health care provider. Make sure you discuss any questions you have with your health care provider. ?Document Revised: 08/05/2020 Document Reviewed: 08/05/2020 ?Elsevier Patient Education ? 2022 Elsevier Inc. ? ?

## 2021-06-05 NOTE — Telephone Encounter (Signed)
1.Medication Requested: Vitamin D, Ergocalciferol, (DRISDOL) 1.25 MG (50000 UNIT) CAPS capsule  2. Pharmacy (Name, [place]):  [place], [place] Phone:  [phone]  Fax:  [phone]      3. On Med List: yes  4. Last Visit with PCP: 12.22.22  5. Next visit date with PCP: 01.12.23   Agent: Please be advised that RX refills may take up to 3 business days. We ask that you follow-up with your pharmacy.

## 2021-06-05 NOTE — Progress Notes (Signed)
Subjective:  Patient ID: Nichole Silva, female    DOB: [birth year]  Age: 87 y.o. MRN: [id number]  CC: Hypertension and Diabetes  This visit occurred during the SARS-CoV-2 public health emergency.  Safety protocols were in place, including screening questions prior to the visit, additional usage of staff PPE, and extensive cleaning of exam room while observing appropriate contact time as indicated for disinfecting solutions.    HPI Danine Seals presents for f/up -  Since I last saw her she has seen a urologist and she tells me she is taking daily nitrofurantoin.  She feels well today and offers no complaints.  Outpatient Medications Prior to Visit  Medication Sig Dispense Refill   acetaminophen (TYLENOL) 325 MG tablet Take 325 mg by mouth every 6 (six) hours as needed for mild pain or moderate pain. Pain/headache     atorvastatin (LIPITOR) 40 MG tablet Take 1 tablet (40 mg total) by mouth daily. 90 tablet 0   BREO ELLIPTA 200-25 MCG/INH AEPB INHALE 1 PUFF BY MOUTH EVERY DAY 60 each 3   cetirizine (ZYRTEC) 10 MG tablet Take 10 mg by mouth daily.     dicyclomine (BENTYL) 10 MG capsule Take 1 capsule (10 mg total) by mouth 3 (three) times daily before meals. 270 capsule 1   donepezil (ARICEPT) 5 MG tablet Take 1 tablet (5 mg total) by mouth at bedtime. 90 tablet 3   fluticasone (FLONASE) 50 MCG/ACT nasal spray Place 2 sprays into both nostrils at bedtime. 48 mL 1   folic acid (FOLVITE) 1 MG tablet Take 1 tablet (1 mg total) by mouth daily. 90 tablet 1   losartan (COZAAR) 50 MG tablet TAKE 1 TABLET BY MOUTH EVERY DAY (Patient taking differently: Take 50 mg by mouth daily.) 90 tablet 1   methenamine (HIPREX) 1 g tablet Take 1 tablet (1 g total) by mouth 2 (two) times daily with a meal. 180 tablet 1   nitrofurantoin, macrocrystal-monohydrate, (MACROBID) 100 MG capsule Take 100 mg by mouth 2 (two) times daily.     PROAIR HFA 108 (90 Base) MCG/ACT inhaler INHALE 1 TO 2 PUFFS BY MOUTH EVERY 4 HOURS AS  NEEDED FOR WHEEZE OR SHORTNESS OF BREATH (Patient taking differently: Inhale 1-2 puffs into the lungs every 4 (four) hours as needed for wheezing or shortness of breath.) 8.5 Inhaler 11   vitamin B-12 (CYANOCOBALAMIN) 1000 MCG tablet Take 1,000 mcg by mouth daily.     Vitamin D, Ergocalciferol, (DRISDOL) 1.25 MG (50000 UNIT) CAPS capsule TAKE 1 CAPSULE BY MOUTH EVERY 7 DAYS (Patient taking differently: Take 50,000 Units by mouth every 7 (seven) days.) 12 capsule 0   No facility-administered medications prior to visit.    ROS Review of Systems  Constitutional:  Negative for chills, diaphoresis, fatigue and fever.  HENT: Negative.    Eyes: Negative.   Respiratory:  Negative for cough, chest tightness and wheezing.   Cardiovascular:  Negative for chest pain and leg swelling.  Gastrointestinal:  Negative for abdominal pain, diarrhea and nausea.  Endocrine: Negative.   Genitourinary: Negative.  Negative for difficulty urinating, dysuria and hematuria.  Musculoskeletal: Negative.   Skin: Negative.   Neurological:  Negative for dizziness.  Hematological:  Negative for adenopathy. Does not bruise/bleed easily.  Psychiatric/Behavioral: Negative.     Objective:  BP 136/72 (BP Location: Left Arm, Patient Position: Sitting, Cuff Size: Large)    Pulse 97    Temp 98.2 F (36.8 C) (Oral)    Resp 16    Ht  5\' 4"  (1.626 m)    Wt 137 lb (62.1 kg)    SpO2 98%    BMI 23.52 kg/m   BP Readings from Last 3 Encounters:  06/05/21 136/72  05/15/21 (!) 146/80  02/27/21 126/70    Wt Readings from Last 3 Encounters:  06/05/21 137 lb (62.1 kg)  05/15/21 138 lb (62.6 kg)  02/27/21 124 lb (56.2 kg)    Physical Exam Vitals reviewed.  HENT:     Nose: Nose normal.     Mouth/Throat:     Mouth: Mucous membranes are moist.  Eyes:     General: No scleral icterus.    Conjunctiva/sclera: Conjunctivae normal.  Cardiovascular:     Rate and Rhythm: Normal rate and regular rhythm.     Heart sounds: Murmur  heard.    No friction rub. No gallop.  Pulmonary:     Effort: Pulmonary effort is normal.     Breath sounds: No stridor. No wheezing, rhonchi or rales.  Abdominal:     General: Abdomen is flat.     Palpations: There is no mass.     Tenderness: There is no abdominal tenderness. There is no guarding.     Hernia: No hernia is present.  Musculoskeletal:        General: Normal range of motion.     Cervical back: Neck supple.     Right lower leg: No edema.     Left lower leg: No edema.  Lymphadenopathy:     Cervical: No cervical adenopathy.  Skin:    General: Skin is warm and dry.  Neurological:     General: No focal deficit present.     Mental Status: She is alert.    Lab Results  Component Value Date   WBC 10.6 (H) 02/27/2021   HGB 12.3 02/27/2021   HCT 37.1 02/27/2021   PLT 371.0 02/27/2021   GLUCOSE 115 (H) 06/05/2021   CHOL 130 02/27/2021   TRIG 113.0 02/27/2021   HDL 48.90 02/27/2021   LDLDIRECT 102.0 07/29/2015   LDLCALC 58 02/27/2021   ALT 8 01/12/2021   AST 11 (L) 01/12/2021   NA 138 06/05/2021   K 4.0 06/05/2021   CL 103 06/05/2021   CREATININE 0.82 06/05/2021   BUN 22 06/05/2021   CO2 27 06/05/2021   TSH 1.097 01/10/2021   HGBA1C 6.0 06/05/2021   MICROALBUR 13.1 12/07/2019    DG Ribs Unilateral W/Chest Left  Result Date: 01/09/2021 CLINICAL DATA:  Status post fall.  Pain EXAM: LEFT RIBS AND CHEST - 3+ VIEW COMPARISON:  04/06/2019 FINDINGS: No fracture or other bone lesions are seen involving the ribs. There is no evidence of pneumothorax or pleural effusion. Lung volumes are low. There is mild platelike atelectasis in the lung bases. Heart size and mediastinal contours are within normal limits. IMPRESSION: 1. No rib fractures identified. 2. Low lung volumes and bibasilar atelectasis. Electronically Signed   By: Kerby Moors M.D.   On: 01/09/2021 09:49   DG Pelvis 1-2 Views  Result Date: 01/09/2021 CLINICAL DATA:  Fall. EXAM: PELVIS - 1-2 VIEW COMPARISON:   CT abdomen pelvis dated May 27, 2016. FINDINGS: There is no evidence of pelvic fracture or diastasis. No pelvic bone lesions are seen. IMPRESSION: Negative. Electronically Signed   By: Titus Dubin M.D.   On: 01/09/2021 09:48   CT Head Wo Contrast  Result Date: 01/09/2021 CLINICAL DATA:  Fall. EXAM: CT HEAD WITHOUT CONTRAST CT CERVICAL SPINE WITHOUT CONTRAST TECHNIQUE: Multidetector CT imaging of  the head and cervical spine was performed following the standard protocol without intravenous contrast. Multiplanar CT image reconstructions of the cervical spine were also generated. COMPARISON:  CT head dated May 27, 2016. FINDINGS: CT HEAD FINDINGS Brain: No evidence of acute infarction, hemorrhage, hydrocephalus, extra-axial collection or mass lesion/mass effect. Stable mild atrophy and chronic microvascular ischemic changes. Vascular: Atherosclerotic vascular calcification of the carotid siphons. No hyperdense vessel. Skull: Normal. Negative for fracture or focal lesion. Sinuses/Orbits: No acute finding. Other: None. CT CERVICAL SPINE FINDINGS Alignment: Normal. Skull base and vertebrae: No acute fracture. No primary bone lesion or focal pathologic process. Soft tissues and spinal canal: No prevertebral fluid or swelling. No visible canal hematoma. Disc levels: Small disc protrusions at C4-C5 and C5-C6. Mild bilateral uncovertebral hypertrophy at C5-C6. Upper chest: Negative. Other: None. IMPRESSION: 1. No acute intracranial abnormality. 2. No acute cervical spine fracture or traumatic listhesis. Electronically Signed   By: Titus Dubin M.D.   On: 01/09/2021 09:24   CT Cervical Spine Wo Contrast  Result Date: 01/09/2021 CLINICAL DATA:  Fall. EXAM: CT HEAD WITHOUT CONTRAST CT CERVICAL SPINE WITHOUT CONTRAST TECHNIQUE: Multidetector CT imaging of the head and cervical spine was performed following the standard protocol without intravenous contrast. Multiplanar CT image reconstructions of the cervical  spine were also generated. COMPARISON:  CT head dated May 27, 2016. FINDINGS: CT HEAD FINDINGS Brain: No evidence of acute infarction, hemorrhage, hydrocephalus, extra-axial collection or mass lesion/mass effect. Stable mild atrophy and chronic microvascular ischemic changes. Vascular: Atherosclerotic vascular calcification of the carotid siphons. No hyperdense vessel. Skull: Normal. Negative for fracture or focal lesion. Sinuses/Orbits: No acute finding. Other: None. CT CERVICAL SPINE FINDINGS Alignment: Normal. Skull base and vertebrae: No acute fracture. No primary bone lesion or focal pathologic process. Soft tissues and spinal canal: No prevertebral fluid or swelling. No visible canal hematoma. Disc levels: Small disc protrusions at C4-C5 and C5-C6. Mild bilateral uncovertebral hypertrophy at C5-C6. Upper chest: Negative. Other: None. IMPRESSION: 1. No acute intracranial abnormality. 2. No acute cervical spine fracture or traumatic listhesis. Electronically Signed   By: Titus Dubin M.D.   On: 01/09/2021 09:24    Assessment & Plan:   Teara was seen today for hypertension and diabetes.  Diagnoses and all orders for this visit:  Vitamin D deficiency- Her Vit D is normal now. -     VITAMIN D 25 Hydroxy (Vit-D Deficiency, Fractures); Future -     VITAMIN D 25 Hydroxy (Vit-D Deficiency, Fractures)  Essential hypertension- Her blood pressure is well controlled -     Basic metabolic panel; Future -     Basic metabolic panel  Type II diabetes mellitus with manifestations ([place])- Her blood sugar is well controlled. -     Basic metabolic panel; Future -     Hemoglobin A1c; Future -     Hemoglobin A1c -     Basic metabolic panel  Need for shingles vaccine -     Zoster Vaccine Adjuvanted [facility]) injection; Inject 0.5 mLs into the muscle once for 1 dose.   I am having [employer] start on Shingrix. I am also having her maintain her cetirizine, acetaminophen, ProAir HFA, Breo Ellipta,  Vitamin D (Ergocalciferol), losartan, vitamin B-12, dicyclomine, donepezil, folic acid, fluticasone, atorvastatin, methenamine, and nitrofurantoin (macrocrystal-monohydrate).  Meds ordered this encounter  Medications   Zoster Vaccine Adjuvanted [facility]) injection    Sig: Inject 0.5 mLs into the muscle once for 1 dose.    Dispense:  0.5 mL  Refill:  1     Follow-up: Return in about 6 months (around 12/03/2021).  Scarlette Calico, MD

## 2021-06-06 ENCOUNTER — Other Ambulatory Visit: Payer: Self-pay | Admitting: Internal Medicine

## 2021-06-06 DIAGNOSIS — E559 Vitamin D deficiency, unspecified: Secondary | ICD-10-CM

## 2021-06-06 MED ORDER — VITAMIN D (ERGOCALCIFEROL) 1.25 MG (50000 UNIT) PO CAPS: 50000.0000 [IU] | ORAL_CAPSULE | ORAL | 0 refills | Status: DC

## 2021-06-06 MED ORDER — SHINGRIX 50 MCG/0.5ML IM SUSR: 0.5000 mL | Freq: Once | INTRAMUSCULAR | 1 refills | Status: AC

## 2021-06-14 DIAGNOSIS — Y92009 Unspecified place in unspecified non-institutional (private) residence as the place of occurrence of the external cause: Secondary | ICD-10-CM | POA: Diagnosis not present

## 2021-06-14 DIAGNOSIS — E440 Moderate protein-calorie malnutrition: Secondary | ICD-10-CM | POA: Diagnosis not present

## 2021-07-30 ENCOUNTER — Other Ambulatory Visit: Payer: Self-pay | Admitting: Internal Medicine

## 2021-07-30 DIAGNOSIS — E785 Hyperlipidemia, unspecified: Secondary | ICD-10-CM

## 2021-08-07 DIAGNOSIS — Z1231 Encounter for screening mammogram for malignant neoplasm of breast: Secondary | ICD-10-CM | POA: Diagnosis not present

## 2021-08-07 DIAGNOSIS — Z960 Presence of urogenital implants: Secondary | ICD-10-CM | POA: Diagnosis not present

## 2021-08-07 DIAGNOSIS — N390 Urinary tract infection, site not specified: Secondary | ICD-10-CM | POA: Diagnosis not present

## 2021-08-15 ENCOUNTER — Other Ambulatory Visit: Payer: Self-pay | Admitting: Obstetrics and Gynecology

## 2021-08-15 DIAGNOSIS — R928 Other abnormal and inconclusive findings on diagnostic imaging of breast: Secondary | ICD-10-CM

## 2021-08-21 ENCOUNTER — Other Ambulatory Visit: Payer: Self-pay | Admitting: Internal Medicine

## 2021-08-21 DIAGNOSIS — E559 Vitamin D deficiency, unspecified: Secondary | ICD-10-CM

## 2021-08-21 DIAGNOSIS — D520 Dietary folate deficiency anemia: Secondary | ICD-10-CM

## 2021-08-22 ENCOUNTER — Other Ambulatory Visit: Payer: Self-pay | Admitting: Internal Medicine

## 2021-08-22 ENCOUNTER — Telehealth: Payer: Self-pay | Admitting: Internal Medicine

## 2021-08-22 DIAGNOSIS — R922 Inconclusive mammogram: Secondary | ICD-10-CM | POA: Diagnosis not present

## 2021-08-22 DIAGNOSIS — I10 Essential (primary) hypertension: Secondary | ICD-10-CM

## 2021-08-22 DIAGNOSIS — R928 Other abnormal and inconclusive findings on diagnostic imaging of breast: Secondary | ICD-10-CM | POA: Diagnosis not present

## 2021-08-22 MED ORDER — LOSARTAN POTASSIUM 50 MG PO TABS: 50.0000 mg | ORAL_TABLET | Freq: Every day | ORAL | 1 refills | Status: DC

## 2021-08-22 NOTE — Telephone Encounter (Signed)
1.Medication Requested: losartan (COZAAR) 50 MG tablet ? ?2. Pharmacy (Name, [address], [facility]): [place], [place] ? ?3. On Med List: Y ? ?4. Last Visit with PCP: 06-05-2021 ? ?5. Next visit date with PCP: 12-04-2021 ? ? ?Agent: Please be advised that RX refills may take up to 3 business days. We ask that you follow-up with your pharmacy.  ?

## 2021-09-11 ENCOUNTER — Other Ambulatory Visit: Payer: Medicare Other

## 2021-10-03 ENCOUNTER — Other Ambulatory Visit: Payer: Self-pay

## 2021-10-03 ENCOUNTER — Telehealth: Payer: Self-pay | Admitting: Internal Medicine

## 2021-10-03 DIAGNOSIS — K589 Irritable bowel syndrome without diarrhea: Secondary | ICD-10-CM

## 2021-10-03 MED ORDER — DICYCLOMINE HCL 10 MG PO CAPS: 10.0000 mg | ORAL_CAPSULE | Freq: Three times a day (TID) | ORAL | 1 refills | Status: DC

## 2021-10-03 NOTE — Telephone Encounter (Signed)
Pt daughter is requesting a refill: ?dicyclomine (BENTYL) 10 MG capsule ? ?Pharmacy: ?[facility] [place], [place] ? ?LOV 06/05/21 ?ROV 12/03/21 ?

## 2021-10-03 NOTE — Telephone Encounter (Signed)
1.Medication Requested: ?dicyclomine (BENTYL) 10 MG capsule ?2. Pharmacy (Name, [address], [place]): ?[id number] - [place], [place] Phone:  [phone]  ?Fax:  [phone]  ?  ? ?3. On Med List: yes ? ?4. Last Visit with PCP: ? ?5. Next visit date with PCP: ? ? ?Agent: Please be advised that RX refills may take up to 3 business days. We ask that you follow-up with your pharmacy.  ?

## 2021-10-03 NOTE — Telephone Encounter (Signed)
Message sent in provider absence. Requesting medication refills ?

## 2021-10-06 MED ORDER — DICYCLOMINE HCL 10 MG PO CAPS: 10.0000 mg | ORAL_CAPSULE | Freq: Three times a day (TID) | ORAL | 1 refills | Status: DC

## 2021-10-26 ENCOUNTER — Other Ambulatory Visit: Payer: Self-pay | Admitting: Internal Medicine

## 2021-10-26 DIAGNOSIS — E785 Hyperlipidemia, unspecified: Secondary | ICD-10-CM

## 2021-11-05 ENCOUNTER — Other Ambulatory Visit: Payer: Self-pay | Admitting: Internal Medicine

## 2021-11-13 ENCOUNTER — Other Ambulatory Visit: Payer: Self-pay | Admitting: Internal Medicine

## 2021-11-13 DIAGNOSIS — E559 Vitamin D deficiency, unspecified: Secondary | ICD-10-CM

## 2021-11-18 DIAGNOSIS — Z960 Presence of urogenital implants: Secondary | ICD-10-CM | POA: Diagnosis not present

## 2021-11-20 DIAGNOSIS — Z87440 Personal history of urinary (tract) infections: Secondary | ICD-10-CM | POA: Diagnosis not present

## 2021-12-03 ENCOUNTER — Ambulatory Visit (INDEPENDENT_AMBULATORY_CARE_PROVIDER_SITE_OTHER): Payer: Medicare Other | Admitting: Internal Medicine

## 2021-12-03 ENCOUNTER — Encounter: Payer: Self-pay | Admitting: Internal Medicine

## 2021-12-03 VITALS — BP 126/72 | HR 95 | Temp 98.2°F | Ht 64.0 in | Wt 134.0 lb

## 2021-12-03 DIAGNOSIS — E118 Type 2 diabetes mellitus with unspecified complications: Secondary | ICD-10-CM

## 2021-12-03 DIAGNOSIS — I10 Essential (primary) hypertension: Secondary | ICD-10-CM

## 2021-12-03 NOTE — Progress Notes (Unsigned)
Subjective:  Patient ID: Nichole Silva, female    DOB: 02-Apr-1935  Age: 87 y.o. MRN: [id number]  CC: No chief complaint on file.   HPI Nichole Silva presents for ***  Outpatient Medications Prior to Visit  Medication Sig Dispense Refill   acetaminophen (TYLENOL) 325 MG tablet Take 325 mg by mouth every 6 (six) hours as needed for mild pain or moderate pain. Pain/headache     atorvastatin (LIPITOR) 40 MG tablet TAKE 1 TABLET(40 MG) BY MOUTH DAILY 90 tablet 0   BREO ELLIPTA 200-25 MCG/INH AEPB INHALE 1 PUFF BY MOUTH EVERY DAY 60 each 3   cetirizine (ZYRTEC) 10 MG tablet Take 10 mg by mouth daily.     dicyclomine (BENTYL) 10 MG capsule Take 1 capsule (10 mg total) by mouth 3 (three) times daily before meals. 270 capsule 1   donepezil (ARICEPT) 5 MG tablet Take 1 tablet (5 mg total) by mouth at bedtime. 90 tablet 3   fluticasone (FLONASE) 50 MCG/ACT nasal spray SHAKE LIQUID AND USE 2 SPRAYS IN EACH NOSTRIL AT BEDTIME 48 g 1   folic acid (FOLVITE) 1 MG tablet TAKE 1 TABLET(1 MG) BY MOUTH DAILY 90 tablet 1   losartan (COZAAR) 50 MG tablet Take 1 tablet (50 mg total) by mouth daily. 90 tablet 1   methenamine (HIPREX) 1 g tablet Take 1 tablet (1 g total) by mouth 2 (two) times daily with a meal. 180 tablet 1   nitrofurantoin, macrocrystal-monohydrate, (MACROBID) 100 MG capsule Take 100 mg by mouth 2 (two) times daily.     PROAIR HFA 108 (90 Base) MCG/ACT inhaler INHALE 1 TO 2 PUFFS BY MOUTH EVERY 4 HOURS AS NEEDED FOR WHEEZE OR SHORTNESS OF BREATH (Patient taking differently: Inhale 1-2 puffs into the lungs every 4 (four) hours as needed for wheezing or shortness of breath.) 8.5 Inhaler 11   vitamin B-12 (CYANOCOBALAMIN) 1000 MCG tablet Take 1,000 mcg by mouth daily.     Vitamin D, Ergocalciferol, (DRISDOL) 1.25 MG (50000 UNIT) CAPS capsule TAKE 1 CAPSULE BY MOUTH EVERY 7 DAYS 12 capsule 0   No facility-administered medications prior to visit.    ROS Review of Systems  Objective:  BP 126/72  (BP Location: Right Arm, Patient Position: Sitting, Cuff Size: Large)   Pulse 95   Temp 98.2 F (36.8 C) (Oral)   Ht '5\' 4"'$  (1.626 m)   Wt 134 lb (60.8 kg)   SpO2 97%   BMI 23.00 kg/m   BP Readings from Last 3 Encounters:  12/03/21 126/72  06/05/21 136/72  05/15/21 (!) 146/80    Wt Readings from Last 3 Encounters:  12/03/21 134 lb (60.8 kg)  06/05/21 137 lb (62.1 kg)  05/15/21 138 lb (62.6 kg)    Physical Exam  Lab Results  Component Value Date   WBC 10.6 (H) 02/27/2021   HGB 12.3 02/27/2021   HCT 37.1 02/27/2021   PLT 371.0 02/27/2021   GLUCOSE 115 (H) 06/05/2021   CHOL 130 02/27/2021   TRIG 113.0 02/27/2021   HDL 48.90 02/27/2021   LDLDIRECT 102.0 07/29/2015   LDLCALC 58 02/27/2021   ALT 8 01/12/2021   AST 11 (L) 01/12/2021   NA 138 06/05/2021   K 4.0 06/05/2021   CL 103 06/05/2021   CREATININE 0.82 06/05/2021   BUN 22 06/05/2021   CO2 27 06/05/2021   TSH 1.097 01/10/2021   HGBA1C 6.0 06/05/2021   MICROALBUR 13.1 12/07/2019    DG Ribs Unilateral W/Chest Left  Result Date:  01/09/2021 CLINICAL DATA:  Status post fall.  Pain EXAM: LEFT RIBS AND CHEST - 3+ VIEW COMPARISON:  04/06/2019 FINDINGS: No fracture or other bone lesions are seen involving the ribs. There is no evidence of pneumothorax or pleural effusion. Lung volumes are low. There is mild platelike atelectasis in the lung bases. Heart size and mediastinal contours are within normal limits. IMPRESSION: 1. No rib fractures identified. 2. Low lung volumes and bibasilar atelectasis. Electronically Signed   By: Kerby Moors M.D.   On: 01/09/2021 09:49   DG Pelvis 1-2 Views  Result Date: 01/09/2021 CLINICAL DATA:  Fall. EXAM: PELVIS - 1-2 VIEW COMPARISON:  CT abdomen pelvis dated May 27, 2016. FINDINGS: There is no evidence of pelvic fracture or diastasis. No pelvic bone lesions are seen. IMPRESSION: Negative. Electronically Signed   By: Titus Dubin M.D.   On: 01/09/2021 09:48   CT Head Wo  Contrast  Result Date: 01/09/2021 CLINICAL DATA:  Fall. EXAM: CT HEAD WITHOUT CONTRAST CT CERVICAL SPINE WITHOUT CONTRAST TECHNIQUE: Multidetector CT imaging of the head and cervical spine was performed following the standard protocol without intravenous contrast. Multiplanar CT image reconstructions of the cervical spine were also generated. COMPARISON:  CT head dated May 27, 2016. FINDINGS: CT HEAD FINDINGS Brain: No evidence of acute infarction, hemorrhage, hydrocephalus, extra-axial collection or mass lesion/mass effect. Stable mild atrophy and chronic microvascular ischemic changes. Vascular: Atherosclerotic vascular calcification of the carotid siphons. No hyperdense vessel. Skull: Normal. Negative for fracture or focal lesion. Sinuses/Orbits: No acute finding. Other: None. CT CERVICAL SPINE FINDINGS Alignment: Normal. Skull base and vertebrae: No acute fracture. No primary bone lesion or focal pathologic process. Soft tissues and spinal canal: No prevertebral fluid or swelling. No visible canal hematoma. Disc levels: Small disc protrusions at C4-C5 and C5-C6. Mild bilateral uncovertebral hypertrophy at C5-C6. Upper chest: Negative. Other: None. IMPRESSION: 1. No acute intracranial abnormality. 2. No acute cervical spine fracture or traumatic listhesis. Electronically Signed   By: Titus Dubin M.D.   On: 01/09/2021 09:24   CT Cervical Spine Wo Contrast  Result Date: 01/09/2021 CLINICAL DATA:  Fall. EXAM: CT HEAD WITHOUT CONTRAST CT CERVICAL SPINE WITHOUT CONTRAST TECHNIQUE: Multidetector CT imaging of the head and cervical spine was performed following the standard protocol without intravenous contrast. Multiplanar CT image reconstructions of the cervical spine were also generated. COMPARISON:  CT head dated May 27, 2016. FINDINGS: CT HEAD FINDINGS Brain: No evidence of acute infarction, hemorrhage, hydrocephalus, extra-axial collection or mass lesion/mass effect. Stable mild atrophy and chronic  microvascular ischemic changes. Vascular: Atherosclerotic vascular calcification of the carotid siphons. No hyperdense vessel. Skull: Normal. Negative for fracture or focal lesion. Sinuses/Orbits: No acute finding. Other: None. CT CERVICAL SPINE FINDINGS Alignment: Normal. Skull base and vertebrae: No acute fracture. No primary bone lesion or focal pathologic process. Soft tissues and spinal canal: No prevertebral fluid or swelling. No visible canal hematoma. Disc levels: Small disc protrusions at C4-C5 and C5-C6. Mild bilateral uncovertebral hypertrophy at C5-C6. Upper chest: Negative. Other: None. IMPRESSION: 1. No acute intracranial abnormality. 2. No acute cervical spine fracture or traumatic listhesis. Electronically Signed   By: Titus Dubin M.D.   On: 01/09/2021 09:24    Assessment & Plan:   There are no diagnoses linked to this encounter. I am having Nichole Silva maintain her cetirizine, acetaminophen, ProAir HFA, Breo Ellipta, vitamin B-12, donepezil, methenamine, nitrofurantoin (macrocrystal-monohydrate), folic acid, losartan, dicyclomine, atorvastatin, fluticasone, and Vitamin D (Ergocalciferol).  No orders of the defined types were  placed in this encounter.    Follow-up: No follow-ups on file.  Scarlette Calico, MD

## 2022-01-14 ENCOUNTER — Ambulatory Visit: Payer: Self-pay

## 2022-01-14 NOTE — Patient Outreach (Signed)
  Care Coordination   01/14/2022 Name: Nichole Silva MRN: [id number] DOB: 1934-07-17   Care Coordination Outreach Attempts:  An unsuccessful telephone outreach was attempted today to offer the patient information about available care coordination services as a benefit of their health plan.   Follow Up Plan:  Additional outreach attempts will be made to offer the patient care coordination information and services.   Encounter Outcome:  No Answer  Care Coordination Interventions Activated:  No   Care Coordination Interventions:  No, not indicated    Thea Silversmith, RN, MSN, BSN, CCM Care Coordinator [phone]

## 2022-01-15 ENCOUNTER — Ambulatory Visit: Payer: Self-pay

## 2022-01-15 DIAGNOSIS — I10 Essential (primary) hypertension: Secondary | ICD-10-CM

## 2022-01-15 NOTE — Patient Instructions (Signed)
Visit Information  Thank you for taking time to visit with me today. Please don't hesitate to contact me if I can be of assistance to you.   Following are the goals we discussed today:   Goals Addressed             This Visit's Progress    Maintain and/or improve health       Care Coordination Interventions: Advised patient to contact provider with questions or concerns as needed Care Guide referral for community resources-housing modifications; possible eligibility for community housing solutions and dementia resources for care giver support such as [facility] solutions Discussed plans with patient for ongoing care management follow up and provided patient with direct contact information for care management team Discussed Annual wellness visit and benefit Care coordination with primary care provider with request for  Neurology referral per family request Discussed [facility] - support re: dementia resource          Our next appointment is by telephone on 02/12/22 at 10 am  Please call the care guide team at [phone] if you need to cancel or reschedule your appointment.   If you are experiencing a Mental Health or [place] or need someone to talk to, please call the Suicide and Crisis Lifeline: 988  Patient verbalizes understanding of instructions and care plan provided today and agrees to view in [place]. Active MyChart status and patient understanding of how to access instructions and care plan via MyChart confirmed with patient.     Thea Silversmith, RN, MSN, BSN, CCM Care Coordinator [phone]

## 2022-01-15 NOTE — Patient Outreach (Signed)
  Care Coordination   Initial Visit Note   01/15/2022 Name: Nichole Silva MRN: [id number] DOB: July 02, 1934  Nichole Silva is a 87 y.o. year old female who sees Janith Lima, MD for primary care. I spoke with  Daughter Curt Bears Morris/DPR by phone today.  What matters to the patients health and wellness today?  Daughter, Curt Bears reports patient has a history of dementia, but has not seen a Neurologist.  Daughter is interested in referral for neurologist re: dementia evaluation. She also reports they are trying to complete modifications to her parents home. She reports Patient's husband also has dementia that is more advanced than patients. She reports would like to keep both parents in the home.   Goals Addressed             This Visit's Progress    Maintain and/or improve health       Care Coordination Interventions: Advised patient to contact provider with questions or concerns as needed Care Guide referral for community resources-housing modifications; possible eligibility for community housing solutions and dementia resources for care giver support such as [facility] Discussed plans with patient for ongoing care management follow up and provided patient with direct contact information for care management team Discussed Annual wellness visit and benefit Care coordination with primary care provider with request for  Neurology referral per family request Discussed [facility] - support re: dementia resource          SDOH assessments and interventions completed:  Yes  SDOH Interventions Today    Flowsheet Row Most Recent Value  SDOH Interventions   Food Insecurity Interventions Other (Comment)  [declines referral to care guide stating she is aware of local resources and has information on meals on wheels. Daughters assist with food security]  Transportation Interventions Intervention Not Indicated        Care Coordination Interventions Activated:  Yes   Care Coordination Interventions:  Yes, provided   Follow up plan: Follow up call scheduled for 02/12/22    Encounter Outcome:  Pt. Visit Completed   Thea Silversmith, RN, MSN, BSN, [place] Coordinator [phone]

## 2022-01-19 ENCOUNTER — Telehealth: Payer: Self-pay

## 2022-01-19 NOTE — Telephone Encounter (Signed)
   Telephone encounter was:  Unsuccessful.  01/19/2022 Name: Nichole Silva MRN: [id number] DOB: [birth year]  Unsuccessful outbound call made today to assist with:  Home Modifications and demential resources.  Outreach Attempt:  1st Attempt  A HIPAA compliant voice message was left requesting a return call.  Instructed patient to call back at [phone].  Zakry Caso, AAS Paralegal, [place] [facility]  [address], [place] [address] ??[email or web address]  ?? [phone]   [email or web address]

## 2022-01-21 ENCOUNTER — Telehealth: Payer: Self-pay

## 2022-01-21 NOTE — Telephone Encounter (Signed)
   Telephone encounter was:  Successful.  01/21/2022 Name: Nichole Silva MRN: [id number] DOB: 31-Jan-1935  Nichole Silva is a 87 y.o. year old female who is a primary care patient of Janith Lima, MD . The community resource team was consulted for assistance with  dementia, home modifications.  Care guide performed the following interventions: Spoke with patient's [name], she was unable to talk and asked if she could call me back later.  Nichole Silva has my name and number to contact.  Follow Up Plan:   Patient's daughter will call me back.  Yoandri Congrove, AAS Paralegal, [place] [facility]  [address], [place] [address] ??[email or web address]  ?? [phone]   [email or web address]

## 2022-01-22 ENCOUNTER — Telehealth: Payer: Self-pay

## 2022-01-22 NOTE — Telephone Encounter (Signed)
   Telephone encounter was:  Successful.  01/22/2022 Name: Nichole Silva MRN: [id number] DOB: 23-Feb-1935  Nichole Silva is a 87 y.o. year old female who is a primary care patient of Janith Lima, MD . The community resource team was consulted for assistance with  dementia support.  Care guide performed the following interventions: Spoke with patient's daughter Elvera Lennox about [facility] Aging Gracefully Program and Alzheimers [employer] and Support Program. Verified email address [email or web address] sent information. Curt Bears has also used resources as Tax adviser. Curt Bears verifed she received the emailed resources.    Follow Up Plan:  No further follow up planned at this time. The patient has been provided with needed resources.  Tailor [place], AAS Paralegal, [place] [facility]  [address], [place] [address] ??[email or web address]  ?? [phone]   [email or web address]

## 2022-01-25 ENCOUNTER — Other Ambulatory Visit: Payer: Self-pay | Admitting: Internal Medicine

## 2022-01-25 DIAGNOSIS — E785 Hyperlipidemia, unspecified: Secondary | ICD-10-CM

## 2022-02-05 ENCOUNTER — Telehealth: Payer: Self-pay

## 2022-02-05 ENCOUNTER — Other Ambulatory Visit: Payer: Self-pay | Admitting: Internal Medicine

## 2022-02-05 ENCOUNTER — Telehealth: Payer: Self-pay | Admitting: Internal Medicine

## 2022-02-05 DIAGNOSIS — F0390 Unspecified dementia without behavioral disturbance: Secondary | ICD-10-CM

## 2022-02-05 MED ORDER — DONEPEZIL HCL 5 MG PO TABS: 5.0000 mg | ORAL_TABLET | Freq: Every day | ORAL | 1 refills | Status: DC

## 2022-02-05 NOTE — Telephone Encounter (Signed)
Patient needs her aricept refilled - Please send to [facility] at [place], [place]

## 2022-02-05 NOTE — Patient Outreach (Signed)
  Care Coordination   Follow Up Visit Note   02/05/2022 Name: Nichole Silva MRN: [id number] DOB: 06-29-1934  Nichole Silva is a 87 y.o. year old female who sees Janith Lima, MD for primary care. I spoke with daughter, Nichole Silva by phone today.  What matters to the patients health and wellness today?  Nichole Silva called regarding prescription refill needs.    Goals Addressed             This Visit's Progress    Maintain and/or improve health       Care Coordination Interventions: Advised patient to continue usual refill process with requesting refill to pharmacy and follow up with provider as needed. Discussed plans with patient for ongoing care management follow up and provided patient with direct contact information for care management team             SDOH assessments and interventions completed:  No     Care Coordination Interventions Activated:  Yes  Care Coordination Interventions:  Yes, provided   Follow up plan: Follow up call scheduled for 02/12/22    Encounter Outcome:  Pt. Visit Completed   Thea Silversmith, RN, MSN, BSN, [place] Coordinator [phone]

## 2022-02-05 NOTE — Patient Instructions (Signed)
Visit Information  Thank you for taking time to visit with me today. Please don't hesitate to contact me if I can be of assistance to you.   Following are the goals we discussed today:   Goals Addressed             This Visit's Progress    Maintain and/or improve health       Care Coordination Interventions: Advised patient to continue usual refill process with requesting refill to pharmacy and follow up with provider as needed. Discussed plans with patient for ongoing care management follow up and provided patient with direct contact information for care management team             Our next appointment is by telephone on 02/12/22 at 10 am  Please call the care guide team at [phone] if you need to cancel or reschedule your appointment.   If you are experiencing a Mental Health or [place] or need someone to talk to, please call the Suicide and Crisis Lifeline: 988  Patient verbalizes understanding of instructions and care plan provided today and agrees to view in [place]. Active MyChart status and patient understanding of how to access instructions and care plan via MyChart confirmed with patient.      Thea Silversmith, RN, MSN, BSN, [place] Coordinator [phone]

## 2022-02-12 ENCOUNTER — Ambulatory Visit: Payer: Self-pay

## 2022-02-12 ENCOUNTER — Other Ambulatory Visit: Payer: Self-pay | Admitting: Internal Medicine

## 2022-02-12 DIAGNOSIS — F0390 Unspecified dementia without behavioral disturbance: Secondary | ICD-10-CM

## 2022-02-12 NOTE — Patient Outreach (Signed)
  Care Coordination   Follow Up Visit Note   02/12/2022 Name: Nichole Silva MRN: [id number] DOB: 11-24-1934  Nichole Silva is a 87 y.o. year old female who sees Nichole Lima, MD for primary care. I spoke with  patient's [name] by phone today.  What matters to the patients health and wellness today?  Ms. Nichole Silva reports patient is doing well. She reports sister with recent hospitalization, but states supportive family and they are managing care. She reports receiving donepezil refill. She reports they continue to provide activities for patient. She denies the need for LCSW at this time. She reports mobile meals to start soon. And reports patient continues to have planned activities with family and Nichole Silva states she plan to arrange "[place] transportation services for patient.   Goals Addressed             This Visit's Progress    Maintain and/or improve health       Care Coordination Interventions: Discussed pharmacy medication refill process and barriers, encouraged to contact provider office as needed. Upcoming scheduled appointments reviewed Coordination/Collaboration with primary care provider re: neurology referral request Continue to maintain activities that patient enjoys Discussed [facility] contact number provided [phone] Caregiver burnout discussed and discussed LCSW available if needed             SDOH assessments and interventions completed:  No  Care Coordination Interventions Activated:  Yes  Care Coordination Interventions:  Yes, provided   Follow up plan: Follow up call scheduled for 03/19/22    Encounter Outcome:  Pt. Visit Completed   Nichole Silversmith, RN, MSN, BSN, [place] Coordinator [phone]

## 2022-02-13 ENCOUNTER — Other Ambulatory Visit: Payer: Self-pay | Admitting: Internal Medicine

## 2022-02-13 ENCOUNTER — Telehealth: Payer: Self-pay | Admitting: Internal Medicine

## 2022-02-13 DIAGNOSIS — E559 Vitamin D deficiency, unspecified: Secondary | ICD-10-CM

## 2022-02-13 MED ORDER — VITAMIN D (ERGOCALCIFEROL) 1.25 MG (50000 UNIT) PO CAPS: 50000.0000 [IU] | ORAL_CAPSULE | ORAL | 0 refills | Status: DC

## 2022-02-13 NOTE — Telephone Encounter (Signed)
Patient's daughter called for refill of Vitamin D, Ergocalciferol, (DRISDOL) 1.25 MG (50000 UNIT) CAPS capsule   [facility] [place], [place] - [address]

## 2022-02-23 ENCOUNTER — Telehealth: Payer: Self-pay | Admitting: Internal Medicine

## 2022-02-23 DIAGNOSIS — D520 Dietary folate deficiency anemia: Secondary | ICD-10-CM

## 2022-02-23 NOTE — Telephone Encounter (Signed)
Note not needed 

## 2022-02-24 ENCOUNTER — Other Ambulatory Visit: Payer: Self-pay | Admitting: Internal Medicine

## 2022-02-24 DIAGNOSIS — I10 Essential (primary) hypertension: Secondary | ICD-10-CM

## 2022-03-19 ENCOUNTER — Telehealth: Payer: Self-pay

## 2022-03-19 DIAGNOSIS — Z960 Presence of urogenital implants: Secondary | ICD-10-CM | POA: Diagnosis not present

## 2022-03-19 NOTE — Patient Outreach (Signed)
  Care Coordination   03/19/2022 Name: Nichole Silva MRN: [id number] DOB: February 22, 1935   Care Coordination Outreach Attempts:  An unsuccessful telephone outreach was attempted today to offer the patient information about available care coordination services as a benefit of their health plan. RNCM spoke with [name] this morning, who requested a call back later today. RNCM called called again with no answer.  Follow Up Plan:  Additional outreach attempts will be made to offer the patient care coordination information and services.   Encounter Outcome:  No Answer  Care Coordination Interventions Activated:  No   Care Coordination Interventions:  No, not indicated    Thea Silversmith, RN, MSN, BSN, [place] Coordinator [phone]

## 2022-03-26 ENCOUNTER — Ambulatory Visit: Payer: Medicare Other | Admitting: Psychiatry

## 2022-04-02 ENCOUNTER — Telehealth: Payer: Self-pay | Admitting: Internal Medicine

## 2022-04-02 ENCOUNTER — Other Ambulatory Visit: Payer: Self-pay | Admitting: Internal Medicine

## 2022-04-02 DIAGNOSIS — K589 Irritable bowel syndrome without diarrhea: Secondary | ICD-10-CM

## 2022-04-02 MED ORDER — DICYCLOMINE HCL 10 MG PO CAPS: 10.0000 mg | ORAL_CAPSULE | Freq: Three times a day (TID) | ORAL | 1 refills | Status: DC

## 2022-04-02 NOTE — Telephone Encounter (Signed)
Daughter Curt Bears called requesting refill:  DICYCLOMINE 10 MG capsule. Take 3 per day   Send to:   [facility] [place], [place] [address] Phone: [phone]  Fax: [phone]

## 2022-04-30 ENCOUNTER — Telehealth: Payer: Self-pay | Admitting: Internal Medicine

## 2022-04-30 ENCOUNTER — Other Ambulatory Visit: Payer: Self-pay | Admitting: Internal Medicine

## 2022-04-30 DIAGNOSIS — E785 Hyperlipidemia, unspecified: Secondary | ICD-10-CM

## 2022-04-30 MED ORDER — ATORVASTATIN CALCIUM 40 MG PO TABS: 40.0000 mg | ORAL_TABLET | Freq: Every day | ORAL | 0 refills | Status: DC

## 2022-04-30 NOTE — Telephone Encounter (Signed)
Caller & Relationship to patient: Inez Catalina patients daughter    Call back number: [phone]   Date of last office visit:12/03/21   Date of next office visit:06/11/22   Medication(s) to be refilled:atorvastatin (LIPITOR) 40 MG tablet     Preferred Pharmacy: [facility]

## 2022-05-08 ENCOUNTER — Other Ambulatory Visit: Payer: Self-pay | Admitting: Internal Medicine

## 2022-05-08 DIAGNOSIS — E559 Vitamin D deficiency, unspecified: Secondary | ICD-10-CM

## 2022-05-15 ENCOUNTER — Other Ambulatory Visit: Payer: Self-pay | Admitting: Internal Medicine

## 2022-05-15 DIAGNOSIS — F0390 Unspecified dementia without behavioral disturbance: Secondary | ICD-10-CM

## 2022-06-11 ENCOUNTER — Ambulatory Visit: Payer: Medicare Other | Admitting: Internal Medicine

## 2022-07-02 ENCOUNTER — Ambulatory Visit: Payer: Medicare Other | Admitting: Internal Medicine

## 2022-07-30 ENCOUNTER — Other Ambulatory Visit: Payer: Self-pay | Admitting: Internal Medicine

## 2022-07-30 DIAGNOSIS — E559 Vitamin D deficiency, unspecified: Secondary | ICD-10-CM

## 2022-07-31 ENCOUNTER — Telehealth: Payer: Self-pay | Admitting: Internal Medicine

## 2022-07-31 ENCOUNTER — Other Ambulatory Visit: Payer: Self-pay | Admitting: Internal Medicine

## 2022-07-31 NOTE — Telephone Encounter (Signed)
Patient's husband passed away Aug 26, 2022. Patient has been prescribed Xanax before and would like to know if it can be prescribed again temporarily to help. Daughter would like a call back to help discuss with her mother. Best callback for her is [phone].  Patient's next appointment is 08/06/22.

## 2022-08-03 ENCOUNTER — Other Ambulatory Visit (HOSPITAL_COMMUNITY): Payer: Self-pay

## 2022-08-04 ENCOUNTER — Other Ambulatory Visit: Payer: Self-pay | Admitting: Internal Medicine

## 2022-08-04 DIAGNOSIS — F411 Generalized anxiety disorder: Secondary | ICD-10-CM

## 2022-08-04 DIAGNOSIS — E559 Vitamin D deficiency, unspecified: Secondary | ICD-10-CM

## 2022-08-04 DIAGNOSIS — E785 Hyperlipidemia, unspecified: Secondary | ICD-10-CM

## 2022-08-04 MED ORDER — ALPRAZOLAM 0.25 MG PO TABS: 0.2500 mg | ORAL_TABLET | Freq: Three times a day (TID) | ORAL | 0 refills | Status: DC | PRN

## 2022-08-04 NOTE — Telephone Encounter (Signed)
Patient's daughter called about the xanax.

## 2022-08-06 ENCOUNTER — Ambulatory Visit: Payer: Medicare Other | Admitting: Internal Medicine

## 2022-08-11 ENCOUNTER — Telehealth: Payer: Self-pay | Admitting: Internal Medicine

## 2022-08-11 NOTE — Telephone Encounter (Signed)
Pt daughter called concerning about her mother with dementia pt daughter want Dr Ronnald Ramp to be aware when they come in for their upcoming appt.

## 2022-08-13 DIAGNOSIS — Z960 Presence of urogenital implants: Secondary | ICD-10-CM | POA: Diagnosis not present

## 2022-08-17 ENCOUNTER — Other Ambulatory Visit: Payer: Self-pay | Admitting: Internal Medicine

## 2022-08-17 DIAGNOSIS — E785 Hyperlipidemia, unspecified: Secondary | ICD-10-CM

## 2022-08-24 ENCOUNTER — Other Ambulatory Visit: Payer: Self-pay | Admitting: Internal Medicine

## 2022-08-24 DIAGNOSIS — I10 Essential (primary) hypertension: Secondary | ICD-10-CM

## 2022-08-24 DIAGNOSIS — D520 Dietary folate deficiency anemia: Secondary | ICD-10-CM

## 2022-08-27 ENCOUNTER — Encounter: Payer: Self-pay | Admitting: Internal Medicine

## 2022-08-27 ENCOUNTER — Ambulatory Visit (INDEPENDENT_AMBULATORY_CARE_PROVIDER_SITE_OTHER): Payer: Medicare Other | Admitting: Internal Medicine

## 2022-08-27 ENCOUNTER — Other Ambulatory Visit: Payer: Self-pay | Admitting: Internal Medicine

## 2022-08-27 VITALS — BP 136/84 | HR 81 | Temp 97.9°F | Ht 64.0 in | Wt 121.0 lb

## 2022-08-27 DIAGNOSIS — I10 Essential (primary) hypertension: Secondary | ICD-10-CM

## 2022-08-27 DIAGNOSIS — D520 Dietary folate deficiency anemia: Secondary | ICD-10-CM | POA: Diagnosis not present

## 2022-08-27 DIAGNOSIS — E559 Vitamin D deficiency, unspecified: Secondary | ICD-10-CM

## 2022-08-27 DIAGNOSIS — Z0001 Encounter for general adult medical examination with abnormal findings: Secondary | ICD-10-CM | POA: Diagnosis not present

## 2022-08-27 DIAGNOSIS — E118 Type 2 diabetes mellitus with unspecified complications: Secondary | ICD-10-CM

## 2022-08-27 DIAGNOSIS — R300 Dysuria: Secondary | ICD-10-CM | POA: Diagnosis not present

## 2022-08-27 DIAGNOSIS — E785 Hyperlipidemia, unspecified: Secondary | ICD-10-CM

## 2022-08-27 LAB — URINALYSIS, ROUTINE W REFLEX MICROSCOPIC
Bilirubin Urine: NEGATIVE
Ketones, ur: NEGATIVE
Nitrite: NEGATIVE
Specific Gravity, Urine: 1.015 (ref 1.000–1.030)
Urine Glucose: NEGATIVE
Urobilinogen, UA: 0.2 (ref 0.0–1.0)
pH: 6 (ref 5.0–8.0)

## 2022-08-27 LAB — MICROALBUMIN / CREATININE URINE RATIO
Creatinine,U: 97.2 mg/dL
Microalb Creat Ratio: 4.3 mg/g (ref 0.0–30.0)
Microalb, Ur: 4.2 mg/dL — ABNORMAL HIGH (ref 0.0–1.9)

## 2022-08-27 NOTE — Progress Notes (Signed)
Subjective:  Patient ID: Nichole Silva, female    DOB: 09-26-1934  Age: 88 y.o. MRN: [id number]  CC: Annual Exam and Hypertension   HPI [name] presents for a CPX and f/up ---   She complains of dysuria but otherwise feels well.  Outpatient Medications Prior to Visit  Medication Sig Dispense Refill   acetaminophen (TYLENOL) 325 MG tablet Take 325 mg by mouth every 6 (six) hours as needed for mild pain or moderate pain. Pain/headache     ALPRAZolam (XANAX) 0.25 MG tablet Take 1 tablet (0.25 mg total) by mouth 3 (three) times daily as needed for anxiety. 270 tablet 0   atorvastatin (LIPITOR) 40 MG tablet TAKE 1 TABLET(40 MG) BY MOUTH DAILY 90 tablet 0   BREO ELLIPTA 200-25 MCG/INH AEPB INHALE 1 PUFF BY MOUTH EVERY DAY 60 each 3   cetirizine (ZYRTEC) 10 MG tablet Take 10 mg by mouth daily.     dicyclomine (BENTYL) 10 MG capsule Take 1 capsule (10 mg total) by mouth 3 (three) times daily before meals. 270 capsule 1   donepezil (ARICEPT) 5 MG tablet TAKE 1 TABLET(5 MG) BY MOUTH AT BEDTIME 90 tablet 1   fluticasone (FLONASE) 50 MCG/ACT nasal spray SHAKE LIQUID AND USE 2 SPRAYS IN EACH NOSTRIL AT BEDTIME 48 g 1   folic acid (FOLVITE) 1 MG tablet TAKE 1 TABLET(1 MG) BY MOUTH DAILY 90 tablet 1   losartan (COZAAR) 50 MG tablet TAKE 1 TABLET(50 MG) BY MOUTH DAILY 90 tablet 1   methenamine (HIPREX) 1 g tablet Take 1 tablet (1 g total) by mouth 2 (two) times daily with a meal. 180 tablet 1   nitrofurantoin, macrocrystal-monohydrate, (MACROBID) 100 MG capsule Take 100 mg by mouth 2 (two) times daily.     PROAIR HFA 108 (90 Base) MCG/ACT inhaler INHALE 1 TO 2 PUFFS BY MOUTH EVERY 4 HOURS AS NEEDED FOR WHEEZE OR SHORTNESS OF BREATH (Patient taking differently: Inhale 1-2 puffs into the lungs every 4 (four) hours as needed for wheezing or shortness of breath.) 8.5 Inhaler 11   vitamin B-12 (CYANOCOBALAMIN) 1000 MCG tablet Take 1,000 mcg by mouth daily.     Vitamin D, Ergocalciferol, (DRISDOL) 1.25 MG  (50000 UNIT) CAPS capsule TAKE 1 CAPSULE BY MOUTH EVERY 7 DAYS 4 capsule 0   No facility-administered medications prior to visit.    ROS Review of Systems  Constitutional: Negative.  Negative for diaphoresis, fatigue and unexpected weight change.  HENT: Negative.    Eyes: Negative.   Respiratory:  Negative for cough, chest tightness, shortness of breath and wheezing.   Cardiovascular:  Negative for chest pain, palpitations and leg swelling.  Gastrointestinal:  Negative for abdominal pain, constipation, diarrhea, nausea and vomiting.  Endocrine: Negative.   Genitourinary:  Positive for dysuria. Negative for difficulty urinating, hematuria and urgency.  Musculoskeletal: Negative.  Negative for arthralgias and back pain.  Skin: Negative.   Neurological:  Negative for dizziness, weakness, light-headedness and headaches.  Hematological:  Negative for adenopathy. Does not bruise/bleed easily.  Psychiatric/Behavioral: Negative.      Objective:  BP 136/84 (BP Location: Left Arm, Patient Position: Sitting, Cuff Size: Large)   Pulse 81   Temp 97.9 F (36.6 C) (Oral)   Ht 5\' 4"  (1.626 m)   Wt 121 lb (54.9 kg)   SpO2 94%   BMI 20.77 kg/m   BP Readings from Last 3 Encounters:  08/27/22 136/84  12/03/21 126/72  06/05/21 136/72    Wt Readings from Last 3  Encounters:  08/27/22 121 lb (54.9 kg)  12/03/21 134 lb (60.8 kg)  06/05/21 137 lb (62.1 kg)    Physical Exam Vitals reviewed.  Constitutional:      Appearance: Normal appearance.  HENT:     Nose: Nose normal.     Mouth/Throat:     Mouth: Mucous membranes are moist.  Eyes:     General: No scleral icterus.    Conjunctiva/sclera: Conjunctivae normal.  Cardiovascular:     Rate and Rhythm: Normal rate and regular rhythm.     Heart sounds: No murmur heard. Pulmonary:     Effort: Pulmonary effort is normal.     Breath sounds: No stridor. No wheezing, rhonchi or rales.  Abdominal:     General: Abdomen is flat.      Palpations: There is no mass.     Tenderness: There is no abdominal tenderness. There is no guarding.     Hernia: No hernia is present.  Musculoskeletal:        General: Normal range of motion.     Cervical back: Neck supple.     Right lower leg: No edema.     Left lower leg: No edema.  Lymphadenopathy:     Cervical: No cervical adenopathy.  Skin:    General: Skin is warm and dry.  Neurological:     General: No focal deficit present.     Mental Status: She is alert.  Psychiatric:        Mood and Affect: Mood normal.        Behavior: Behavior normal.     Lab Results  Component Value Date   WBC 8.3 09/03/2022   HGB 12.9 09/03/2022   HCT 38.4 09/03/2022   PLT 241.0 09/03/2022   GLUCOSE 98 09/03/2022   CHOL 158 09/03/2022   TRIG 102.0 09/03/2022   HDL 61.60 09/03/2022   LDLDIRECT 102.0 07/29/2015   LDLCALC 76 09/03/2022   ALT 10 09/03/2022   AST 18 09/03/2022   NA 140 09/03/2022   K 4.5 09/03/2022   CL 102 09/03/2022   CREATININE 0.77 09/03/2022   BUN 13 09/03/2022   CO2 30 09/03/2022   TSH 1.77 09/03/2022   HGBA1C 5.3 09/03/2022   MICROALBUR 4.2 (H) 08/27/2022    DG Ribs Unilateral W/Chest Left  Result Date: 01/09/2021 CLINICAL DATA:  Status post fall.  Pain EXAM: LEFT RIBS AND CHEST - 3+ VIEW COMPARISON:  04/06/2019 FINDINGS: No fracture or other bone lesions are seen involving the ribs. There is no evidence of pneumothorax or pleural effusion. Lung volumes are low. There is mild platelike atelectasis in the lung bases. Heart size and mediastinal contours are within normal limits. IMPRESSION: 1. No rib fractures identified. 2. Low lung volumes and bibasilar atelectasis. Electronically Signed   By: Signa Kellaylor  Stroud M.D.   On: 01/09/2021 09:49   DG Pelvis 1-2 Views  Result Date: 01/09/2021 CLINICAL DATA:  Fall. EXAM: PELVIS - 1-2 VIEW COMPARISON:  CT abdomen pelvis dated May 27, 2016. FINDINGS: There is no evidence of pelvic fracture or diastasis. No pelvic bone  lesions are seen. IMPRESSION: Negative. Electronically Signed   By: Obie DredgeWilliam T Derry M.D.   On: 01/09/2021 09:48   CT Head Wo Contrast  Result Date: 01/09/2021 CLINICAL DATA:  Fall. EXAM: CT HEAD WITHOUT CONTRAST CT CERVICAL SPINE WITHOUT CONTRAST TECHNIQUE: Multidetector CT imaging of the head and cervical spine was performed following the standard protocol without intravenous contrast. Multiplanar CT image reconstructions of the cervical spine were also  generated. COMPARISON:  CT head dated May 27, 2016. FINDINGS: CT HEAD FINDINGS Brain: No evidence of acute infarction, hemorrhage, hydrocephalus, extra-axial collection or mass lesion/mass effect. Stable mild atrophy and chronic microvascular ischemic changes. Vascular: Atherosclerotic vascular calcification of the carotid siphons. No hyperdense vessel. Skull: Normal. Negative for fracture or focal lesion. Sinuses/Orbits: No acute finding. Other: None. CT CERVICAL SPINE FINDINGS Alignment: Normal. Skull base and vertebrae: No acute fracture. No primary bone lesion or focal pathologic process. Soft tissues and spinal canal: No prevertebral fluid or swelling. No visible canal hematoma. Disc levels: Small disc protrusions at C4-C5 and C5-C6. Mild bilateral uncovertebral hypertrophy at C5-C6. Upper chest: Negative. Other: None. IMPRESSION: 1. No acute intracranial abnormality. 2. No acute cervical spine fracture or traumatic listhesis. Electronically Signed   By: Obie DredgeWilliam T Derry M.D.   On: 01/09/2021 09:24   CT Cervical Spine Wo Contrast  Result Date: 01/09/2021 CLINICAL DATA:  Fall. EXAM: CT HEAD WITHOUT CONTRAST CT CERVICAL SPINE WITHOUT CONTRAST TECHNIQUE: Multidetector CT imaging of the head and cervical spine was performed following the standard protocol without intravenous contrast. Multiplanar CT image reconstructions of the cervical spine were also generated. COMPARISON:  CT head dated May 27, 2016. FINDINGS: CT HEAD FINDINGS Brain: No evidence of  acute infarction, hemorrhage, hydrocephalus, extra-axial collection or mass lesion/mass effect. Stable mild atrophy and chronic microvascular ischemic changes. Vascular: Atherosclerotic vascular calcification of the carotid siphons. No hyperdense vessel. Skull: Normal. Negative for fracture or focal lesion. Sinuses/Orbits: No acute finding. Other: None. CT CERVICAL SPINE FINDINGS Alignment: Normal. Skull base and vertebrae: No acute fracture. No primary bone lesion or focal pathologic process. Soft tissues and spinal canal: No prevertebral fluid or swelling. No visible canal hematoma. Disc levels: Small disc protrusions at C4-C5 and C5-C6. Mild bilateral uncovertebral hypertrophy at C5-C6. Upper chest: Negative. Other: None. IMPRESSION: 1. No acute intracranial abnormality. 2. No acute cervical spine fracture or traumatic listhesis. Electronically Signed   By: Obie DredgeWilliam T Derry M.D.   On: 01/09/2021 09:24    Assessment & Plan:   Dietary folate deficiency anemia- H/H are normal. -     CBC with Differential/Platelet; Future  Essential hypertension- Her BP is well controlled. -     Urinalysis, Routine w reflex microscopic; Future -     Basic metabolic panel; Future -     TSH; Future -     Hepatic function panel; Future  Hyperlipidemia with target LDL less than 100 - LDL goal achieved. Doing well on the statin  -     Lipid panel; Future -     TSH; Future -     Hepatic function panel; Future  Type II diabetes mellitus with manifestations- Her blood sugar is well controlled. -     Urinalysis, Routine w reflex microscopic; Future -     Microalbumin / creatinine urine ratio; Future -     HM Diabetes Foot Exam -     Basic metabolic panel; Future -     Hemoglobin A1c; Future  Encounter for general adult medical examination with abnormal findings- Exam completed, labs reviewed, vaccines are up-to-date, no cancer screenings indicated, patient education was given.  Dysuria- UA and clx are normal. -      Urinalysis, Routine w reflex microscopic; Future -     CULTURE, URINE COMPREHENSIVE; Future  Vitamin D deficiency -     Vitamin D (Ergocalciferol); Take 1 capsule (50,000 Units total) by mouth every 7 (seven) days.  Dispense: 4 capsule; Refill: 0  Follow-up: Return in about 6 months (around 02/26/2023).  Sanda Linger, MD

## 2022-08-27 NOTE — Patient Instructions (Signed)

## 2022-08-28 MED ORDER — VITAMIN D (ERGOCALCIFEROL) 1.25 MG (50000 UNIT) PO CAPS: 50000.0000 [IU] | ORAL_CAPSULE | ORAL | 0 refills | Status: DC

## 2022-08-29 LAB — CULTURE, URINE COMPREHENSIVE

## 2022-09-03 ENCOUNTER — Other Ambulatory Visit (INDEPENDENT_AMBULATORY_CARE_PROVIDER_SITE_OTHER): Payer: Medicare Other

## 2022-09-03 DIAGNOSIS — D520 Dietary folate deficiency anemia: Secondary | ICD-10-CM | POA: Diagnosis not present

## 2022-09-03 DIAGNOSIS — E118 Type 2 diabetes mellitus with unspecified complications: Secondary | ICD-10-CM | POA: Diagnosis not present

## 2022-09-03 DIAGNOSIS — I10 Essential (primary) hypertension: Secondary | ICD-10-CM | POA: Diagnosis not present

## 2022-09-03 DIAGNOSIS — E785 Hyperlipidemia, unspecified: Secondary | ICD-10-CM | POA: Diagnosis not present

## 2022-09-03 LAB — CBC WITH DIFFERENTIAL/PLATELET
Basophils Absolute: 0 10*3/uL (ref 0.0–0.1)
Basophils Relative: 0.6 % (ref 0.0–3.0)
Eosinophils Absolute: 0.3 10*3/uL (ref 0.0–0.7)
Eosinophils Relative: 4 % (ref 0.0–5.0)
HCT: 38.4 % (ref 36.0–46.0)
Hemoglobin: 12.9 g/dL (ref 12.0–15.0)
Lymphocytes Relative: 28.7 % (ref 12.0–46.0)
Lymphs Abs: 2.4 10*3/uL (ref 0.7–4.0)
MCHC: 33.6 g/dL (ref 30.0–36.0)
MCV: 97.3 fl (ref 78.0–100.0)
Monocytes Absolute: 0.8 10*3/uL (ref 0.1–1.0)
Monocytes Relative: 9.7 % (ref 3.0–12.0)
Neutro Abs: 4.7 10*3/uL (ref 1.4–7.7)
Neutrophils Relative %: 57 % (ref 43.0–77.0)
Platelets: 241 10*3/uL (ref 150.0–400.0)
RBC: 3.94 Mil/uL (ref 3.87–5.11)
RDW: 13.4 % (ref 11.5–15.5)
WBC: 8.3 10*3/uL (ref 4.0–10.5)

## 2022-09-03 LAB — LIPID PANEL
Cholesterol: 158 mg/dL (ref 0–200)
HDL: 61.6 mg/dL (ref >39.00)
LDL Cholesterol: 76 mg/dL (ref 0–99)
NonHDL: 96.83
Total CHOL/HDL Ratio: 3
Triglycerides: 102 mg/dL (ref 0.0–149.0)
VLDL: 20.4 mg/dL (ref 0.0–40.0)

## 2022-09-03 LAB — HEPATIC FUNCTION PANEL
ALT: 10 U/L (ref 0–35)
AST: 18 U/L (ref 0–37)
Albumin: 4.4 g/dL (ref 3.5–5.2)
Alkaline Phosphatase: 57 U/L (ref 39–117)
Bilirubin, Direct: 0.1 mg/dL (ref 0.0–0.3)
Total Bilirubin: 0.6 mg/dL (ref 0.2–1.2)
Total Protein: 7.3 g/dL (ref 6.0–8.3)

## 2022-09-03 LAB — TSH: TSH: 1.77 u[IU]/mL (ref 0.35–5.50)

## 2022-09-03 LAB — BASIC METABOLIC PANEL
BUN: 13 mg/dL (ref 6–23)
CO2: 30 mEq/L (ref 19–32)
Calcium: 10.5 mg/dL (ref 8.4–10.5)
Chloride: 102 mEq/L (ref 96–112)
Creatinine, Ser: 0.77 mg/dL (ref 0.40–1.20)
GFR: 70.19 mL/min (ref >60.00)
Glucose, Bld: 98 mg/dL (ref 70–99)
Potassium: 4.5 mEq/L (ref 3.5–5.1)
Sodium: 140 mEq/L (ref 135–145)

## 2022-09-03 LAB — HEMOGLOBIN A1C: Hgb A1c MFr Bld: 5.3 % (ref 4.6–6.5)

## 2022-09-04 ENCOUNTER — Telehealth: Payer: Self-pay | Admitting: Internal Medicine

## 2022-09-04 NOTE — Telephone Encounter (Signed)
Patient daughter called regarding lab results on 08/27/22, shows patient has a UTI wanted to know if something was called in, did state patient sees a urologist and states patient is on an antibiotic but they did not know the name but states it is 500mg , not sure if that is helping with the UTI.

## 2022-09-06 ENCOUNTER — Other Ambulatory Visit: Payer: Self-pay | Admitting: Internal Medicine

## 2022-09-07 NOTE — Telephone Encounter (Signed)
Notified daughter (Mrs. Langston Masker) w/ MD response.Marland KitchenRaechel Chute

## 2022-10-16 ENCOUNTER — Other Ambulatory Visit: Payer: Self-pay | Admitting: Internal Medicine

## 2022-10-16 DIAGNOSIS — E559 Vitamin D deficiency, unspecified: Secondary | ICD-10-CM

## 2022-10-29 DIAGNOSIS — R8281 Pyuria: Secondary | ICD-10-CM | POA: Diagnosis not present

## 2022-10-29 DIAGNOSIS — Z4689 Encounter for fitting and adjustment of other specified devices: Secondary | ICD-10-CM | POA: Diagnosis not present

## 2022-10-29 DIAGNOSIS — N8111 Cystocele, midline: Secondary | ICD-10-CM | POA: Diagnosis not present

## 2022-11-02 ENCOUNTER — Other Ambulatory Visit: Payer: Self-pay | Admitting: Internal Medicine

## 2022-11-02 DIAGNOSIS — F411 Generalized anxiety disorder: Secondary | ICD-10-CM

## 2022-11-14 ENCOUNTER — Other Ambulatory Visit: Payer: Self-pay | Admitting: Internal Medicine

## 2022-11-14 DIAGNOSIS — E559 Vitamin D deficiency, unspecified: Secondary | ICD-10-CM

## 2022-11-24 ENCOUNTER — Other Ambulatory Visit: Payer: Self-pay | Admitting: Internal Medicine

## 2022-11-24 DIAGNOSIS — E785 Hyperlipidemia, unspecified: Secondary | ICD-10-CM

## 2022-12-08 ENCOUNTER — Other Ambulatory Visit: Payer: Self-pay | Admitting: Internal Medicine

## 2022-12-08 DIAGNOSIS — E559 Vitamin D deficiency, unspecified: Secondary | ICD-10-CM

## 2023-01-02 ENCOUNTER — Other Ambulatory Visit: Payer: Self-pay | Admitting: Internal Medicine

## 2023-01-02 DIAGNOSIS — E559 Vitamin D deficiency, unspecified: Secondary | ICD-10-CM

## 2023-01-28 ENCOUNTER — Telehealth: Payer: Self-pay | Admitting: Internal Medicine

## 2023-01-28 NOTE — Telephone Encounter (Signed)
Patient's daughter called and said her mother was underweight at her last appointment. They've been trying to help her gain weight, but she doesn't think the patient has been eating enough. They would like to know if they can get a referral to a dietician or nutritionist to help before her next appointment on 02/25/23. Best callback is [phone].

## 2023-01-29 ENCOUNTER — Other Ambulatory Visit: Payer: Self-pay | Admitting: Internal Medicine

## 2023-01-29 DIAGNOSIS — E559 Vitamin D deficiency, unspecified: Secondary | ICD-10-CM

## 2023-02-01 ENCOUNTER — Encounter: Payer: Self-pay | Admitting: Internal Medicine

## 2023-02-01 ENCOUNTER — Other Ambulatory Visit: Payer: Self-pay | Admitting: Internal Medicine

## 2023-02-01 DIAGNOSIS — E440 Moderate protein-calorie malnutrition: Secondary | ICD-10-CM

## 2023-02-01 DIAGNOSIS — R64 Cachexia: Secondary | ICD-10-CM | POA: Insufficient documentation

## 2023-02-01 MED ORDER — DRONABINOL 2.5 MG PO CAPS
2.5000 mg | ORAL_CAPSULE | Freq: Two times a day (BID) | ORAL | 0 refills | Status: AC
Start: 2023-02-01 — End: ?

## 2023-02-04 ENCOUNTER — Telehealth: Payer: Self-pay | Admitting: Internal Medicine

## 2023-02-04 DIAGNOSIS — Z4689 Encounter for fitting and adjustment of other specified devices: Secondary | ICD-10-CM | POA: Diagnosis not present

## 2023-02-04 NOTE — Telephone Encounter (Signed)
Pt daughter called stating that the medication was not cover by pt insurance and OOP would be 600 dollars and stated that they can't afford it. Pt is considering alternatives. Please call back with update.

## 2023-02-09 ENCOUNTER — Telehealth: Payer: Self-pay

## 2023-02-09 NOTE — Telephone Encounter (Signed)
Pharmacy Patient Advocate Encounter   Received notification from CoverMyMeds that prior authorization for droNABinol 2.5MG  capsules is required/requested.   Insurance verification completed.   The patient is insured through [facility] .   Per test claim: PA required; PA submitted to [facility] via CoverMyMeds Key/confirmation #/EOC [id number] Status is pending

## 2023-02-10 NOTE — Telephone Encounter (Signed)
Will auto denial in the system on sept 20th. REF # [id number]

## 2023-02-10 NOTE — Telephone Encounter (Signed)
Faxed additional information for prior authorization to [phone]

## 2023-02-11 ENCOUNTER — Encounter: Payer: Self-pay | Admitting: Radiology

## 2023-02-11 NOTE — Telephone Encounter (Signed)
Pharmacy Patient Advocate Encounter  Received notification from [facility] that Prior Authorization for Dronabinol Cap 2.5mg   has been DENIED.  Full denial letter will be uploaded to the media tab. See denial reason below.   PA #/Case ID/Reference #: [id number]   DENIAL REASON: Drugs when used for anorexia, weight loss, or weight gain are excluded from coverage under Medicare rules.

## 2023-02-21 ENCOUNTER — Other Ambulatory Visit: Payer: Self-pay | Admitting: Internal Medicine

## 2023-02-21 DIAGNOSIS — D520 Dietary folate deficiency anemia: Secondary | ICD-10-CM

## 2023-02-21 DIAGNOSIS — E559 Vitamin D deficiency, unspecified: Secondary | ICD-10-CM

## 2023-02-25 ENCOUNTER — Ambulatory Visit: Payer: Medicare Other | Admitting: Internal Medicine

## 2023-02-25 ENCOUNTER — Encounter: Payer: Self-pay | Admitting: Internal Medicine

## 2023-02-25 VITALS — BP 142/78 | HR 77 | Temp 97.9°F | Resp 16 | Ht 64.0 in | Wt 115.0 lb

## 2023-02-25 DIAGNOSIS — E118 Type 2 diabetes mellitus with unspecified complications: Secondary | ICD-10-CM | POA: Diagnosis not present

## 2023-02-25 DIAGNOSIS — Z23 Encounter for immunization: Secondary | ICD-10-CM | POA: Diagnosis not present

## 2023-02-25 DIAGNOSIS — I10 Essential (primary) hypertension: Secondary | ICD-10-CM

## 2023-02-25 LAB — CBC WITH DIFFERENTIAL/PLATELET
Basophils Absolute: 0 10*3/uL (ref 0.0–0.1)
Basophils Relative: 0.4 % (ref 0.0–3.0)
Eosinophils Absolute: 0.2 10*3/uL (ref 0.0–0.7)
Eosinophils Relative: 2 % (ref 0.0–5.0)
HCT: 36.3 % (ref 36.0–46.0)
Hemoglobin: 11.8 g/dL — ABNORMAL LOW (ref 12.0–15.0)
Lymphocytes Relative: 26.3 % (ref 12.0–46.0)
Lymphs Abs: 2.4 10*3/uL (ref 0.7–4.0)
MCHC: 32.5 g/dL (ref 30.0–36.0)
MCV: 99.4 fL (ref 78.0–100.0)
Monocytes Absolute: 0.8 10*3/uL (ref 0.1–1.0)
Monocytes Relative: 9.2 % (ref 3.0–12.0)
Neutro Abs: 5.7 10*3/uL (ref 1.4–7.7)
Neutrophils Relative %: 62.1 % (ref 43.0–77.0)
Platelets: 231 10*3/uL (ref 150.0–400.0)
RBC: 3.65 Mil/uL — ABNORMAL LOW (ref 3.87–5.11)
RDW: 13.4 % (ref 11.5–15.5)
WBC: 9.1 10*3/uL (ref 4.0–10.5)

## 2023-02-25 LAB — BASIC METABOLIC PANEL WITH GFR
BUN: 16 mg/dL (ref 6–23)
CO2: 29 meq/L (ref 19–32)
Calcium: 10.1 mg/dL (ref 8.4–10.5)
Chloride: 102 meq/L (ref 96–112)
Creatinine, Ser: 0.76 mg/dL (ref 0.40–1.20)
GFR: 71.06 mL/min
Glucose, Bld: 96 mg/dL (ref 70–99)
Potassium: 4.2 meq/L (ref 3.5–5.1)
Sodium: 138 meq/L (ref 135–145)

## 2023-02-25 LAB — HEMOGLOBIN A1C: Hgb A1c MFr Bld: 5.4 % (ref 4.6–6.5)

## 2023-02-25 NOTE — Progress Notes (Signed)
Subjective:  Patient ID: Nichole Silva, female    DOB: 1934-08-01  Age: 88 y.o. MRN: [id number]  CC: Hypertension   HPI Nichole Silva presents for f/up ----  Discussed the use of AI scribe software for clinical note transcription with the patient, who gave verbal consent to proceed.  History of Present Illness   The patient, with a history of Irritable Bowel Syndrome (IBS), reports an increase in symptoms, primarily characterized by soft, frequent stools. They note that these symptoms do not typically occur during the night, but when they do occur, they require immediate access to a bathroom. The patient also reports that the stools can sometimes be difficult to pass, describing it as a 'blockage.' They have been managing these symptoms with a medication taken three times a day before meals and occasional use of Imodium.  In terms of their general health, the patient reports feeling fine, but notes recent weight loss, which they are not comfortable with, preferring to maintain a weight in the 120s or 130s. They report good sleep and deny any dizziness or lightheadedness.  The patient has recently experienced multiple bereavements, including the death of their husband six months ago, and more recently, a friend and a relative. Despite these losses, the patient denies feeling depressed. They remain active, attending church twice a week and participating in various activities.       Outpatient Medications Prior to Visit  Medication Sig Dispense Refill   acetaminophen (TYLENOL) 325 MG tablet Take 325 mg by mouth every 6 (six) hours as needed for mild pain or moderate pain. Pain/headache     ALPRAZolam (XANAX) 0.25 MG tablet Take 1 tablet (0.25 mg total) by mouth 3 (three) times daily as needed for anxiety. 270 tablet 1   atorvastatin (LIPITOR) 40 MG tablet TAKE 1 TABLET(40 MG) BY MOUTH DAILY 90 tablet 0   BREO ELLIPTA 200-25 MCG/INH AEPB INHALE 1 PUFF BY MOUTH EVERY DAY 60 each 3   cetirizine  (ZYRTEC) 10 MG tablet Take 10 mg by mouth daily.     dicyclomine (BENTYL) 10 MG capsule Take 1 capsule (10 mg total) by mouth 3 (three) times daily before meals. 270 capsule 1   donepezil (ARICEPT) 5 MG tablet TAKE 1 TABLET(5 MG) BY MOUTH AT BEDTIME 90 tablet 1   fluticasone (FLONASE) 50 MCG/ACT nasal spray SHAKE LIQUID AND USE 2 SPRAYS IN EACH NOSTRIL AT BEDTIME 48 g 1   folic acid (FOLVITE) 1 MG tablet TAKE 1 TABLET(1 MG) BY MOUTH DAILY 90 tablet 1   losartan (COZAAR) 50 MG tablet TAKE 1 TABLET(50 MG) BY MOUTH DAILY 90 tablet 1   methenamine (HIPREX) 1 g tablet Take 1 tablet (1 g total) by mouth 2 (two) times daily with a meal. 180 tablet 1   nitrofurantoin, macrocrystal-monohydrate, (MACROBID) 100 MG capsule Take 100 mg by mouth 2 (two) times daily.     PROAIR HFA 108 (90 Base) MCG/ACT inhaler INHALE 1 TO 2 PUFFS BY MOUTH EVERY 4 HOURS AS NEEDED FOR WHEEZE OR SHORTNESS OF BREATH (Patient taking differently: Inhale 1-2 puffs into the lungs every 4 (four) hours as needed for wheezing or shortness of breath.) 8.5 Inhaler 11   vitamin B-12 (CYANOCOBALAMIN) 1000 MCG tablet Take 1,000 mcg by mouth daily.     Vitamin D, Ergocalciferol, (DRISDOL) 1.25 MG (50000 UNIT) CAPS capsule TAKE 1 CAPSULE BY MOUTH EVERY 7 DAYS 4 capsule 0   dronabinol (MARINOL) 2.5 MG capsule Take 1 capsule (2.5 mg total) by mouth 2 (  two) times daily before lunch and supper. 60 capsule 0   No facility-administered medications prior to visit.    ROS Review of Systems  Constitutional:  Negative for appetite change, fatigue and unexpected weight change.  HENT: Negative.    Eyes:  Negative for visual disturbance.  Respiratory:  Negative for cough, chest tightness, shortness of breath and wheezing.   Cardiovascular:  Negative for chest pain, palpitations and leg swelling.  Gastrointestinal:  Negative for abdominal pain, constipation, diarrhea and vomiting.  Genitourinary: Negative.   Musculoskeletal: Negative.   Skin: Negative.    Neurological: Negative.  Negative for dizziness and weakness.  Hematological:  Negative for adenopathy. Does not bruise/bleed easily.  Psychiatric/Behavioral:  Positive for confusion and decreased concentration. The patient is nervous/anxious.     Objective:  BP (!) 142/78 (BP Location: Right Arm, Patient Position: Sitting, Cuff Size: Large)   Pulse 77   Temp 97.9 F (36.6 C) (Oral)   Resp 16   Ht 5\' 4"  (1.626 m)   Wt 115 lb (52.2 kg)   SpO2 95%   BMI 19.74 kg/m   BP Readings from Last 3 Encounters:  02/25/23 (!) 142/78  08/27/22 136/84  12/03/21 126/72    Wt Readings from Last 3 Encounters:  02/25/23 115 lb (52.2 kg)  08/27/22 121 lb (54.9 kg)  12/03/21 134 lb (60.8 kg)    Physical Exam Vitals reviewed.  Constitutional:      Appearance: Normal appearance.  HENT:     Nose: Nose normal.     Mouth/Throat:     Mouth: Mucous membranes are moist.  Eyes:     General: No scleral icterus.    Conjunctiva/sclera: Conjunctivae normal.  Cardiovascular:     Rate and Rhythm: Normal rate and regular rhythm.     Heart sounds: No murmur heard.    No friction rub. No gallop.  Pulmonary:     Effort: Pulmonary effort is normal.     Breath sounds: No stridor. No wheezing, rhonchi or rales.  Abdominal:     General: Abdomen is flat.     Palpations: There is no mass.     Tenderness: There is no abdominal tenderness. There is no guarding.     Hernia: No hernia is present.  Musculoskeletal:        General: Normal range of motion.     Cervical back: Neck supple.     Right lower leg: No edema.     Left lower leg: No edema.  Lymphadenopathy:     Cervical: No cervical adenopathy.  Skin:    General: Skin is warm and dry.  Neurological:     General: No focal deficit present.     Mental Status: She is alert. Mental status is at baseline.  Psychiatric:        Mood and Affect: Mood normal.        Behavior: Behavior normal.     Lab Results  Component Value Date   WBC 9.1  02/25/2023   HGB 11.8 (L) 02/25/2023   HCT 36.3 02/25/2023   PLT 231.0 02/25/2023   GLUCOSE 96 02/25/2023   CHOL 158 09/03/2022   TRIG 102.0 09/03/2022   HDL 61.60 09/03/2022   LDLDIRECT 102.0 07/29/2015   LDLCALC 76 09/03/2022   ALT 10 09/03/2022   AST 18 09/03/2022   NA 138 02/25/2023   K 4.2 02/25/2023   CL 102 02/25/2023   CREATININE 0.76 02/25/2023   BUN 16 02/25/2023   CO2 29 02/25/2023  TSH 1.77 09/03/2022   HGBA1C 5.4 02/25/2023   MICROALBUR 4.2 (H) 08/27/2022    DG Ribs Unilateral W/Chest Left  Result Date: 01/09/2021 CLINICAL DATA:  Status post fall.  Pain EXAM: LEFT RIBS AND CHEST - 3+ VIEW COMPARISON:  04/06/2019 FINDINGS: No fracture or other bone lesions are seen involving the ribs. There is no evidence of pneumothorax or pleural effusion. Lung volumes are low. There is mild platelike atelectasis in the lung bases. Heart size and mediastinal contours are within normal limits. IMPRESSION: 1. No rib fractures identified. 2. Low lung volumes and bibasilar atelectasis. Electronically Signed   By: Signa Kell M.D.   On: 01/09/2021 09:49   DG Pelvis 1-2 Views  Result Date: 01/09/2021 CLINICAL DATA:  Fall. EXAM: PELVIS - 1-2 VIEW COMPARISON:  CT abdomen pelvis dated May 27, 2016. FINDINGS: There is no evidence of pelvic fracture or diastasis. No pelvic bone lesions are seen. IMPRESSION: Negative. Electronically Signed   By: Obie Dredge M.D.   On: 01/09/2021 09:48   CT Head Wo Contrast  Result Date: 01/09/2021 CLINICAL DATA:  Fall. EXAM: CT HEAD WITHOUT CONTRAST CT CERVICAL SPINE WITHOUT CONTRAST TECHNIQUE: Multidetector CT imaging of the head and cervical spine was performed following the standard protocol without intravenous contrast. Multiplanar CT image reconstructions of the cervical spine were also generated. COMPARISON:  CT head dated May 27, 2016. FINDINGS: CT HEAD FINDINGS Brain: No evidence of acute infarction, hemorrhage, hydrocephalus, extra-axial  collection or mass lesion/mass effect. Stable mild atrophy and chronic microvascular ischemic changes. Vascular: Atherosclerotic vascular calcification of the carotid siphons. No hyperdense vessel. Skull: Normal. Negative for fracture or focal lesion. Sinuses/Orbits: No acute finding. Other: None. CT CERVICAL SPINE FINDINGS Alignment: Normal. Skull base and vertebrae: No acute fracture. No primary bone lesion or focal pathologic process. Soft tissues and spinal canal: No prevertebral fluid or swelling. No visible canal hematoma. Disc levels: Small disc protrusions at C4-C5 and C5-C6. Mild bilateral uncovertebral hypertrophy at C5-C6. Upper chest: Negative. Other: None. IMPRESSION: 1. No acute intracranial abnormality. 2. No acute cervical spine fracture or traumatic listhesis. Electronically Signed   By: Obie Dredge M.D.   On: 01/09/2021 09:24   CT Cervical Spine Wo Contrast  Result Date: 01/09/2021 CLINICAL DATA:  Fall. EXAM: CT HEAD WITHOUT CONTRAST CT CERVICAL SPINE WITHOUT CONTRAST TECHNIQUE: Multidetector CT imaging of the head and cervical spine was performed following the standard protocol without intravenous contrast. Multiplanar CT image reconstructions of the cervical spine were also generated. COMPARISON:  CT head dated May 27, 2016. FINDINGS: CT HEAD FINDINGS Brain: No evidence of acute infarction, hemorrhage, hydrocephalus, extra-axial collection or mass lesion/mass effect. Stable mild atrophy and chronic microvascular ischemic changes. Vascular: Atherosclerotic vascular calcification of the carotid siphons. No hyperdense vessel. Skull: Normal. Negative for fracture or focal lesion. Sinuses/Orbits: No acute finding. Other: None. CT CERVICAL SPINE FINDINGS Alignment: Normal. Skull base and vertebrae: No acute fracture. No primary bone lesion or focal pathologic process. Soft tissues and spinal canal: No prevertebral fluid or swelling. No visible canal hematoma. Disc levels: Small disc  protrusions at C4-C5 and C5-C6. Mild bilateral uncovertebral hypertrophy at C5-C6. Upper chest: Negative. Other: None. IMPRESSION: 1. No acute intracranial abnormality. 2. No acute cervical spine fracture or traumatic listhesis. Electronically Signed   By: Obie Dredge M.D.   On: 01/09/2021 09:24    Assessment & Plan:   Type II diabetes mellitus with manifestations (HCC) - Her blood sugar is well controlled. -     Hemoglobin  A1c; Future -     Basic metabolic panel; Future  Essential hypertension - Her BP is adequately well controlled. -     Basic metabolic panel; Future -     CBC with Differential/Platelet; Future  Flu vaccine need -     Flu Vaccine Trivalent High Dose (Fluad)     Follow-up: Return in about 6 months (around 08/26/2023).  Sanda Linger, MD

## 2023-03-01 ENCOUNTER — Other Ambulatory Visit: Payer: Self-pay | Admitting: Internal Medicine

## 2023-03-01 DIAGNOSIS — E785 Hyperlipidemia, unspecified: Secondary | ICD-10-CM

## 2023-03-01 DIAGNOSIS — I10 Essential (primary) hypertension: Secondary | ICD-10-CM

## 2023-03-07 ENCOUNTER — Other Ambulatory Visit: Payer: Self-pay | Admitting: Internal Medicine

## 2023-03-07 DIAGNOSIS — F0390 Unspecified dementia without behavioral disturbance: Secondary | ICD-10-CM

## 2023-03-12 ENCOUNTER — Ambulatory Visit: Payer: Medicare Other | Admitting: Gastroenterology

## 2023-03-12 ENCOUNTER — Telehealth: Payer: Self-pay | Admitting: Gastroenterology

## 2023-03-12 ENCOUNTER — Encounter: Payer: Self-pay | Admitting: Gastroenterology

## 2023-03-12 VITALS — BP 118/62 | HR 68 | Ht 62.0 in | Wt 112.0 lb

## 2023-03-12 DIAGNOSIS — R159 Full incontinence of feces: Secondary | ICD-10-CM | POA: Diagnosis not present

## 2023-03-12 DIAGNOSIS — R197 Diarrhea, unspecified: Secondary | ICD-10-CM

## 2023-03-12 DIAGNOSIS — R634 Abnormal weight loss: Secondary | ICD-10-CM | POA: Diagnosis not present

## 2023-03-12 NOTE — Patient Instructions (Signed)
Start Metamucil daily.   You have been scheduled for a CT scan of the abdomen and pelvis at [facility], 1st floor Radiology. You are scheduled on Friday 08/17/22 at 3:30 pm. Please arrive 1:15 pm prior to your appointment time for registration and to drink oral contrast.  Please follow the written instructions below on the day of your exam:   1) Do not eat anything after 11:30 am (4 hours prior to your test)   You may take any medications as prescribed with a small amount of water, if necessary. If you take any of the following medications: METFORMIN, GLUCOPHAGE, GLUCOVANCE, AVANDAMET, RIOMET, FORTAMET, ACTOPLUS MET, JANUMET, GLUMETZA or METAGLIP, you MAY be asked to HOLD this medication 48 hours AFTER the exam.   The purpose of you drinking the oral contrast is to aid in the visualization of your intestinal tract. The contrast solution may cause some diarrhea. Depending on your individual set of symptoms, you may also receive an intravenous injection of x-ray contrast/dye. Plan on being at [facility] for 45 minutes or longer, depending on the type of exam you are having performed.   If you have any questions regarding your exam or if you need to reschedule, you may call [facility] at [phone] between the hours of 8:00 am and 5:00 pm, Monday-Friday.

## 2023-03-12 NOTE — Telephone Encounter (Signed)
Inbound call from patient's daughter stating she saw that there was medication change, wanted to ensure no new medication was called in for patient. Also requesting to know if there will be a co pay for imaging appointment. Requesting a call back. Please advise, thank you.

## 2023-03-12 NOTE — Progress Notes (Unsigned)
HPI : Nichole Silva is a 88 y.o. female with a history of IBS, anxiety, diabetes and hypertension who is referred to [place] by Etta Grandchild, MD for further evaluation and management of IBS symptoms and weight loss.  The patient states that she has lost a lot of weight over the past year or so.  She states that her appetite is not great.  She usually eats a good breakfast (oatmeal, 2 boiled eggs, fruit), but after that she usually has to force herself to eat.  She also drinks 2 boost per day.  She typically has a soup or sandwich for dinner or lunch. She denies any abdominal pain and reports that her lack of appetite is not related to pain with eating.  She denies any nausea or vomiting when she eats. She sometimes has diarrhea, but most the time of her stool is formed.  She does have issues with urgency and incontinence.  This has been a longstanding problem (underwent a colonoscopy in 2002 because of the symptom).   The incontinence is not daily, but it is enough to interfere with her activities.  She limits her out-of-home activities because of incontinence.  She does take Imodium when she is going to leave the house, usually she takes this before church, which she attends twice a week. Constipation is rarely a problem for her, but when she does go a day or 2 without a bowel movement she will take a stool softener which helps. Her husband died in 2022-09-03, after 66 years of marriage.  She currently weighs 112lbs.  She weighted 121 lbs in April 2024, 134lbs in July 2023.  Her weight is typically in the 120s-130lb. The patient lives with her daughter.  She has 2 other daughters who also help take care of her.  Her daughter manages her medications  She has no family history of GI malignancy.  She has had 2 colonoscopies, most recently in 2014.  No history of polyps.   Colonoscopy Jan 2014 (Dr. Juanda Chance) Moderate sigmoid diverticulosis, otherwise normal.  Colonoscopy Feb 2002 (Dr. Juanda Chance) Indications:  Stool incontinence/urgency Sigmoid diverticulosis, otherwise normal.  Past Medical History:  Diagnosis Date   Allergic rhinitis, cause unspecified    Anxiety state, unspecified    Asthma    Coronary atherosclerosis of unspecified type of vessel, native or graft    Depression    Female bladder prolapse    Hyperlipidemia    IBS (irritable bowel syndrome)    Osteoarthrosis, unspecified whether generalized or localized, unspecified site    Type II or unspecified type diabetes mellitus without mention of complication, not stated as uncontrolled    Unspecified chronic bronchitis (HCC)    Unspecified essential hypertension    Unspecified hearing loss    Unspecified venous (peripheral) insufficiency    Unspecified vitamin D deficiency      Past Surgical History:  Procedure Laterality Date   Cataract surg  2009   CHOLECYSTECTOMY  1997   DILATION AND CURETTAGE OF UTERUS  1960's   Left elbow surgery  1992   Family History  Problem Relation Age of Onset   Emphysema Mother    Heart disease Father    Colon cancer Sister    Colon cancer Other    Pancreatic cancer Other    Social History   Tobacco Use   Smoking status: Never   Smokeless tobacco: Never  Vaping Use   Vaping status: Never Used  Substance Use Topics   Alcohol use: No  Alcohol/week: 0.0 standard drinks of alcohol   Drug use: No   Current Outpatient Medications  Medication Sig Dispense Refill   acetaminophen (TYLENOL) 325 MG tablet Take 325 mg by mouth every 6 (six) hours as needed for mild pain or moderate pain. Pain/headache     ALPRAZolam (XANAX) 0.25 MG tablet Take 1 tablet (0.25 mg total) by mouth 3 (three) times daily as needed for anxiety. 270 tablet 1   atorvastatin (LIPITOR) 40 MG tablet TAKE 1 TABLET(40 MG) BY MOUTH DAILY 90 tablet 0   BREO ELLIPTA 200-25 MCG/INH AEPB INHALE 1 PUFF BY MOUTH EVERY DAY 60 each 3   cetirizine (ZYRTEC) 10 MG tablet Take 10 mg by mouth daily.     dicyclomine (BENTYL) 10 MG  capsule Take 1 capsule (10 mg total) by mouth 3 (three) times daily before meals. 270 capsule 1   donepezil (ARICEPT) 5 MG tablet TAKE 1 TABLET(5 MG) BY MOUTH AT BEDTIME 90 tablet 1   fluticasone (FLONASE) 50 MCG/ACT nasal spray SHAKE LIQUID AND USE 2 SPRAYS IN EACH NOSTRIL AT BEDTIME 48 g 1   folic acid (FOLVITE) 1 MG tablet TAKE 1 TABLET(1 MG) BY MOUTH DAILY 90 tablet 1   losartan (COZAAR) 50 MG tablet TAKE 1 TABLET(50 MG) BY MOUTH DAILY 90 tablet 0   methenamine (HIPREX) 1 g tablet Take 1 tablet (1 g total) by mouth 2 (two) times daily with a meal. 180 tablet 1   nitrofurantoin, macrocrystal-monohydrate, (MACROBID) 100 MG capsule Take 100 mg by mouth 2 (two) times daily.     PROAIR HFA 108 (90 Base) MCG/ACT inhaler INHALE 1 TO 2 PUFFS BY MOUTH EVERY 4 HOURS AS NEEDED FOR WHEEZE OR SHORTNESS OF BREATH (Patient taking differently: Inhale 1-2 puffs into the lungs every 4 (four) hours as needed for wheezing or shortness of breath.) 8.5 Inhaler 11   vitamin B-12 (CYANOCOBALAMIN) 1000 MCG tablet Take 1,000 mcg by mouth daily.     Vitamin D, Ergocalciferol, (DRISDOL) 1.25 MG (50000 UNIT) CAPS capsule TAKE 1 CAPSULE BY MOUTH EVERY 7 DAYS 4 capsule 0   No current facility-administered medications for this visit.   Allergies  Allergen Reactions   Nifedipine Swelling   Codeine     Unknown per pt    Metformin And Related     N&V   Morphine And Codeine    Other Diarrhea    Shelled beans, severe IBS   Penicillins     REACTION: ITCHING AND SWELLING     Review of Systems: All systems reviewed and negative except where noted in HPI.    No results found.  Physical Exam: BP 118/62   Pulse 68   Ht 5\' 2"  (1.575 m)   Wt 112 lb (50.8 kg)   BMI 20.49 kg/m  Constitutional: Pleasant,well-developed, frail elderly Caucasian female in no acute distress.  Kyphotic.  Hard of hearing. HEENT: Normocephalic and atraumatic. Conjunctivae are normal. No scleral icterus. Cardiovascular: Normal rate, regular  rhythm.  Pulmonary/chest: Effort normal and breath sounds normal. No wheezing, rales or rhonchi. Abdominal: Soft, nondistended, nontender. Bowel sounds active throughout. There are no masses palpable. No hepatomegaly. Extremities: no edema Neurological: Alert and oriented to person place and time. Skin: Skin is warm and dry. No rashes noted. Psychiatric: Normal mood and affect. Behavior is normal.  CBC    Component Value Date/Time   WBC 9.1 02/25/2023 1405   RBC 3.65 (L) 02/25/2023 1405   HGB 11.8 (L) 02/25/2023 1405   HCT 36.3 02/25/2023 1405  PLT 231.0 02/25/2023 1405   MCV 99.4 02/25/2023 1405   MCH 31.0 01/12/2021 0121   MCHC 32.5 02/25/2023 1405   RDW 13.4 02/25/2023 1405   LYMPHSABS 2.4 02/25/2023 1405   MONOABS 0.8 02/25/2023 1405   EOSABS 0.2 02/25/2023 1405   BASOSABS 0.0 02/25/2023 1405    CMP     Component Value Date/Time   NA 138 02/25/2023 1405   K 4.2 02/25/2023 1405   CL 102 02/25/2023 1405   CO2 29 02/25/2023 1405   GLUCOSE 96 02/25/2023 1405   BUN 16 02/25/2023 1405   CREATININE 0.76 02/25/2023 1405   CALCIUM 10.1 02/25/2023 1405   PROT 7.3 09/03/2022 0944   ALBUMIN 4.4 09/03/2022 0944   AST 18 09/03/2022 0944   ALT 10 09/03/2022 0944   ALKPHOS 57 09/03/2022 0944   BILITOT 0.6 09/03/2022 0944   GFRNONAA >60 01/12/2021 0121   GFRAA >60 04/06/2019 1535       Latest Ref Rng & Units 02/25/2023    2:05 PM 09/03/2022    9:44 AM 02/27/2021    2:28 PM  CBC EXTENDED  WBC 4.0 - 10.5 K/uL 9.1  8.3  10.6   RBC 3.87 - 5.11 Mil/uL 3.65  3.94  3.96   Hemoglobin 12.0 - 15.0 g/dL 16.1  09.6  04.5   HCT 36.0 - 46.0 % 36.3  38.4  37.1   Platelets 150.0 - 400.0 K/uL 231.0  241.0  371.0   NEUT# 1.4 - 7.7 K/uL 5.7  4.7  7.1   Lymph# 0.7 - 4.0 K/uL 2.4  2.4  2.3       ASSESSMENT AND PLAN: 88 year old female with history of IBS and anxiety, with 20 pounds of unintentional weight loss over the past year.  No significant change in her bowel habits.  She has  longstanding issues with occasional diarrhea and incontinence, and this has not changed.  No significant abdominal pain.  No early satiety, nausea or vomiting. Patient's weight loss may be related to age as well as underlying depression in the setting of losing her spouse of 41 years.  Low suspicion for colon cancer given her previously normal colonoscopies but no polyps and no significant changes in bowel habits.  Would not recommend a colonoscopy given the risk/invasive nature of the procedure.  I think a CT of the abdomen and pelvis is reasonable to rule out obvious intra-abdominal malignancy.  Patient has no smoking history and no pulmonary symptoms, so CT chest likely not necessary. With regards to her incontinence, this is most likely related to age/sphincter atrophy.  I recommended she try taking Metamucil to improve her stool bulk and consistency.  If she continues to have frequent problems with incontinence with this intervention, we can consider physical therapy.  Agree with her continuing to use Imodium on a as needed basis for when she will be going out of the house.  Although she is status postcholecystectomy and certainly at risk for bile acid diarrhea, she does not complain of frequent diarrhea that would warrant bile acid sequestrant therapy.  Unintentional Weight loss - CT abdomen/pelvis -Defer EGD and colonoscopy for now given patient's frail state/risk of sedation  Fecal incontinence - Daily metamucil - Continue PRN Imodium - F/u 2-3 months; refer to PT if no improvement  Diarrhea - Not frequent enough to warrant bile sequestrants  I spent a total of 45 minutes reviewing the patient's medical record, interviewing and examining the patient, discussing her diagnosis and management of her condition  going forward, and documenting in the medical record  Jamilette Suchocki E. Tomasa Rand, MD [facility]   Etta Grandchild, MD

## 2023-03-15 NOTE — Telephone Encounter (Signed)
Patient daughter informed no changes to patient's medications. Also let her know she will need to call the hospital for any copay with imaging.

## 2023-03-15 NOTE — Telephone Encounter (Signed)
Left message for patient to call office.  

## 2023-03-19 ENCOUNTER — Ambulatory Visit (HOSPITAL_COMMUNITY)
Admission: RE | Admit: 2023-03-19 | Discharge: 2023-03-19 | Disposition: A | Discharge status: Home / Self Care | Payer: Medicare Other | Source: Ambulatory Visit | Attending: Gastroenterology | Admitting: Gastroenterology

## 2023-03-19 DIAGNOSIS — R634 Abnormal weight loss: Secondary | ICD-10-CM | POA: Insufficient documentation

## 2023-03-19 DIAGNOSIS — K5730 Diverticulosis of large intestine without perforation or abscess without bleeding: Secondary | ICD-10-CM | POA: Diagnosis not present

## 2023-03-19 DIAGNOSIS — N281 Cyst of kidney, acquired: Secondary | ICD-10-CM | POA: Diagnosis not present

## 2023-03-19 MED ORDER — IOHEXOL 9 MG/ML PO SOLN
ORAL | Status: AC
  Filled 2023-03-19: qty 1000

## 2023-03-19 MED ORDER — IOHEXOL 9 MG/ML PO SOLN
500.0000 mL | ORAL | Status: AC
  Administered 2023-03-19 (×2): 500 mL via ORAL

## 2023-03-19 MED ORDER — IOHEXOL 300 MG/ML  SOLN
100.0000 mL | Freq: Once | INTRAMUSCULAR | Status: AC | PRN
  Administered 2023-03-19: 100 mL via INTRAVENOUS

## 2023-03-23 ENCOUNTER — Other Ambulatory Visit: Payer: Self-pay | Admitting: Internal Medicine

## 2023-03-23 DIAGNOSIS — E559 Vitamin D deficiency, unspecified: Secondary | ICD-10-CM

## 2023-03-29 ENCOUNTER — Telehealth: Payer: Self-pay | Admitting: Gastroenterology

## 2023-03-29 NOTE — Telephone Encounter (Signed)
Spoke with pts daughter and let her know the results are not back yet, we will let her know when the results are back.

## 2023-03-29 NOTE — Telephone Encounter (Signed)
Inbound call from daughter, is calling to follow up on CT scan results. States they have not received any information in regards. She also states patient has been on long term antibiotics and are inquiring if this can be the cause for the issues. She states the patient spoke with Dr. Tomasa Rand about potentially referring her out to physical therapy. Please advise.

## 2023-04-14 ENCOUNTER — Other Ambulatory Visit: Payer: Self-pay

## 2023-04-14 ENCOUNTER — Encounter: Payer: Self-pay | Admitting: Gastroenterology

## 2023-04-14 DIAGNOSIS — N390 Urinary tract infection, site not specified: Secondary | ICD-10-CM

## 2023-04-14 NOTE — Progress Notes (Signed)
Ms. Surrency,  Your CT scan did not show any concerning findings for malignancy to explain your weight loss.  This is good news. Your bladder did appear to be thickened, which may indicate the presence of a chronic infection.  I would like to obtain a urinalysis to see if there is any evidence of infection.  Bonita Quin, Can you please order a urinalysis?

## 2023-04-16 ENCOUNTER — Other Ambulatory Visit (INDEPENDENT_AMBULATORY_CARE_PROVIDER_SITE_OTHER): Payer: Medicare Other

## 2023-04-16 DIAGNOSIS — N390 Urinary tract infection, site not specified: Secondary | ICD-10-CM | POA: Diagnosis not present

## 2023-04-16 LAB — URINALYSIS, ROUTINE W REFLEX MICROSCOPIC
Bilirubin Urine: NEGATIVE
Nitrite: NEGATIVE
Specific Gravity, Urine: 1.015 (ref 1.000–1.030)
Total Protein, Urine: NEGATIVE
Urine Glucose: NEGATIVE
Urobilinogen, UA: 0.2 (ref 0.0–1.0)
pH: 6 (ref 5.0–8.0)

## 2023-04-19 NOTE — Telephone Encounter (Signed)
Patient daughter requesting to speak with a nurse in regards to previous note. Please advise.

## 2023-04-19 NOTE — Telephone Encounter (Signed)
Inbound call from patient's daughter stating patient had a urine analysis on 11/22 for Dr. Tomasa Rand. Patient's daughter requesting a call back at [phone] to be advised. States if patient does need another one she will bring her in tomorrow. Also states patient has not been taking antibiotic due to cost of prescription. Please advise, thank you.

## 2023-04-26 ENCOUNTER — Other Ambulatory Visit: Payer: Self-pay | Admitting: Internal Medicine

## 2023-04-26 DIAGNOSIS — E559 Vitamin D deficiency, unspecified: Secondary | ICD-10-CM

## 2023-04-30 ENCOUNTER — Other Ambulatory Visit: Payer: Self-pay | Admitting: Internal Medicine

## 2023-04-30 DIAGNOSIS — F411 Generalized anxiety disorder: Secondary | ICD-10-CM

## 2023-05-06 ENCOUNTER — Telehealth: Payer: Self-pay | Admitting: Internal Medicine

## 2023-05-06 DIAGNOSIS — Z4689 Encounter for fitting and adjustment of other specified devices: Secondary | ICD-10-CM | POA: Diagnosis not present

## 2023-05-06 NOTE — Telephone Encounter (Signed)
Patient was told by one of her other providers that she needs a referral to see a urogynecologist named Dr. Lanetta Inch at [facility]. Best callback is [phone].

## 2023-05-06 NOTE — Telephone Encounter (Signed)
Can you place this referral for her ? She seen her GYN Mitchel Honour, MD today and told her to give [place] the call.

## 2023-05-09 ENCOUNTER — Other Ambulatory Visit: Payer: Self-pay | Admitting: Internal Medicine

## 2023-05-09 DIAGNOSIS — N390 Urinary tract infection, site not specified: Secondary | ICD-10-CM

## 2023-05-13 ENCOUNTER — Telehealth: Payer: Self-pay | Admitting: Internal Medicine

## 2023-05-13 NOTE — Telephone Encounter (Signed)
Copied from CRM [id number]. Topic: Clinical - Medication Refill >> May 13, 2023  4:26 PM Irine Seal wrote: Most Recent Primary Care Visit:  Provider: Etta Grandchild  Department: [facility] [place]  Visit Type: OFFICE VISIT  Date: 02/25/2023  Medication: ALPRAZolam Prudy Feeler) 0.25 MG  Has the patient contacted their pharmacy? Yes (Agent: If no, request that the patient contact the pharmacy for the refill. If patient does not wish to contact the pharmacy document the reason why and proceed with request.) (Agent: If yes, when and what did the pharmacy advise?)  Is this the correct pharmacy for this prescription? Yes If no, delete pharmacy and type the correct one.  This is the patient's preferred pharmacy:   [id number] [place], [place] - [phone] AT [facility] OF [facility] & [address] [place] [address] Phone: [phone] Fax: [phone]     Has the prescription been filled recently? No  Is the patient out of the medication? Yes  Has the patient been seen for an appointment in the last year OR does the patient have an upcoming appointment? Yes  Can we respond through MyChart? Yes  Agent: Please be advised that Rx refills may take up to 3 business days. We ask that you follow-up with your pharmacy.

## 2023-05-13 NOTE — Telephone Encounter (Signed)
Copied from CRM [id number]. Topic: Clinical - Medication Refill >> May 13, 2023  4:29 PM Irine Seal wrote: Most Recent Primary Care Visit:  Provider: Etta Grandchild  Department: [facility] [place]  Visit Type: OFFICE VISIT  Date: 02/25/2023  Medication: ALPRAZolam Prudy Feeler) 0.25 MG  Has the patient contacted their pharmacy? Yes (Agent: If no, request that the patient contact the pharmacy for the refill. If patient does not wish to contact the pharmacy document the reason why and proceed with request.) (Agent: If yes, when and what did the pharmacy advise?)  Is this the correct pharmacy for this prescription? Yes If no, delete pharmacy and type the correct one.  This is the patient's preferred pharmacy:  [id number] [place], [place] - [address] AT [facility] OF [facility] & [address] [place] [address] Phone: [phone] Fax: [phone]  [facility] [place], [place] - [address] [place] [address] Phone: [phone] Fax: [phone]   Has the prescription been filled recently? No  Is the patient out of the medication? Yes  Has the patient been seen for an appointment in the last year OR does the patient have an upcoming appointment? Yes  Can we respond through MyChart? Yes  Agent: Please be advised that Rx refills may take up to 3 business days. We ask that you follow-up with your pharmacy.

## 2023-05-18 ENCOUNTER — Other Ambulatory Visit: Payer: Self-pay | Admitting: Internal Medicine

## 2023-05-18 DIAGNOSIS — F419 Anxiety disorder, unspecified: Secondary | ICD-10-CM

## 2023-05-21 ENCOUNTER — Other Ambulatory Visit: Payer: Self-pay | Admitting: Internal Medicine

## 2023-05-21 DIAGNOSIS — E559 Vitamin D deficiency, unspecified: Secondary | ICD-10-CM

## 2023-05-25 ENCOUNTER — Other Ambulatory Visit: Payer: Self-pay | Admitting: Internal Medicine

## 2023-05-25 DIAGNOSIS — I10 Essential (primary) hypertension: Secondary | ICD-10-CM

## 2023-06-07 ENCOUNTER — Other Ambulatory Visit: Payer: Self-pay | Admitting: Internal Medicine

## 2023-06-07 DIAGNOSIS — E785 Hyperlipidemia, unspecified: Secondary | ICD-10-CM

## 2023-06-10 ENCOUNTER — Ambulatory Visit: Payer: Medicare Other | Admitting: Gastroenterology

## 2023-06-10 ENCOUNTER — Encounter: Payer: Self-pay | Admitting: Gastroenterology

## 2023-06-10 ENCOUNTER — Telehealth: Payer: Self-pay | Admitting: Gastroenterology

## 2023-06-10 VITALS — BP 118/62 | HR 65 | Ht 64.0 in | Wt 108.0 lb

## 2023-06-10 DIAGNOSIS — R159 Full incontinence of feces: Secondary | ICD-10-CM | POA: Diagnosis not present

## 2023-06-10 DIAGNOSIS — R634 Abnormal weight loss: Secondary | ICD-10-CM | POA: Diagnosis not present

## 2023-06-10 DIAGNOSIS — F4321 Adjustment disorder with depressed mood: Secondary | ICD-10-CM

## 2023-06-10 MED ORDER — MIRTAZAPINE 15 MG PO TABS: 15.0000 mg | ORAL_TABLET | Freq: Every day | ORAL | 1 refills | Status: AC

## 2023-06-10 NOTE — Progress Notes (Signed)
Discussed the use of AI scribe software for clinical note transcription with the patient, who gave verbal consent to proceed.  HPI : Nichole Silva is a 89 y.o. female with a history of anxiety, IBS, diabetes and hypertension who presents for follow-up of chronic diarrhea, fecal incontinence and recent weight loss.  She was last seen by me in October with complaints of episodic incontinence which affected her quality of life.  She has had persistent weight loss over the past 2 years going from an average of 120-130 pounds to her current weight of 108 pounds.  At her last visit in October, she weighed 112 pounds.  At that visit, a CT of the abdomen and pelvis was ordered which did not show any evidence of malignancy or other cause of her weight loss.  We had discussed doing an upper/lower endoscopy to exclude GI malignancy, but decided against this given the risks of the procedure and the low likelihood of finding a malignancy to explain her 2 years of weight loss.  Today she reports consuming a large breakfast daily, typically consisting of eggs, oatmeal, and fresh fruit, and a variable lunch, sometimes including a whole hamburger. She notes a decreased interest in bread and sweets, which she used to enjoy. The patient also mentions a significant decrease in her consumption of soda.  She consumes 1 or 2 chocolate Boost shakes per day She states that these foods just do not taste as good, so she does not eat them as much.  She tells me that her husband passed away within the last year after 68 years of marriage.  She feels like losing her husband has contributed to her decline in health.  Although the patient wishes that she would regain some of the lost weight, she tells me she is not concerned about her weight loss, and attributes the loss of weight to overall aging and decreased appetite.  She has continued to experience episodes of fecal incontinence approximately once a month, often in public places,  which she manages with Imodium and a fiber supplement. She describes these episodes as sudden and uncontrollable, with stool consistency being soft.  She will often take Imodium prior to going outside the house, and this is helpful.  This has been going on for years.  She has been taking a fiber supplement daily.  The patient remains active, attending church twice a week and receiving assistance from her retired daughter. She expresses a desire to regain some weight and improve her overall health.       Past Medical History:  Diagnosis Date   Allergic rhinitis, cause unspecified    Anxiety state, unspecified    Asthma    Coronary atherosclerosis of unspecified type of vessel, native or graft    Depression    Female bladder prolapse    Hyperlipidemia    IBS (irritable bowel syndrome)    Osteoarthrosis, unspecified whether generalized or localized, unspecified site    Type II or unspecified type diabetes mellitus without mention of complication, not stated as uncontrolled    Unspecified chronic bronchitis (HCC)    Unspecified essential hypertension    Unspecified hearing loss    Unspecified venous (peripheral) insufficiency    Unspecified vitamin D deficiency      Past Surgical History:  Procedure Laterality Date   Cataract surg  2009   CHOLECYSTECTOMY  1997   DILATION AND CURETTAGE OF UTERUS  1960's   Left elbow surgery  1992   Family History  Problem  Relation Age of Onset   Emphysema Mother    Heart disease Father    Colon cancer Sister    Colon cancer Other    Liver disease Other    Esophageal cancer Other    Social History   Tobacco Use   Smoking status: Never   Smokeless tobacco: Never  Vaping Use   Vaping status: Never Used  Substance Use Topics   Alcohol use: No    Alcohol/week: 0.0 standard drinks of alcohol   Drug use: No   Current Outpatient Medications  Medication Sig Dispense Refill   acetaminophen (TYLENOL) 325 MG tablet Take 325 mg by mouth every 6  (six) hours as needed for mild pain or moderate pain. Pain/headache     atorvastatin (LIPITOR) 40 MG tablet TAKE 1 TABLET(40 MG) BY MOUTH DAILY 90 tablet 0   BREO ELLIPTA 200-25 MCG/INH AEPB INHALE 1 PUFF BY MOUTH EVERY DAY 60 each 3   cetirizine (ZYRTEC) 10 MG tablet Take 10 mg by mouth daily.     dicyclomine (BENTYL) 10 MG capsule Take 1 capsule (10 mg total) by mouth 3 (three) times daily before meals. 270 capsule 1   donepezil (ARICEPT) 5 MG tablet TAKE 1 TABLET(5 MG) BY MOUTH AT BEDTIME 90 tablet 1   fluticasone (FLONASE) 50 MCG/ACT nasal spray SHAKE LIQUID AND USE 2 SPRAYS IN EACH NOSTRIL AT BEDTIME 48 g 1   folic acid (FOLVITE) 1 MG tablet TAKE 1 TABLET(1 MG) BY MOUTH DAILY 90 tablet 1   losartan (COZAAR) 50 MG tablet TAKE 1 TABLET(50 MG) BY MOUTH DAILY 90 tablet 0   methenamine (HIPREX) 1 g tablet Take 1 tablet (1 g total) by mouth 2 (two) times daily with a meal. 180 tablet 1   nitrofurantoin, macrocrystal-monohydrate, (MACROBID) 100 MG capsule Take 100 mg by mouth 2 (two) times daily.     PROAIR HFA 108 (90 Base) MCG/ACT inhaler INHALE 1 TO 2 PUFFS BY MOUTH EVERY 4 HOURS AS NEEDED FOR WHEEZE OR SHORTNESS OF BREATH (Patient taking differently: Inhale 1-2 puffs into the lungs every 4 (four) hours as needed for wheezing or shortness of breath.) 8.5 Inhaler 11   vitamin B-12 (CYANOCOBALAMIN) 1000 MCG tablet Take 1,000 mcg by mouth daily.     Vitamin D, Ergocalciferol, (DRISDOL) 1.25 MG (50000 UNIT) CAPS capsule TAKE 1 CAPSULE BY MOUTH EVERY 7 DAYS 4 capsule 0   No current facility-administered medications for this visit.   Allergies  Allergen Reactions   Nifedipine Swelling   Codeine     Unknown per pt    Metformin And Related     N&V   Morphine And Codeine    Other Diarrhea    Shelled beans, severe IBS   Penicillins     REACTION: ITCHING AND SWELLING     Review of Systems: All systems reviewed and negative except where noted in HPI.    No results found.  Physical  Exam: BP 118/62   Pulse 65   Ht 5\' 4"  (1.626 m)   Wt 108 lb (49 kg)   BMI 18.54 kg/m  Constitutional: Pleasant,well-developed, frail, elderly Caucasian female in no acute distress. HEENT: Normocephalic and atraumatic. Conjunctivae are normal. No scleral icterus. Neck supple.  Cardiovascular: Normal rate, regular rhythm.  Pulmonary/chest: Effort normal and breath sounds normal. No wheezing, rales or rhonchi. Abdominal: Soft, nondistended, nontender. Bowel sounds active throughout. There are no masses palpable. No hepatomegaly. Extremities: no edema Neurological: Alert and oriented to person place and time. Skin: Skin  is warm and dry. No rashes noted. Psychiatric: Normal mood and affect. Behavior is normal.  CBC    Component Value Date/Time   WBC 9.1 02/25/2023 1405   RBC 3.65 (L) 02/25/2023 1405   HGB 11.8 (L) 02/25/2023 1405   HCT 36.3 02/25/2023 1405   PLT 231.0 02/25/2023 1405   MCV 99.4 02/25/2023 1405   MCH 31.0 01/12/2021 0121   MCHC 32.5 02/25/2023 1405   RDW 13.4 02/25/2023 1405   LYMPHSABS 2.4 02/25/2023 1405   MONOABS 0.8 02/25/2023 1405   EOSABS 0.2 02/25/2023 1405   BASOSABS 0.0 02/25/2023 1405    CMP     Component Value Date/Time   NA 138 02/25/2023 1405   K 4.2 02/25/2023 1405   CL 102 02/25/2023 1405   CO2 29 02/25/2023 1405   GLUCOSE 96 02/25/2023 1405   BUN 16 02/25/2023 1405   CREATININE 0.76 02/25/2023 1405   CALCIUM 10.1 02/25/2023 1405   PROT 7.3 09/03/2022 0944   ALBUMIN 4.4 09/03/2022 0944   AST 18 09/03/2022 0944   ALT 10 09/03/2022 0944   ALKPHOS 57 09/03/2022 0944   BILITOT 0.6 09/03/2022 0944   GFRNONAA >60 01/12/2021 0121   GFRAA >60 04/06/2019 1535       Latest Ref Rng & Units 02/25/2023    2:05 PM 09/03/2022    9:44 AM 02/27/2021    2:28 PM  CBC EXTENDED  WBC 4.0 - 10.5 K/uL 9.1  8.3  10.6   RBC 3.87 - 5.11 Mil/uL 3.65  3.94  3.96   Hemoglobin 12.0 - 15.0 g/dL 16.1  09.6  04.5   HCT 36.0 - 46.0 % 36.3  38.4  37.1   Platelets  150.0 - 400.0 K/uL 231.0  241.0  371.0   NEUT# 1.4 - 7.7 K/uL 5.7  4.7  7.1   Lymph# 0.7 - 4.0 K/uL 2.4  2.4  2.3       ASSESSMENT AND PLAN:    Unintentional Weight Loss Chronic unintentional weight loss over the past 2 years, with a decrease from 121 lbs in April to 108 lbs currently. No evidence of malignancy on recent CT scan. Weight loss likely multifactorial in etiology including advanced age and recent bereavement. Discussed low likelihood of identifying an underlying cause of her weight loss with further invasive testing such as endoscopy. Potential benefits of increasing caloric intake through nutritional supplements and appetite stimulant were explained. Common side effect of the appetite stimulant is nightmares, but it is generally well-tolerated and can aid in weight gain and mood improvement. - Increase Boost supplement intake to three times daily - Prescribe Mirtazepine to aid weight gain and mood improvement  Fecal incontinence Intermittent symptoms with occasional episodes of incontinence, particularly in public settings. Currently managed with Imodium as needed. Discussed use of fiber supplements to bulk up stool and reduce incontinence episodes. - Continue Imodium as needed for incontinence - Continue fiber supplement (Metamucil) to bulk up stool and reduce incontinence episodes - Hold off on bile acid binder given patient reports of mostly formed stools.  Grief and Potential Depression Recent bereavement following the death of spouse of 68 years. Significant emotional impact potentially contributing to weight loss. Discussed potential benefits and risks of antidepressant medication for mood and appetite. Common side effect is nightmares, but it is generally well-tolerated and can aid in mood improvement and weight gain. - Start Mirtazepine as above. - Monitor for side effects such as nightmares and adjust treatment as necessary  General Health Maintenance  Maintains an  active lifestyle, attending church twice a week and engaging in social activities. No current use of alcohol or tobacco. Discussed importance of maintaining nutritional intake and monitoring weight. - Encourage continued social engagement and physical activity - Monitor weight regularly and report significant changes  Follow-up - Schedule follow-up appointment to assess weight and response to appetite stimulant - Contact office if experiencing significant side effects or concerns with medication.      Michoel Kunin E. Tomasa Rand, MD [facility]   Etta Grandchild, MD

## 2023-06-10 NOTE — Patient Instructions (Signed)
We have sent the following medications to your pharmacy for you to pick up at your convenience: Mirataxepine 15 mg   Please purchase Metamucil over the counter. Take as directed.   Imodium as needed.  _______________________________________________________  If your blood pressure at your visit was 140/90 or greater, please contact your primary care physician to follow up on this.  _______________________________________________________  If you are age 87 or older, your body mass index should be between 23-30. Your Body mass index is 18.54 kg/m. If this is out of the aforementioned range listed, please consider follow up with your Primary Care Provider.  If you are age 46 or younger, your body mass index should be between 19-25. Your Body mass index is 18.54 kg/m. If this is out of the aformentioned range listed, please consider follow up with your Primary Care Provider.   ________________________________________________________  The  GI providers would like to encourage you to use [facility] to communicate with providers for non-urgent requests or questions.  Due to long hold times on the telephone, sending your provider a message by [facility] may be a faster and more efficient way to get a response.  Please allow 48 business hours for a response.  Please remember that this is for non-urgent requests.   It was a pleasure to see you today!  Thank you for trusting me with your gastrointestinal care!    Scott E.Tomasa Rand, MD

## 2023-06-10 NOTE — Telephone Encounter (Signed)
Pt seen in office today will send to [facility]

## 2023-06-10 NOTE — Telephone Encounter (Signed)
Inbound call from patient's daughter requesting a call to discuss if patient is able to be referred to dietitian. Patient's daughter is requesting to know if there is any foods or supplements that are reccommended for patient to eat or take to gain weight. Requesting a call back to discuss further. Please advise, thank you.

## 2023-06-11 ENCOUNTER — Ambulatory Visit (INDEPENDENT_AMBULATORY_CARE_PROVIDER_SITE_OTHER): Payer: Medicare Other | Admitting: Obstetrics and Gynecology

## 2023-06-11 ENCOUNTER — Encounter: Payer: Self-pay | Admitting: Obstetrics and Gynecology

## 2023-06-11 VITALS — BP 119/71 | HR 102 | Ht 60.0 in | Wt 106.0 lb

## 2023-06-11 DIAGNOSIS — Z87440 Personal history of urinary (tract) infections: Secondary | ICD-10-CM | POA: Diagnosis not present

## 2023-06-11 DIAGNOSIS — N393 Stress incontinence (female) (male): Secondary | ICD-10-CM | POA: Diagnosis not present

## 2023-06-11 DIAGNOSIS — N812 Incomplete uterovaginal prolapse: Secondary | ICD-10-CM | POA: Diagnosis not present

## 2023-06-11 DIAGNOSIS — N3281 Overactive bladder: Secondary | ICD-10-CM | POA: Insufficient documentation

## 2023-06-11 DIAGNOSIS — N390 Urinary tract infection, site not specified: Secondary | ICD-10-CM

## 2023-06-11 MED ORDER — ESTRADIOL 0.1 MG/GM VA CREA: TOPICAL_CREAM | VAGINAL | 11 refills | Status: DC

## 2023-06-11 MED ORDER — TROSPIUM CHLORIDE 20 MG PO TABS: 20.0000 mg | ORAL_TABLET | Freq: Two times a day (BID) | ORAL | 11 refills | Status: DC

## 2023-06-11 MED ORDER — ESTRADIOL 7.5 MCG/24HR VA RING: 1.0000 | VAGINAL_RING | VAGINAL | 3 refills | Status: DC

## 2023-06-11 NOTE — Assessment & Plan Note (Addendum)
-   Rare, will monitor

## 2023-06-11 NOTE — Progress Notes (Signed)
New Patient Evaluation and Consultation  Referring Provider: Mitchel Honour, DO PCP: Etta Grandchild, MD Date of Service: 06/11/2023  SUBJECTIVE Chief Complaint: New Patient (Initial Visit) Mcclennon Frush is a 89 y.o. female here for a consult for prolapse and recurrent UTI. Pt said she has pessary)  History of Present Illness: Aleksi Dewar is a 89 y.o. White or Caucasian female seen in consultation at the request of Dr Langston Masker for evaluation of prolapse and recurrent UTI.    Review of records significant for: Pt has pessary for several years  Urinary Symptoms: Leaks urine with cough/ sneeze, laughing, exercise, lifting, going from sitting to standing, with a full bladder, and with movement to the bathroom. Rare leakage with cough/ sneeze.  Wears 3-4 diapers per day, unsure how often she leaks but reports she leaks when she gets an urge. Patient is bothered by UI symptoms.  Day time voids every few hours.  Nocturia: 0 times per night to void. Voiding dysfunction:  empties bladder well.  Patient does not use a catheter to empty bladder.  When urinating, patient feels the need to urinate multiple times in a row Drinks: 1 cup coffee in AM, water, small amount of orange juice, half can coke per day  UTIs: 1- 2 UTI's in the last year.  Has been on daily macrobid for 3 years. Last urine culture positive 08/27/22- 10-49,000 gram positive cocci.  Denies history of blood in urine and kidney or bladder stones   Pelvic Organ Prolapse Symptoms:                  Patient Admits to a feeling of a bulge the vaginal area.  Patient Admits to seeing a bulge.  This bulge is bothersome. Has a pessary for about 8 years. Denies vaginal bleeding.  Not using any estrogen cream- was given it before but had a hard time using it.   Bowel Symptom: Bowel movements: 1 time(s) per day Stool consistency: soft  Straining: no.  Splinting: no.  Incomplete evacuation: no.  Patient Admits to accidental bowel leakage  / fecal incontinence  Occurs: with IBS flares  Consistency with leakage: loose, depends on IBS Bowel regimen: fiber  HM Colonoscopy   This patient has no relevant Health Maintenance data.     Sexual Function Sexually active: no.    Pelvic Pain Denies pelvic pain   Past Medical History:  Past Medical History:  Diagnosis Date   Allergic rhinitis, cause unspecified    Anxiety state, unspecified    Asthma    Coronary atherosclerosis of unspecified type of vessel, native or graft    Depression    Female bladder prolapse    Hyperlipidemia    IBS (irritable bowel syndrome)    Osteoarthrosis, unspecified whether generalized or localized, unspecified site    Type II or unspecified type diabetes mellitus without mention of complication, not stated as uncontrolled    Unspecified chronic bronchitis (HCC)    Unspecified essential hypertension    Unspecified hearing loss    Unspecified venous (peripheral) insufficiency    Unspecified vitamin D deficiency      Past Surgical History:   Past Surgical History:  Procedure Laterality Date   Cataract surg  2009   CHOLECYSTECTOMY  1997   DILATION AND CURETTAGE OF UTERUS  1960's   Left elbow surgery  1992     Past OB/GYN History: OB History  Gravida Para Term Preterm AB Living  6 4 3 1 2 4   SAB IAB Ectopic  Multiple Live Births  2    4    # Outcome Date GA Lbr Len/2nd Weight Sex Type Anes PTL Lv  6 SAB           5 SAB           4 Term     F Vag-Spont  N LIV  3 Term     F Vag-Spont  N LIV  2 Term     M Vag-Spont  N LIV     Birth Comments: DECEASED  1 Preterm     F Vag-Spont  Y LIV   Any history of abnormal pap smears: no. No results found for: "DIAGPAP", "HPVHIGH", "ADEQPAP"  Medications: Patient has a current medication list which includes the following prescription(s): acetaminophen, atorvastatin, breo ellipta, cetirizine, dicyclomine, donepezil, [START ON 06/14/2023] estradiol, estradiol, fluticasone, folic acid,  losartan, methenamine, mirtazapine, nitrofurantoin (macrocrystal-monohydrate), proair hfa, trospium, cyanocobalamin, and vitamin d (ergocalciferol).   Allergies: Patient is allergic to nifedipine, codeine, metformin and related, morphine and codeine, other, and penicillins.   Social History:  Social History   Tobacco Use   Smoking status: Never   Smokeless tobacco: Never  Vaping Use   Vaping status: Never Used  Substance Use Topics   Alcohol use: No    Alcohol/week: 0.0 standard drinks of alcohol   Drug use: No    Relationship status: widowed Patient lives with daughter.   Patient is not employed. Regular exercise: No History of abuse: No  Family History:   Family History  Problem Relation Age of Onset   Emphysema Mother    Heart disease Father    Colon cancer Sister    Colon cancer Other    Liver disease Other    Esophageal cancer Other      Review of Systems: Review of Systems  Constitutional:  Positive for malaise/fatigue. Negative for fever and weight loss.  Respiratory:  Negative for cough, shortness of breath and wheezing.   Cardiovascular:  Negative for chest pain, palpitations and leg swelling.  Gastrointestinal:  Negative for abdominal pain and blood in stool.  Genitourinary:  Negative for dysuria.  Musculoskeletal:  Negative for myalgias.  Skin:  Negative for rash.  Neurological:  Negative for dizziness and headaches.  Endo/Heme/Allergies:  Does not bruise/bleed easily.  Psychiatric/Behavioral:  Positive for depression. The patient is nervous/anxious.      OBJECTIVE Physical Exam: Vitals:   06/11/23 1319  BP: 119/71  Pulse: (!) 102  Weight: 106 lb (48.1 kg)  Height: 5' (1.524 m)    Physical Exam Vitals reviewed. Exam conducted with a chaperone present.  Constitutional:      General: She is not in acute distress. Pulmonary:     Effort: Pulmonary effort is normal.  Abdominal:     General: There is no distension.     Palpations: Abdomen is  soft.     Tenderness: There is no abdominal tenderness. There is no rebound.  Musculoskeletal:        General: No swelling. Normal range of motion.  Skin:    General: Skin is warm and dry.     Findings: No rash.  Neurological:     Mental Status: She is alert and oriented to person, place, and time.  Psychiatric:        Mood and Affect: Mood normal.        Behavior: Behavior normal.      GU / Detailed Urogynecologic Evaluation:  Pelvic Exam: Normal external female genitalia; Bartholin's and Skene's  glands normal in appearance; urethral meatus normal in appearance, no urethral masses or discharge.   CST: negative  RWS Pessary removed and cleaned Speculum exam reveals normal vaginal mucosa with atrophy. Small abrasion on the posterior apical vagina with bleeding, treated with silver nitrate. Cervix normal appearance. Uterus normal single, nontender. Adnexa no mass, fullness, tenderness.    Pelvic floor strength I/V  Pelvic floor musculature: Right levator non-tender, Right obturator non-tender, Left levator non-tender, Left obturator non-tender  POP-Q:   POP-Q  0                                            Aa   0                                           Ba  -8                                              C   3                                            Gh  4                                            Pb  10                                            tvl   -1                                            Ap  -1                                            Bp  -8.5                                              D      Rectal Exam:  deferred  Post-Void Residual (PVR) by Bladder Scan: In order to evaluate bladder emptying, we discussed obtaining a postvoid residual and patient agreed to this procedure.  Procedure: The ultrasound unit was placed on the patient's abdomen in the suprapubic region after the patient had voided.      Laboratory Results: Lab Results   Component Value Date   BILIRUBINUR NEGATIVE 04/16/2023   PROTEINUR NEGATIVE 01/09/2021   UROBILINOGEN 0.2 04/16/2023   LEUKOCYTESUR LARGE (A) 04/16/2023    Lab Results  Component Value Date   CREATININE 0.76 02/25/2023   CREATININE 0.77 09/03/2022   CREATININE 0.82 06/05/2021    Lab Results  Component Value Date   HGBA1C 5.4 02/25/2023    Lab Results  Component Value Date   HGB 11.8 (L) 02/25/2023     ASSESSMENT AND PLAN Ms. Boag is a 89 y.o. with:  1. Overactive bladder   2. Recurrent urinary tract infection   3. Uterovaginal prolapse, incomplete   4. SUI (stress urinary incontinence, female)     Overactive bladder Assessment & Plan: - We discussed the symptoms of overactive bladder (OAB), which include urinary urgency, urinary frequency, nocturia, with or without urge incontinence.  While we do not know the exact etiology of OAB, several treatment options exist. We discussed management including behavioral therapy (decreasing bladder irritants, urge suppression strategies, timed voids, bladder retraining), physical therapy, medication; for refractory cases posterior tibial nerve stimulation, sacral neuromodulation, and intravesical botulinum toxin injection.  - Prescribed trospium 20mg  BID. For anticholinergic medications, we discussed the potential side effects of anticholinergics including dry eyes, dry mouth, constipation, cognitive impairment and urinary retention. - Pt unable to void for urine sample today. She feels she normally empties well.    Orders: -     Estradiol; Place 1 each vaginally every 3 (three) months.  Dispense: 1 each; Refill: 3 -     Trospium Chloride; Take 1 tablet (20 mg total) by mouth 2 (two) times daily.  Dispense: 60 tablet; Refill: 11  Recurrent urinary tract infection Assessment & Plan: - Has only had one UTI in the last year.  - For treatment of recurrent urinary tract infections, we discussed management of recurrent UTIs including  prophylaxis with a daily low dose antibiotic, transvaginal estrogen therapy, D-mannose, and cranberry supplements.   - Advised to stop nitrofurantoin.  - Restart vaginal estrogen. She prefers the estring but it is not covered as first line. She will attempt to use vaginal estrogen twice a week.    Orders: -     Estradiol; Place 0.5g (fingertip amount) two times a week at the vaginal opening  Dispense: 42.5 g; Refill: 11  Uterovaginal prolapse, incomplete Assessment & Plan: - She wants to continue with the RWS pessary. Will plan to clean q 3 months.  - We discussed the option of surgery but she is not interested at this time.    SUI (stress urinary incontinence, female) Assessment & Plan: - Rare, will monitor   Return 6 weeks   Marguerita Beards, MD

## 2023-06-11 NOTE — Assessment & Plan Note (Signed)
-   Has only had one UTI in the last year.  - For treatment of recurrent urinary tract infections, we discussed management of recurrent UTIs including prophylaxis with a daily low dose antibiotic, transvaginal estrogen therapy, D-mannose, and cranberry supplements.   - Advised to stop nitrofurantoin.  - Restart vaginal estrogen. She prefers the estring but it is not covered as first line. She will attempt to use vaginal estrogen twice a week.

## 2023-06-11 NOTE — Assessment & Plan Note (Addendum)
-   We discussed the symptoms of overactive bladder (OAB), which include urinary urgency, urinary frequency, nocturia, with or without urge incontinence.  While we do not know the exact etiology of OAB, several treatment options exist. We discussed management including behavioral therapy (decreasing bladder irritants, urge suppression strategies, timed voids, bladder retraining), physical therapy, medication; for refractory cases posterior tibial nerve stimulation, sacral neuromodulation, and intravesical botulinum toxin injection.  - Prescribed trospium 20mg  BID. For anticholinergic medications, we discussed the potential side effects of anticholinergics including dry eyes, dry mouth, constipation, cognitive impairment and urinary retention. - Pt unable to void for urine sample today. She feels she normally empties well.

## 2023-06-11 NOTE — Assessment & Plan Note (Signed)
-   She wants to continue with the RWS pessary. Will plan to clean q 3 months.  - We discussed the option of surgery but she is not interested at this time.

## 2023-06-14 ENCOUNTER — Other Ambulatory Visit: Payer: Self-pay

## 2023-06-14 DIAGNOSIS — R634 Abnormal weight loss: Secondary | ICD-10-CM

## 2023-06-14 NOTE — Telephone Encounter (Signed)
Contacted patient's daughter and informed her that a referral has been sent to nutrition. She stated that patient has started that boost and metamucil. Patient had no questions at the end of the call.

## 2023-06-18 ENCOUNTER — Telehealth: Payer: Self-pay

## 2023-06-18 DIAGNOSIS — I10 Essential (primary) hypertension: Secondary | ICD-10-CM

## 2023-06-18 NOTE — Patient Instructions (Signed)
Visit Information  Thank you for taking time to visit with me today. Please don't hesitate to contact me if I can be of assistance to you.   Following are the goals we discussed today:  Continue to take medications as prescribed. Continue to attend provider visits as scheduled Continue to eat healthy, lean meats, vegetables, fruits, avoid saturated and transfats Contact provider with health questions or concerns as needed  Our next appointment is by telephone on 08/10/23 at 11:00 am  Please call the care guide team at [phone] if you need to cancel or reschedule your appointment.   If you are experiencing a Mental Health or Behavioral Health Crisis or need someone to talk to, please call the Suicide and Crisis Lifeline: 988 call the [place] National Suicide Prevention Lifeline: 601-842-4891 or TTY: 570-748-0568 TTY 734-042-7936) to talk to a trained counselor  Kathyrn Sheriff, RN, MSN, BSN, CCM [place]  [facility], Population Health Case Manager Phone: [phone]

## 2023-06-18 NOTE — Patient Outreach (Signed)
  Care Coordination   Initial Visit Note   06/18/2023 Name: Nichole Silva MRN: [id number] DOB: [birth year]  Nichole Silva is a 89 y.o. year old female who sees Nichole Grandchild, MD for primary care. I spoke with  Nichole Silva by phone today.  What matters to the patients health and wellness today?  RNCM called to assess for care management needs. [name] is not available. RNCM spoke with [name](HPOA) assist with managing health and appointments. Nichole Silva states patient is hearing impaired, but reads lips. She also states patient's husband passed last year. Per Nichole Silva, patient is doing well in terms of mobility status. Office visit on 06/10/23 with Nichole Silva(Gastroenterology) and Nichole Silva) on 06/11/23. Daughter reports unexplained weight loss in patient, but has gained about 4 pounds since she has be taken off antibiotic. Daughter also reports patient has increased boost to three times/day. Daughter reports she and her sister who lives with patient have been helping patient manage her heath. Per daughter, patient is oriented and very participatory in her health care, adding patient decided she would start a food diary in order to aid in managing her weight. She reports patient still needs mammogram; bone density is scheduled. Daughter states she will follow up with provider to see if patient needs to follow up with cardiologist(per review of chart, last office visit 2017). She states patient has silver sneakers and is planning to start participating in activities a the [employer]. A1C 5.4 on 02/25/23. Patient is  going to pick up new medications prescribed by urogyn on 06/24/22. Daughter-She is not sure if patient medication list is updated or accurate as other providers have prescribed medications also. She is receptive for clinical pharmacy referral for medication reconciliation.   Goals Addressed             This Visit's Progress    Assist with Health Management        Interventions Today    Flowsheet Row Most Recent Value  Chronic Disease   Chronic disease during today's visit Diabetes, Other, Hypertension (HTN)  General Interventions   General Interventions Discussed/Reviewed General Interventions Discussed, Doctor Visits  [Evaluation of current treatment plan for health condition and patient's adherence to plan.]  Doctor Visits Discussed/Reviewed PCP, Doctor Visits Discussed, Specialist  PCP/Specialist Visits Compliance with follow-up visit  [reviewed upcoming scheduled appointments]  Education Interventions   Education Provided --  [advised to take medications as prescribed, attend provider visits as scheduled]  Nutrition Interventions   Nutrition Discussed/Reviewed Nutrition Discussed, Supplemental nutrition  Pharmacy Interventions   Pharmacy Dicussed/Reviewed Pharmacy Topics Discussed, Referral to Pharmacist  [medication reconciliation]  Safety Interventions   Safety Discussed/Reviewed Safety Discussed    SDOH assessments and interventions completed:  Yes SDOH Interventions Today    Flowsheet Row Most Recent Value  SDOH Interventions   Food Insecurity Interventions Intervention Not Indicated  Housing Interventions Intervention Not Indicated  Transportation Interventions Intervention Not Indicated  Utilities Interventions Intervention Not Indicated     Care Coordination Interventions:  Yes, provided   Follow up plan: Follow up call scheduled for 08/10/23    Encounter Outcome:  Patient Visit Completed   Nichole Sheriff, RN, MSN, BSN, CCM [place]  [facility], Population Health Case Manager Phone: [phone]

## 2023-06-21 ENCOUNTER — Other Ambulatory Visit (INDEPENDENT_AMBULATORY_CARE_PROVIDER_SITE_OTHER): Payer: Medicare Other

## 2023-06-21 DIAGNOSIS — R634 Abnormal weight loss: Secondary | ICD-10-CM | POA: Diagnosis not present

## 2023-06-21 LAB — IBC + FERRITIN
Ferritin: 108.1 ng/mL (ref 10.0–291.0)
Iron: 58 ug/dL (ref 42–145)
Saturation Ratios: 18.7 % — ABNORMAL LOW (ref 20.0–50.0)
TIBC: 310.8 ug/dL (ref 250.0–450.0)
Transferrin: 222 mg/dL (ref 212.0–360.0)

## 2023-06-22 ENCOUNTER — Other Ambulatory Visit: Payer: Self-pay | Admitting: Internal Medicine

## 2023-06-22 ENCOUNTER — Other Ambulatory Visit: Payer: Self-pay

## 2023-06-22 ENCOUNTER — Telehealth: Payer: Self-pay

## 2023-06-22 ENCOUNTER — Encounter: Payer: Self-pay | Admitting: Gastroenterology

## 2023-06-22 ENCOUNTER — Telehealth: Payer: Self-pay | Admitting: Internal Medicine

## 2023-06-22 DIAGNOSIS — E559 Vitamin D deficiency, unspecified: Secondary | ICD-10-CM

## 2023-06-22 DIAGNOSIS — E440 Moderate protein-calorie malnutrition: Secondary | ICD-10-CM

## 2023-06-22 LAB — VITAMIN D 25 HYDROXY (VIT D DEFICIENCY, FRACTURES): VITD: 109.52 ng/mL (ref 30.00–100.00)

## 2023-06-22 LAB — PREALBUMIN: Prealbumin: 19 mg/dL (ref 17–34)

## 2023-06-22 LAB — VITAMIN B12: Vitamin B-12: >1537 pg/mL — ABNORMAL HIGH (ref 211–911)

## 2023-06-22 NOTE — Progress Notes (Signed)
Ms. Cassara,  Your Vitamin D and B12 levels were very high.  You should stop taking vitamin D and B12 supplements now.  Plan to recheck in one month. Your iron levels were normal.  Bonita Quin,  Please place orders for Vit D/B12 level in 1 month

## 2023-06-22 NOTE — Telephone Encounter (Signed)
FYI

## 2023-06-22 NOTE — Telephone Encounter (Signed)
The lab called with a critical Vitamin D level of 109.52 for this pt

## 2023-06-22 NOTE — Telephone Encounter (Signed)
Copied from CRM [id number]. Topic: Clinical - Medical Advice >> Jun 22, 2023  2:37 PM Isabell A wrote: Reason for CRM: Samara Deist (daughter) states Dr.Cunningham, gastroenterologist states her labs came back & he discontinued the patient from taking Vitamin D & B12. OBGYN Dr.Michelle Schroeder discontinued macrobid. Samara Deist would like to let Dr.Jones know her iron is good.

## 2023-06-23 ENCOUNTER — Other Ambulatory Visit: Payer: Self-pay | Admitting: Internal Medicine

## 2023-06-23 DIAGNOSIS — Z1231 Encounter for screening mammogram for malignant neoplasm of breast: Secondary | ICD-10-CM

## 2023-06-24 ENCOUNTER — Telehealth: Payer: Self-pay

## 2023-06-24 ENCOUNTER — Encounter: Payer: Medicare Other | Attending: Gastroenterology | Admitting: Dietician

## 2023-06-24 ENCOUNTER — Encounter: Payer: Self-pay | Admitting: Dietician

## 2023-06-24 VITALS — Wt 112.3 lb

## 2023-06-24 DIAGNOSIS — E119 Type 2 diabetes mellitus without complications: Secondary | ICD-10-CM | POA: Diagnosis not present

## 2023-06-24 NOTE — Patient Outreach (Signed)
  Care Coordination   Follow Up Visit Note   06/24/2023 Name: Nichole Silva MRN: [id number] DOB: 1934-11-25  Nichole Silva is a 89 y.o. year old female who sees Etta Grandchild, MD for primary care. I spoke with [name] by phone today.  What matters to the patients health and wellness today?  RNCM returned call to patient's daughter, Nichole Silva, who reports patient is due for a density bone scan. Per Nichole Silva, patient's last bone density scan was October 2018 at [facility].  Per Nichole Silva, [facility] are doing Bone Density scans this Saturday 06/26/23. She is requesting a referral/order for the Bone Density scan for Saturday, if possible.    Goals Addressed             This Visit's Progress    Assist with Health Management       Interventions Today    Flowsheet Row Most Recent Value  Chronic Disease   Chronic disease during today's visit Other  General Interventions   General Interventions Discussed/Reviewed General Interventions Reviewed, Health Screening, Communication with  Health Screening Bone Density, Mammogram  [confirmed patient scheduled for a Mammogram on 06/30/23. requesting a referral for Bone density scan]  Communication with PCP/Specialists  [message to PCP regarding request for Bone density scan.]  Pharmacy Interventions   Pharmacy Dicussed/Reviewed Pharmacy Topics Discussed  [per review of chart patient to stop Vitamin D and B12. Daughter confirms she is aware.]            SDOH assessments and interventions completed:  No  Care Coordination Interventions:  Yes, provided   Follow up plan:  RNCM will continue to follow    Encounter Outcome:  Patient Visit Completed   Kathyrn Sheriff, RN, MSN, BSN, CCM [place]  [facility], Population Health Case Manager Phone: [phone]

## 2023-06-24 NOTE — Progress Notes (Signed)
Diabetes Self-Management Education  Visit Type: First/Initial  Appt. Start Time: 1650 Appt. End Time: 1750  06/25/2023  Nichole Silva, identified by name and date of birth, is a 89 y.o. female with a diagnosis of Diabetes: Type 2.   ASSESSMENT  History includes: anxiety, arthritis, asthma, depression, type 2 diabetes, HLD, HTN Labs noted: 02/25/23 A1c 5.4% Medications include: reviewed Supplements: vitamin D, vitamin b12, folic acid  Pt present today with her daughter. Pt daughter reports pt is HOH and forgot her hearing aids but states pt is able to read lips if you speak loudly.   Pt reports she has IBS-D which can also make her nauseated. Pt daughter states pt used to be 120-130 lb, but has lost weight over the last year and a half. She reports pt was down to 104 lb, but is now back up to 112 lb. Pt daughter states pt was on an antibiotic for 1.5 years and thinks this may have contributed to her diarrhea. Pt states her husband died in Apr 06, 2024which has been a big stressor, and she was his primary caregiver before his passing which may also have contributed to some weight loss.   Pt states she has been hoping to gain weight to around 120 lb. Pt reports she has been drinking 1-2 Boost per day to aid in calorie and protein intake. Pt reports she always has breakfast and lunch, but may not have dinner sometimes. Pt states if one of her kids cooks she will eat, but otherwise she won't be hungry in the evening or feel like cooking. Pt seems to have good support from her children.   Nutrition Focused Physical Exam for assessment of malnutrition in the following areas: Orbital region: mild-moderate Temporal region: mild-moderate Clavicle region: mild-moderate  Weight 112 lb 4.8 oz (50.9 kg). Body mass index is 21.93 kg/m.   Diabetes Self-Management Education - 06/24/23 1650       Visit Information   Visit Type First/Initial      Initial Visit   Diabetes Type Type 2    Date  Diagnosed unknown    Are you taking your medications as prescribed? Yes      Health Coping   How would you rate your overall health? Good      Psychosocial Assessment   Patient Belief/Attitude about Diabetes Motivated to manage diabetes    What is the hardest part about your diabetes right now, causing you the most concern, or is the most worrisome to you about your diabetes?   Making healty food and beverage choices    Self-care barriers None    Self-management support Doctor's office    Other persons present Patient    Patient Concerns Nutrition/Meal planning    Special Needs None    Preferred Learning Style No preference indicated    Learning Readiness Ready      Pre-Education Assessment   Patient understands the diabetes disease and treatment process. Needs Instruction    Patient understands incorporating nutritional management into lifestyle. Needs Instruction    Patient undertands incorporating physical activity into lifestyle. Needs Instruction    Patient understands using medications safely. Needs Instruction    Patient understands monitoring blood glucose, interpreting and using results Needs Instruction    Patient understands prevention, detection, and treatment of acute complications. Needs Instruction    Patient understands prevention, detection, and treatment of chronic complications. Needs Instruction    Patient understands how to develop strategies to address psychosocial issues. Needs Instruction  Patient understands how to develop strategies to promote health/change behavior. Needs Instruction      Complications   Last HgB A1C per patient/outside source 5.4 %      Dietary Intake   Breakfast oats, egg, juice, orange    Lunch chicken salad sandwich on 1 slice of bread and boost    Snack (afternoon) oatmeal cream pie    Dinner 1 c chili    Beverage(s) juice, 1-2 boost, 4-6 cups      Activity / Exercise   Activity / Exercise Type Light (walking / raking leaves)       Patient Education   Previous Diabetes Education Yes (please comment)    Disease Pathophysiology Explored patient's options for treatment of their diabetes    Healthy Eating Role of diet in the treatment of diabetes and the relationship between the three main macronutrients and blood glucose level;Plate Method;Food label reading, portion sizes and measuring food.;Meal timing in regards to the patients' current diabetes medication.;Information on hints to eating out and maintain blood glucose control.;Meal options for control of blood glucose level and chronic complications.    Being Active Identified with patient nutritional and/or medication changes necessary with exercise.    Chronic complications Relationship between chronic complications and blood glucose control;Identified and discussed with patient  current chronic complications    Diabetes Stress and Support Identified and addressed patients feelings and concerns about diabetes;Role of stress on diabetes;Worked with patient to identify barriers to care and solutions    Lifestyle and Health Coping Lifestyle issues that need to be addressed for better diabetes care      Individualized Goals (developed by patient)   Nutrition General guidelines for healthy choices and portions discussed    Physical Activity Not Applicable    Medications Not Applicable    Monitoring  Not Applicable    Problem Solving Eating Pattern    Reducing Risk examine blood glucose patterns;do foot checks daily;treat hypoglycemia with 15 grams of carbs if blood glucose less than 70mg /dL    Health Coping Ask for help with psychological, social, or emotional issues      Post-Education Assessment   Patient understands the diabetes disease and treatment process. Comprehends key points    Patient understands incorporating nutritional management into lifestyle. Comprehends key points    Patient undertands incorporating physical activity into lifestyle. Comprehends key  points    Patient understands using medications safely. Comphrehends key points    Patient understands monitoring blood glucose, interpreting and using results Comprehends key points    Patient understands prevention, detection, and treatment of acute complications. Comprehends key points    Patient understands prevention, detection, and treatment of chronic complications. Comprehends key points    Patient understands how to develop strategies to address psychosocial issues. Comprehends key points    Patient understands how to develop strategies to promote health/change behavior. Comprehends key points      Outcomes   Expected Outcomes Demonstrated interest in learning. Expect positive outcomes    Future DMSE 4-6 wks    Program Status Not Completed             Individualized Plan for Diabetes Self-Management Training:   Learning Objective:  Patient will have a greater understanding of diabetes self-management. Patient education plan is to attend individual and/or group sessions per assessed needs and concerns.   Plan:   There are no Patient Instructions on file for this visit.  Expected Outcomes:  Demonstrated interest in learning. Expect positive outcomes  Education  material provided: ADA - How to Thrive: A Guide for Your Journey with Diabetes, Nutrition Care [place] High Calorie High Protein Nutrition Therapy  If problems or questions, patient to contact team via:  Phone  Future DSME appointment: 4-6 wks

## 2023-06-24 NOTE — Patient Instructions (Signed)
Visit Information  Thank you for taking time to visit with me today. Please don't hesitate to contact me if I can be of assistance to you.   Following are the goals we discussed today:  Continue to take medications as prescribed. Continue to attend provider visits as scheduled Continue to eat healthy, lean meats, vegetables, fruits, avoid saturated and transfats Contact provider with health questions or concerns as needed  Our next appointment is by telephone on 08/10/23 at 11:00 am  Please call the care guide team at [phone] if you need to cancel or reschedule your appointment.   If you are experiencing a Mental Health or Behavioral Health Crisis or need someone to talk to, please call the Suicide and Crisis Lifeline: 988 call the [place] National Suicide Prevention Lifeline: 601-842-4891 or TTY: 570-748-0568 TTY 734-042-7936) to talk to a trained counselor  Kathyrn Sheriff, RN, MSN, BSN, CCM [place]  [facility], Population Health Case Manager Phone: [phone]

## 2023-06-28 ENCOUNTER — Telehealth: Payer: Self-pay

## 2023-06-28 NOTE — Progress Notes (Signed)
Care Guide Pharmacy Note  06/28/2023 Name: Nichole Silva MRN: [id number] DOB: 02-Oct-1934  Referred By: Etta Grandchild, MD Reason for referral: Care Coordination (Outreach to schedule with Pharm d )   Nichole Silva is a 89 y.o. year old female who is a primary care patient of Etta Grandchild, MD.  Nichole Silva was referred to the pharmacist for assistance related to: HTN  Successful contact was made with the patient to discuss pharmacy services including being ready for the pharmacist to call at least 5 minutes before the scheduled appointment time and to have medication bottles and any blood pressure readings ready for review. The patient agreed to meet with the pharmacist via telephone visit on (date/time).07/13/2023  Penne Lash , RMA     [place]  [place], [facility] Guide  Direct Dial: [phone]  Website: [email or web address]

## 2023-06-29 ENCOUNTER — Other Ambulatory Visit: Payer: Self-pay

## 2023-06-29 DIAGNOSIS — E440 Moderate protein-calorie malnutrition: Secondary | ICD-10-CM

## 2023-06-30 ENCOUNTER — Telehealth: Payer: Self-pay | Admitting: Internal Medicine

## 2023-06-30 DIAGNOSIS — Z1231 Encounter for screening mammogram for malignant neoplasm of breast: Secondary | ICD-10-CM

## 2023-06-30 NOTE — Telephone Encounter (Signed)
 Please advise bone density? Will make her daughter aware that patient can get vaccine at pharmacy.

## 2023-06-30 NOTE — Telephone Encounter (Signed)
 Copied from CRM [id number]. Topic: Clinical - Request for Lab/Test Order >> Jun 30, 2023  2:28 PM Chantha C wrote: Reason for CRM: Patient's child Lamarr calling for patient, patient want to have bone density test, please can this order. Also, patient wants to have Shingles vaccination. Please advise and call back at [phone].  FYI: Dr. Stacia toke Vitamin B12, Vitamin D  and Folic Acid . Iron level is now good.

## 2023-06-30 NOTE — Telephone Encounter (Signed)
 Order entered for folate as requested.

## 2023-07-01 ENCOUNTER — Other Ambulatory Visit: Payer: Self-pay | Admitting: Internal Medicine

## 2023-07-01 DIAGNOSIS — K589 Irritable bowel syndrome without diarrhea: Secondary | ICD-10-CM

## 2023-07-02 ENCOUNTER — Ambulatory Visit: Payer: Self-pay | Admitting: Internal Medicine

## 2023-07-02 ENCOUNTER — Telehealth: Payer: Self-pay | Admitting: Gastroenterology

## 2023-07-02 ENCOUNTER — Telehealth: Payer: Self-pay | Admitting: Internal Medicine

## 2023-07-02 NOTE — Telephone Encounter (Signed)
 Copied from CRM [id number]. Topic: Referral - Status >> Jul 02, 2023 11:11 AM Mercedes MATSU wrote: Reason for CRM: Patient is requesting update on her bone density referral. Please call patient and let her know when the referral has been placed so she can schedule her appointment. Patients daughter states this is the second referral request.

## 2023-07-02 NOTE — Telephone Encounter (Signed)
 See mychart messages

## 2023-07-02 NOTE — Telephone Encounter (Signed)
 Patient's daughter called for Bentyl  medication, patient's daughter was advised it was sent it in by her PCP and is wondering if she would need an appointment whether to continue medication.

## 2023-07-02 NOTE — Telephone Encounter (Signed)
 Copied from CRM [id number]. Topic: Clinical - Medication Question >> Jul 02, 2023 11:10 AM Mercedes MATSU wrote: Reason for CRM: Patients daughter called in on behalf of the patient. [facility] called her and said that the medication dicyclomine  (BENTYL ) 10 MG capsule for pick up. Patients daughter is confused because she thought Dr. Stacia was managing her IBS and wants to know why it was sent over. Patient is requesting a call back to [phone]. Reason for Disposition  [1] Caller requesting NON-URGENT health information AND [2] PCP's office is the best resource  Answer Assessment - Initial Assessment Questions 1. REASON FOR CALL or QUESTION: What is your reason for calling today? or How can I best help you? or What question do you have that I can help answer?     Patients daughter called in on behalf of the patient. [facility] called her and said that the medication dicyclomine  (BENTYL ) 10 MG capsule for pick up. Patients daughter is confused because she thought Dr. Stacia was managing her IBS and wants to know why it was sent over. Patient is requesting a call back to [phone].  Protocols used: Information Only Call - No Triage-A-AH

## 2023-07-05 ENCOUNTER — Telehealth: Payer: Self-pay | Admitting: Obstetrics and Gynecology

## 2023-07-05 ENCOUNTER — Other Ambulatory Visit: Payer: Self-pay | Admitting: Internal Medicine

## 2023-07-05 DIAGNOSIS — E559 Vitamin D deficiency, unspecified: Secondary | ICD-10-CM

## 2023-07-05 DIAGNOSIS — E2839 Other primary ovarian failure: Secondary | ICD-10-CM | POA: Insufficient documentation

## 2023-07-05 DIAGNOSIS — M8000XS Age-related osteoporosis with current pathological fracture, unspecified site, sequela: Secondary | ICD-10-CM

## 2023-07-05 NOTE — Telephone Encounter (Signed)
 Daughter called to inform us  that Nichole Silva has decided not to take the two medications you gave her on her last visit in January.  She wanted to come for a follow up in April for pessary check only.  I have rescheduled the appt out another month, but she wanted to make sure that there was a record that she did not elect to begin those meds.

## 2023-07-05 NOTE — Telephone Encounter (Signed)
**Note De-identified  [place] Obfuscation** Please advise 

## 2023-07-06 ENCOUNTER — Telehealth: Payer: Self-pay | Admitting: Internal Medicine

## 2023-07-06 NOTE — Telephone Encounter (Signed)
Please call patient and let her know its ok if she does not take the pill for her bladder, but I would prefer if she start the vaginal estrogen cream because this can help prevent UTIs.

## 2023-07-06 NOTE — Telephone Encounter (Signed)
Unable to reach the daughter. LMTRC

## 2023-07-06 NOTE — Telephone Encounter (Unsigned)
Copied from CRM [id number]. Topic: General - Call Back - No Documentation >> Jul 06, 2023  1:18 PM Corin V wrote: Reason for CRM: Patient's daughter returning a call to [facility] regarding a medication question. Not documented what information Jaz needs. Please call Samara Deist back when available. Please leave detailed questions on chart if have to leave voice mail. Samara Deist stated that the IBS medications patient picked up had side effects that she was not comfortable taking those medications since they were not mandatory medications. They were the estradiol cream and trospium tablets

## 2023-07-06 NOTE — Telephone Encounter (Signed)
Spoke with the patients daughter and cleared everything up about the patient medication. I gave them a verbal understanding and they gave me one also. They were just confused why Dr. Yetta Barre refilled the medication when Dr. Tomasa Rand is who took over this medication. I advised them that he refilled the medication due to [place] receiving a refill request. She stated that she will call the pharmacy and have this script ended so that way when the patient needs this medicine again Dr. Tomasa Rand will have to wrote a new script

## 2023-07-06 NOTE — Telephone Encounter (Signed)
Left message for return call.

## 2023-07-06 NOTE — Telephone Encounter (Signed)
This has been answered in another telephone call.

## 2023-07-07 ENCOUNTER — Ambulatory Visit: Payer: Medicare Other | Admitting: Internal Medicine

## 2023-07-07 ENCOUNTER — Other Ambulatory Visit: Payer: Self-pay | Admitting: Internal Medicine

## 2023-07-07 DIAGNOSIS — E559 Vitamin D deficiency, unspecified: Secondary | ICD-10-CM

## 2023-07-07 DIAGNOSIS — M8000XS Age-related osteoporosis with current pathological fracture, unspecified site, sequela: Secondary | ICD-10-CM

## 2023-07-07 DIAGNOSIS — E2839 Other primary ovarian failure: Secondary | ICD-10-CM

## 2023-07-09 ENCOUNTER — Ambulatory Visit
Admission: RE | Admit: 2023-07-09 | Discharge: 2023-07-09 | Disposition: A | Discharge status: Home / Self Care | Payer: Medicare Other | Source: Ambulatory Visit | Attending: Internal Medicine | Admitting: Internal Medicine

## 2023-07-09 ENCOUNTER — Ambulatory Visit: Payer: Medicare Other

## 2023-07-09 DIAGNOSIS — Z1231 Encounter for screening mammogram for malignant neoplasm of breast: Secondary | ICD-10-CM | POA: Diagnosis not present

## 2023-07-13 ENCOUNTER — Other Ambulatory Visit: Payer: Medicare Other

## 2023-07-13 NOTE — Telephone Encounter (Signed)
 Spoke to the patients daughter in depth about the benefits of vaginal estrogen. Nichole Silva has chose not to use any estrogen replacement. She will discuss in more depth at her pessary cleaning appointment in April.

## 2023-07-21 ENCOUNTER — Telehealth: Payer: Self-pay | Admitting: Gastroenterology

## 2023-07-21 DIAGNOSIS — R159 Full incontinence of feces: Secondary | ICD-10-CM

## 2023-07-21 NOTE — Telephone Encounter (Signed)
 Nichole Silva is calling to find out if Dr. Tomasa Rand will be sending a referral for pelvic floor therapy. Please advise.

## 2023-07-21 NOTE — Telephone Encounter (Signed)
 S daughter states her mother is still having issues with fecal incontinence while taking the metamucil. She would like a referral for pelvic floor therapy. Let her know Dr. Tomasa Rand is out of the office and we can let them know when he returns next week. Please advise.

## 2023-07-22 ENCOUNTER — Ambulatory Visit: Payer: Medicare Other | Admitting: Obstetrics and Gynecology

## 2023-07-23 ENCOUNTER — Other Ambulatory Visit: Payer: Medicare Other

## 2023-07-23 ENCOUNTER — Telehealth: Payer: Self-pay

## 2023-07-23 NOTE — Progress Notes (Signed)
 Complex Care Management Care Guide Note  07/23/2023 Name: Nichole Silva MRN: [id number] DOB: [birth year]  Nichole Silva is a 89 y.o. year old female who is a primary care patient of Etta Grandchild, MD and is actively engaged with the care management team. I reached out to Jacky Kindle by phone today to assist with re-scheduling  with the Pharmacist.  Follow up plan: Unsuccessful telephone outreach attempt made. A HIPAA compliant phone message was left for the patient providing contact information and requesting a return call.  Penne Lash , RMA     [facility]  [place], [facility] Guide  Direct Dial: [phone]  Website: [email or web address]

## 2023-07-26 NOTE — Telephone Encounter (Signed)
 Ambulatory referral to PT in epic at [facility] (Pelvic floor therapy). MyChart message sent to patient with referral information.

## 2023-07-29 ENCOUNTER — Ambulatory Visit: Payer: Medicare Other | Admitting: Dietician

## 2023-08-10 ENCOUNTER — Ambulatory Visit: Payer: Self-pay

## 2023-08-10 ENCOUNTER — Ambulatory Visit: Payer: Medicare Other | Admitting: Obstetrics and Gynecology

## 2023-08-10 DIAGNOSIS — E118 Type 2 diabetes mellitus with unspecified complications: Secondary | ICD-10-CM

## 2023-08-10 NOTE — Patient Outreach (Signed)
 Care Coordination   Follow Up Visit Note   08/10/2023 Name: Nichole Silva MRN: [id number] DOB: [birth year]  Nichole Silva is a 89 y.o. year old female who sees Etta Grandchild, MD for primary care. I spoke with daughter, [name](daughter/dpr) by phone today.  What matters to the patients health and wellness today?  Per Daughter, Nichole Silva is doing well. She reports her sister Corrie Dandy assist patient with managing medication. She denies any medication questions at this time. Daughter reports patient could benefit from food stamps if patient is eligible.   Goals Addressed             This Visit's Progress    Assist with Health Management       Interventions Today    Flowsheet Row Most Recent Value  Chronic Disease   Chronic disease during today's visit Diabetes, Hypertension (HTN), Other  [IBS]  General Interventions   General Interventions Discussed/Reviewed General Interventions Reviewed, Doctor Visits, Vaccines  [Evaluation of current treatment plan for health condition and patient's adherence to plan.]  Vaccines Shingles  [discussed looking into local pharmacy or [place] pharmacy to see if they administer shingles vaccine. reinforced paitent will need a prescription from her provider.]  Doctor Visits Discussed/Reviewed Doctor Visits Reviewed, PCP, Specialist  PCP/Specialist Visits Compliance with follow-up visit  [reviewed upcoming/scheduled appointments including appointment with nutrition/diabetes educator tomorrow]  Education Interventions   Education Provided Provided Education  Provided Verbal Education On When to see the doctor, Medication, Other  [advised to continue to take medications as prescribed, attend provider visits as scheduled, eat healthy, contact provider with health questions or concerns as needed]  Nutrition Interventions   Nutrition Discussed/Reviewed Nutrition Discussed  [continue to work with nutrition diabetes educator as scheduled]  Pharmacy  Interventions   Pharmacy Dicussed/Reviewed Pharmacy Topics Reviewed              SDOH assessments and interventions completed:  Yes  SDOH Interventions Today    Flowsheet Row Most Recent Value  SDOH Interventions   Food Insecurity Interventions Other (Comment)  [referral to care guide]     Care Coordination Interventions:  Yes, provided   Follow up plan: Follow up call scheduled for 09/01/23    Encounter Outcome:  Patient Visit Completed   Kathyrn Sheriff, RN, MSN, BSN, CCM [place]  [facility], Population Health Case Manager Phone: [phone]

## 2023-08-10 NOTE — Patient Instructions (Signed)
 Visit Information  Thank you for taking time to visit with me today. Please don't hesitate to contact me if I can be of assistance to you.   Following are the goals we discussed today:  Continue to take medications as prescribed. Continue to attend provider visits as scheduled Continue to eat healthy, lean meats, vegetables, fruits, avoid saturated and transfats Contact provider with health questions or concerns as needed  Our next appointment is by telephone on 09/01/23 at 11:30 am  Please call the care guide team at [phone] if you need to cancel or reschedule your appointment.   If you are experiencing a Mental Health or Behavioral Health Crisis or need someone to talk to, please call the Suicide and Crisis Lifeline: 988 call the [place] National Suicide Prevention Lifeline: (367)414-3846 or TTY: 734-835-7113 TTY (412)453-0617) to talk to a trained counselor  Kathyrn Sheriff, RN, MSN, BSN, CCM [place]  [facility], Population Health Case Manager Phone: [phone]

## 2023-08-11 ENCOUNTER — Encounter: Payer: Self-pay | Admitting: Dietician

## 2023-08-11 ENCOUNTER — Other Ambulatory Visit (INDEPENDENT_AMBULATORY_CARE_PROVIDER_SITE_OTHER)

## 2023-08-11 ENCOUNTER — Encounter: Attending: Gastroenterology | Admitting: Dietician

## 2023-08-11 VITALS — Ht 62.0 in | Wt 111.0 lb

## 2023-08-11 DIAGNOSIS — E119 Type 2 diabetes mellitus without complications: Secondary | ICD-10-CM | POA: Insufficient documentation

## 2023-08-11 DIAGNOSIS — E559 Vitamin D deficiency, unspecified: Secondary | ICD-10-CM

## 2023-08-11 DIAGNOSIS — E440 Moderate protein-calorie malnutrition: Secondary | ICD-10-CM | POA: Diagnosis not present

## 2023-08-11 LAB — VITAMIN B12: Vitamin B-12: >2000 pg/mL — ABNORMAL HIGH (ref 200–1100)

## 2023-08-11 NOTE — Progress Notes (Unsigned)
 Diabetes Self-Management Education  Visit Type: Follow-up  Appt. Start Time: 1730 Appt. End Time: 1800  08/12/2023  Nichole Silva, identified by name and date of birth, is a 89 y.o. female with a diagnosis of Diabetes: Type 2.   ASSESSMENT  History includes: anxiety, arthritis, asthma, depression, type 2 diabetes, HLD, HTN Labs noted: 02/25/23 A1c 5.4% Medications include: reviewed Supplements: vitamin D, folic acid   Pt present today with her daughter. Pt states she thinks she has gained weight because she feels like her clothes are fitting a little more snug. Pt states she feels like her energy is good and hasn't had issues with energy levels. Pt reports she would still like to ideally gain to around 115-120 lb. Pt reports when she was 89 years old she was 120 lb. Pt states she was 114 lb at the beginning of the week but IBS has been bad this week with more diarrhea which is why she thinks she is 111 lb today.   Pt reports she has increased from 1-2 boost daily to at least 2 daily, and occasionally will drink 3. Pt reports she has not noticed any correlation with boost intake and diarrhea and states she has dealt with IBS throughout her life. Pt daughter states most people in their family have GI issues.   Pt daughter states that [employer] will be helping pt with 'aging in place' and have plans to fix her bathroom and roof. Daughter reports they are also planning on working on de-cluttering which she thinks will help with pt's stress.  Height 5\' 2"  (1.575 m), weight 111 lb (50.3 kg). Body mass index is 20.3 kg/m.   Diabetes Self-Management Education - 08/11/23 1729       Visit Information   Visit Type Follow-up      Initial Visit   Diabetes Type Type 2    Are you taking your medications as prescribed? Yes      Health Coping   How would you rate your overall health? Good      Psychosocial Assessment   Patient Belief/Attitude about Diabetes Motivated to  manage diabetes    What is the hardest part about your diabetes right now, causing you the most concern, or is the most worrisome to you about your diabetes?   Making healty food and beverage choices    Self-care barriers None    Self-management support Doctor's office    Other persons present Patient    Patient Concerns Nutrition/Meal planning    Special Needs None    Preferred Learning Style No preference indicated    Learning Readiness Ready      Pre-Education Assessment   Patient understands the diabetes disease and treatment process. Needs Review    Patient understands incorporating nutritional management into lifestyle. Needs Review    Patient undertands incorporating physical activity into lifestyle. Needs Review    Patient understands using medications safely. Needs Review    Patient understands monitoring blood glucose, interpreting and using results Needs Review    Patient understands prevention, detection, and treatment of acute complications. Needs Review    Patient understands prevention, detection, and treatment of chronic complications. Needs Review    Patient understands how to develop strategies to address psychosocial issues. Needs Review    Patient understands how to develop strategies to promote health/change behavior. Needs Review      Complications   Last HgB A1C per patient/outside source 5.4 %      Dietary Intake  Breakfast oatmeal and an Location manager egg muffin and juice    Snack (morning) orange    Lunch 1 c broccoli cheese soup and boost    Snack (afternoon) boost shake and crackers or oatmeal cream pie    Dinner meatloaf and vegetables    Snack (evening) boost or none    Beverage(s) 2-3 boosts daily, water, small amount of coke or juice      Activity / Exercise   Activity / Exercise Type ADL's      Patient Education   Previous Diabetes Education Yes (please comment)    Disease Pathophysiology Explored patient's options for treatment of their diabetes     Healthy Eating Role of diet in the treatment of diabetes and the relationship between the three main macronutrients and blood glucose level;Plate Method;Meal timing in regards to the patients' current diabetes medication.;Meal options for control of blood glucose level and chronic complications.;Information on hints to eating out and maintain blood glucose control.    Medications Reviewed patients medication for diabetes, action, purpose, timing of dose and side effects.    Chronic complications Relationship between chronic complications and blood glucose control;Identified and discussed with patient  current chronic complications    Diabetes Stress and Support Identified and addressed patients feelings and concerns about diabetes;Role of stress on diabetes;Worked with patient to identify barriers to care and solutions    Lifestyle and Health Coping Lifestyle issues that need to be addressed for better diabetes care      Individualized Goals (developed by patient)   Nutrition General guidelines for healthy choices and portions discussed    Physical Activity Not Applicable    Medications Not Applicable    Monitoring  Test my blood glucose as discussed    Problem Solving Eating Pattern    Reducing Risk examine blood glucose patterns;do foot checks daily;treat hypoglycemia with 15 grams of carbs if blood glucose less than 70mg /dL    Health Coping Ask for help with psychological, social, or emotional issues      Patient Self-Evaluation of Goals - Patient rates self as meeting previously set goals (% of time)   Nutrition >75% (most of the time)    Physical Activity Not Applicable    Medications Not Applicable    Monitoring 50 - 75 % (half of the time)    Problem Solving and behavior change strategies  50 - 75 % (half of the time)    Reducing Risk (treating acute and chronic complications) 50 - 75 % (half of the time)    Health Coping 50 - 75 % (half of the time)      Post-Education Assessment    Patient understands the diabetes disease and treatment process. Comprehends key points    Patient understands incorporating nutritional management into lifestyle. Comprehends key points    Patient undertands incorporating physical activity into lifestyle. Comprehends key points    Patient understands using medications safely. Comphrehends key points    Patient understands monitoring blood glucose, interpreting and using results Comprehends key points    Patient understands prevention, detection, and treatment of acute complications. Comprehends key points    Patient understands prevention, detection, and treatment of chronic complications. Comprehends key points    Patient understands how to develop strategies to address psychosocial issues. Comprehends key points    Patient understands how to develop strategies to promote health/change behavior. Comprehends key points      Outcomes   Expected Outcomes Demonstrated interest in learning. Expect positive outcomes  Future DMSE PRN    Program Status Completed             Individualized Plan for Diabetes Self-Management Training:   Learning Objective:  Patient will have a greater understanding of diabetes self-management. Patient education plan is to attend individual and/or group sessions per assessed needs and concerns.   Plan:   Patient Instructions  Continue aiming for 3 meals daily with a small snack between.  Aim to follow scheduled meal and snack times. -Breakfast -Snack (spaced 2 hours between) -Lunch -Snack (spaced 2 hours between) -Dinner  [facility]  Continue with at least 2 boost daily.   Continue to monitor GI symptoms to determine what foods cause worse symptoms.   Expected Outcomes:  Demonstrated interest in learning. Expect positive outcomes  Education material provided: no handouts on this follow up  If problems or questions, patient to contact team via:  Phone  Future DSME appointment: PRN

## 2023-08-12 LAB — VITAMIN D 25 HYDROXY (VIT D DEFICIENCY, FRACTURES): VITD: 77.53 ng/mL (ref 30.00–100.00)

## 2023-08-12 LAB — FOLATE: Folate: >25.2 ng/mL (ref >5.9)

## 2023-08-12 NOTE — Patient Instructions (Signed)
 Continue aiming for 3 meals daily with a small snack between.  Aim to follow scheduled meal and snack times. -Breakfast -Snack (spaced 2 hours between) -Lunch -Snack (spaced 2 hours between) -Dinner  [facility]  Continue with at least 2 boost daily.   Continue to monitor GI symptoms to determine what foods cause worse symptoms.

## 2023-08-13 ENCOUNTER — Telehealth: Payer: Self-pay | Admitting: *Deleted

## 2023-08-13 NOTE — Progress Notes (Signed)
 Complex Care Management Note Care Guide Note  08/13/2023 Name: Nichole Silva MRN: [id number] DOB: 03-29-1935  [name] is a 89 y.o. year old female who is a primary care patient of Etta Grandchild, MD . The community resource team was consulted for assistance with Food Insecurity  SDOH screenings and interventions completed:  Yes     SDOH Interventions Today    Flowsheet Row Most Recent Value  SDOH Interventions   Food Insecurity Interventions Community Resources Provided  [Offered food banks patient can apply for food stamps but is unlikely to qualify but provided the information .]        Care guide performed the following interventions: Patient provided with information about care guide support team and interviewed to confirm resource needs.Also provided community resources .  Follow Up Plan:  No further follow up planned at this time. The patient has been provided with needed resources.  Encounter Outcome:  Patient Visit Completed  Dione Booze  [facility] Guide  Direct Dial:[phone] [phone] Website: [email or web address]

## 2023-08-16 NOTE — Progress Notes (Deleted)
 Cardiology Office Note:    Date:  08/16/2023   ID:  Nichole Silva, DOB 03-May-1935, MRN [id number]  PCP:  Nichole Grandchild, MD   [facility] Providers Cardiologist:  None { Click to update primary MD,subspecialty MD or APP then REFRESH:1}    Referring MD: Nichole Grandchild, MD   No chief complaint on file. ***  History of Present Illness:    Nichole Silva is a 89 y.o. female is seen at the request of Nichole Silva to reestablish cardiac care. Previously seen by Nichole Nichole Silva- last in 2017. She has a history of DM, HTN, HLD. Family history of CAD. Had remote cath at age 14 showing no CAD. Subsequently had negative ETT x 2.   Past Medical History:  Diagnosis Date   Allergic rhinitis, cause unspecified    Anxiety state, unspecified    Asthma    Coronary atherosclerosis of unspecified type of vessel, native or graft    Depression    Female bladder prolapse    Hyperlipidemia    IBS (irritable bowel syndrome)    Osteoarthrosis, unspecified whether generalized or localized, unspecified site    Type II or unspecified type diabetes mellitus without mention of complication, not stated as uncontrolled    Unspecified chronic bronchitis (HCC)    Unspecified essential hypertension    Unspecified hearing loss    Unspecified venous (peripheral) insufficiency    Unspecified vitamin D deficiency     Past Surgical History:  Procedure Laterality Date   Cataract surg  2009   CHOLECYSTECTOMY  1997   DILATION AND CURETTAGE OF UTERUS  1960's   Left elbow surgery  1992    Current Medications: No outpatient medications have been marked as taking for the 08/20/23 encounter (Appointment) with [place], Jaivyn Gulla M, MD.     Allergies:   Nifedipine, Codeine, Metformin and related, Morphine and codeine, Other, and Penicillins   Social History   Socioeconomic History   Marital status: Widowed    Spouse name: Nichole Silva   Number of children: 3   Years of education: Not on file   Highest  education level: Some college, no degree  Occupational History   Occupation: Retired    Associate Professor: [employer]  Tobacco Use   Smoking status: Never   Smokeless tobacco: Never  Vaping Use   Vaping status: Never Used  Substance and Sexual Activity   Alcohol use: No    Alcohol/week: 0.0 standard drinks of alcohol   Drug use: No   Sexual activity: Not Currently  Other Topics Concern   Not on file  Social History Narrative   Not on file   Social Drivers of Health   Financial Resource Strain: Low Risk  (02/18/2023)   Overall Financial Resource Strain (CARDIA)    Difficulty of Paying Living Expenses: Not very hard  Food Insecurity: Food Insecurity Present (08/10/2023)   Hunger Vital Sign    Worried About Running Out of Food in the Last Year: Sometimes true    Ran Out of Food in the Last Year: Sometimes true  Transportation Needs: No Transportation Needs (06/18/2023)   PRAPARE - Administrator, Civil Service (Medical): No    Lack of Transportation (Non-Medical): No  Physical Activity: Insufficiently Active (02/18/2023)   Exercise Vital Sign    Days of Exercise per Week: 2 days    Minutes of Exercise per Session: 20 min  Stress: No Stress Concern Present (02/18/2023)   [employer] of Occupational Health -  Occupational Stress Questionnaire    Feeling of Stress : Only a little  Social Connections: Moderately Integrated (02/18/2023)   Social Connection and Isolation Panel [NHANES]    Frequency of Communication with Friends and Family: More than three times a week    Frequency of Social Gatherings with Friends and Family: More than three times a week    Attends Religious Services: More than 4 times per year    Active Member of [employer] or Organizations: Yes    Attends Banker Meetings: More than 4 times per year    Marital Status: Widowed     Family History: The patient's ***family history includes Colon cancer in her sister and another family member;  Emphysema in her mother; Esophageal cancer in an other family member; Heart disease in her father; Liver disease in an other family member.  ROS:   Please see the history of present illness.    *** All other systems reviewed and are negative.  EKGs/Labs/Other Studies Reviewed:    The following studies were reviewed today: ***      Recent Labs: 09/03/2022: ALT 10; TSH 1.77 02/25/2023: BUN 16; Creatinine, Ser 0.76; Hemoglobin 11.8; Platelets 231.0; Potassium 4.2; Sodium 138  Recent Lipid Panel    Component Value Date/Time   CHOL 158 09/03/2022 0944   TRIG 102.0 09/03/2022 0944   HDL 61.60 09/03/2022 0944   CHOLHDL 3 09/03/2022 0944   VLDL 20.4 09/03/2022 0944   LDLCALC 76 09/03/2022 0944   LDLCALC 78 12/06/2019 1521   LDLDIRECT 102.0 07/29/2015 1053     Risk Assessment/Calculations:   {Does this patient have ATRIAL FIBRILLATION?:[id number]}  No BP recorded.  {Refresh Note OR Click here to enter BP  :1}***         Physical Exam:    VS:  There were no vitals taken for this visit.    Wt Readings from Last 3 Encounters:  08/11/23 111 lb (50.3 kg)  06/25/23 112 lb 4.8 oz (50.9 kg)  06/11/23 106 lb (48.1 kg)     GEN: *** Well nourished, well developed in no acute distress HEENT: Normal NECK: No JVD; No carotid bruits LYMPHATICS: No lymphadenopathy CARDIAC: ***RRR, no murmurs, rubs, gallops RESPIRATORY:  Clear to auscultation without rales, wheezing or rhonchi  ABDOMEN: Soft, non-tender, non-distended MUSCULOSKELETAL:  No edema; No deformity  SKIN: Warm and dry NEUROLOGIC:  Alert and oriented x 3 PSYCHIATRIC:  Normal affect   ASSESSMENT:    No diagnosis found. PLAN:    In order of problems listed above:  ***      {Are you ordering a CV Procedure (e.g. stress test, cath, DCCV, TEE, etc)?   Press F2        :[id number]}    Medication Adjustments/Labs and Tests Ordered: Current medicines are reviewed at length with the patient today.  Concerns regarding  medicines are outlined above.  No orders of the defined types were placed in this encounter.  No orders of the defined types were placed in this encounter.   There are no Patient Instructions on file for this visit.   Signed, Amaani Guilbault [place], MD  08/16/2023 7:55 AM    [facility]

## 2023-08-17 ENCOUNTER — Encounter: Payer: Self-pay | Admitting: Gastroenterology

## 2023-08-17 ENCOUNTER — Other Ambulatory Visit: Payer: Self-pay | Admitting: Internal Medicine

## 2023-08-17 DIAGNOSIS — E785 Hyperlipidemia, unspecified: Secondary | ICD-10-CM

## 2023-08-17 NOTE — Progress Notes (Signed)
 Nichole Silva, Your vitamin B12 levels are still markedly elevated.  This likely indicates that you are still taking B12 supplements.  Your folate level is also elevated.  Your vitamin D level did decrease, but is still at the upper level of what is considered normal. I would discussed the necessity for ongoing B12/folate/vitamin D supplementation with Dr. Yetta Barre, but based on your blood levels, you do not currently need supplementation, or may need to reduce dosing.

## 2023-08-19 ENCOUNTER — Other Ambulatory Visit

## 2023-08-20 ENCOUNTER — Ambulatory Visit: Payer: Medicare Other | Admitting: Cardiology

## 2023-08-23 ENCOUNTER — Other Ambulatory Visit: Payer: Self-pay | Admitting: Internal Medicine

## 2023-08-23 DIAGNOSIS — I10 Essential (primary) hypertension: Secondary | ICD-10-CM

## 2023-08-23 DIAGNOSIS — D520 Dietary folate deficiency anemia: Secondary | ICD-10-CM

## 2023-08-26 ENCOUNTER — Encounter: Payer: Self-pay | Admitting: Internal Medicine

## 2023-08-26 ENCOUNTER — Ambulatory Visit: Payer: Medicare Other | Admitting: Internal Medicine

## 2023-08-26 VITALS — BP 126/64 | HR 88 | Temp 98.0°F | Ht 62.0 in | Wt 109.8 lb

## 2023-08-26 DIAGNOSIS — D520 Dietary folate deficiency anemia: Secondary | ICD-10-CM

## 2023-08-26 DIAGNOSIS — I10 Essential (primary) hypertension: Secondary | ICD-10-CM

## 2023-08-26 DIAGNOSIS — E118 Type 2 diabetes mellitus with unspecified complications: Secondary | ICD-10-CM | POA: Diagnosis not present

## 2023-08-26 DIAGNOSIS — E785 Hyperlipidemia, unspecified: Secondary | ICD-10-CM | POA: Diagnosis not present

## 2023-08-26 LAB — CBC WITH DIFFERENTIAL/PLATELET
Basophils Absolute: 0 10*3/uL (ref 0.0–0.1)
Basophils Relative: 0.4 % (ref 0.0–3.0)
Eosinophils Absolute: 0.2 10*3/uL (ref 0.0–0.7)
Eosinophils Relative: 1.8 % (ref 0.0–5.0)
HCT: 38.7 % (ref 36.0–46.0)
Hemoglobin: 12.8 g/dL (ref 12.0–15.0)
Lymphocytes Relative: 18.4 % (ref 12.0–46.0)
Lymphs Abs: 1.6 10*3/uL (ref 0.7–4.0)
MCHC: 33.1 g/dL (ref 30.0–36.0)
MCV: 100.3 fl — ABNORMAL HIGH (ref 78.0–100.0)
Monocytes Absolute: 0.8 10*3/uL (ref 0.1–1.0)
Monocytes Relative: 8.6 % (ref 3.0–12.0)
Neutro Abs: 6.2 10*3/uL (ref 1.4–7.7)
Neutrophils Relative %: 70.8 % (ref 43.0–77.0)
Platelets: 242 10*3/uL (ref 150.0–400.0)
RBC: 3.86 Mil/uL — ABNORMAL LOW (ref 3.87–5.11)
RDW: 13.9 % (ref 11.5–15.5)
WBC: 8.7 10*3/uL (ref 4.0–10.5)

## 2023-08-26 LAB — HEPATIC FUNCTION PANEL
ALT: 10 U/L (ref 0–35)
AST: 18 U/L (ref 0–37)
Albumin: 4.5 g/dL (ref 3.5–5.2)
Alkaline Phosphatase: 50 U/L (ref 39–117)
Bilirubin, Direct: 0.1 mg/dL (ref 0.0–0.3)
Total Bilirubin: 0.4 mg/dL (ref 0.2–1.2)
Total Protein: 7.4 g/dL (ref 6.0–8.3)

## 2023-08-26 LAB — LIPID PANEL
Cholesterol: 153 mg/dL (ref 0–200)
HDL: 55.7 mg/dL (ref >39.00)
LDL Cholesterol: 70 mg/dL (ref 0–99)
NonHDL: 97.34
Total CHOL/HDL Ratio: 3
Triglycerides: 136 mg/dL (ref 0.0–149.0)
VLDL: 27.2 mg/dL (ref 0.0–40.0)

## 2023-08-26 LAB — BASIC METABOLIC PANEL WITH GFR
BUN: 16 mg/dL (ref 6–23)
CO2: 31 meq/L (ref 19–32)
Calcium: 10.2 mg/dL (ref 8.4–10.5)
Chloride: 102 meq/L (ref 96–112)
Creatinine, Ser: 0.78 mg/dL (ref 0.40–1.20)
GFR: 68.63 mL/min (ref >60.00)
Glucose, Bld: 126 mg/dL — ABNORMAL HIGH (ref 70–99)
Potassium: 4.3 meq/L (ref 3.5–5.1)
Sodium: 140 meq/L (ref 135–145)

## 2023-08-26 LAB — HEMOGLOBIN A1C: Hgb A1c MFr Bld: 5.4 % (ref 4.6–6.5)

## 2023-08-26 LAB — TSH: TSH: 1.4 u[IU]/mL (ref 0.35–5.50)

## 2023-08-26 NOTE — Patient Instructions (Signed)

## 2023-08-26 NOTE — Progress Notes (Unsigned)
 Subjective:  Patient ID: Nichole Silva, female    DOB: 11/13/1934  Age: 89 y.o. MRN: [id number]  CC: Hypertension, Anemia, and Diabetes   HPI Nichole Silva presents for f/up -----  Discussed the use of AI scribe software for clinical note transcription with the patient, who gave verbal consent to proceed.  History of Present Illness   Nichole Silva is an 89 year old female who presents with concerns about her IBS and medication issues. She is accompanied by her daughter, Olegario Messier.  She has been experiencing issues with her IBS, primarily presenting with diarrhea, without blood in her stool. A previous CT scan ruled out cancer in her stomach and colon. She occasionally experiences vomiting but not frequently. She uses Imodium for diarrhea and Imitrex for nausea. She notes difficulty in gaining weight, which she attributes to her IBS.  She is concerned about her medications and their appropriateness for her age. She has been seeing a new doctor for prescriptions. She plans to see a heart doctor if needed, although currently, there are no heart issues reported.  She has a history of a broken arm with a crushed elbow from 20 years ago, which causes pain if overused. She manages this pain with Tylenol, taking two strong tablets two to three times a day, which she finds effective.  No chest pain, shortness of breath, dizziness, or lightheadedness. She is active, attending church and Bible study regularly, and feels good when active. No symptoms of a urinary tract infection and no vaginal discharge or bleeding.  She lives in her own house and takes care of her affairs. Her daughter, who recently had open heart surgery, lives with her. She mentions family dynamics involving her children and the management of her assets.       Outpatient Medications Prior to Visit  Medication Sig Dispense Refill   acetaminophen (TYLENOL) 325 MG tablet Take 325 mg by mouth every 6 (six) hours as needed for mild pain or  moderate pain. Pain/headache     atorvastatin (LIPITOR) 40 MG tablet TAKE 1 TABLET(40 MG) BY MOUTH DAILY 90 tablet 0   cetirizine (ZYRTEC) 10 MG tablet Take 10 mg by mouth daily.     dicyclomine (BENTYL) 10 MG capsule TAKE 1 CAPSULE(10 MG) BY MOUTH THREE TIMES DAILY BEFORE MEALS 270 capsule 0   donepezil (ARICEPT) 5 MG tablet TAKE 1 TABLET(5 MG) BY MOUTH AT BEDTIME 90 tablet 1   fluticasone (FLONASE) 50 MCG/ACT nasal spray SHAKE LIQUID AND USE 2 SPRAYS IN EACH NOSTRIL AT BEDTIME 48 g 1   losartan (COZAAR) 50 MG tablet TAKE 1 TABLET(50 MG) BY MOUTH DAILY 90 tablet 0   mirtazapine (REMERON) 15 MG tablet Take 1 tablet (15 mg total) by mouth at bedtime. 30 tablet 1   PROAIR HFA 108 (90 Base) MCG/ACT inhaler INHALE 1 TO 2 PUFFS BY MOUTH EVERY 4 HOURS AS NEEDED FOR WHEEZE OR SHORTNESS OF BREATH (Patient taking differently: Inhale 1-2 puffs into the lungs every 4 (four) hours as needed for wheezing or shortness of breath.) 8.5 Inhaler 11   BREO ELLIPTA 200-25 MCG/INH AEPB INHALE 1 PUFF BY MOUTH EVERY DAY 60 each 3   estradiol (ESTRACE) 0.1 MG/GM vaginal cream Place 0.5g (fingertip amount) two times a week at the vaginal opening 42.5 g 11   estradiol (ESTRING) 7.5 MCG/24HR vaginal ring Place 1 each vaginally every 3 (three) months. (Patient not taking: Reported on 06/18/2023) 1 each 3   folic acid (FOLVITE) 1 MG tablet TAKE 1  TABLET(1 MG) BY MOUTH DAILY 90 tablet 1   methenamine (HIPREX) 1 g tablet Take 1 tablet (1 g total) by mouth 2 (two) times daily with a meal. 180 tablet 1   nitrofurantoin, macrocrystal-monohydrate, (MACROBID) 100 MG capsule Take 100 mg by mouth 2 (two) times daily. (Patient not taking: Reported on 06/18/2023)     trospium (SANCTURA) 20 MG tablet Take 1 tablet (20 mg total) by mouth 2 (two) times daily. 60 tablet 11   vitamin B-12 (CYANOCOBALAMIN) 1000 MCG tablet Take 1,000 mcg by mouth daily.     Vitamin D, Ergocalciferol, (DRISDOL) 1.25 MG (50000 UNIT) CAPS capsule TAKE 1 CAPSULE BY  MOUTH EVERY 7 DAYS 4 capsule 0   No facility-administered medications prior to visit.    ROS Review of Systems  Objective:  BP 126/64 (BP Location: Left Arm, Patient Position: Sitting, Cuff Size: Small)   Pulse 88   Temp 98 F (36.7 C) (Oral)   Ht 5\' 2"  (1.575 m)   Wt 109 lb 12.8 oz (49.8 kg)   SpO2 96%   BMI 20.08 kg/m   BP Readings from Last 3 Encounters:  08/26/23 126/64  06/11/23 119/71  06/10/23 118/62    Wt Readings from Last 3 Encounters:  08/26/23 109 lb 12.8 oz (49.8 kg)  08/11/23 111 lb (50.3 kg)  06/25/23 112 lb 4.8 oz (50.9 kg)    Physical Exam  Lab Results  Component Value Date   WBC 8.7 08/26/2023   HGB 12.8 08/26/2023   HCT 38.7 08/26/2023   PLT 242.0 08/26/2023   GLUCOSE 126 (H) 08/26/2023   CHOL 153 08/26/2023   TRIG 136.0 08/26/2023   HDL 55.70 08/26/2023   LDLDIRECT 102.0 07/29/2015   LDLCALC 70 08/26/2023   ALT 10 08/26/2023   AST 18 08/26/2023   NA 140 08/26/2023   K 4.3 08/26/2023   CL 102 08/26/2023   CREATININE 0.78 08/26/2023   BUN 16 08/26/2023   CO2 31 08/26/2023   TSH 1.40 08/26/2023   HGBA1C 5.4 08/26/2023   MICROALBUR 4.2 (H) 08/27/2022    MM 3D SCREENING MAMMOGRAM BILATERAL BREAST Result Date: 07/14/2023 CLINICAL DATA:  Screening. EXAM: DIGITAL SCREENING BILATERAL MAMMOGRAM WITH TOMOSYNTHESIS AND CAD TECHNIQUE: Bilateral screening digital craniocaudal and mediolateral oblique mammograms were obtained. Bilateral screening digital breast tomosynthesis was performed. The images were evaluated with computer-aided detection. COMPARISON:  Previous exam(s). ACR Breast Density Category b: There are scattered areas of fibroglandular density. FINDINGS: There are no findings suspicious for malignancy. IMPRESSION: No mammographic evidence of malignancy. A result letter of this screening mammogram will be mailed directly to the patient. RECOMMENDATION: Screening mammogram in one year. (Code:SM-B-01Y) BI-RADS CATEGORY  1: Negative.  Electronically Signed   By: Amie Portland M.D.   On: 07/14/2023 10:56    Assessment & Plan:  Type II diabetes mellitus with manifestations (HCC) -     Basic metabolic panel with GFR; Future -     Hemoglobin A1c; Future  Hyperlipidemia with target LDL less than 100 -     Lipid panel; Future -     TSH; Future -     Hepatic function panel; Future  Essential hypertension -     Basic metabolic panel with GFR; Future -     TSH; Future  Dietary folate deficiency anemia -     CBC with Differential/Platelet; Future     Follow-up: Return in about 6 months (around 02/25/2024).  Sanda Linger, MD

## 2023-09-01 ENCOUNTER — Ambulatory Visit: Payer: Self-pay

## 2023-09-02 NOTE — Patient Outreach (Signed)
 Complex Care Management   Visit Note  09/02/2023  Name:  Nichole Silva MRN: [id number] DOB: [birth year]  Situation: Follow up-Referral received for Complex Care Management related to  DM, HTN  I obtained verbal consent from [facility](daughter/dpr).  Visit completed with [name](dpr/daughter)  on the phone  Background:   Past Medical History:  Diagnosis Date   Allergic rhinitis, cause unspecified    Anxiety state, unspecified    Asthma    Coronary atherosclerosis of unspecified type of vessel, native or graft    Depression    Female bladder prolapse    Hyperlipidemia    IBS (irritable bowel syndrome)    Osteoarthrosis, unspecified whether generalized or localized, unspecified site    Type II or unspecified type diabetes mellitus without mention of complication, not stated as uncontrolled    Unspecified chronic bronchitis (HCC)    Unspecified essential hypertension    Unspecified hearing loss    Unspecified venous (peripheral) insufficiency    Unspecified vitamin D deficiency     Assessment: Patient hard of hearing, therefore daughter, Coralie Keens, is contact for telephone calls. Patient daughter reports patient is doing well. Per PCP office visit on 08/26/23, Blood sugar well controlled, Blood pressure well controlled. Patient is going to church twice a week. Ms. Langston Masker reports that [employer] will be assisting patient with Modifying patient's home. Daughter denies any questions or concerns. She states patient will be updating/completing Advance directives and request an Advance directive packet.  Patient Reported Symptoms: Cognitive    Neurological No symptoms reported    HEENT No symptoms reported    Cardiovascular No symptoms reported    Respiratory No symptoms reported    Endocrine No symptoms reported    Gastrointestinal No symptoms reported    Genitourinary No symptoms reported    Integumentary No symptoms reported    Musculoskeletal No  symptoms reported    Psychosocial No symptoms reported     There were no vitals filed for this visit.  Medications Reviewed Today     Reviewed by Colletta Maryland, RN (Registered Nurse) on 09/01/23 at 1154  Med List Status: <None>   Medication Order Taking? Sig Documenting Provider Last Dose Status Informant  acetaminophen (TYLENOL) 325 MG tablet [id number] Yes Take 325 mg by mouth every 6 (six) hours as needed for mild pain or moderate pain. Pain/headache [provider] Taking Active Multiple Informants  atorvastatin (LIPITOR) 40 MG tablet [id number] Yes TAKE 1 TABLET(40 MG) BY MOUTH DAILY Etta Grandchild, MD Taking Active   cetirizine (ZYRTEC) 10 MG tablet [id number] Yes Take 10 mg by mouth daily. [provider] Taking Active Multiple Informants  dicyclomine (BENTYL) 10 MG capsule [id number] Yes TAKE 1 CAPSULE(10 MG) BY MOUTH THREE TIMES DAILY BEFORE MEALS Etta Grandchild, MD Taking Active   donepezil (ARICEPT) 5 MG tablet [id number] Yes TAKE 1 TABLET(5 MG) BY MOUTH AT BEDTIME Etta Grandchild, MD Taking Active   fluticasone [facility]) 50 MCG/ACT nasal spray [id number] Yes SHAKE LIQUID AND USE 2 SPRAYS IN EACH NOSTRIL AT BEDTIME Etta Grandchild, MD Taking Active            Med Note Earlene Plater, [place]   Fri Jun 18, 2023  4:09 PM) Uses as needed  losartan (COZAAR) 50 MG tablet [id number] Yes TAKE 1 TABLET(50 MG) BY MOUTH DAILY Etta Grandchild, MD Taking Active   mirtazapine (REMERON) 15 MG tablet [id number] Yes Take 1 tablet (15 mg total) by mouth at bedtime. Tiajuana Amass  E, MD Taking Active   PROAIR HFA 108 (90 Base) MCG/ACT inhaler [id number] Yes INHALE 1 TO 2 PUFFS BY MOUTH EVERY 4 HOURS AS NEEDED FOR WHEEZE OR SHORTNESS OF BREATH  Patient taking differently: Inhale 1-2 puffs into the lungs every 4 (four) hours as needed for wheezing or shortness of breath.   Etta Grandchild, MD Taking Active           Recommendation:   PCP Follow-up Advance care packet mailed to  patients home  Follow Up Plan:   Telephone follow up appointment date/time:  09/16/23 at 11:30 am  Kathyrn Sheriff, RN, MSN, BSN, CCM [place]  [facility], Population Health Case Manager Phone: [phone]

## 2023-09-13 ENCOUNTER — Other Ambulatory Visit: Payer: Self-pay | Admitting: Internal Medicine

## 2023-09-13 DIAGNOSIS — F0390 Unspecified dementia without behavioral disturbance: Secondary | ICD-10-CM

## 2023-09-15 ENCOUNTER — Other Ambulatory Visit: Payer: Self-pay | Admitting: Internal Medicine

## 2023-09-15 DIAGNOSIS — F0390 Unspecified dementia without behavioral disturbance: Secondary | ICD-10-CM

## 2023-09-15 MED ORDER — DONEPEZIL HCL 5 MG PO TABS: 5.0000 mg | ORAL_TABLET | Freq: Every day | ORAL | 0 refills | Status: DC

## 2023-09-15 NOTE — Telephone Encounter (Signed)
 Copied from CRM [id number]. Topic: Clinical - Medication Refill >> Sep 15, 2023  4:14 PM Ivette P wrote: Most Recent Primary Care Visit:  Provider: Arcadio Knuckles  Department: [facility]  Visit Type: OFFICE VISIT  Date: 08/26/2023  Medication: donepezil  (ARICEPT ) 5 MG tablet  Has the patient contacted their pharmacy? Yes (Agent: If no, request that the patient contact the pharmacy for the refill. If patient does not wish to contact the pharmacy document the reason why and proceed with request.) (Agent: If yes, when and what did the pharmacy advise?)  Is this the correct pharmacy for this prescription? Yes If no, delete pharmacy and type the correct one.  This is the patient's preferred pharmacy:  [id number] [place], [place] - [phone] AT [facility] & [address] [place] [address] Phone: [phone] Fax: [phone]   Has the prescription been filled recently? No, 03/18/2023  Is the patient out of the medication? Yes  Has the patient been seen for an appointment in the last year OR does the patient have an upcoming appointment? Yes, 08/26/2023  Can we respond through MyChart? No  Agent: Please be advised that Rx refills may take up to 3 business days. We ask that you follow-up with your pharmacy.

## 2023-09-16 ENCOUNTER — Other Ambulatory Visit: Payer: Self-pay

## 2023-09-21 ENCOUNTER — Ambulatory Visit (INDEPENDENT_AMBULATORY_CARE_PROVIDER_SITE_OTHER): Payer: Medicare Other | Admitting: Obstetrics and Gynecology

## 2023-09-21 ENCOUNTER — Encounter: Payer: Self-pay | Admitting: Obstetrics and Gynecology

## 2023-09-21 VITALS — BP 111/71 | HR 71

## 2023-09-21 DIAGNOSIS — N812 Incomplete uterovaginal prolapse: Secondary | ICD-10-CM

## 2023-09-21 DIAGNOSIS — N3281 Overactive bladder: Secondary | ICD-10-CM | POA: Diagnosis not present

## 2023-09-21 DIAGNOSIS — N393 Stress incontinence (female) (male): Secondary | ICD-10-CM | POA: Diagnosis not present

## 2023-09-21 NOTE — Progress Notes (Signed)
 [facility]   Subjective:     Chief Complaint:  Chief Complaint  Patient presents with   Pessary Check   History of Present Illness: Nichole Silva is a 89 y.o. female with stage II pelvic organ prolapse, recurrent UTI, stress incontinence, and OAB who presents for a pessary check. She is using a size 3 ring with support pessary. The pessary has been working well and she has no complaints. She is not using vaginal estrogen. She denies vaginal bleeding.  Last prescribed trospium  20mg  BID for OAB symptoms. She decided that after reading about risks and side effects that she did not want to try the medication. She did not start the vaginal estrogen cream because she has not had a UTI in some time.   Past Medical History: Patient  has a past medical history of Allergic rhinitis, cause unspecified, Anxiety state, unspecified, Asthma, Coronary atherosclerosis of unspecified type of vessel, native or graft, Depression, Female bladder prolapse, Hyperlipidemia, IBS (irritable bowel syndrome), Osteoarthrosis, unspecified whether generalized or localized, unspecified site, Type II or unspecified type diabetes mellitus without mention of complication, not stated as uncontrolled, Unspecified chronic bronchitis (HCC), Unspecified essential hypertension, Unspecified hearing loss, Unspecified venous (peripheral) insufficiency, and Unspecified vitamin D  deficiency.   Past Surgical History: She  has a past surgical history that includes Dilation and curettage of uterus (1960's); Cholecystectomy (1997); Left elbow surgery (1992); and Cataract surg (2009).   Medications: She has a current medication list which includes the following prescription(s): acetaminophen , atorvastatin , cetirizine, dicyclomine , donepezil , fluticasone , losartan , mirtazapine , and proair  hfa.   Allergies: Patient is allergic to nifedipine , codeine, metformin and related, morphine  and codeine, other, and penicillins.   Social  History: Patient  reports that she has never smoked. She has never used smokeless tobacco. She reports that she does not drink alcohol and does not use drugs.      Objective:    Physical Exam: BP 111/71   Pulse 71  Gen: No apparent distress, A&O x 3. Detailed Urogynecologic Evaluation:  Pelvic Exam: Normal external female genitalia; Bartholin's and Skene's glands normal in appearance; urethral meatus normal in appearance, no urethral masses or discharge. The pessary was noted to be in place. It was removed and cleaned. Speculum exam revealed no lesions in the vagina. The pessary was replaced. It was comfortable to the patient and fit well.      Assessment/Plan:    Assessment: Ms. Nichole Silva is a 89 y.o. with stage II pelvic organ prolapse, stress incontinence, and OAB here for a pessary check. She is doing well.  Plan: - Continue #3 RWS. Plan for cleanings q 3 months - We discussed an alternative medication for OAB symptoms with potentially less symptoms, however the beta agonist medications are not covered by her insurance. She is not interested in other therapies and is content with using her incontinence diapers.   Return 3 months for pessary check  Arma Lamp, MD    Time spent: I spent 20 minutes dedicated to the care of this patient on the date of this encounter to include pre-visit review of records, face-to-face time with the patient and post visit documentation.

## 2023-09-23 ENCOUNTER — Encounter

## 2023-09-24 ENCOUNTER — Other Ambulatory Visit: Payer: Self-pay

## 2023-09-24 NOTE — Patient Outreach (Signed)
 Aging Gracefully Program  09/24/2023  Nichole Silva [birth year] [id number]   [facility] Evaluation Interviewer made contact with patient. Aging Gracefully survey completed.   Interviewer will send referral to RN and OT for follow up.    [facility]  Population Health Care Management Assistant  Direct Dial: [phone]  Fax: [phone] Website: [email or web address]

## 2023-09-28 ENCOUNTER — Other Ambulatory Visit: Payer: Self-pay | Admitting: Internal Medicine

## 2023-09-28 DIAGNOSIS — K589 Irritable bowel syndrome without diarrhea: Secondary | ICD-10-CM

## 2023-09-30 ENCOUNTER — Other Ambulatory Visit: Payer: Self-pay | Admitting: Internal Medicine

## 2023-09-30 DIAGNOSIS — F0390 Unspecified dementia without behavioral disturbance: Secondary | ICD-10-CM

## 2023-10-01 ENCOUNTER — Other Ambulatory Visit: Payer: Self-pay

## 2023-10-01 ENCOUNTER — Telehealth: Payer: Self-pay | Admitting: Internal Medicine

## 2023-10-01 DIAGNOSIS — K589 Irritable bowel syndrome without diarrhea: Secondary | ICD-10-CM

## 2023-10-01 MED ORDER — DICYCLOMINE HCL 10 MG PO CAPS: 10.0000 mg | ORAL_CAPSULE | Freq: Three times a day (TID) | ORAL | 1 refills | Status: DC

## 2023-10-01 NOTE — Telephone Encounter (Signed)
 Medication refill has been sent to Dr. Rochelle Chu and patient has been made aware.

## 2023-10-01 NOTE — Telephone Encounter (Signed)
 Copied from CRM [id number]. Topic: Clinical - Medication Question >> Oct 01, 2023 12:17 PM Allyne Areola wrote: Reason for CRM: Patient is calling to follow up on a medication request that was faxed by [facility] for dicyclomine  (BENTYL ) 10 MG capsule , she will be out of the medication today and did not want to go the weekend without it. Original request was sent on Tuesday and more were sent Wednesday and Thursday. I did advice of the turn around time.

## 2023-10-08 ENCOUNTER — Other Ambulatory Visit: Payer: Self-pay | Admitting: Rehabilitation

## 2023-10-09 NOTE — Patient Outreach (Signed)
 Aging Gracefully Program  OT Initial Visit  10/09/2023  Nichole Silva March 24, 1935 [id number]  Visit:  1- Initial Visit  Start Time:  1400 End Time:  1600 Total Minutes:  120  CCAP: Typical Daily Routine: Typical Daily Routine:: wake up, manage needs in bathroom, clean up bed, eat breakfast and lunch provided by daughter Adriana Hopping. Activites involve tv, read, sit on porch outside What Types Of Care Problems Are You Having Throughout The Day?: strength is improving, is careful walking. Help needed with meal prep and house cleaning- provided by daughters. Do You Think You Need Other Types Of Help?: current help is fine What Do You Think Would Make Everyday Life Easier For You?: To be more active What Is A Good Day Like?: can walk to sister's house (4 houses away) What Is A Bad Day Like?: IBS flare up Do You Have Time For Yourself?: yes Patient Reported Equipment: Patient Reported Equipment Currently Used: Raised Engineer, civil (consulting), Chair Lift, Engineer, materials, Paediatric nurse (standard walker) Functional Mobility-Walking Indoors/Getting Around the [employer]:   Functional Mobility-Walk A Block: Walk A Block: A Lot Of Difficulty Do You:: Use Personal Assistance Importance Of Learning New Strategies:: A Little Other Comments:: walks to sister's house 4 houses away. No device, sister walks with her for safety. Walking further would be unable to to do Observation: Walk A Block: N/O Functional Mobility-Maintain Balance While Showering: Maintaining Balance While Showering: A Lot Of Difficulty Do You:: Use A Device Importance Of Learning New Strategies:: Very Much Other Comments:: currently does not shower, uses other ways to clean self. Has a shower chair but area is too tight to get in. Observation: Maintain Balance While Showering: N/O Safety: Extreme Risk Efficiency: Not At All Intervention: Yes Functional Mobility-Stooping, Crouching, Kneeling To Retreive Item: Stooping, Crouching, or Kneeling To Retrieve  Item: Unable To Do Other Comments:: reports unable to kneel on knees due to accident years ago and LUE also weak with limited ROM. Would have to call Fire Department for help Observation: Alyson Back, or Kneel: N/O Functional Mobility-Bending From Standing Position To Pick Up Clothing Off The Floor: Bending Over From Standing Position To Pick Up Clothing Off The Floor: A Little Difficulty Do You:: No Device/No Assistance Importance Of Learning New Strategies:: A Little Observation: Bending Over From Standing Postion To Pick Up Clothing Off Of Floor: Independent Safety: A Little Risk Efficiency: Somewhat Intervention: Yes Functional Mobility-Reaching For Items Above Shoulder Level: Reaching For Items Above Shoulder Level: Moderate Difficulty Do You:: No Device/No Assistance Importance Of Learning New Strategies:: Not At All Other Comments:: can reach with RUE, unable LUE due to old injury and limited ROM Functional Mobility-Climb 1 Flight Of Stairs: Climb 1 Flight Of Stairs: A Lot Of Difficulty Do You:: N/A- Not Applicable Importance Of Learning New Strategies:: A Little Other Comments:: unable, avoids stairs. Needs hand rail for 1-3 steps Observation: Climb One Flight Of Stairs: N/O Functional Mobility-Move In And Out Of Chair: Move In and Out Of A Chair: Moderate Difficulty Do You:: Use A Device Importance Of Learning New Strategies:: Moderate Observation: Move In And Out Of Chair: Independent (Independent off high surface, unable from lower chair) Safety: Moderate/Extreme Risk Efficiency: Not At All Intervention: Yes Other Comments:: intervention for lower suface in home. Functional off couch with riser Functional Mobility-Move In And Out Of Bed: Move In and Out Of Bed: No Difficulty Do You:: Use A Device Importance Of Learning New Strategies:: Not At All Other Comments:: risers under the couch or uses chair lift  for sleep Functional Mobility-Move In And Out Of  Bath/Shower: Move In And Out Of A Bath/Shower: A Lot Of Difficulty Do You:: Use Both A Device And Personal Assistance Importance Of Learning New Strategies:: Very Much Other Comments:: currently avoids taking a shower, has a shower chair but unsafe and difficult to step in Observation: Move In And Out Of Bath/Shower: N/O Safety: Extreme Risk Efficiency: Not At All Intervention: Yes Functional Mobility-Get On And Off Toilet: Getting Up From The Floor: Unable To Do Do You:: Use Personal Assistance Importance Of Learning New Strategies:: A Little Other Comments:: reports calling the fire department to help stand up Functional Mobility-Into And Out Of Car, Not Including Driving: Into  And Out Of Car, Not Including Driving: A Lot Of Difficulty Do You:: Use Personal Assistance Importance Of Learning New Strategies:: Moderate Other Comments:: difficulty standing from the car. Goes to church with friend or daughters. More difficult in a lower car versus an SUV Observation: Into And Out Of Car, Not Including Driving: N/O Safety: A Little Risk Efficiency: Not At All Intervention: Yes      Activities of Daily Living-Bathing/Showering: ADL-Bathing/Showering: A Lot of Difficulty Do You:: No Device/No Assistance Importance Of Learning New Strategies: Moderate Other Comments:: currently avoids; cleans self alternative methods outof the shower ADL Observation: Bathing/Showering: N/O Safety: Moderate/Extreme Risk Efficiency: Not At All Intervention: Yes Activities of Daily Living-Personal Hygiene and Grooming: Personal Hygiene and Grooming: Moderate Difficulty Do You:: Use Personal Assistance Importance Of Learning New Strategies: Not At All Other Comments:: has help to manage hair, can brush with RUE Activities of Daily Living-Toilet Hygiene: Toilet Hygiene: No Difficulty Do You:: Use A Device Importance Of Learning New Strategies: Not At All Other Comments:: reports she has this managed  with the raised toilet seat and can manage clean up due to IBS. Knows what to do Activities of Daily Living-Put On And Take Off Undergarments (Incl. Fasteners): Put On And Take Off Undergarments (Incl. Fasteners): A Little Difficulty Do You:: No Device/No Assistance Importance Of Learning New Strategies: Not At All Activities of Daily Living-Put On And Take Off Shirt/Dress/Coat (Incl. Fasteners): Put On And Take Off Shirt/Dress/Coat (Incl. Fasteners): A Little Difficulty Do You:: No Device/No Assistance Importance Of Learning New Strategies: Not At All Activities of Daily Living-Put On And Take Off Socks And Shoes: Put On And Take Off Socks And  Shoes: No Difficulty Do You:: No Device/No Assistance Importance Of Learning New Strategies: Not At All Activities of Daily Living-Feed Self: Feed Self: No Difficulty Do You:: No Device/No Assistance Importance Of Learning New Strategies: Not At All Activities of Daily Living-Rest And Sleep: Rest and Sleep: No Difficulty Do You:: No Device/No Assistance Importance Of Learning New Strategies: Not At All Activities of Daily Living-Sexual Activity: Sexual  Activity: N/A    Instrumental Activities of Daily Living-Light Homemaking (Laundry, Straightening Up, Vacuuming):  Do Light Homemaking (Laundry, Straightening Up, Vacuuming): A Lot of Difficulty Do You:: Use Personal Assistance Importance Of Learning New Strategies: Not At All Other Comments:: Daughter manages Instrumental Activities of Daily Living-Making A Bed: Making a Bed: A Little Difficulty Do You:: No Device/No Assistance Importance Of Learning New Strategies: Not At All Other Comments:: can manage using raised surface to make her bed on the couch IADL Observation: Making A Bed: N/O Instrumental Activities of Daily Living-Washing Dishes By Hand While Standing At The Sink: Washing Dishes By Hand While Standing At The Sink: No Difficulty Do You:: No Device/No Assistance Importance  Of Learning New  Strategies: Not At All Instrumental Activities of Daily Living-Grocery Shopping: Do Grocery Shopping: Moderate Difficulty Do You:: Use Both A Device And Personal Assistance Importance Of Learning New Strategies: Not At All Instrumental Activities of Daily Living-Use Telephone: Use Telephone: A Lot of Difficulty Do You:: Use A Device Importance Of Learning New Strategies: Not At All Other Comments:: has closed caption phone in back room IADL Observation: Use Telephone: N/O Instrumental Activities of Daily Living-Financial Management: Financial Management: Moderate Difficulty Do You:: Use Personal Assistance Importance Of Learning New Strategies: Not At All Other Comments:: daughter Thersia Flax manages Instrumental Activities of Daily Living-Medications: Take Medications: No Difficulty Do You:: Use A Device Importance Of Learning New Strategies: Not At All Other Comments:: uses a pill box organizer, daughter sets up Instrumental Activities of Daily Living-Health Management And Maintenance: Health Management & Maintenance: No Difficulty Do You:: N/A-Not Applicable Importance Of Learning New Strategies: N/A Instrumental Activities of Daily Living-Meal Preparation and Clean-Up: Meal Preparation and Clean-Up: Moderate Difficulty Do You:: Use Personal Assistance Importance Of Learning New Strategies: Not At All Instrumental Activities of Daily Living-Provide Care For Others/Pets: Care For Others/Pets: N/A Do You:: N/A-Not Applicable Other Comments:: daughters manage the cats Instrumental Activities of Daily Living-Take Part In Organized Social Activities: Take Part In Organized Social Activities: A Little Difficulty Do You:: Use Personal Assistance Importance Of Learning New Strategies: A Little Other Comments:: difficulty getting out of the car at the events, but manages going to church events 2 x week Instrumental Activities of Daily Living-Leisure Participation: Leisure  Participation: N/A Instrumental Activities of Daily Living-Employment/Volunteer Activities: Employment/Volunteer Activities: N/A Instrumental Activities of Daily Living-Other Identifies:    Readiness To Change Score:  Readiness to Change Score: 8.67  Home Environment Assessment: Outside Home Entry:: has metal ramp installed by [place] for husband. Has another metal ramp into the home, which is separating from the threshold. No railing on sides of stoop Entryway/Foyer:: step into main living area with floor carpet. Living Room:: back living room has the closed caption phone and a recliner that the client cannot get out of. She sits on the arm rest to listen to her messages. Kitchen:: open Stairs:: in home 1 step out of kitchen then another small step to the [employer]:: Main bathroom in hallway: unable to use shower due to pipes being cut previous attempt to fix the shower. Has a grab bar on wall-horizontal for balance across from toilet. Toilet has raised seat with hand rails. Second bathroom in back of house has a shower seat but isn't being used due to tight set up. Unable to step into the shower Laundry:: 2 steps to access Mailbox:: at curb, client walks to retrieve  Durable Medical Equipment:  Has raised toilet seat with handles  Patient Education: Education Provided: Yes Education Details: gave folder with [employer] for Safety brochure, copy of consent and goals. Explained the program Person(s) Educated: Patient, Child(ren) Comprehension: Verbalized Understanding  Goals:  Goals Addressed             This Visit's Progress    AG OT Patient Stated       Improve safety and mobility in the bathroom     AG OT Patient Stated       Improve safety and mobility in/out of the house     AG OT Patient Stated       Improve mobility in/out of the car        Post Clinical Reasoning: Client Action (Goal) One Interventions: Improve safety and  mobility in the bathroom Client Action  (Goal) Two Interventions: Improve safety and mobility in/out of the house Client Action (Goal) Three Interventions: Improve mobility in/out of the car

## 2023-10-11 ENCOUNTER — Other Ambulatory Visit: Payer: Self-pay | Admitting: *Deleted

## 2023-10-11 NOTE — Patient Outreach (Signed)
 Aging Gracefully Program  10/11/2023  Kymani Laursen 1935/02/23 [id number]   Telephone call made to schedule Aging Gracefully RN home visit. Spoke with Thersia Flax (daughter/DPR). Thersia Flax reports she schedules all of the appointments for both her mother and sister.   Scheduled Aging Gracefully home visit for tomorrow 10/12/23 at 1 pm. Provided Thersia Flax with writer's telephone number.   Nolberto Batty, MSN, RN, BSN [place]  [facility], Healthy Communities RN Case Manager for Aging Gracefully Direct Dial: [phone]

## 2023-10-12 ENCOUNTER — Encounter: Payer: Self-pay | Admitting: *Deleted

## 2023-10-12 ENCOUNTER — Other Ambulatory Visit: Payer: Self-pay | Admitting: *Deleted

## 2023-10-12 ENCOUNTER — Telehealth: Payer: Self-pay | Admitting: Obstetrics and Gynecology

## 2023-10-12 NOTE — Patient Instructions (Signed)
 Visit Information  Thank you for taking time to visit with me today. Please don't hesitate to contact me if I can be of assistance to you before our next scheduled home appointment.  Following are the goals we discussed today:   Goals Addressed               This Visit's Progress     AG RN (pt-stated)        10/12/23  Assessment: Nichole Silva resides with her daughter Maureen Sour who is also a client of Aging Gracefully program. Nichole Silva reports she goes to church on Mondays and Wednesdays. States she walks to her sister's house up the street from her. Reports walking independently without device. Denies any current pain. Reports chronic pain to left elbow due to injury and elbow surgery many years ago. Reports feeling like her elbow makes her off balance. Reports pain level 0 out of 10 during visit. Nichole Silva has 2 cats in the home. Nichole Silva reports she has not had any recent falls.   Interventions: Provided chronic conditions booklet and writer's contact information. Discussed outpatient therapy for balance control. Nichole Silva reports she is not interested in outpatient therapy at this time. Discussed fall precautions and strategies.   Plan: Scheduled next home visit for June 26th at 2 pm.    CLIENT/RN ACTION PLAN - FALL PREVENTION  Registered Nurse:  Nolberto Batty Date: 10/12/23  Client Name: Nichole Silva Client ID:    Target Area:  FALL PREVENTION    Why Problem May Occur: When off balance   Target Goal: No falls over the next 160 days   STRATEGIES Coping Strategies Ideas  Change Positions Slowly: lying to sitting, sitting to standing Reviewed 10/12/23 Changing positions can make people lightheaded. When getting up in the morning sit for a few minutes, before standing.  Stand for a few minutes before walking and hold on to sturdy furniture or countertop.    Drink water Reviewed 10/12/23 Dehydration can make people dizzy. Ask your healthcare provider how much water you can  drink. Decrease caffeine, caffeine makes you urinate a lot, which can make you dehydrated.   If you fall, tell someone Reviewed 10/12/23 Tell a friend or family member even if you didn't get hurt. Tell your healthcare provider if you fall.  They can help you figure out why.   Get your vision and hearing checked Reviewed 10/12/23 Poor vision and hearing loss can make people fall. Glaucoma and diabetes can cause poor vision.   Other    Prevention Ideas  Review your Medicines Reviewed 10/12/23 Many medicines can make you dizzy or sleepy and increase your risk of falling. Your Aging Gracefully Nurse will look at your medications and see if you are taking any that might cause falling.   Activity and Exercise Reviewed 10/12/23 Aging Gracefully exercises Walking (inside or outside) [employer]:  cooking, cleaning, laundry Exercise while watching TV Swimming or water aerobics.   Strengthen Bones Reviewed 10/12/23 Exercise makes your bones stronger. Vitamin D  and Calcium  make your bones stronger.  Ask your Healthcare provider if you should be taking them. Your body makes vitamin D  from the sun.  Sit in the sun for 10 minutes every day (without sunscreen).   Control your urine  Reviewed 10/12/23 Prevent constipation Cut back on caffeinated drinks. Don't wait to urinate. If diabetic, control your blood sugar. Ask your Aging Gracefully Nurse and Healthcare Provider  about urine control.   Control your blood sugar (  if you are diabetic) High blood sugar can cause frequent urination, poor vision, and numbness in your feet, which can make you fall.   Low blood sugar can cause confusion and dizziness.   Other coping strategies 1. 2. 3. 4. 5.   PRACTICE It is important to practice the strategies so we can determine if they will be effective in helping to reach the goal.    Follow these specific recommendations:        If strategy does not work the first time, try it  again.     We may make some changes over the next few sessions.       Nolberto Batty, MSN, RN, BSN [place]  [facility] Healthy Communities RN Case Manager for Aging Gracefully Direct Dial: [phone]                                        Our next appointment is on June 26th at 2pm.  If you are experiencing a Mental Health or Behavioral Health Crisis or need someone to talk to, please call the Suicide and Crisis Lifeline: 988 call the [place]  National Suicide Prevention Lifeline: 309-019-8972 or TTY: 256-084-5140 TTY 732-077-9065) to talk to a trained counselor call 1-800-273-TALK (toll free, 24 hour hotline) go to [facility] [address], [place] [phone]) call 911   The patient verbalized understanding of instructions, educational materials, and care plan provided today and agreed to receive a mailed copy of patient instructions, educational materials, and care plan.   Nolberto Batty, MSN, RN, BSN [place]  [facility], Healthy Communities RN Case Manager for Aging Gracefully Direct Dial: [phone]

## 2023-10-12 NOTE — Telephone Encounter (Signed)
 80- daughter cancelled pessary cleaning appt.  Will be following up with GYN and they will clean for her.  If there is any other issues they will call.  Right now she is doing great.

## 2023-10-12 NOTE — Patient Outreach (Addendum)
 Aging Gracefully Program  RN Visit  10/12/2023  Nichole Silva 01-31-1935 [id number]  Visit:  RN Visit Number: 1- Initial Visit  RN TIME CALCULATION: Start TIme:  RN Start Time Calculation: 1300 End Time:  RN Stop Time Calculation: 1340 Total Minutes:  RN Time Calculation: 40  Readiness To Change Score:  Readiness to Change Score: 10  Universal RN Interventions: Calendar Distribution: Yes Exercise Review: No Medications: Yes Medication Changes: Yes Mood: Yes Pain: Yes PCP Advocacy/Support: No Fall Prevention: Yes Incontinence: Yes Clinician View Of Client Situation: Arrived for joint home visit for Nichole Silva and [name]. Cats present and home smells of smoke. Nichole Silva sitting on couch upon arrival. Client View Of His/Her Situation: Nichole Silva denies any recent falls or pain. Reports having supportive daughters. Goes to church on Mondays and Wednesday.  Healthcare Provider Communication: Did Surveyor, mining With [employer] Provider?: No Healthcare Provider Response According to RN: n/a According to Client, Did PCP Report Communication With An Aging Gracefully RN?: No Healthcare Provider Response According To Client: n/a  Clinician View of Client Situation: Clinician View Of Client Situation: Arrived for joint home visit for Nichole Silva and [name]. Cats present and home smells of smoke. Nichole Silva sitting on couch upon arrival. [employer] of His/Her Situation: Client View Of His/Her Situation: Nichole Silva denies any recent falls or pain. Reports having supportive daughters. Goes to church on Mondays and Wednesday.  Medication Assessment: Do You Have Any Problems Paying For Medications?: No Where Does Client Store Medications?: Cabinet Can Client Read Pill Bottles?: Yes Does Client Use A Pillbox?: Yes Does Anyone Assist Client In Filling Pillbox?: Yes (Nichole Silva) Does Anyone Assist Client In Taking Medications?: No Do You Take Vitamin D ?: No Does  Client Have Any Questions Or Concerns About Medictions?: No Is Client Complaining Of Any Symptoms That Could Be Side Effects To Medications?: No Any Possible Changes In Medication Regimen?: No  OT Update: Pending [facility] assessments and contracts.  Session Summary: Nichole Silva very pleasant and engaging.   Goals Addressed               This Visit's Progress     AG RN (pt-stated)        10/12/23  Assessment: Nichole Silva resides with her [name] who is also a client of Aging Gracefully program. Nichole Silva reports she goes to church on Mondays and Wednesdays. States she walks to her sister's house up the street from her. Reports walking independently without device. Denies any current pain. Reports chronic pain to left elbow due to injury and elbow surgery many years ago. Reports feeling like her elbow makes her off balance. Reports pain level 0 out of 10 during visit. Nichole Silva has 2 cats in the home. Nichole Silva reports she has not had any recent falls.   Interventions: Provided chronic conditions booklet and writer's contact information. Discussed outpatient therapy for balance control. Nichole Silva reports she is not interested in outpatient therapy at this time. Discussed fall precautions and strategies.   Plan: Scheduled next home visit for June 26th at 2 pm.    CLIENT/RN ACTION PLAN - FALL PREVENTION  Registered Nurse:  Nichole Silva Date: 10/12/23  Client Name: Nichole Silva Client ID:    Target Area:  FALL PREVENTION    Why Problem May Occur: When off balance   Target Goal: No falls over the next 160 days   STRATEGIES Coping Strategies Ideas  Change Positions Slowly: lying to sitting, sitting  to standing Reviewed 10/12/23 Changing positions can make people lightheaded. When getting up in the morning sit for a few minutes, before standing.  Stand for a few minutes before walking and hold on to sturdy furniture or countertop.    Drink water Reviewed 10/12/23 Dehydration  can make people dizzy. Ask your healthcare provider how much water you can drink. Decrease caffeine, caffeine makes you urinate a lot, which can make you dehydrated.   If you fall, tell someone Reviewed 10/12/23 Tell a friend or family member even if you didn't get hurt. Tell your healthcare provider if you fall.  They can help you figure out why.   Get your vision and hearing checked Reviewed 10/12/23 Poor vision and hearing loss can make people fall. Glaucoma and diabetes can cause poor vision.   Other    Prevention Ideas  Review your Medicines Reviewed 10/12/23 Many medicines can make you dizzy or sleepy and increase your risk of falling. Your Aging Gracefully Nurse will look at your medications and see if you are taking any that might cause falling.   Activity and Exercise Reviewed 10/12/23 Aging Gracefully exercises Walking (inside or outside) [employer]:  cooking, cleaning, laundry Exercise while watching TV Swimming or water aerobics.   Strengthen Bones Reviewed 10/12/23 Exercise makes your bones stronger. Vitamin D  and Calcium  make your bones stronger.  Ask your Healthcare provider if you should be taking them. Your body makes vitamin D  from the sun.  Sit in the sun for 10 minutes every day (without sunscreen).   Control your urine  Reviewed 10/12/23 Prevent constipation Cut back on caffeinated drinks. Don't wait to urinate. If diabetic, control your blood sugar. Ask your Aging Gracefully Nurse and Healthcare Provider  about urine control.   Control your blood sugar (if you are diabetic) High blood sugar can cause frequent urination, poor vision, and numbness in your feet, which can make you fall.   Low blood sugar can cause confusion and dizziness.   Other coping strategies 1. 2. 3. 4. 5.   PRACTICE It is important to practice the strategies so we can determine if they will be effective in helping to reach the goal.    Follow these specific  recommendations:        If strategy does not work the first time, try it again.     We may make some changes over the next few sessions.       Nichole Batty, MSN, RN, BSN [place]  [facility], Healthy Communities RN Case Manager for Aging Gracefully Direct Dial: [phone]                                      Nichole Batty, MSN, RN, BSN [place]  [facility], Healthy Communities RN Case Manager for Aging Gracefully Direct Dial: [phone]

## 2023-10-21 ENCOUNTER — Other Ambulatory Visit: Payer: Self-pay

## 2023-10-21 NOTE — Patient Outreach (Signed)
 Complex Care Management   Visit Note  10/21/2023  Name:  Nichole Silva MRN: [id number] DOB: 15-Apr-1935  Situation: Referral received for Complex Care Management related to patient I obtained verbal consent from daughter/dpr.  Visit completed with patient  on the phone  Background:   Past Medical History:  Diagnosis Date   Allergic rhinitis, cause unspecified    Allergy    Anxiety state, unspecified    Asthma    Coronary atherosclerosis of unspecified type of vessel, native or graft    Depression    Female bladder prolapse    Hyperlipidemia    IBS (irritable bowel syndrome)    Osteoarthrosis, unspecified whether generalized or localized, unspecified site    Type II or unspecified type diabetes mellitus without mention of complication, not stated as uncontrolled    Unspecified chronic bronchitis (HCC)    Unspecified essential hypertension    Unspecified hearing loss    Unspecified venous (peripheral) insufficiency    Unspecified vitamin D  deficiency     Assessment: Daughter reports patient is doing well. She reports Aging Gracefully is actively involved and making needed repairs in home. Daughter reports a supportive family. Patient remains active with church activities and walks to her sister'ss house every day. She also reports patient's appetite is good and that she is maintaining her weight. She denies any questions or concerns. Patient Reported Symptoms:  Cognitive Cognitive Status: Alert and oriented to person, place, and time, Insightful and able to interpret abstract concepts, Normal speech and language skills (per daughter, patient alert, oriented, insightful and able to interpret abstract concepts; patient is very HOH and may appear as if paitient is not following due to patient did not hear what was being said.has captioned phone.)   Health Maintenance Behaviors: None Health Facilitated by: Healthy diet  Neurological Neurological Review of Symptoms: No symptoms reported     HEENT HEENT Symptoms Reported: No symptoms reported      Cardiovascular Cardiovascular Symptoms Reported: No symptoms reported Does patient have uncontrolled Hypertension?: No Cardiovascular Conditions: Hypertension, High blood cholesterol  Respiratory Respiratory Symptoms Reported: No symptoms reported    Endocrine Patient reports the following symptoms related to hypoglycemia or hyperglycemia : No symptoms reported Is patient diabetic?:  (diet controlled) Is patient checking blood sugars at home?: No Endocrine Conditions: Diabetes  Gastrointestinal Gastrointestinal Symptoms Reported: No symptoms reported Gastrointestinal Conditions: Irritable bowel syndrome Gastrointestinal Management Strategies: Medication therapy, Incontinence garment/pad, Diet modification, Coping strategies Gastrointestinal Self-Management Outcome: 4 (good)    Genitourinary Genitourinary Symptoms Reported: No symptoms reported Additional Genitourinary Details: pessary managed by Dr. Elijio Guadeloupe Genitourinary Management Strategies: Medical device, Incontinence garment/pad  Integumentary Integumentary Symptoms Reported: No symptoms reported    Musculoskeletal Musculoskelatal Symptoms Reviewed: No symptoms reported Additional Musculoskeletal Details: per daughter, patient feels like her balance can be better, but denies any specific concerns. patient walks down the street to her sister's house every day. And is going to take classes at the [facility] but does not need to use assistive device at this time.   Falls in the past year?: No    Psychosocial Psychosocial Symptoms Reported: No symptoms reported            10/12/2023    1:10 PM  Depression screen PHQ 2/9  Decreased Interest 0  Down, Depressed, Hopeless 0  PHQ - 2 Score 0    There were no vitals filed for this visit.  Medications Reviewed Today     Reviewed by Dajanique Robley M, RN (Registered Nurse) on  10/21/23 at 1152  Med List Status: <None>    Medication Order Taking? Sig Documenting Provider Last Dose Status Informant  acetaminophen  (TYLENOL ) 325 MG tablet [id number] Yes Take 325 mg by mouth every 6 (six) hours as needed for mild pain or moderate pain. Pain/headache [provider] Taking Active Multiple Informants  atorvastatin  (LIPITOR) 40 MG tablet [id number] Yes TAKE 1 TABLET(40 MG) BY MOUTH DAILY Arcadio Knuckles, MD Taking Active   cetirizine (ZYRTEC) 10 MG tablet [id number] Yes Take 10 mg by mouth daily. [provider] Taking Active Multiple Informants  dicyclomine  (BENTYL ) 10 MG capsule [id number] Yes Take 1 capsule (10 mg total) by mouth 3 (three) times daily before meals. Arcadio Knuckles, MD Taking Active   donepezil  (ARICEPT ) 5 MG tablet [id number] Yes TAKE 1 TABLET(5 MG) BY MOUTH AT BEDTIME Arcadio Knuckles, MD Taking Active   fluticasone  (FLONASE ) 50 MCG/ACT nasal spray [id number] Yes SHAKE LIQUID AND USE 2 SPRAYS IN EACH NOSTRIL AT BEDTIME Arcadio Knuckles, MD Taking Active            Med Note Lajuana Pilar, Jacky Massing   Fri Jun 18, 2023  4:09 PM) Uses as needed  losartan  (COZAAR ) 50 MG tablet [id number] Yes TAKE 1 TABLET(50 MG) BY MOUTH DAILY Arcadio Knuckles, MD Taking Active   mirtazapine  (REMERON ) 15 MG tablet [id number] No Take 1 tablet (15 mg total) by mouth at bedtime.  Patient not taking: Reported on 10/21/2023   Elois Hair, MD Not Taking Active   PROAIR  HFA 108 [phone] Base) MCG/ACT inhaler [id number] Yes INHALE 1 TO 2 PUFFS BY MOUTH EVERY 4 HOURS AS NEEDED FOR WHEEZE OR SHORTNESS OF Forbes Ida, MD Taking Active           Recommendation:   Patient to continue to follow up with providers as recommended/scheduled  Follow Up Plan:   Patient has met all care management goals. Care Management case will be closed. Patient has been provided contact information should new needs arise.   Lindi Revering, RN, MSN, BSN, CCM   [facility], Population Health Case  Manager Phone: [phone]

## 2023-10-21 NOTE — Patient Instructions (Signed)
 Visit Information  Thank you for taking time to visit with me today. Your Care Coordination goals have been met. If you have Care Coordination goals in the future, please contact your Primary care Provider.   Please call the Suicide and Crisis Lifeline: 988 call the [place]  National Suicide Prevention Lifeline: (867)357-2598 or TTY: 707-367-8116 TTY 937-207-4549) to talk to a trained counselor if you are experiencing a Mental Health or Behavioral Health Crisis or need someone to talk to.  Lindi Revering, RN, MSN, BSN, CCM [place]  [facility], Population Health Case Manager Phone: [phone]

## 2023-11-12 ENCOUNTER — Other Ambulatory Visit: Payer: Self-pay | Admitting: Rehabilitation

## 2023-11-13 NOTE — Patient Outreach (Signed)
 Aging Gracefully Program  OT Follow-Up Visit  11/13/2023  Nichole Silva 23-Jul-1934 [id number]  Visit:  2- Second Visit  Start Time:  1500 End Time:  1530 Total Minutes:  30  Readiness to Change Score :  Readiness to Change Score: 10   Patient Education: Education Provided: Yes Education Details: explain process of AG program, left car assist handle for trial. Discuss How to get up from a fall with her limitations. Person(s) Educated: Patient, Child(ren) Comprehension: Verbalized Understanding  Goals:   Goals Addressed             This Visit's Progress    AG OT Patient Stated   On track    Improve mobility in/out of the car 11/12/23: OT demonstrate and issues car assist handle for trial with friend's car. Will follow up next visit.        Post Clinical Reasoning: Client Action (Goal) Three Interventions: 11/12/23: demonstrate and explain car assist handle. OT left a demo for trial Did Client Try?: No Reason Client Did Not Try?: Other (will use with family friend car) Diplomatic Services operational officer Of Client Situation:: Client recognizes her limitations. Able to explain why it is hard to get up from a fall and knows to call fire dept. (used in the past). Is not confident about use of new shower, comfortable with current system for sef bathing. Daughter thinks she might be nervous about using the new shower. Client View Of His/Her Situation:: Client recognizes her age and knows limitations. Able to explain and ask questions. Willing to try the car assist handle and we discussed pros and cons Next Visit Plan:: review car assist handle. Awaiting construction for goals 2 and 3

## 2023-11-16 ENCOUNTER — Ambulatory Visit: Admitting: Cardiology

## 2023-11-18 ENCOUNTER — Other Ambulatory Visit: Payer: Self-pay | Admitting: *Deleted

## 2023-11-18 ENCOUNTER — Encounter: Payer: Self-pay | Admitting: *Deleted

## 2023-11-18 NOTE — Patient Outreach (Signed)
 Aging Gracefully Program  RN Visit  11/18/2023  Nichole Silva Sep 23, 1934 [id number]  Visit:  RN Visit Number: 2- Second Visit  RN TIME CALCULATION: Start TIme:  RN Start Time Calculation: 1400 End Time:  RN Stop Time Calculation: 1500 Total Minutes:  RN Time Calculation: 60  Readiness To Change Score:  Readiness to Change Score: 10  Universal RN Interventions: Calendar Distribution: No Exercise Review: Yes Medications: Yes Medication Changes: No Mood: Yes Pain: Yes PCP Advocacy/Support: No Fall Prevention: Yes Incontinence: Yes Clinician View Of Client Situation: Arrived for joint home visit. Nichole Silva answered door. Ambulating without assistive device. Home smells of cat urine. Cat asleep on side table. Living room area neat. Air conditioning unit in window. Small fan on floor with cord that has to be stepped over. Nichole Silva is hard of hearing. Client View Of His/Her Situation: Nichole Silva in good spirits. Denies any recent falls and rates pain 2 out of 10.  Healthcare Provider Communication: Did Surveyor, mining With [employer] Provider?: No Healthcare Provider Response According to RN: N/A According to Client, Did PCP Report Communication With An Aging Gracefully RN?: No Healthcare Provider Response According To Client: N/A  Clinician View of Client Situation: Clinician View Of Client Situation: Arrived for joint home visit. Nichole Silva answered door. Ambulating without assistive device. Home smells of cat urine. Cat asleep on side table. Living room area neat. Air conditioning unit in window. Small fan on floor with cord that has to be stepped over. Nichole Silva is hard of hearing. Client's View of His/Her Situation: Client View Of His/Her Situation: Nichole Silva in good spirits. Denies any recent falls and rates pain 2 out of 10.  Medication Assessment: Reviewed    OT Update: Pending [facility] contracts and assessments  Session Summary: Nichole Silva is engaging and  receptive during home visits. States she is eager to work on home exercises.    Goals Addressed               This Visit's Progress     AG RN (pt-stated)        10/12/23  Assessment: Nichole Silva resides with her [name] who is also a client of Aging Gracefully program. Nichole Silva reports she goes to church on Mondays and Wednesdays. States she walks to her sister's house up the street from her. Reports walking independently without device. Denies any current pain. Reports chronic pain to left elbow due to injury and elbow surgery many years ago. Reports feeling like her elbow makes her off balance. Reports pain level 0 out of 10 during visit. Nichole Silva has 2 cats in the home. Nichole Silva reports she has not had any recent falls.   Interventions: Provided chronic conditions booklet and writer's contact information. Discussed outpatient therapy for balance control. Nichole Silva reports she is not interested in outpatient therapy at this time. Discussed fall precautions and strategies.   Plan: Scheduled next home visit for June 26th at 2 pm.    CLIENT/RN ACTION PLAN - FALL PREVENTION  Registered Nurse:  Nichole Silva Date: 10/12/23  Client Name: Nichole Silva Client ID:    Target Area:  FALL PREVENTION    Why Problem May Occur: When off balance   Target Goal: No falls over the next 160 days   STRATEGIES Coping Strategies Ideas  Change Positions Slowly: lying to sitting, sitting to standing Reviewed 10/12/23 Reviewed 11/18/23 Changing positions can make people lightheaded. When getting up in the morning sit for a few  minutes, before standing.  Stand for a few minutes before walking and hold on to sturdy furniture or countertop.    Drink water Reviewed 10/12/23 Reviewed 11/18/23 Dehydration can make people dizzy. Ask your healthcare provider how much water you can drink. Decrease caffeine, caffeine makes you urinate a lot, which can make you dehydrated.   If you fall, tell  someone Reviewed 10/12/23 Reviewed 11/18/23 Tell a friend or family member even if you didn't get hurt. Tell your healthcare provider if you fall.  They can help you figure out why.   Get your vision and hearing checked Reviewed 10/12/23 Reviewed 11/18/23 Poor vision and hearing loss can make people fall. Glaucoma and diabetes can cause poor vision.   Other    Prevention Ideas  Review your Medicines Reviewed 10/12/23 Reviewed 11/18/23 Many medicines can make you dizzy or sleepy and increase your risk of falling. Your Aging Gracefully Nurse will look at your medications and see if you are taking any that might cause falling.   Activity and Exercise Reviewed 10/12/23 Reviewed 11/18/23- Aging Gracefully exercises demonstrated Aging Gracefully exercises Walking (inside or outside) [employer]:  cooking, Education officer, environmental, laundry Exercise while watching TV Swimming or water aerobics.   Strengthen Bones Reviewed 10/12/23 Reviewed 11/18/23 Exercise makes your bones stronger. Vitamin D  and Calcium  make your bones stronger.  Ask your Healthcare provider if you should be taking them. Your body makes vitamin D  from the sun.  Sit in the sun for 10 minutes every day (without sunscreen).   Control your urine  Reviewed 10/12/23 Reviewed 11/18/23 Prevent constipation Cut back on caffeinated drinks. Don't wait to urinate. If diabetic, control your blood sugar. Ask your Aging Gracefully Nurse and Healthcare Provider  about urine control.   Control your blood sugar (if you are diabetic) High blood sugar can cause frequent urination, poor vision, and numbness in your feet, which can make you fall.   Low blood sugar can cause confusion and dizziness.   Other coping strategies 1. 2. 3. 4. 5.   PRACTICE It is important to practice the strategies so we can determine if they will be effective in helping to reach the goal.    Follow these specific recommendations:        If strategy does  not work the first time, try it again.     We may make some changes over the next few sessions.       Nichole Hurst, MSN, RN, BSN [facility], Healthy Communities RN Case Manager for Aging Gracefully Direct Dial: [phone]     11/18/23  Assessment: Mrs. Ogburn reports doing well during these hot days. States she stays indoors most of the time and has not been going out much except for MD appointment and church. Denies fany recent falls. Reports she continues to have chronic elbow pain from an elbow injury and surgery over 40 years ago. Endorses taking Tylenol , uses OTC pain patch, and elevation of left arm elbow with relief. Does not use assistive device but endorses walking and changing positions slowly.   Interventions:  Demonstrated Aging Gracefully home exercises. Mrs. Cumberland performed return demonstration. Provided Aging Gracefully exercise booklet. Discussed fall precautions. Encouraged Mrs. Albergo to watch her steps around cats and cords. Encouraged Mrs. Malanowski to request home health referral from PCP. Encouraged Mrs. Guin and [name] to perform home exercises together.   Plan: Scheduled next home visit for July 24 at 3 pm.   Nichole Hurst, MSN, RN,  BSN [employer]  [facility], Healthy Communities RN Case Manager for Aging Gracefully Direct Dial: [phone]

## 2023-11-18 NOTE — Patient Instructions (Signed)
 Visit Information  Thank you for taking time to visit with me today. Please don't hesitate to contact me if I can be of assistance to you before our next scheduled home appointment.  Following are the goals we discussed today:   Goals Addressed               This Visit's Progress     AG RN (pt-stated)        10/12/23  Assessment: Nichole Silva resides with her [name] who is also a client of Aging Gracefully program. Nichole Silva reports she goes to church on Mondays and Wednesdays. States she walks to her sister's house up the street from her. Reports walking independently without device. Denies any current pain. Reports chronic pain to left elbow due to injury and elbow surgery many years ago. Reports feeling like her elbow makes her off balance. Reports pain level 0 out of 10 during visit. Nichole Silva has 2 cats in the home. Nichole Silva reports she has not had any recent falls.   Interventions: Provided chronic conditions booklet and writer's contact information. Discussed outpatient therapy for balance control. Nichole Silva reports she is not interested in outpatient therapy at this time. Discussed fall precautions and strategies.   Plan: Scheduled next home visit for June 26th at 2 pm.    CLIENT/RN ACTION PLAN - FALL PREVENTION  Registered Nurse:  [name] Date: 10/12/23  Client Name: Nichole Silva Client ID:    Target Area:  FALL PREVENTION    Why Problem May Occur: When off balance   Target Goal: No falls over the next 160 days   STRATEGIES Coping Strategies Ideas  Change Positions Slowly: lying to sitting, sitting to standing Reviewed 10/12/23 Reviewed 11/18/23 Changing positions can make people lightheaded. When getting up in the morning sit for a few minutes, before standing.  Stand for a few minutes before walking and hold on to sturdy furniture or countertop.    Drink water Reviewed 10/12/23 Reviewed 11/18/23 Dehydration can make people dizzy. Ask your  healthcare provider how much water you can drink. Decrease caffeine, caffeine makes you urinate a lot, which can make you dehydrated.   If you fall, tell someone Reviewed 10/12/23 Reviewed 11/18/23 Tell a friend or family member even if you didn't get hurt. Tell your healthcare provider if you fall.  They can help you figure out why.   Get your vision and hearing checked Reviewed 10/12/23 Reviewed 11/18/23 Poor vision and hearing loss can make people fall. Glaucoma and diabetes can cause poor vision.   Other    Prevention Ideas  Review your Medicines Reviewed 10/12/23 Reviewed 11/18/23 Many medicines can make you dizzy or sleepy and increase your risk of falling. Your Aging Gracefully Nurse will look at your medications and see if you are taking any that might cause falling.   Activity and Exercise Reviewed 10/12/23 Reviewed 11/18/23- Aging Gracefully exercises demonstrated Aging Gracefully exercises Walking (inside or outside) [employer]:  cooking, Education officer, environmental, laundry Exercise while watching TV Swimming or water aerobics.   Strengthen Bones Reviewed 10/12/23 Reviewed 11/18/23 Exercise makes your bones stronger. Vitamin D  and Calcium  make your bones stronger.  Ask your Healthcare provider if you should be taking them. Your body makes vitamin D  from the sun.  Sit in the sun for 10 minutes every day (without sunscreen).   Control your urine  Reviewed 10/12/23 Reviewed 11/18/23 Prevent constipation Cut back on caffeinated drinks. Don't wait to urinate. If diabetic, control your  blood sugar. Ask your Aging Gracefully Nurse and Healthcare Provider  about urine control.   Control your blood sugar (if you are diabetic) High blood sugar can cause frequent urination, poor vision, and numbness in your feet, which can make you fall.   Low blood sugar can cause confusion and dizziness.   Other coping strategies 1. 2. 3. 4. 5.   PRACTICE It is important to practice the  strategies so we can determine if they will be effective in helping to reach the goal.    Follow these specific recommendations:        If strategy does not work the first time, try it again.     We may make some changes over the next few sessions.       [name], MSN, RN, BSN [facility], Healthy Communities RN Case Manager for Aging Gracefully Direct Dial: [phone]     11/18/23  Assessment: Nichole Silva reports doing well during these hot days. States she stays indoors most of the time and has not been going out much except for MD appointment and church. Denies fany recent falls. Reports she continues to have chronic elbow pain from an elbow injury and surgery over 40 years ago. Endorses taking Tylenol , uses OTC pain patch, and elevation of left arm elbow with relief. Does not use assistive device but endorses walking and changing positions slowly.   Interventions:  Demonstrated Aging Gracefully home exercises. Nichole Silva performed return demonstration. Provided Aging Gracefully exercise booklet. Discussed fall precautions. Encouraged Nichole Silva to watch her steps around cats and cords. Encouraged Nichole Silva to request home health referral from PCP. Encouraged Nichole Silva and [name] to perform home exercises together.   Plan: Scheduled next home visit for July 24 at 3 pm.   [name], MSN, RN, BSN [place]  [facility], Healthy Communities RN Case Manager for Aging Gracefully Direct Dial: [phone]                                                                Our next appointment is on July 24 at 3 pm.  If you are experiencing a Mental Health or Behavioral Health Crisis or need someone to talk to, please call the Suicide and Crisis Lifeline: 988 call the [place]  National Suicide Prevention Lifeline: 409-519-5780 or TTY: 671-819-8824 TTY 262-050-2493) to talk to a  trained counselor call 1-800-273-TALK (toll free, 24 hour hotline) go to [facility] [address], [place] [phone]) call 911   The patient verbalized understanding of instructions, educational materials, and care plan provided today and agreed to receive a mailed copy of patient instructions, educational materials, and care plan.   [name], MSN, RN, BSN [facility], Healthy Communities RN Case Manager for Aging Gracefully Direct Dial: [phone]

## 2023-11-19 ENCOUNTER — Other Ambulatory Visit: Payer: Self-pay | Admitting: *Deleted

## 2023-11-19 NOTE — Patient Outreach (Signed)
 Aging Gracefully Program  11/19/2023  Nichole Silva 1934-10-27 [id number]  Made aware by [facility] care management office that Nathanel (daughter) called office to inquire about writer's AG home visits. Nathanel stated Mrs. Devlin and daughter [name] are confused as to why AG RN is making visits before [employer] have began home modifications and that [employer] visits are redundant.   Telephone call made to Nathanel to explain AG program and that [employer] RN's visits may not coincide when [employer] begins home modification work, Recruitment consultant, and the like. Explained AG RN does not interfere or replace [facility] CCM services. Mrs. Vankuren was active with [facility] CCM prior to AG RN involvement. Extensive conversation with Nathanel about [employer] RN role. Nathanel expressed understanding and agrees with ongoing services with AG program. Nathanel states Ardelle and daughter Paula's focus is with [employer] work. Myles Nathanel that they are welcome to speak with [facility] if they chose not to participate Aging Gracefully but maybe another program with [employer]. Nathanel reports they are supposed to sign contracts with [facility] today.   Provided Nathanel with writer's contact information. Discussed next home visit is scheduled for July 24th at 3 pm. Nathanel requests August appointment be scheduled as well. Therefore, August home visit scheduled for August 14th at 3 pm.  Myles Nathanel to Higher education careers adviser with further questions or concerns. Will update [facility] and AG OT of writer's conversation with Nathanel today.   [name], MSN, RN, BSN [place]  [facility], Healthy Communities RN Case Manager for Aging Gracefully Direct Dial: [phone]

## 2023-11-22 ENCOUNTER — Other Ambulatory Visit: Payer: Self-pay | Admitting: Internal Medicine

## 2023-11-22 DIAGNOSIS — I10 Essential (primary) hypertension: Secondary | ICD-10-CM

## 2023-11-30 ENCOUNTER — Other Ambulatory Visit: Payer: Self-pay | Admitting: Internal Medicine

## 2023-11-30 DIAGNOSIS — E785 Hyperlipidemia, unspecified: Secondary | ICD-10-CM

## 2023-12-16 ENCOUNTER — Encounter: Payer: Self-pay | Admitting: *Deleted

## 2023-12-21 ENCOUNTER — Ambulatory Visit: Admitting: Obstetrics and Gynecology

## 2023-12-22 ENCOUNTER — Other Ambulatory Visit: Payer: Self-pay | Admitting: Internal Medicine

## 2023-12-22 ENCOUNTER — Encounter: Payer: Self-pay | Admitting: *Deleted

## 2023-12-22 ENCOUNTER — Other Ambulatory Visit: Payer: Self-pay | Admitting: *Deleted

## 2023-12-22 DIAGNOSIS — F0390 Unspecified dementia without behavioral disturbance: Secondary | ICD-10-CM

## 2023-12-22 NOTE — Patient Outreach (Signed)
 Aging Gracefully Program  RN Visit  12/22/2023  Nichole Silva Sep 07, 1934 [id number]  Visit:  RN Visit Number: 3- Third Visit  RN TIME CALCULATION: Start TIme:  RN Start Time Calculation: 1500 End Time:  RN Stop Time Calculation: 1530 Total Minutes:  RN Time Calculation: 30  Readiness To Change Score:  Readiness to Change Score: 10  Universal RN Interventions: Calendar Distribution: No Exercise Review: Yes Medications: Yes Medication Changes: No Mood: Yes Pain: Yes PCP Advocacy/Support: No Fall Prevention: Yes Incontinence: Yes Clinician View Of Client Situation: Arrived for joint home visit. Nichole Silva in living room waiting for Clinical research associate. CHS currently working on [employer]. Ambulates without assitive device Client View Of His/Her Situation: Nichole Silva denies any concerns. Looking forward to [facility]' competion of the home repairs.  Healthcare Provider Communication: Did Surveyor, mining With [employer] Provider?: No Healthcare Provider Response According to RN: N/A According to Client, Did PCP Report Communication With An Aging Gracefully RN?: No Healthcare Provider Response According To Client: n/a  Clinician View of Client Situation: Clinician View Of Client Situation: Arrived for joint home visit. Nichole Silva in living room waiting for Clinical research associate. CHS currently working on [employer]. Ambulates without assitive device Client's View of His/Her Situation: Client View Of His/Her Situation: Nichole Silva denies any concerns. Looking forward to [facility]' competion of the home repairs.  Medication Assessment: reviewed    OT Update: Pending CHS completion  Session Summary: CHS is currently working on home repairs. Nichole Silva denies any concerns.   Goals Addressed               This Visit's Progress     AG RN (pt-stated)        10/12/23  Assessment: Nichole Silva resides with her [name] who is also a client of Aging Gracefully program. Nichole Silva reports she goes to  church on Mondays and Wednesdays. States she walks to her sister's house up the street from her. Reports walking independently without device. Denies any current pain. Reports chronic pain to left elbow due to injury and elbow surgery many years ago. Reports feeling like her elbow makes her off balance. Reports pain level 0 out of 10 during visit. Nichole Silva has 2 cats in the home. Nichole Silva reports she has not had any recent falls.   Interventions: Provided chronic conditions booklet and writer's contact information. Discussed outpatient therapy for balance control. Nichole Silva reports she is not interested in outpatient therapy at this time. Discussed fall precautions and strategies.   Plan: Scheduled next home visit for June 26th at 2 pm.    CLIENT/RN ACTION PLAN - FALL PREVENTION  Registered Nurse:  Nichole Silva Date: 10/12/23  Client Name: Nichole Silva Client ID:    Target Area:  FALL PREVENTION    Why Problem May Occur: When off balance   Target Goal: No falls over the next 160 days   STRATEGIES Coping Strategies Ideas  Change Positions Slowly: lying to sitting, sitting to standing Reviewed 10/12/23 Reviewed 11/18/23  Changing positions can make people lightheaded. When getting up in the morning sit for a few minutes, before standing.  Stand for a few minutes before walking and hold on to sturdy furniture or countertop.    Drink water Reviewed 10/12/23 Reviewed 11/18/23 Reviewed 12/22/23 Dehydration can make people dizzy. Ask your healthcare provider how much water you can drink. Decrease caffeine, caffeine makes you urinate a lot, which can make you dehydrated.   If you fall, tell someone Reviewed  10/12/23 Reviewed 11/18/23 Tell a friend or family member even if you didn't get hurt. Tell your healthcare provider if you fall.  They can help you figure out why.   Get your vision and hearing checked Reviewed 10/12/23 Reviewed 11/18/23 Poor vision and hearing loss can make people  fall. Glaucoma and diabetes can cause poor vision.   Other    Prevention Ideas  Review your Medicines Reviewed 10/12/23 Reviewed 11/18/23 Many medicines can make you dizzy or sleepy and increase your risk of falling. Your Aging Gracefully Nurse will look at your medications and see if you are taking any that might cause falling.   Activity and Exercise Reviewed 10/12/23 Reviewed 11/18/23- Aging Gracefully exercises demonstrated Reviewed 12/22/23 Aging Gracefully exercises Walking (inside or outside) [employer]:  cooking, Education officer, environmental, laundry Exercise while watching TV Swimming or water aerobics.   Strengthen Bones Reviewed 10/12/23 Reviewed 11/18/23 Exercise makes your bones stronger. Vitamin D  and Calcium  make your bones stronger.  Ask your Healthcare provider if you should be taking them. Your body makes vitamin D  from the sun.  Sit in the sun for 10 minutes every day (without sunscreen).   Control your urine  Reviewed 10/12/23 Reviewed 11/18/23 Prevent constipation Cut back on caffeinated drinks. Don't wait to urinate. If diabetic, control your blood sugar. Ask your Aging Gracefully Nurse and Healthcare Provider  about urine control.   Control your blood sugar (if you are diabetic) High blood sugar can cause frequent urination, poor vision, and numbness in your feet, which can make you fall.   Low blood sugar can cause confusion and dizziness.   Other coping strategies 1. 2. 3. 4. 5.   PRACTICE It is important to practice the strategies so we can determine if they will be effective in helping to reach the goal.    Follow these specific recommendations:        If strategy does not work the first time, try it again.     We may make some changes over the next few sessions.       Nichole Hurst, MSN, RN, BSN [facility], Healthy Communities RN Case Manager for Aging Gracefully Direct Dial: [phone]      11/18/23  Assessment: Nichole Silva reports doing well during these hot days. States she stays indoors most of the time and has not been going out much except for MD appointment and church. Denies fany recent falls. Reports she continues to have chronic elbow pain from an elbow injury and surgery over 40 years ago. Endorses taking Tylenol , uses OTC pain patch, and elevation of left arm elbow with relief. Does not use assistive device but endorses walking and changing positions slowly.   Interventions:  Demonstrated Aging Gracefully home exercises. Mrs. Hubbs performed return demonstration. Provided Aging Gracefully exercise booklet. Discussed fall precautions. Encouraged Mrs. Martinovich to watch her steps around cats and cords. Encouraged Mrs. Sherley to request home health referral from PCP. Encouraged Mrs. Fulmore and [name] to perform home exercises together.   Plan: Scheduled next home visit for July 24 at 3 pm.   Nichole Hurst, MSN, RN, BSN [facility], Healthy Communities RN Case Manager for Aging Gracefully Direct Dial: [phone]         12/22/23  Assessment: Mrs. Akers reports she is doing well. Looking forward in going to the local [employer] for dancing classes for seniors. Nichole Silva (daughter) reports Mrs. Kanaan performs home exercises regularly.  Interventions: Complimented Mrs.  Rosamilia in staying active and working on home exercises. Encouraged water intake during these hot weather days.  Plan: Scheduled last home visit for 01/06/24  Nichole Hurst, MSN, RN, BSN [place]  [facility], Healthy Communities RN Case Manager for Aging Gracefully Direct Dial: [phone]

## 2023-12-22 NOTE — Patient Instructions (Signed)
 Visit Information  Thank you for taking time to visit with me today. Please don't hesitate to contact me if I can be of assistance to you before our next scheduled home appointment.  Following are the goals we discussed today:   Goals Addressed               This Visit's Progress     AG RN (pt-stated)        10/12/23  Assessment: Nichole Silva resides with her [name] who is also a client of Aging Gracefully program. Nichole Silva reports she goes to church on Mondays and Wednesdays. States she walks to her sister's house up the street from her. Reports walking independently without device. Denies any current pain. Reports chronic pain to left elbow due to injury and elbow surgery many years ago. Reports feeling like her elbow makes her off balance. Reports pain level 0 out of 10 during visit. Nichole Silva has 2 cats in the home. Nichole Silva reports she has not had any recent falls.   Interventions: Provided chronic conditions booklet and writer's contact information. Discussed outpatient therapy for balance control. Nichole Silva reports she is not interested in outpatient therapy at this time. Discussed fall precautions and strategies.   Plan: Scheduled next home visit for June 26th at 2 pm.    CLIENT/RN ACTION PLAN - FALL PREVENTION  Registered Nurse:  [name] Date: 10/12/23  Client Name: Nichole Silva Client ID:    Target Area:  FALL PREVENTION    Why Problem May Occur: When off balance   Target Goal: No falls over the next 160 days   STRATEGIES Coping Strategies Ideas  Change Positions Slowly: lying to sitting, sitting to standing Reviewed 10/12/23 Reviewed 11/18/23  Changing positions can make people lightheaded. When getting up in the morning sit for a few minutes, before standing.  Stand for a few minutes before walking and hold on to sturdy furniture or countertop.    Drink water Reviewed 10/12/23 Reviewed 11/18/23 Reviewed 12/22/23 Dehydration can make people  dizzy. Ask your healthcare provider how much water you can drink. Decrease caffeine, caffeine makes you urinate a lot, which can make you dehydrated.   If you fall, tell someone Reviewed 10/12/23 Reviewed 11/18/23 Tell a friend or family member even if you didn't get hurt. Tell your healthcare provider if you fall.  They can help you figure out why.   Get your vision and hearing checked Reviewed 10/12/23 Reviewed 11/18/23 Poor vision and hearing loss can make people fall. Glaucoma and diabetes can cause poor vision.   Other    Prevention Ideas  Review your Medicines Reviewed 10/12/23 Reviewed 11/18/23 Many medicines can make you dizzy or sleepy and increase your risk of falling. Your Aging Gracefully Nurse will look at your medications and see if you are taking any that might cause falling.   Activity and Exercise Reviewed 10/12/23 Reviewed 11/18/23- Aging Gracefully exercises demonstrated Reviewed 12/22/23 Aging Gracefully exercises Walking (inside or outside) [employer]:  cooking, Education officer, environmental, laundry Exercise while watching TV Swimming or water aerobics.   Strengthen Bones Reviewed 10/12/23 Reviewed 11/18/23 Exercise makes your bones stronger. Vitamin D  and Calcium  make your bones stronger.  Ask your Healthcare provider if you should be taking them. Your body makes vitamin D  from the sun.  Sit in the sun for 10 minutes every day (without sunscreen).   Control your urine  Reviewed 10/12/23 Reviewed 11/18/23 Prevent constipation Cut back on caffeinated drinks. Don't wait to  urinate. If diabetic, control your blood sugar. Ask your Aging Gracefully Nurse and Healthcare Provider  about urine control.   Control your blood sugar (if you are diabetic) High blood sugar can cause frequent urination, poor vision, and numbness in your feet, which can make you fall.   Low blood sugar can cause confusion and dizziness.   Other coping strategies 1. 2. 3. 4. 5.    PRACTICE It is important to practice the strategies so we can determine if they will be effective in helping to reach the goal.    Follow these specific recommendations:        If strategy does not work the first time, try it again.     We may make some changes over the next few sessions.       [name], MSN, RN, BSN [facility], Healthy Communities RN Case Manager for Aging Gracefully Direct Dial: [phone]     11/18/23  Assessment: Nichole Silva reports doing well during these hot days. States she stays indoors most of the time and has not been going out much except for MD appointment and church. Denies fany recent falls. Reports she continues to have chronic elbow pain from an elbow injury and surgery over 40 years ago. Endorses taking Tylenol , uses OTC pain patch, and elevation of left arm elbow with relief. Does not use assistive device but endorses walking and changing positions slowly.   Interventions:  Demonstrated Aging Gracefully home exercises. Nichole Silva performed return demonstration. Provided Aging Gracefully exercise booklet. Discussed fall precautions. Encouraged Nichole Silva to watch her steps around cats and cords. Encouraged Nichole Silva to request home health referral from PCP. Encouraged Nichole Silva and [name] to perform home exercises together.   Plan: Scheduled next home visit for July 24 at 3 pm.   [name], MSN, RN, BSN [facility] [place], Healthy Communities RN Case Manager for Aging Gracefully Direct Dial: [phone]         12/22/23  Assessment: Nichole Silva reports she is doing well. Looking forward in going to the local [employer] for dancing classes for seniors. Nichole Silva (daughter) reports Nichole Silva performs home exercises regularly.  Interventions: Complimented Nichole Silva in staying active and working on home exercises. Encouraged water intake during these hot weather days.  Plan:  Scheduled last home visit for 01/06/24  [name], MSN, RN, BSN [place]  [facility], Healthy Communities RN Case Manager for Aging Gracefully Direct Dial: [phone]                                                                               Our next appointment is on August 14 at 3pm.  If you are experiencing a Mental Health or Behavioral Health Crisis or need someone to talk to, please call the Suicide and Crisis Lifeline: 988 call the [place]  National Suicide Prevention Lifeline: (365)470-6158 or TTY: 8075452769 TTY (419) 636-6962) to talk to a trained counselor call 1-800-273-TALK (toll free, 24 hour hotline) go to [facility] [address], [place] [phone]) call 911   The patient verbalized understanding of instructions, educational materials, and care plan provided today  and agreed to receive a mailed copy of patient instructions, educational materials, and care plan.    [name], MSN, RN, BSN [place]  [facility], Healthy Communities RN Case Manager for Aging Gracefully Direct Dial: [phone]

## 2023-12-24 ENCOUNTER — Other Ambulatory Visit: Payer: Self-pay | Admitting: Rehabilitation

## 2023-12-25 NOTE — Patient Outreach (Signed)
 Aging Gracefully Program  OT Follow-Up Visit  12/25/2023  Nichole Silva 05-Sep-1934 [id number]  Visit:  3- Third Visit  Start Time:  1515 End Time:  1545 Total Minutes:  30  Readiness to Change Score :  Readiness to Change Score: 10   Durable Medical Equipment:  Using shower seat and grab bars in new walk in shower. Raised toilet seat installed  Patient Education: Education Provided: Yes Education Details: Will order handrail for the toilet seat. Reviewed use of shower seat and grab bars for safe enter/exit Person(s) Educated: Patient, Child(ren) Kandra) Comprehension: Verbalized Understanding, Returned Demonstration  Goals:   Goals Addressed             This Visit's Progress    AG OT Patient Stated       Improve safety and mobility in the bathroom 12/24/23: practice stepping into the shower using the grab bars, adjust shower seat height. Needs a resting place for the hand held shower, OT will address with CHS. Raised toilet seat is also completed, but client is missing her hand rails. OT to order handrails.    OT ACTION PLAN: Bathing  Target Problem Area:  Safety in the shower   Why Problem May Occur:     New shower  Learning to manage shower seat, grab bars and HHS  Arm weakness     Target Goal(s):  Improve safety and mobility in the bathroom    STRATEGIES   Saving Your Energy DO:  Use a tub seat/adjusted for height today   Use a long-handled sponge- has one needs to be placed in the shower  Keep frequently used items within easy reach  (Other):use grab bars for balance and safety   (Other):   (Other):     Modifying your home environment and making it safe     DO:  Use grab bars in the shower and next to the toilet (OT to order the toilet handrail)  Do not need a rubber mat, the shower pan is non skid surface.  Make sure the bathroom is well-lit; use all lighting  (Other): pause and take time transition in and out of shower.   (Other):Consider  family member to assist as getting used to the new shower   (Other):    Simplifying the way you set up tasks or daily routines DO:  Plan to bathe/shower before you're overly tired  Gather everything you will need before beginning   (Other):    PRACTICE  Based on what we have talked about, you are willing to try:  ___shower seat in the new walk in shower  __grab bars: hold on to each one as stepping in and out of the shower.   If an idea does not work the first time, try it again (and again).  We may make some changes over the next few sessions, based on how they work.         Post Clinical Reasoning: Client Action (Goal) One Interventions: 12/24/23: practice stepping into the shower using the grab bars, adjust shower seat height. Needs a resting place for the hand held shower, OT will address with CHS. Raised toilet seat is also completed, but client is missing her hand rails. OT to order handrails. Did Client Try?: Yes Targeted Problem Area Status: A Lot Better Client Action (Goal) Three Interventions: 12/24/23: not tried Did Client Try?: No Reason Client Did Not Try?:  (daughter to help with trial, may not need. Reports easier time with sit to stand) Clinician  View Of Client Situation:: Client is satisfied with the shower. Willing to practice stepping in and out. Safe us  of the grab bars and shower seat. Car assist handle may not be needed, daughter to try to remeber to help trial. Client View Of His/Her Situation:: Client able to identify all the current home projects. Client reports the tall toilet is good, but she is missing the handrails. OT explained this can be added. Next Visit Plan:: F/U addition of toilet handrail, HHS attachment, review all goals

## 2024-01-05 DIAGNOSIS — Z4689 Encounter for fitting and adjustment of other specified devices: Secondary | ICD-10-CM | POA: Diagnosis not present

## 2024-01-06 ENCOUNTER — Other Ambulatory Visit: Payer: Self-pay | Admitting: *Deleted

## 2024-01-07 ENCOUNTER — Encounter: Payer: Self-pay | Admitting: *Deleted

## 2024-01-07 NOTE — Patient Instructions (Signed)
 Visit Information  Thank you for taking time to visit with me today. Please don't hesitate to contact me if I can be of assistance to you before our next scheduled home appointment.  Following are the goals we discussed today:   Goals Addressed               This Visit's Progress     COMPLETED: AG RN (pt-stated)        10/12/23  Assessment: Mrs. Gaugh resides with her daughter [name] who is also a client of Aging Gracefully program. Mrs. Weedman reports she goes to church on Mondays and Wednesdays. States she walks to her sister's house up the street from her. Reports walking independently without device. Denies any current pain. Reports chronic pain to left elbow due to injury and elbow surgery many years ago. Reports feeling like her elbow makes her off balance. Reports pain level 0 out of 10 during visit. Mrs. Gotsch has 2 cats in the home. Mrs. Fix reports she has not had any recent falls.   Interventions: Provided chronic conditions booklet and writer's contact information. Discussed outpatient therapy for balance control. Mrs. Shambaugh reports she is not interested in outpatient therapy at this time. Discussed fall precautions and strategies.   Plan: Scheduled next home visit for June 26th at 2 pm.    CLIENT/RN ACTION PLAN - FALL PREVENTION  Registered Nurse:  [name] Date: 10/12/23  Client Name: Gaylan Richelle Client ID:    Target Area:  FALL PREVENTION    Why Problem May Occur: When off balance   Target Goal: No falls over the next 160 days   STRATEGIES Coping Strategies Ideas  Change Positions Slowly: lying to sitting, sitting to standing Reviewed 10/12/23 Reviewed 11/18/23  Changing positions can make people lightheaded. When getting up in the morning sit for a few minutes, before standing.  Stand for a few minutes before walking and hold on to sturdy furniture or countertop.    Drink water Reviewed 10/12/23 Reviewed 11/18/23 Reviewed 12/22/23 Dehydration can make  people dizzy. Ask your healthcare provider how much water you can drink. Decrease caffeine, caffeine makes you urinate a lot, which can make you dehydrated.   If you fall, tell someone Reviewed 10/12/23 Reviewed 11/18/23 Tell a friend or family member even if you didn't get hurt. Tell your healthcare provider if you fall.  They can help you figure out why.   Get your vision and hearing checked Reviewed 10/12/23 Reviewed 11/18/23 Poor vision and hearing loss can make people fall. Glaucoma and diabetes can cause poor vision.   Other    Prevention Ideas  Review your Medicines Reviewed 10/12/23 Reviewed 11/18/23 Many medicines can make you dizzy or sleepy and increase your risk of falling. Your Aging Gracefully Nurse will look at your medications and see if you are taking any that might cause falling.   Activity and Exercise Reviewed 10/12/23 Reviewed 11/18/23- Aging Gracefully exercises demonstrated Reviewed 12/22/23 Aging Gracefully exercises Walking (inside or outside) [employer]:  cooking, Education officer, environmental, laundry Exercise while watching TV Swimming or water aerobics.   Strengthen Bones Reviewed 10/12/23 Reviewed 11/18/23 Exercise makes your bones stronger. Vitamin D  and Calcium  make your bones stronger.  Ask your Healthcare provider if you should be taking them. Your body makes vitamin D  from the sun.  Sit in the sun for 10 minutes every day (without sunscreen).   Control your urine  Reviewed 10/12/23 Reviewed 11/18/23 Prevent constipation Cut back on caffeinated drinks. Don't wait  to urinate. If diabetic, control your blood sugar. Ask your Aging Gracefully Nurse and Healthcare Provider  about urine control.   Control your blood sugar (if you are diabetic) High blood sugar can cause frequent urination, poor vision, and numbness in your feet, which can make you fall.   Low blood sugar can cause confusion and dizziness.   Other coping strategies 1. 2. 3. 4. 5.    PRACTICE It is important to practice the strategies so we can determine if they will be effective in helping to reach the goal.    Follow these specific recommendations:        If strategy does not work the first time, try it again.     We may make some changes over the next few sessions.       [name], MSN, RN, BSN [facility], Healthy Communities RN Case Manager for Aging Gracefully Direct Dial: [phone]     11/18/23  Assessment: Mrs. Homeyer reports doing well during these hot days. States she stays indoors most of the time and has not been going out much except for MD appointment and church. Denies fany recent falls. Reports she continues to have chronic elbow pain from an elbow injury and surgery over 40 years ago. Endorses taking Tylenol , uses OTC pain patch, and elevation of left arm elbow with relief. Does not use assistive device but endorses walking and changing positions slowly.   Interventions:  Demonstrated Aging Gracefully home exercises. Mrs. Murchison performed return demonstration. Provided Aging Gracefully exercise booklet. Discussed fall precautions. Encouraged Mrs. Werntz to watch her steps around cats and cords. Encouraged Mrs. Sawdey to request home health referral from PCP. Encouraged Mrs. Nebel and daughter [name] to perform home exercises together.   Plan: Scheduled next home visit for July 24 at 3 pm.   [name], MSN, RN, BSN [facility], Healthy Communities RN Case Manager for Aging Gracefully Direct Dial: [phone]     12/22/23  Assessment: Mrs. Jamroz reports she is doing well. Looking forward in going to the local [employer] for dancing classes for seniors. [name] (daughter) reports Mrs. Glanz performs home exercises regularly.  Interventions: Complimented Mrs. Coggins in staying active and working on home exercises. Encouraged water intake during these hot weather days.  Plan: Scheduled  last home visit for 01/06/24  [name], MSN, RN, BSN [place]  [facility], Healthy Communities RN Case Manager for Aging Gracefully Direct Dial: [phone]    01/06/24  Assessment: Mrs. Tootle denies pain or falls. States she continues to be active. States she has been staying home a little more lately since [employer] has been doing repairs to the home. Daughter Nathanel present during visit.  Interventions: Encouraged to continue home exercises. Discussed this is writer's last home visit.  Plan: Will alert AG team of writer's last visit.  Fall Prevention goal: Met  [name], MSN, RN, BSN [place]  [facility], Healthy Communities RN Case Manager for Aging Gracefully Direct Dial: [phone]  If you are experiencing a Mental Health or Behavioral Health Crisis or need someone to talk to, please call the Suicide and Crisis Lifeline: 988 call the [place]  National Suicide Prevention Lifeline: (845) 338-7730 or TTY: 681-441-6663 TTY 562-391-3240) to talk to a trained counselor call 1-800-273-TALK (toll free, 24 hour hotline) go to [facility] [address], [place] [phone]) call 911   The patient verbalized understanding of instructions, educational materials, and care plan provided today and agreed to receive a mailed copy of patient instructions, educational materials, and care plan.   [name], MSN, RN, BSN [place]  [facility], Healthy Communities RN Case Manager for Aging Gracefully Direct Dial: [phone]

## 2024-01-07 NOTE — Patient Outreach (Signed)
 Aging Gracefully Program  RN Visit  01/07/2024  Nichole Silva Jul 03, 1934 [id number]  Visit:  RN Visit Number: 4- Fourth Visit  RN TIME CALCULATION: Start TIme:  RN Start Time Calculation: 1500 End Time:  RN Stop Time Calculation: 1530 Total Minutes:  RN Time Calculation: 30  Readiness To Change Score:  Readiness to Change Score: 9.67  Universal RN Interventions: Calendar Distribution: No Exercise Review: Yes Medications: Yes Medication Changes: No Mood: Yes Pain: Yes PCP Advocacy/Support: No Fall Prevention: Yes Incontinence: Yes Clinician View Of Client Situation: Arrived for joint home visit. [name] (primary contact) present during. Living room area neat. Cats present. Client View Of His/Her Situation: Ms. Moulin states she has been doing well. Nichole Silva reports more repairs are needed be Silva with [facility]. Nichole Silva states she is not sure why AG RN visits were needed. This was explained once again and discussed this is writer's last visit.  Healthcare Provider Communication: Did Surveyor, mining With [employer] Provider?: No Healthcare Provider Response According to RN: N/A According to Client, Did PCP Report Communication With An Aging Gracefully RN?: No Healthcare Provider Response According To Client: N/A  Clinician View of Client Situation: Clinician View Of Client Situation: Arrived for joint home visit. [name] (primary contact) present during. Living room area neat. Cats present. Client's View of His/Her Situation: Client View Of His/Her Situation: Ms. Else states she has been doing well. Nichole Silva reports more repairs are needed be Silva with [facility]. Nichole Silva states she is not sure why AG RN visits were needed. This was explained once again and discussed this is writer's last visit.  Medication Assessment: reviewed    OT Update: Pending [facility] completion.  Session Summary: [name] reports she is not sure why AG RN nurse needed to do home visits.  Writer  explained again. Of note, Nichole Silva and Mrs. Borrelli were given opportunity to opt out of Aging Gracefully and previously decided to continue. Nichole Silva did not see the value in Aging Gracefully RN visit which is apart of the Aging Gracefully program. States they were more interested in the home repairs.   Goals Addressed               This Visit's Progress     COMPLETED: AG RN (pt-stated)        10/12/23  Assessment: Mrs. Mcmeans resides with her [name] who is also a client of Aging Gracefully program. Mrs. Woodbeck reports she goes to church on Mondays and Wednesdays. States she walks to her sister's house up the street from her. Reports walking independently without device. Denies any current pain. Reports chronic pain to left elbow due to injury and elbow surgery many years ago. Reports feeling like her elbow makes her off balance. Reports pain level 0 out of 10 during visit. Mrs. Roads has 2 cats in the home. Mrs. Macaluso reports she has not had any recent falls.   Interventions: Provided chronic conditions booklet and writer's contact information. Discussed outpatient therapy for balance control. Mrs. Pemberton reports she is not interested in outpatient therapy at this time. Discussed fall precautions and strategies.   Plan: Scheduled next home visit for June 26th at 2 pm.    CLIENT/RN ACTION PLAN - FALL PREVENTION  Registered Nurse:  [name] Date: 10/12/23  Client Name: Nichole Silva Client ID:    Target Area:  FALL PREVENTION    Why Problem May Occur: When off balance   Target Goal: No falls over the next 160 days  STRATEGIES Coping Strategies Ideas  Change Positions Slowly: lying to sitting, sitting to standing Reviewed 10/12/23 Reviewed 11/18/23  Changing positions can make people lightheaded. When getting up in the morning sit for a few minutes, before standing.  Stand for a few minutes before walking and hold on to sturdy furniture or countertop.    Drink water Reviewed  10/12/23 Reviewed 11/18/23 Reviewed 12/22/23 Dehydration can make people dizzy. Ask your healthcare provider how much water you can drink. Decrease caffeine, caffeine makes you urinate a lot, which can make you dehydrated.   If you fall, tell someone Reviewed 10/12/23 Reviewed 11/18/23 Tell a friend or family member even if you didn't get hurt. Tell your healthcare provider if you fall.  They can help you figure out why.   Get your vision and hearing checked Reviewed 10/12/23 Reviewed 11/18/23 Poor vision and hearing loss can make people fall. Glaucoma and diabetes can cause poor vision.   Other    Prevention Ideas  Review your Medicines Reviewed 10/12/23 Reviewed 11/18/23 Many medicines can make you dizzy or sleepy and increase your risk of falling. Your Aging Gracefully Nurse will look at your medications and see if you are taking any that might cause falling.   Activity and Exercise Reviewed 10/12/23 Reviewed 11/18/23- Aging Gracefully exercises demonstrated Reviewed 12/22/23 Aging Gracefully exercises Walking (inside or outside) [employer]:  cooking, Education officer, environmental, laundry Exercise while watching TV Swimming or water aerobics.   Strengthen Bones Reviewed 10/12/23 Reviewed 11/18/23 Exercise makes your bones stronger. Vitamin D  and Calcium  make your bones stronger.  Ask your Healthcare provider if you should be taking them. Your body makes vitamin D  from the sun.  Sit in the sun for 10 minutes every day (without sunscreen).   Control your urine  Reviewed 10/12/23 Reviewed 11/18/23 Prevent constipation Cut back on caffeinated drinks. Don't wait to urinate. If diabetic, control your blood sugar. Ask your Aging Gracefully Nurse and Healthcare Provider  about urine control.   Control your blood sugar (if you are diabetic) High blood sugar can cause frequent urination, poor vision, and numbness in your feet, which can make you fall.   Low blood sugar can cause confusion and  dizziness.   Other coping strategies 1. 2. 3. 4. 5.   PRACTICE It is important to practice the strategies so we can determine if they will be effective in helping to reach the goal.    Follow these specific recommendations:        If strategy does not work the first time, try it again.     We may make some changes over the next few sessions.       [name], MSN, RN, BSN [facility], Healthy Communities RN Case Manager for Aging Gracefully Direct Dial: [phone]     11/18/23  Assessment: Mrs. Ogan reports doing well during these hot days. States she stays indoors most of the time and has not been going out much except for MD appointment and church. Denies fany recent falls. Reports she continues to have chronic elbow pain from an elbow injury and surgery over 40 years ago. Endorses taking Tylenol , uses OTC pain patch, and elevation of left arm elbow with relief. Does not use assistive device but endorses walking and changing positions slowly.   Interventions:  Demonstrated Aging Gracefully home exercises. Mrs. Kuehnle performed return demonstration. Provided Aging Gracefully exercise booklet. Discussed fall precautions. Encouraged Mrs. Wales to watch her steps around cats and cords. Encouraged  Mrs. Urquilla to request home health referral from PCP. Encouraged Mrs. Tapp and [name] to perform home exercises together.   Plan: Scheduled next home visit for July 24 at 3 pm.   [name], MSN, RN, BSN [facility], Healthy Communities RN Case Manager for Aging Gracefully Direct Dial: [phone]     12/22/23  Assessment: Mrs. Gift reports she is doing well. Looking forward in going to the local [employer] for dancing classes for seniors. Nichole Silva (daughter) reports Mrs. Platner performs home exercises regularly.  Interventions: Complimented Mrs. Westrich in staying active and working on home exercises. Encouraged water  intake during these hot weather days.  Plan: Scheduled last home visit for 01/06/24  [name], MSN, RN, BSN [place]  [facility], Healthy Communities RN Case Manager for Aging Gracefully Direct Dial: [phone]    01/06/24  Assessment: Mrs. Catino denies pain or falls. States she continues to be active. States she has been staying home a little more lately since [employer] has been doing repairs to the home. [name] present during visit.  Interventions: Encouraged to continue home exercises. Discussed this is writer's last home visit.  Plan: Will alert AG team of writer's last visit.  Fall Prevention goal: Met  [name], MSN, RN, BSN [place]  [facility], Healthy Communities RN Case Manager for Aging Gracefully Direct Dial: [phone]

## 2024-01-18 ENCOUNTER — Telehealth: Payer: Self-pay

## 2024-01-18 NOTE — Telephone Encounter (Signed)
 Copied from CRM [id number]. Topic: Referral - Request for Referral >> Jan 18, 2024  3:36 PM Chiquita SQUIBB wrote: Did the patient discuss referral with their provider in the last year? Yes (If No - schedule appointment) (If Yes - send message)  Appointment offered? No  Type of order/referral and detailed reason for visit: Podiatry for a toe nail fungus   Preference of office, provider, location: [facility]   If referral order, have you been seen by this specialty before? No (If Yes, this issue or another issue? When? Where?  Can we respond through MyChart? Yes

## 2024-01-19 NOTE — Telephone Encounter (Signed)
 Can you place this referral? Or do she need an appointment?

## 2024-01-21 ENCOUNTER — Other Ambulatory Visit: Payer: Self-pay | Admitting: Internal Medicine

## 2024-01-26 ENCOUNTER — Ambulatory Visit: Admitting: Podiatry

## 2024-01-26 ENCOUNTER — Encounter: Payer: Self-pay | Admitting: Podiatry

## 2024-01-26 DIAGNOSIS — M79672 Pain in left foot: Secondary | ICD-10-CM | POA: Diagnosis not present

## 2024-01-26 DIAGNOSIS — M79671 Pain in right foot: Secondary | ICD-10-CM | POA: Diagnosis not present

## 2024-01-26 DIAGNOSIS — L600 Ingrowing nail: Secondary | ICD-10-CM | POA: Diagnosis not present

## 2024-01-26 DIAGNOSIS — B351 Tinea unguium: Secondary | ICD-10-CM | POA: Diagnosis not present

## 2024-01-26 NOTE — Progress Notes (Signed)
 Patient presents for evaluation and treatment of tenderness and some redness around nails feet.  Tenderness around toes with walking and wearing shoes.  His big toe nails been thick for several years now and were getting worse.  She is a diet-controlled diabetic.  Physical exam:  General appearance: Alert, pleasant, and in no acute distress.  Vascular: Pedal pulses: DP 2/4 B/L, PT 1/4 B/L.  Moderate edema lower legs bilaterally  Neurologic: Light touch intact bilaterally.  Normal Achilles tendon reflex bilaterally.  Dermatologic:  Nails thickened, disfigured, discolored 1-5 BL with subungual debris.  Redness and hypertrophic nail folds along nail folds bilaterally but no signs of drainage or infection.  Hallux nails are extremely thickened and dystrophic with hypertrophic nail folds.  Redness along nail folds.    Musculoskeletal:  Hammertoe deformities 2 through 5 bilaterally.  Second toe on the right partially overlapping the hallux.   Diagnosis: 1. Painful onychomycotic nails 1 through 5 bilaterally. 2. Pain toes 1 through 5 bilaterally. 3.  Ingrown hallux nails bilaterally  Plan: -New patient office visit for evaluation and management level 3.  Modifier 25. - Discussed with her the ingrown dystrophic hallux nails bilaterally.  Discussed that if these become more of a problem we may need to do a matrixectomy on them.  For this at this point would just recommend periodic debridement of the nails to prevent them from becoming infected.  -Debrided onychomycotic nails 1 through 5 bilaterally.  Sharply debrided nails with nail clipper and reduced with a power bur.  Return 3 months dfc

## 2024-01-31 ENCOUNTER — Other Ambulatory Visit: Payer: Self-pay | Admitting: Internal Medicine

## 2024-02-01 ENCOUNTER — Ambulatory Visit: Admitting: Cardiology

## 2024-02-18 ENCOUNTER — Other Ambulatory Visit: Payer: Self-pay | Admitting: Rehabilitation

## 2024-02-19 NOTE — Patient Outreach (Signed)
 Aging Gracefully Program  OT FINAL Visit  02/19/2024  Nichole Silva [birth year] [id number]  Visit:  4- Fourth Visit  Start Time:  1500 End Time:  1530 Total Minutes:  30  Readiness to Change:  Readiness to Change Score: 10  Durable Medical Equipment: Durable Medical Equipment: Shower Chair With Back, Other (toilet grab bars) Durable Medical Equipment Distribution Date: 02/18/24  Patient Education: Education Provided: Yes Education Details: Tips for Aging booklet. Reviewed how to tighten loose scre with toilet hand rail (tends to loosen and will need this from time to time) Person(s) Educated: Patient, Child(ren) Bobbetta, daughter) Comprehension: Verbalized Understanding  Goals:  Goals Addressed             This Visit's Progress    COMPLETED: AG OT Patient Stated       Improve safety and mobility in the bathroom 12/24/23: practice stepping into the shower using the grab bars, adjust shower seat height. Needs a resting place for the hand held shower, OT will address with [facility]. Raised toilet seat is also completed, but client is missing her hand rails. OT to order handrails.    OT ACTION PLAN: Bathing  Target Problem Area:  Safety in the shower   Why Problem May Occur:     New shower  Learning to manage shower seat, grab bars and HHS  Arm weakness     Target Goal(s):  Improve safety and mobility in the bathroom    STRATEGIES   Saving Your Energy DO:  Use a tub seat/adjusted for height today   Use a long-handled sponge- has one needs to be placed in the shower  Keep frequently used items within easy reach  (Other):use grab bars for balance and safety   (Other):   (Other):     Modifying your home environment and making it safe     DO:  Use grab bars in the shower and next to the toilet (OT to order the toilet handrail)  Do not need a rubber mat, the shower pan is non skid surface.  Make sure the bathroom is well-lit; use all lighting  (Other): pause and  take time transition in and out of shower.   (Other):Consider family member to assist as getting used to the new shower   (Other):    Simplifying the way you set up tasks or daily routines DO:  Plan to bathe/shower before you're overly tired  Gather everything you will need before beginning   (Other):    PRACTICE  Based on what we have talked about, you are willing to try:  ___shower seat in the new walk in shower  __grab bars: hold on to each one as stepping in and out of the shower.   If an idea does not work the first time, try it again (and again).  We may make some changes over the next few sessions, based on how they work.  02/18/24: goal is complete. No further modifications needed, client is able to safely access the new shower, seat, HHS, and all safety grab bars are utilized.      COMPLETED: AG OT Patient Stated       Improve safety and mobility in/out of the house 02/18/24: addition of permanent ramp to front door with zero entry. Goal is met     COMPLETED: AG OT Patient Stated       Improve mobility in/out of the car 11/12/23: OT demonstrate and issues car assist handle for trial with friend's car. Will follow up  next visit. 02/18/24: able to manage best in suv, no further concerns reported. Car assist handle issued if needed in the future.        Post Clinical Reasoning: Client Action (Goal) One Interventions: 02/18/24: goal is complete. Using shower seat, grab bars, HHA, taller commode is helpful and uses toilet hand rails. IS now able to shower and is thankful to do so. Did Client Try?: Yes Targeted Problem Area Status: A Lot Better Client Action (Goal) Two Interventions: 02/18/24: goal is complete. Has new ramp improving access in and out of the house. Did Client Try?: Yes Targeted Problem Area Status: A Lot Better Client Action (Goal) Three Interventions: 02/18/24: client is able to access the car, better in SUV. Did Client Try?: No Targeted Problem Area  Status: The Same Clinician View Of Client Situation:: Client is content with new shower and taller toilet seat. Needs family assist to tighten the hand rail on toilet, which seem to loosen over time. Client reports she is doing well and has no further concerns. Client View Of His/Her Situation:: Client is aware of her health and limitations. Able to discuss all the improvements being done in and around the house. Will continue to use family support which is a positive support her her. Next Visit Plan:: discharge from Aging Gracefully program.

## 2024-02-22 ENCOUNTER — Other Ambulatory Visit: Payer: Self-pay | Admitting: Internal Medicine

## 2024-02-22 DIAGNOSIS — I10 Essential (primary) hypertension: Secondary | ICD-10-CM

## 2024-02-27 ENCOUNTER — Other Ambulatory Visit: Payer: Self-pay | Admitting: Internal Medicine

## 2024-02-28 ENCOUNTER — Ambulatory Visit: Admitting: Internal Medicine

## 2024-03-01 ENCOUNTER — Other Ambulatory Visit: Payer: Medicare Other

## 2024-03-02 ENCOUNTER — Other Ambulatory Visit: Payer: Self-pay | Admitting: Internal Medicine

## 2024-03-02 DIAGNOSIS — E785 Hyperlipidemia, unspecified: Secondary | ICD-10-CM

## 2024-03-02 DIAGNOSIS — D520 Dietary folate deficiency anemia: Secondary | ICD-10-CM

## 2024-03-16 ENCOUNTER — Ambulatory Visit: Admitting: Internal Medicine

## 2024-03-16 ENCOUNTER — Encounter: Payer: Self-pay | Admitting: Internal Medicine

## 2024-03-16 ENCOUNTER — Ambulatory Visit: Payer: Self-pay | Admitting: Internal Medicine

## 2024-03-16 VITALS — BP 138/76 | HR 66 | Temp 97.9°F | Resp 16 | Ht 62.0 in | Wt 109.8 lb

## 2024-03-16 DIAGNOSIS — E785 Hyperlipidemia, unspecified: Secondary | ICD-10-CM | POA: Diagnosis not present

## 2024-03-16 DIAGNOSIS — D520 Dietary folate deficiency anemia: Secondary | ICD-10-CM | POA: Diagnosis not present

## 2024-03-16 DIAGNOSIS — I10 Essential (primary) hypertension: Secondary | ICD-10-CM | POA: Diagnosis not present

## 2024-03-16 DIAGNOSIS — Z23 Encounter for immunization: Secondary | ICD-10-CM

## 2024-03-16 DIAGNOSIS — R739 Hyperglycemia, unspecified: Secondary | ICD-10-CM | POA: Diagnosis not present

## 2024-03-16 LAB — BASIC METABOLIC PANEL WITH GFR
BUN: 15 mg/dL (ref 6–23)
CO2: 30 meq/L (ref 19–32)
Calcium: 9.9 mg/dL (ref 8.4–10.5)
Chloride: 102 meq/L (ref 96–112)
Creatinine, Ser: 0.76 mg/dL (ref 0.40–1.20)
GFR: 70.53 mL/min (ref >60.00)
Glucose, Bld: 98 mg/dL (ref 70–99)
Potassium: 4.1 meq/L (ref 3.5–5.1)
Sodium: 140 meq/L (ref 135–145)

## 2024-03-16 LAB — CBC WITH DIFFERENTIAL/PLATELET
Basophils Absolute: 0.1 K/uL (ref 0.0–0.1)
Basophils Relative: 0.7 % (ref 0.0–3.0)
Eosinophils Absolute: 0.3 K/uL (ref 0.0–0.7)
Eosinophils Relative: 3.6 % (ref 0.0–5.0)
HCT: 37.3 % (ref 36.0–46.0)
Hemoglobin: 12.1 g/dL (ref 12.0–15.0)
Lymphocytes Relative: 21.7 % (ref 12.0–46.0)
Lymphs Abs: 1.8 K/uL (ref 0.7–4.0)
MCHC: 32.4 g/dL (ref 30.0–36.0)
MCV: 96.6 fl (ref 78.0–100.0)
Monocytes Absolute: 0.8 K/uL (ref 0.1–1.0)
Monocytes Relative: 9.2 % (ref 3.0–12.0)
Neutro Abs: 5.3 K/uL (ref 1.4–7.7)
Neutrophils Relative %: 64.8 % (ref 43.0–77.0)
Platelets: 225 K/uL (ref 150.0–400.0)
RBC: 3.86 Mil/uL — ABNORMAL LOW (ref 3.87–5.11)
RDW: 13.3 % (ref 11.5–15.5)
WBC: 8.2 K/uL (ref 4.0–10.5)

## 2024-03-16 LAB — HEMOGLOBIN A1C: Hgb A1c MFr Bld: 5.7 % (ref 4.6–6.5)

## 2024-03-16 LAB — TSH: TSH: 1.74 u[IU]/mL (ref 0.35–5.50)

## 2024-03-16 MED ORDER — COVID-19 MRNA VAC-TRIS(PFIZER) 30 MCG/0.3ML IM SUSY: 0.3000 mL | PREFILLED_SYRINGE | Freq: Once | INTRAMUSCULAR | 0 refills | Status: AC

## 2024-03-16 NOTE — Progress Notes (Unsigned)
 Subjective:  Patient ID: Nichole Silva, female    DOB: 05-Sep-1934  Age: 89 y.o. MRN: [id number]  CC: Medical Management of Chronic Issues (6 month follow up )   HPI Nichole Silva presents for f/up   Discussed the use of AI scribe software for clinical note transcription with the patient, who gave verbal consent to proceed.  History of Present Illness Nichole Silva is an 89 year old female who presents with weight loss and allergy symptoms.  She estimates she may have lost about five pounds, but is unsure of the exact timeline as she has not been regularly checking her weight. Despite the weight loss, she feels better than she has in a long time. Her appetite is described as 'pretty good,' although she notes that food is not as important to her as it used to be. She consumes sweet foods but avoids greasy foods, which she has never preferred.  She experiences allergy symptoms, primarily a runny nose, which typically resolve by midday with the use of allergy medication. No coughing, fevers, chills, or night sweats.  She maintains an active lifestyle, attending church twice a week and engaging in various activities, although she does not perform housework herself and hires help for such tasks. There is no swelling in her legs or feet, which she notes used to be problematic but are not anymore. She mentions a past issue with a fungal infection on her big toes, which is currently being treated and appears to be improving.     Outpatient Medications Prior to Visit  Medication Sig Dispense Refill   acetaminophen  (TYLENOL ) 325 MG tablet Take 325 mg by mouth every 6 (six) hours as needed for mild pain or moderate pain. Pain/headache     atorvastatin  (LIPITOR) 40 MG tablet TAKE 1 TABLET(40 MG) BY MOUTH DAILY 90 tablet 0   cetirizine (ZYRTEC) 10 MG tablet Take 10 mg by mouth daily.     dicyclomine  (BENTYL ) 10 MG capsule Take 1 capsule (10 mg total) by mouth 3 (three) times daily before meals. 270  capsule 1   donepezil  (ARICEPT ) 5 MG tablet TAKE 1 TABLET(5 MG) BY MOUTH AT BEDTIME 90 tablet 0   fluticasone  (FLONASE ) 50 MCG/ACT nasal spray SHAKE LIQUID AND USE 2 SPRAYS IN EACH NOSTRIL AT BEDTIME 48 g 1   folic acid  (FOLVITE ) 1 MG tablet TAKE 1 TABLET(1 MG) BY MOUTH DAILY 90 tablet 1   losartan  (COZAAR ) 50 MG tablet TAKE 1 TABLET(50 MG) BY MOUTH DAILY 90 tablet 0   mirtazapine  (REMERON ) 15 MG tablet Take 1 tablet (15 mg total) by mouth at bedtime. 30 tablet 1   PROAIR  HFA 108 (90 Base) MCG/ACT inhaler INHALE 1 TO 2 PUFFS BY MOUTH EVERY 4 HOURS AS NEEDED FOR WHEEZE OR SHORTNESS OF BREATH 8.5 Inhaler 11   No facility-administered medications prior to visit.    ROS Review of Systems  Constitutional:  Negative for appetite change, chills, diaphoresis, fatigue and fever.  HENT:  Positive for postnasal drip and rhinorrhea. Negative for nosebleeds and sinus pressure.   Eyes: Negative.   Respiratory: Negative.  Negative for cough, chest tightness, shortness of breath and wheezing.   Cardiovascular:  Negative for chest pain, palpitations and leg swelling.  Gastrointestinal:  Negative for abdominal pain, constipation, diarrhea, nausea and vomiting.  Endocrine: Negative.   Genitourinary: Negative.  Negative for difficulty urinating.  Musculoskeletal: Negative.  Negative for arthralgias and myalgias.  Skin: Negative.   Neurological:  Negative for dizziness and weakness.  Hematological:  Negative for adenopathy. Does not bruise/bleed easily.  Psychiatric/Behavioral: Negative.      Objective:  BP 138/76 (BP Location: Right Arm, Patient Position: Sitting, Cuff Size: Normal)   Pulse 66   Temp 97.9 F (36.6 C) (Oral)   Resp 16   Ht 5' 2 (1.575 m)   Wt 109 lb 12.8 oz (49.8 kg)   SpO2 94%   BMI 20.08 kg/m   BP Readings from Last 3 Encounters:  03/16/24 138/76  09/21/23 111/71  08/26/23 126/64    Wt Readings from Last 3 Encounters:  03/16/24 109 lb 12.8 oz (49.8 kg)  08/26/23 109 lb  12.8 oz (49.8 kg)  08/11/23 111 lb (50.3 kg)    Physical Exam Vitals reviewed.  Constitutional:      Appearance: Normal appearance.  HENT:     Nose: Nose normal.     Mouth/Throat:     Mouth: Mucous membranes are moist.  Eyes:     General: No scleral icterus. Cardiovascular:     Rate and Rhythm: Normal rate and regular rhythm.     Heart sounds: No murmur heard.    No friction rub. No gallop.  Pulmonary:     Effort: Pulmonary effort is normal.     Breath sounds: No stridor. No wheezing, rhonchi or rales.  Abdominal:     General: Abdomen is flat.     Palpations: There is no mass.     Tenderness: There is no abdominal tenderness. There is no guarding.     Hernia: No hernia is present.  Musculoskeletal:        General: Normal range of motion.     Cervical back: Neck supple.     Right lower leg: No edema.     Left lower leg: No edema.  Lymphadenopathy:     Cervical: No cervical adenopathy.  Skin:    General: Skin is warm and dry.  Neurological:     General: No focal deficit present.     Mental Status: She is alert.  Psychiatric:        Mood and Affect: Mood normal.        Behavior: Behavior normal.     Lab Results  Component Value Date   WBC 8.2 03/16/2024   HGB 12.1 03/16/2024   HCT 37.3 03/16/2024   PLT 225.0 03/16/2024   GLUCOSE 98 03/16/2024   CHOL 153 08/26/2023   TRIG 136.0 08/26/2023   HDL 55.70 08/26/2023   LDLDIRECT 102.0 07/29/2015   LDLCALC 70 08/26/2023   ALT 10 08/26/2023   AST 18 08/26/2023   NA 140 03/16/2024   K 4.1 03/16/2024   CL 102 03/16/2024   CREATININE 0.76 03/16/2024   BUN 15 03/16/2024   CO2 30 03/16/2024   TSH 1.74 03/16/2024   HGBA1C 5.7 03/16/2024   MICROALBUR 13.1 12/07/2019    MM 3D SCREENING MAMMOGRAM BILATERAL BREAST Result Date: 07/14/2023 CLINICAL DATA:  Screening. EXAM: DIGITAL SCREENING BILATERAL MAMMOGRAM WITH TOMOSYNTHESIS AND CAD TECHNIQUE: Bilateral screening digital craniocaudal and mediolateral oblique  mammograms were obtained. Bilateral screening digital breast tomosynthesis was performed. The images were evaluated with computer-aided detection. COMPARISON:  Previous exam(s). ACR Breast Density Category b: There are scattered areas of fibroglandular density. FINDINGS: There are no findings suspicious for malignancy. IMPRESSION: No mammographic evidence of malignancy. A result letter of this screening mammogram will be mailed directly to the patient. RECOMMENDATION: Screening mammogram in one year. (Code:SM-B-01Y) BI-RADS CATEGORY  1: Negative. Electronically Signed   By: Alm Parkins  M.D.   On: 07/14/2023 10:56    Assessment & Plan:  Need for immunization against influenza -     Flu vaccine HIGH DOSE PF(Fluzone Trivalent)  Hyperlipidemia with target LDL less than 100- LDL goal achieved. Doing well on the statin  -     TSH; Future  Dietary folate deficiency anemia- H/H are normal. -     CBC with Differential/Platelet; Future  Essential hypertension- Her BP is well controlled. -     Basic metabolic panel with GFR; Future -     COVID-19 mRNA Vac-TriS(Pfizer); Inject 0.3 mLs into the muscle once for 1 dose.  Dispense: 0.3 mL; Refill: 0  Chronic hyperglycemia -     Basic metabolic panel with GFR; Future -     Hemoglobin A1c; Future     Follow-up: Return in about 6 months (around 09/14/2024).  Debby Molt, MD

## 2024-03-16 NOTE — Patient Instructions (Signed)

## 2024-03-27 ENCOUNTER — Other Ambulatory Visit: Payer: Self-pay | Admitting: Internal Medicine

## 2024-03-27 DIAGNOSIS — K589 Irritable bowel syndrome without diarrhea: Secondary | ICD-10-CM

## 2024-04-05 ENCOUNTER — Encounter: Payer: Self-pay | Admitting: Cardiology

## 2024-04-05 ENCOUNTER — Ambulatory Visit: Attending: Student in an Organized Health Care Education/Training Program | Admitting: Cardiology

## 2024-04-05 VITALS — BP 122/56 | HR 73 | Ht 62.0 in | Wt 112.0 lb

## 2024-04-05 DIAGNOSIS — I10 Essential (primary) hypertension: Secondary | ICD-10-CM | POA: Diagnosis not present

## 2024-04-05 DIAGNOSIS — I2510 Atherosclerotic heart disease of native coronary artery without angina pectoris: Secondary | ICD-10-CM

## 2024-04-05 NOTE — Patient Instructions (Signed)
 Medication Instructions:  The current medical regimen is effective;  continue present plan and medications.  *If you need a refill on your cardiac medications before your next appointment, please call your pharmacy*  Follow-Up: At [facility], you and your health needs are our priority.  As part of our continuing mission to provide you with exceptional heart care, our providers are all part of one team.  This team includes your primary Cardiologist (physician) and Advanced Practice Providers or APPs (Physician Assistants and Nurse Practitioners) who all work together to provide you with the care you need, when you need it.  Your next appointment:   2 year(s)  Provider:   Dr Oneil Parchment    We recommend signing up for the patient portal called MyChart.  Sign up information is provided on this After Visit Summary.  MyChart is used to connect with patients for Virtual Visits (Telemedicine).  Patients are able to view lab/test results, encounter notes, upcoming appointments, etc.  Non-urgent messages can be sent to your provider as well.   To learn more about what you can do with MyChart, go to [email or web address].

## 2024-04-05 NOTE — Progress Notes (Signed)
 Cardiology Office Note:  .   Date:  04/05/2024  ID:  [name], DOB 1934-11-22, MRN [id number] PCP: Joshua Debby CROME, MD  [facility] Providers Cardiologist:  None    History of Present Illness: .   Nichole Silva is a 89 y.o. female Discussed the use of AI scribe  History of Present Illness Nichole Silva is an 89 year old female with coronary artery disease who presents for evaluation of coronary artery disease.  She has a significant family history of heart disease, with a sister who had a myocardial infarction and multiple stents, and a daughter with two stents. She has undergone two stress tests in the past, both of which were unremarkable. Approximately 40 years ago, she underwent a cardiac catheterization which showed no coronary artery disease. No current chest pain or discomfort is reported.  Her past medical history includes diabetes, hypertension, and hyperlipidemia. Her most recent LDL was 70 mg/dL. She has experienced significant weight loss over the years, now weighing 112 pounds, and attributes this to being sick for a prolonged period. No swelling has been noted since losing weight.  In terms of social history, she has never smoked or consumed alcohol, although she was exposed to secondhand smoke from her husband and family, which she believes contributed to her asthma. She is widowed with four children and maintains an active lifestyle.  During the review of symptoms, she reports no pain, including no arthritis, and manages any discomfort with aspirin or Tylenol . She has a history of allergies, with frequent nasal dripping, but no other significant symptoms were reported.      Studies Reviewed: SABRA   EKG Interpretation Date/Time:  Wednesday April 05 2024 15:47:36 EST Ventricular Rate:  73 PR Interval:  190 QRS Duration:  88 QT Interval:  418 QTC Calculation: 460 R Axis:   -55  Text Interpretation: Normal sinus rhythm Left anterior fascicular block Septal  infarct , age undetermined When compared with ECG of 09-Jan-2021 07:59, No significant change since last tracing Confirmed by Jeffrie Anes ([id number]) on 04/05/2024 3:52:47 PM    Results LABS LDL: 70 mg/dL Creatinine: 0.7 mg/dL Hemoglobin: 87.8 g/dL  DIAGNOSTIC ECG: Normal Risk Assessment/Calculations:            Physical Exam:   VS:  BP (!) 122/56   Pulse 73   Ht 5' 2 (1.575 m)   Wt 112 lb (50.8 kg)   BMI 20.49 kg/m    Wt Readings from Last 3 Encounters:  04/05/24 112 lb (50.8 kg)  03/16/24 109 lb 12.8 oz (49.8 kg)  08/26/23 109 lb 12.8 oz (49.8 kg)    GEN: Thin in no acute distress NECK: No JVD; No carotid bruits CARDIAC: RRR, no murmurs, no rubs, no gallops RESPIRATORY:  Clear to auscultation without rales, wheezing or rhonchi  ABDOMEN: Soft, non-tender, non-distended EXTREMITIES:  No edema; No deformity   ASSESSMENT AND PLAN: .    Assessment and Plan Assessment & Plan Coronary artery disease evaluation (no current symptomatic evidence of CAD) No current evidence of coronary artery disease. Previous heart catheterization at age 89 showed no CAD (Dr. Morris). Two unremarkable stress tests. Family history of heart disease, including sister with myocardial infarction and daughter with stents. No current chest pain or cardiac symptoms. EKG and heart sounds are normal. - Continue current management and lifestyle modifications.   Hypertension  Type 2 diabetes mellitus without complications  Hyperlipidemia Cholesterol levels are well-controlled with current treatment. LDL is at 70 mg/dL. - Continue  current lipid-lowering therapy.  Asthma  Allergic rhinitis Chronic allergic rhinitis with nasal symptoms. No acute changes.  Hearing loss Chronic hearing loss. No acute changes. - Continue current management for hearing loss.         Dispo: 2 yr follow up  Signed, Oneil Parchment, MD

## 2024-04-26 ENCOUNTER — Ambulatory Visit: Admitting: Podiatry

## 2024-04-26 DIAGNOSIS — M79671 Pain in right foot: Secondary | ICD-10-CM | POA: Diagnosis not present

## 2024-04-26 DIAGNOSIS — M79672 Pain in left foot: Secondary | ICD-10-CM | POA: Diagnosis not present

## 2024-04-26 DIAGNOSIS — B351 Tinea unguium: Secondary | ICD-10-CM

## 2024-04-26 NOTE — Progress Notes (Signed)
 Patient presents for evaluation and treatment of tenderness and some redness around nails feet.  Tenderness around toes with walking and wearing shoes.  Physical exam:  General appearance: Alert, pleasant, and in no acute distress.  Vascular: Pedal pulses: DP 2/4 B/L, PT 1/4 B/L. Moderate edema lower legs bilaterally.  Capillary refill time immediate bilaterally  Neurologic:  Dermatologic:  Nails thickened, disfigured, discolored 1-5 BL with subungual debris.  Redness and hypertrophic nail folds along nail folds bilaterally but no signs of drainage or infection.  Musculoskeletal:     Diagnosis: 1. Painful onychomycotic nails 1 through 5 bilaterally. 2. Pain toes 1 through 5 bilaterally.  Plan: -Debrided onychomycotic nails 1 through 5 bilaterally.  Sharply debrided nails with nail clipper and reduced with a power bur.  Return 3 months [facility]

## 2024-05-09 ENCOUNTER — Telehealth: Payer: Self-pay

## 2024-05-09 NOTE — Telephone Encounter (Signed)
 Copied from CRM [id number]. Topic: General - Other >> May 09, 2024 11:33 AM Deleta RAMAN wrote: Reason for CRM: patient would like to know if labs are needed for April visit if so she would like to reschedule or do labs before please contact [phone]

## 2024-05-10 NOTE — Telephone Encounter (Signed)
 Unable to reach patient. LMTRC

## 2024-05-10 NOTE — Telephone Encounter (Signed)
 Patient has been made aware that she will have her labs done at her visit and she doesn't have to fast.

## 2024-05-22 ENCOUNTER — Other Ambulatory Visit: Payer: Self-pay | Admitting: Internal Medicine

## 2024-05-22 DIAGNOSIS — I10 Essential (primary) hypertension: Secondary | ICD-10-CM

## 2024-05-26 ENCOUNTER — Telehealth: Payer: Self-pay

## 2024-05-26 NOTE — Telephone Encounter (Signed)
 Copied from CRM [id number]. Topic: Appointments - Appointment Scheduling >> May 26, 2024 11:27 AM Tonda B wrote: Patient/patient representative is calling to schedule an appointment. Refer to attachments for appointment information.  Patient daughter is calling has questions about her rx losartan  (COZAAR ) 50 MG tablet  please call pt daughter ms morris [phone]

## 2024-05-30 ENCOUNTER — Other Ambulatory Visit: Payer: Self-pay | Admitting: Internal Medicine

## 2024-05-30 DIAGNOSIS — E785 Hyperlipidemia, unspecified: Secondary | ICD-10-CM

## 2024-05-31 NOTE — Telephone Encounter (Signed)
 Medication was refilled.

## 2024-06-05 ENCOUNTER — Encounter: Payer: Self-pay | Admitting: *Deleted

## 2024-07-25 ENCOUNTER — Ambulatory Visit: Admitting: Podiatry

## 2024-09-05 ENCOUNTER — Ambulatory Visit: Admitting: Internal Medicine

## 2024-09-06 ENCOUNTER — Ambulatory Visit: Admitting: Internal Medicine
